# Patient Record
Sex: Female | Born: 1945
Health system: Southern US, Community
[De-identification: ages and names within clinical notes are randomized; demographics above are authoritative.]

## PROBLEM LIST (undated history)

## (undated) DIAGNOSIS — N200 Calculus of kidney: Secondary | ICD-10-CM

## (undated) DIAGNOSIS — Z8679 Personal history of other diseases of the circulatory system: Secondary | ICD-10-CM

## (undated) DIAGNOSIS — I495 Sick sinus syndrome: Secondary | ICD-10-CM

## (undated) DIAGNOSIS — I639 Cerebral infarction, unspecified: Secondary | ICD-10-CM

## (undated) DIAGNOSIS — I10 Essential (primary) hypertension: Secondary | ICD-10-CM

## (undated) DIAGNOSIS — K219 Gastro-esophageal reflux disease without esophagitis: Secondary | ICD-10-CM

## (undated) DIAGNOSIS — E119 Type 2 diabetes mellitus without complications: Secondary | ICD-10-CM

## (undated) DIAGNOSIS — T7840XA Allergy, unspecified, initial encounter: Secondary | ICD-10-CM

## (undated) DIAGNOSIS — D126 Benign neoplasm of colon, unspecified: Secondary | ICD-10-CM

## (undated) DIAGNOSIS — Z91148 Patient's other noncompliance with medication regimen for other reason: Secondary | ICD-10-CM

## (undated) DIAGNOSIS — I5042 Chronic combined systolic (congestive) and diastolic (congestive) heart failure: Secondary | ICD-10-CM

## (undated) DIAGNOSIS — E785 Hyperlipidemia, unspecified: Secondary | ICD-10-CM

## (undated) DIAGNOSIS — Z9114 Patient's other noncompliance with medication regimen: Secondary | ICD-10-CM

## (undated) DIAGNOSIS — I480 Paroxysmal atrial fibrillation: Secondary | ICD-10-CM

## (undated) DIAGNOSIS — E669 Obesity, unspecified: Secondary | ICD-10-CM

## (undated) HISTORY — DX: Hyperlipidemia, unspecified: E78.5

## (undated) HISTORY — PX: CHOLECYSTECTOMY: SHX55

## (undated) HISTORY — PX: INSERT / REPLACE / REMOVE PACEMAKER: SUR710

## (undated) HISTORY — DX: Cerebral infarction, unspecified: I63.9

## (undated) HISTORY — DX: Gastro-esophageal reflux disease without esophagitis: K21.9

## (undated) HISTORY — PX: ENDOMETRIAL ABLATION: SHX621

## (undated) HISTORY — DX: Calculus of kidney: N20.0

## (undated) HISTORY — DX: Benign neoplasm of colon, unspecified: D12.6

## (undated) HISTORY — PX: DESTRUCTION TRIGEMINAL NERVE VIA NEUROLYTIC AGENT: SUR416

## (undated) HISTORY — DX: Allergy, unspecified, initial encounter: T78.40XA

## (undated) HISTORY — DX: Essential (primary) hypertension: I10

## (undated) HISTORY — PX: TUBAL LIGATION: SHX77

## (undated) HISTORY — DX: Personal history of other diseases of the circulatory system: Z86.79

---

## 1998-08-16 ENCOUNTER — Encounter: Payer: Self-pay | Admitting: Family Medicine

## 2001-03-30 ENCOUNTER — Emergency Department (HOSPITAL_COMMUNITY): Admission: EM | Admit: 2001-03-30 | Discharge: 2001-03-30 | Payer: Self-pay | Admitting: Emergency Medicine

## 2001-03-30 ENCOUNTER — Encounter: Payer: Self-pay | Admitting: Emergency Medicine

## 2003-01-05 ENCOUNTER — Encounter: Payer: Self-pay | Admitting: Family Medicine

## 2005-02-18 ENCOUNTER — Other Ambulatory Visit: Payer: Self-pay

## 2005-02-18 ENCOUNTER — Emergency Department: Payer: Self-pay | Admitting: Emergency Medicine

## 2006-02-19 ENCOUNTER — Encounter: Payer: Self-pay | Admitting: Family Medicine

## 2006-05-28 ENCOUNTER — Ambulatory Visit: Payer: Self-pay | Admitting: Oral Surgery

## 2007-09-13 ENCOUNTER — Encounter: Payer: Self-pay | Admitting: Family Medicine

## 2008-04-10 ENCOUNTER — Encounter: Payer: Self-pay | Admitting: Family Medicine

## 2008-05-04 ENCOUNTER — Ambulatory Visit: Payer: Self-pay | Admitting: Family Medicine

## 2008-05-04 DIAGNOSIS — Z780 Asymptomatic menopausal state: Secondary | ICD-10-CM | POA: Insufficient documentation

## 2008-05-04 DIAGNOSIS — I10 Essential (primary) hypertension: Secondary | ICD-10-CM

## 2008-05-04 DIAGNOSIS — J45909 Unspecified asthma, uncomplicated: Secondary | ICD-10-CM | POA: Insufficient documentation

## 2008-05-04 DIAGNOSIS — E1159 Type 2 diabetes mellitus with other circulatory complications: Secondary | ICD-10-CM

## 2008-05-04 DIAGNOSIS — K219 Gastro-esophageal reflux disease without esophagitis: Secondary | ICD-10-CM

## 2008-05-04 DIAGNOSIS — J309 Allergic rhinitis, unspecified: Secondary | ICD-10-CM | POA: Insufficient documentation

## 2008-05-04 DIAGNOSIS — E785 Hyperlipidemia, unspecified: Secondary | ICD-10-CM

## 2008-05-23 DIAGNOSIS — D126 Benign neoplasm of colon, unspecified: Secondary | ICD-10-CM

## 2008-05-23 HISTORY — DX: Benign neoplasm of colon, unspecified: D12.6

## 2008-06-01 ENCOUNTER — Ambulatory Visit: Payer: Self-pay | Admitting: Gastroenterology

## 2008-06-08 ENCOUNTER — Ambulatory Visit: Payer: Self-pay | Admitting: Family Medicine

## 2008-06-08 LAB — CONVERTED CEMR LAB: LDL Cholesterol: 186 mg/dL

## 2008-06-09 LAB — CONVERTED CEMR LAB
ALT: 25 units/L (ref 0–35)
AST: 20 units/L (ref 0–37)
Albumin: 4.1 g/dL (ref 3.5–5.2)
Alkaline Phosphatase: 70 units/L (ref 39–117)
BUN: 16 mg/dL (ref 6–23)
Basophils Absolute: 0 10*3/uL (ref 0.0–0.1)
Basophils Relative: 0.5 % (ref 0.0–3.0)
Bilirubin, Direct: 0 mg/dL (ref 0.0–0.3)
CO2: 29 meq/L (ref 19–32)
Calcium: 9.1 mg/dL (ref 8.4–10.5)
Chloride: 110 meq/L (ref 96–112)
Cholesterol: 262 mg/dL — ABNORMAL HIGH (ref 0–200)
Creatinine, Ser: 0.8 mg/dL (ref 0.4–1.2)
Direct LDL: 186.8 mg/dL
Eosinophils Absolute: 0.1 10*3/uL (ref 0.0–0.7)
Eosinophils Relative: 2.1 % (ref 0.0–5.0)
GFR calc non Af Amer: 77.03 mL/min (ref >60)
Glucose, Bld: 121 mg/dL — ABNORMAL HIGH (ref 70–99)
HCT: 44 % (ref 36.0–46.0)
HDL: 41.7 mg/dL (ref >39.00)
Hemoglobin: 15.2 g/dL — ABNORMAL HIGH (ref 12.0–15.0)
Hgb A1c MFr Bld: 6.5 % (ref 4.6–6.5)
Lymphocytes Relative: 35.2 % (ref 12.0–46.0)
Lymphs Abs: 2 10*3/uL (ref 0.7–4.0)
MCHC: 34.6 g/dL (ref 30.0–36.0)
MCV: 91.1 fL (ref 78.0–100.0)
Monocytes Absolute: 0.4 10*3/uL (ref 0.1–1.0)
Monocytes Relative: 6.9 % (ref 3.0–12.0)
Neutro Abs: 3.3 10*3/uL (ref 1.4–7.7)
Neutrophils Relative %: 55.3 % (ref 43.0–77.0)
Platelets: 231 10*3/uL (ref 150.0–400.0)
Potassium: 4.1 meq/L (ref 3.5–5.1)
RBC: 4.83 M/uL (ref 3.87–5.11)
RDW: 12.1 % (ref 11.5–14.6)
Sodium: 143 meq/L (ref 135–145)
TSH: 1.35 microintl units/mL (ref 0.35–5.50)
Total Bilirubin: 1 mg/dL (ref 0.3–1.2)
Total CHOL/HDL Ratio: 6
Total Protein: 7 g/dL (ref 6.0–8.3)
Triglycerides: 228 mg/dL — ABNORMAL HIGH (ref 0.0–149.0)
VLDL: 45.6 mg/dL — ABNORMAL HIGH (ref 0.0–40.0)
WBC: 5.8 10*3/uL (ref 4.5–10.5)

## 2008-06-15 ENCOUNTER — Ambulatory Visit: Payer: Self-pay | Admitting: Gastroenterology

## 2008-06-15 ENCOUNTER — Encounter: Payer: Self-pay | Admitting: Gastroenterology

## 2008-06-15 LAB — HM COLONOSCOPY

## 2008-06-17 ENCOUNTER — Encounter: Payer: Self-pay | Admitting: Gastroenterology

## 2008-06-29 ENCOUNTER — Ambulatory Visit: Payer: Self-pay | Admitting: Family Medicine

## 2008-06-29 ENCOUNTER — Encounter: Payer: Self-pay | Admitting: Family Medicine

## 2008-07-02 ENCOUNTER — Encounter (INDEPENDENT_AMBULATORY_CARE_PROVIDER_SITE_OTHER): Payer: Self-pay | Admitting: *Deleted

## 2008-07-02 ENCOUNTER — Encounter: Payer: Self-pay | Admitting: Family Medicine

## 2008-07-02 LAB — HM MAMMOGRAPHY

## 2008-08-10 ENCOUNTER — Ambulatory Visit: Payer: Self-pay | Admitting: Family Medicine

## 2008-08-10 LAB — CONVERTED CEMR LAB
Cholesterol, target level: 200 mg/dL
HDL goal, serum: 40 mg/dL
LDL Goal: 100 mg/dL

## 2008-09-03 ENCOUNTER — Telehealth: Payer: Self-pay | Admitting: Family Medicine

## 2009-01-25 ENCOUNTER — Encounter: Payer: Self-pay | Admitting: Family Medicine

## 2009-02-08 ENCOUNTER — Encounter (INDEPENDENT_AMBULATORY_CARE_PROVIDER_SITE_OTHER): Payer: Self-pay | Admitting: *Deleted

## 2009-03-08 ENCOUNTER — Ambulatory Visit: Payer: Self-pay | Admitting: Family Medicine

## 2009-03-08 LAB — CONVERTED CEMR LAB
ALT: 31 units/L (ref 0–35)
AST: 21 units/L (ref 0–37)
Albumin: 4.2 g/dL (ref 3.5–5.2)
Alkaline Phosphatase: 77 units/L (ref 39–117)
BUN: 14 mg/dL (ref 6–23)
Bilirubin, Direct: 0.1 mg/dL (ref 0.0–0.3)
CO2: 28 meq/L (ref 19–32)
Calcium: 9.2 mg/dL (ref 8.4–10.5)
Chloride: 107 meq/L (ref 96–112)
Cholesterol: 245 mg/dL — ABNORMAL HIGH (ref 0–200)
Creatinine, Ser: 0.8 mg/dL (ref 0.4–1.2)
Direct LDL: 167.1 mg/dL
GFR calc non Af Amer: 76.85 mL/min (ref >60)
Glucose, Bld: 142 mg/dL — ABNORMAL HIGH (ref 70–99)
HDL: 48.6 mg/dL (ref >39.00)
Hgb A1c MFr Bld: 6.7 % — ABNORMAL HIGH (ref 4.6–6.5)
Potassium: 4.3 meq/L (ref 3.5–5.1)
Sodium: 141 meq/L (ref 135–145)
Total Bilirubin: 0.5 mg/dL (ref 0.3–1.2)
Total CHOL/HDL Ratio: 5
Total Protein: 6.8 g/dL (ref 6.0–8.3)
Triglycerides: 201 mg/dL — ABNORMAL HIGH (ref 0.0–149.0)
VLDL: 40.2 mg/dL — ABNORMAL HIGH (ref 0.0–40.0)

## 2009-03-15 ENCOUNTER — Ambulatory Visit: Payer: Self-pay | Admitting: Family Medicine

## 2009-03-16 LAB — CONVERTED CEMR LAB: TSH: 1.32 microintl units/mL (ref 0.35–5.50)

## 2009-04-05 ENCOUNTER — Ambulatory Visit: Payer: Self-pay | Admitting: Family Medicine

## 2009-05-31 ENCOUNTER — Encounter: Payer: Self-pay | Admitting: Family Medicine

## 2009-06-17 ENCOUNTER — Ambulatory Visit: Payer: Self-pay | Admitting: Family Medicine

## 2009-09-20 ENCOUNTER — Ambulatory Visit: Payer: Self-pay | Admitting: Family Medicine

## 2009-09-20 LAB — CONVERTED CEMR LAB
ALT: 28 units/L (ref 0–35)
AST: 20 units/L (ref 0–37)
Albumin: 4.3 g/dL (ref 3.5–5.2)
Alkaline Phosphatase: 69 units/L (ref 39–117)
Bilirubin, Direct: 0.1 mg/dL (ref 0.0–0.3)
Cholesterol: 276 mg/dL — ABNORMAL HIGH (ref 0–200)
Creatinine,U: 117.4 mg/dL
Direct LDL: 201.4 mg/dL
HDL: 49.5 mg/dL (ref >39.00)
Hgb A1c MFr Bld: 6.9 % — ABNORMAL HIGH (ref 4.6–6.5)
Microalb Creat Ratio: 1.8 mg/g (ref 0.0–30.0)
Microalb, Ur: 2.1 mg/dL — ABNORMAL HIGH (ref 0.0–1.9)
Total Bilirubin: 0.8 mg/dL (ref 0.3–1.2)
Total CHOL/HDL Ratio: 6
Total Protein: 6.7 g/dL (ref 6.0–8.3)
Triglycerides: 192 mg/dL — ABNORMAL HIGH (ref 0.0–149.0)
VLDL: 38.4 mg/dL (ref 0.0–40.0)

## 2009-10-04 ENCOUNTER — Ambulatory Visit: Payer: Self-pay | Admitting: Family Medicine

## 2010-01-03 ENCOUNTER — Ambulatory Visit: Payer: Self-pay | Admitting: Family Medicine

## 2010-01-10 ENCOUNTER — Ambulatory Visit: Payer: Self-pay | Admitting: Family Medicine

## 2010-01-26 ENCOUNTER — Ambulatory Visit
Admission: RE | Admit: 2010-01-26 | Discharge: 2010-01-26 | Payer: Self-pay | Source: Home / Self Care | Attending: Family Medicine | Admitting: Family Medicine

## 2010-01-26 ENCOUNTER — Other Ambulatory Visit: Payer: Self-pay | Admitting: Family Medicine

## 2010-01-26 DIAGNOSIS — R5383 Other fatigue: Secondary | ICD-10-CM

## 2010-01-26 DIAGNOSIS — R5381 Other malaise: Secondary | ICD-10-CM | POA: Insufficient documentation

## 2010-01-26 DIAGNOSIS — G500 Trigeminal neuralgia: Secondary | ICD-10-CM | POA: Insufficient documentation

## 2010-01-26 LAB — TSH: TSH: 1.42 u[IU]/mL (ref 0.35–5.50)

## 2010-01-26 LAB — BASIC METABOLIC PANEL
BUN: 11 mg/dL (ref 6–23)
CO2: 26 mEq/L (ref 19–32)
Calcium: 10.1 mg/dL (ref 8.4–10.5)
Chloride: 104 mEq/L (ref 96–112)
Creatinine, Ser: 0.8 mg/dL (ref 0.4–1.2)
GFR: 75.54 mL/min (ref >60.00)
Glucose, Bld: 147 mg/dL — ABNORMAL HIGH (ref 70–99)
Potassium: 4.4 mEq/L (ref 3.5–5.1)
Sodium: 140 mEq/L (ref 135–145)

## 2010-01-26 LAB — HEMOGLOBIN A1C: Hgb A1c MFr Bld: 7 % — ABNORMAL HIGH (ref 4.6–6.5)

## 2010-02-09 ENCOUNTER — Ambulatory Visit
Admission: RE | Admit: 2010-02-09 | Discharge: 2010-02-09 | Payer: Self-pay | Source: Home / Self Care | Attending: Family Medicine | Admitting: Family Medicine

## 2010-02-22 NOTE — Assessment & Plan Note (Signed)
Summary: 6 MTH FU/CLE   Vital Signs:  Patient profile:   65 year old female Height:      63 inches Weight:      191.2 pounds BMI:     33.99 Temp:     98.6 degrees F oral Pulse rate:   80 / minute Pulse rhythm:   regular BP sitting:   120 / 78  (left arm) Cuff size:   regular  Vitals Entered By: Benny Lennert CMA Duncan Dull) (March 15, 2009 9:45 AM)  History of Present Illness: Chief complaint 6 month follow  up  HTN at goal  Lipids: never filled meds, unstable, LDL 160  DM: a1c - 6.7 doing ok, no highs or low, no probs overall  some fatigue and sweats  ROS: GEN: No acute illnesses, no fevers, chills,. GI: No n/v/d Pulm: No SOB, cough, wheezing Interactive and getting along well at home.  Otherwise, ROS is as per the HPI.     Allergies: 1)  ! Pcn 2)  ! * Shellfish 3)  ! * Novicain  Past History:  Past medical, surgical, family and social histories (including risk factors) reviewed, and no changes noted (except as noted below).  Past Medical History: Reviewed history from 05/12/2008 and no changes required. Allergic rhinitis Asthma Diabetes mellitus, type II (borderline) GERD Hyperlipidemia Hypertension Heart murmur Kidney stones Rheumatic Rever Ectopic Pregnancy, 1980's, R TMJ  Past Surgical History: Reviewed history from 05/04/2008 and no changes required. Gall Bladder (2000) Fallopian Tube, R, removal, 1980's  Family History: Reviewed history from 05/04/2008 and no changes required. Family History Diabetes 1st degree relative (parent, gransparent) Family History Hypertension (parent) Family History of Stroke F 1st degree relative (mother) M, CVA, d/c at 36 GM, CVA Father alive, 46, no contact  Social History: Reviewed history from 05/04/2008 and no changes required. Occupation: self employed Set designer Married, 2 children, 3 grandchildren Never Smoked Alcohol use-yes Drug use-no Regular exercise-no  Physical Exam  Additional  Exam:  GEN: WDWN, NAD, Non-toxic, A & O x 3 HEENT: Atraumatic, Normocephalic. Neck supple. No masses, No LAD. Ears and Nose: No external deformity. CV: RRR, No M/G/R. No JVD. No thrill. No extra heart sounds. PULM: CTA B, no wheezes, crackles, rhonchi. No retractions. No resp. distress. No accessory muscle use. EXTR: No c/c/e NEURO: Normal gait.  PSYCH: Normally interactive. Conversant. Not depressed or anxious appearing.  Calm demeanor.     Impression & Recommendations:  Problem # 1:  DIABETES MELLITUS, TYPE II (ICD-250.00) Assessment Unchanged  Labs Reviewed: Creat: 0.8 (03/08/2009)    Reviewed HgBA1c results: 6.7 (03/08/2009)  6.5 (06/08/2008)  Problem # 2:  HYPERLIPIDEMIA (ICD-272.4) Assessment: Deteriorated will start low dose pravachol  Her updated medication list for this problem includes:    Pravastatin Sodium 10 Mg Tabs (Pravastatin sodium) ..... One by mouth at bedtime  Labs Reviewed: SGOT: 21 (03/08/2009)   SGPT: 31 (03/08/2009)  Lipid Goals: Chol Goal: 200 (08/10/2008)   HDL Goal: 40 (08/10/2008)   LDL Goal: 100 (08/10/2008)   TG Goal: 150 (08/10/2008)  Prior 10 Yr Risk Heart Disease: > 32 % (08/10/2008)   HDL:48.60 (03/08/2009), 41.70 (06/08/2008)  LDL:186 (06/08/2008)  Chol:245 (03/08/2009), 262 (06/08/2008)  Trig:201.0 (03/08/2009), 228.0 (06/08/2008)  Problem # 3:  HYPERTENSION (ICD-401.9) at goal  Her updated medication list for this problem includes:    Toprol Xl 25 Mg Xr24h-tab (Metoprolol succinate) .Marland Kitchen... 1 by mouth daily  Problem # 4:  OTHER MALAISE AND FATIGUE (ICD-780.79)  Orders: Venipuncture ([address]) TLB-TSH (Thyroid  Stimulating Hormone) (84443-TSH)  Complete Medication List: 1)  Nexium 40 Mg Cpdr (Esomeprazole magnesium) .... Take 1 tablet by mouth once a day 2)  Toprol Xl 25 Mg Xr24h-tab (Metoprolol succinate) .Marland Kitchen.. 1 by mouth daily 3)  Singulair 10 Mg Tabs (Montelukast sodium) .... Take 1 tablet by mouth once a day 4)  Flexeril 10 Mg Tabs  (Cyclobenzaprine hcl) .Marland Kitchen.. 1 by mouth three times a day as needed muscle spasm 5)  Librax 2.5-5 Mg Caps (Clidinium-chlordiazepoxide) .Marland Kitchen.. 1 by mouth q am as needed 6)  Allegra 180 Mg Tabs (Fexofenadine hcl) .Marland Kitchen.. 1 by mouth daily (failed zyrtec and claritin) 7)  Proair Hfa 108 (90 Base) Mcg/act Aers (Albuterol sulfate) .... 2 inh q4h as needed shortness of breath 8)  Pravastatin Sodium 10 Mg Tabs (Pravastatin sodium) .... One by mouth at bedtime  Patient Instructions: 1)  f/u 6 months 2)  FLP, HFP, Hga1c, urine microalbumin before 3-4 days: 250.00, 272.4 Prescriptions: PRAVASTATIN SODIUM 10 MG  TABS (PRAVASTATIN SODIUM) one by mouth at bedtime  #30 x 11   Entered and Authorized by:   [name] MD   Signed by:   [name] MD on 03/15/2009   Method used:   Print then Give to Patient   RxID:   [id number]   Current Allergies (reviewed today): ! PCN ! * SHELLFISH ! * NOVICAIN

## 2010-02-22 NOTE — Assessment & Plan Note (Signed)
Summary: URI   Vital Signs:  Patient profile:   65 year old female Height:      63 inches Weight:      195.0 pounds BMI:     34.67 Temp:     98.3 degrees F oral Pulse rate:   80 / minute Pulse rhythm:   regular BP sitting:   140 / 80  (left arm) Cuff size:   regular  Vitals Entered By: Benny Lennert CMA Duncan Dull) (April 05, 2009 2:44 PM)  History of Present Illness: Chief complaint ?uri  65 year old:  Doing better now, last week was feeling something going on. Took some mucinex, has been getting better. Was hurting in chest, back, joints.   Had some temp.   Acute Visit History:      The patient complains of chest pain, cough, fever, headache, musculoskeletal symptoms, nasal discharge, sinus problems, and sore throat.  She denies abdominal pain, constipation, and vomiting.        The cough interferes with her sleep.  The character of the cough is described as productive.        She complains of sinus pressure and nasal congestion.  The patient has had a past history of sinusitis.        Urine output has been normal.  She is tolerating clear liquids.        Allergies: 1)  ! Pcn 2)  ! * Shellfish 3)  ! * Novicain  Past History:  Past medical, surgical, family and social histories (including risk factors) reviewed, and no changes noted (except as noted below).  Past Medical History: Reviewed history from 05/12/2008 and no changes required. Allergic rhinitis Asthma Diabetes mellitus, type II (borderline) GERD Hyperlipidemia Hypertension Heart murmur Kidney stones Rheumatic Rever Ectopic Pregnancy, 1980's, R TMJ  Past Surgical History: Reviewed history from 05/04/2008 and no changes required. Gall Bladder (2000) Fallopian Tube, R, removal, 1980's  Family History: Reviewed history from 05/04/2008 and no changes required. Family History Diabetes 1st degree relative (parent, gransparent) Family History Hypertension (parent) Family History of Stroke F 1st degree  relative (mother) M, CVA, d/c at 26 GM, CVA Father alive, 60, no contact  Social History: Reviewed history from 05/04/2008 and no changes required. Occupation: self employed Set designer Married, 2 children, 3 grandchildren Never Smoked Alcohol use-yes Drug use-no Regular exercise-no  Review of Systems       REVIEW OF SYSTEMS GEN: Acute illness details above. CV: No chest pain or SOB GI: No noted N or V Otherwise, pertinent positives and negatives are noted in the HPI.   Physical Exam  Additional Exam:  Gen: WDWN, NAD; A & O x3, cooperative. Pleasant.Globally Non-toxic HEENT: Normocephalic and atraumatic. Throat clear, w/o exudate, R TM clear, L TM - good landmarks, No fluid present. rhinnorhea. No frontal or maxillary sinus T. MMM NECK: Anterior cervical  LAD is present CV: RRR, No M/G/R, cap refill <2 sec PULM: Breathing comfortably in no respiratory distress. no wheezing, crackles, rhonchi ABD: S,NT,ND,+BS. No HSM. No rebound. EXT: No c/c/e PSYCH: Friendly, good eye contact MSK: Nml gait    Impression & Recommendations:  Problem # 1:  INFLUENZA WITH OTHER RESPIRATORY MANIFESTATIONS (ICD-487.1) Assessment New The patient's clinical exam and history is consistent with a diagnosis of influenza, resolving  Supportive care. Fluids. Cough medicines as needed  Anti-pyretics. Detailed in p/i  Infection control emphasized, including OOW or school until AF 24 hours.   Problem # 2:  COUGH (ICD-786.2) Assessment: New Tussionex as needed  at night  Complete Medication List: 1)  Nexium 40 Mg Cpdr (Esomeprazole magnesium) .... Take 1 tablet by mouth once a day 2)  Toprol Xl 25 Mg Xr24h-tab (Metoprolol succinate) .Marland Kitchen.. 1 by mouth daily 3)  Singulair 10 Mg Tabs (Montelukast sodium) .... Take 1 tablet by mouth once a day 4)  Flexeril 10 Mg Tabs (Cyclobenzaprine hcl) .Marland Kitchen.. 1 by mouth three times a day as needed muscle spasm 5)  Librax 2.5-5 Mg Caps (Clidinium-chlordiazepoxide)  .Marland Kitchen.. 1 by mouth q am as needed 6)  Allegra 180 Mg Tabs (Fexofenadine hcl) .Marland Kitchen.. 1 by mouth daily (failed zyrtec and claritin) 7)  Proair Hfa 108 (90 Base) Mcg/act Aers (Albuterol sulfate) .... 2 inh q4h as needed shortness of breath 8)  Pravastatin Sodium 10 Mg Tabs (Pravastatin sodium) .... One by mouth at bedtime 9)  Tussionex Pennkinetic Er 8-10 Mg/75ml Lqcr (Chlorpheniramine-hydrocodone) .Marland Kitchen.. 1 tsp by mouth at bedtime as needed cough Prescriptions: TUSSIONEX PENNKINETIC ER 8-10 MG/5ML LQCR (CHLORPHENIRAMINE-HYDROCODONE) 1 tsp by mouth at bedtime as needed cough  #240 mL x 0   Entered and Authorized by:   [name] MD   Signed by:   [name] MD on 04/05/2009   Method used:   Print then Give to Patient   RxID:   [id number]   Current Allergies (reviewed today): ! PCN ! * SHELLFISH ! * NOVICAIN

## 2010-02-22 NOTE — Letter (Signed)
Summary: [facility]  [facility]   Imported By: Lanelle Bal 01/27/2009 10:00:04  _____________________________________________________________________  External Attachment:    Type:   Image     Comment:   External Document

## 2010-02-22 NOTE — Assessment & Plan Note (Signed)
Summary: 6 Month follow-u[   Vital Signs:  Patient profile:   65 year old female Weight:      190 pounds Temp:     98.7 degrees F oral Pulse rate:   84 / minute Pulse rhythm:   regular BP sitting:   136 / 70  (left arm) Cuff size:   large  Vitals Entered By: Sydell Axon LPN (October 04, 2009 11:48 AM) CC: 6 Month follow-up   History of Present Illness: 65 year old female:  HTN, has been stable, no difficulty with any medications.  Lipids, notably hyperlipidemic with a total cholesterol 275 and an LDL of 200. The patient also does have diabetes. She has not tolerated Lipitor or Zocor in the past. She was reluctant to starting Statin. She currently is on TriCor. She does not follow a cholesterol diet very well.  DMreluctant to go on any medications at this point. A1c is now 6.9.  GYN, [facility]  Clinical Review Panels:  Prevention   Last Mammogram:  normal (06/24/2008)   Last Colonoscopy:  Location:  [facility].  (06/15/2008)  Lipid Management   Cholesterol:  276 (09/20/2009)   LDL (bad choesterol):  186 (06/08/2008)   HDL (good cholesterol):  49.50 (09/20/2009)  Diabetes Management   HgBA1C:  6.9 (09/20/2009)   Creatinine:  0.8 (03/08/2009)  CBC   WBC:  5.8 (06/08/2008)   RBC:  4.83 (06/08/2008)   Hgb:  15.2 (06/08/2008)   Hct:  44.0 (06/08/2008)   Platelets:  231.0 (06/08/2008)   MCV  91.1 (06/08/2008)   MCHC  34.6 (06/08/2008)   RDW  12.1 (06/08/2008)   PMN:  55.3 (06/08/2008)   Lymphs:  35.2 (06/08/2008)   Monos:  6.9 (06/08/2008)   Eosinophils:  2.1 (06/08/2008)   Basophil:  0.5 (06/08/2008)  Complete Metabolic Panel   Glucose:  142 (03/08/2009)   Sodium:  141 (03/08/2009)   Potassium:  4.3 (03/08/2009)   Chloride:  107 (03/08/2009)   CO2:  28 (03/08/2009)   BUN:  14 (03/08/2009)   Creatinine:  0.8 (03/08/2009)   Albumin:  4.3 (09/20/2009)   Total Protein:  6.7 (09/20/2009)   Calcium:  9.2 (03/08/2009)   Total Bili:  0.8  (09/20/2009)   Alk Phos:  69 (09/20/2009)   SGPT (ALT):  28 (09/20/2009)   SGOT (AST):  20 (09/20/2009)   Allergies: 1)  ! Pcn 2)  ! * Shellfish 3)  ! * Novicain  Past History:  Past medical, surgical, family and social histories (including risk factors) reviewed, and no changes noted (except as noted below).  Past Medical History: Reviewed history from 05/12/2008 and no changes required. Allergic rhinitis Asthma Diabetes mellitus, type II (borderline) GERD Hyperlipidemia Hypertension Heart murmur Kidney stones Rheumatic Rever Ectopic Pregnancy, 1980's, R TMJ  Past Surgical History: Reviewed history from 05/04/2008 and no changes required. Gall Bladder (2000) Fallopian Tube, R, removal, 1980's  Family History: Reviewed history from 05/04/2008 and no changes required. Family History Diabetes 1st degree relative (parent, gransparent) Family History Hypertension (parent) Family History of Stroke F 1st degree relative (mother) M, CVA, d/c at 41 GM, CVA Father alive, 24, no contact  Social History: Reviewed history from 05/04/2008 and no changes required. Occupation: self employed Set designer Married, 2 children, 3 grandchildren Never Smoked Alcohol use-yes Drug use-no Regular exercise-no  Review of Systems       REVIEW OF SYSTEMS GEN: No acute illnesses, no fever, chills, sweats. continues to have intermittent, chronic left-sided jaw pain, and she  is seeing multiple specialists include dental, oral surgery, ENT, neurology. CV: No chest pain or SOB GI: No noted N or V Otherwise, pertinent positives and negatives are noted in the HPI.   Physical Exam  Additional Exam:  GEN: WDWN, NAD, Non-toxic, A & O x 3 HEENT: Atraumatic, Normocephalic. Neck supple. No masses, No LAD. Ears and Nose: No external deformity. CV: RRR, No M/G/R. No JVD. No thrill. No extra heart sounds. PULM: CTA B, no wheezes, crackles, rhonchi. No retractions. No resp. distress. No  accessory muscle use. EXTR: No c/c/e NEURO: Normal gait.  PSYCH: Normally interactive. Conversant. Not depressed or anxious appearing.  Calm demeanor.     Impression & Recommendations:  Problem # 1:  HYPERLIPIDEMIA (ICD-272.4) Assessment Deteriorated  Her updated medication list for this problem includes:    Pravastatin Sodium 10 Mg Tabs (Pravastatin sodium) ..... One by mouth at bedtime  Labs Reviewed: SGOT: 20 (09/20/2009)   SGPT: 28 (09/20/2009)  Lipid Goals: Chol Goal: 200 (08/10/2008)   HDL Goal: 40 (08/10/2008)   LDL Goal: 100 (08/10/2008)   TG Goal: 150 (08/10/2008)  Prior 10 Yr Risk Heart Disease: > 32 % (08/10/2008)   HDL:49.50 (09/20/2009), 48.60 (03/08/2009)  LDL:186 (06/08/2008)  Chol:276 (09/20/2009), 245 (03/08/2009)  Trig:192.0 (09/20/2009), 201.0 (03/08/2009)  Problem # 2:  HYPERTENSION (ICD-401.9) Assessment: Unchanged  Her updated medication list for this problem includes:    Toprol Xl 25 Mg Xr24h-tab (Metoprolol succinate) .Marland Kitchen... 1 by mouth daily  Problem # 3:  DIABETES MELLITUS, TYPE II (ICD-250.00) Assessment: Unchanged averse to meds  Labs Reviewed: Creat: 0.8 (03/08/2009)    Reviewed HgBA1c results: 6.9 (09/20/2009)  6.7 (03/08/2009)  Problem # 4:  TMJ SYNDROME (ICD-524.60) chronic, multiple specialists involed. c/w orajel and specialty directed care.  Complete Medication List: 1)  Nexium 40 Mg Cpdr (Esomeprazole magnesium) .... Take 1 tablet by mouth once a day 2)  Toprol Xl 25 Mg Xr24h-tab (Metoprolol succinate) .Marland Kitchen.. 1 by mouth daily 3)  Singulair 10 Mg Tabs (Montelukast sodium) .... Take 1 tablet by mouth once a day 4)  Flexeril 10 Mg Tabs (Cyclobenzaprine hcl) .Marland Kitchen.. 1 by mouth three times a day as needed muscle spasm 5)  Librax 2.5-5 Mg Caps (Clidinium-chlordiazepoxide) .Marland Kitchen.. 1 by mouth q am as needed 6)  Allegra 180 Mg Tabs (Fexofenadine hcl) .Marland Kitchen.. 1 by mouth daily (failed zyrtec and claritin) 7)  Proair Hfa 108 (90 Base) Mcg/act Aers (Albuterol  sulfate) .... 2 inh q4h as needed shortness of breath 8)  Pravastatin Sodium 10 Mg Tabs (Pravastatin sodium) .... One by mouth at bedtime 9)  Trileptal 150 Mg Tabs (Oxcarbazepine) .... Take 2 by mouth two times a day  Patient Instructions: 1)  RED YEAST RICE 2)  F/U 3 months, check 3)  FLP, HFP: 272.4 Prescriptions: LIBRAX 2.5-5 MG CAPS (CLIDINIUM-CHLORDIAZEPOXIDE) 1 by mouth q am as needed  #30 x 4   Entered and Authorized by:   [name] MD   Signed by:   [name] MD on 10/04/2009   Method used:   Print then Give to Patient   RxID:   [id number]   Current Allergies (reviewed today): ! PCN ! * SHELLFISH ! * NOVICAIN

## 2010-02-22 NOTE — Assessment & Plan Note (Signed)
Summary: JAW PAIN/CLE   Vital Signs:  Patient profile:   65 year old female Height:      63 inches Weight:      193.4 pounds BMI:     34.38 Temp:     98.3 degrees F oral Pulse rate:   80 / minute Pulse rhythm:   regular BP sitting:   150 / 80  (left arm) Cuff size:   large  Vitals Entered By: Benny Lennert CMA Duncan Dull) (Jun 17, 2009 12:31 PM)  History of Present Illness: Chief complaint jaw pain  L upper facial pain  entire chart reviewed multiple meds reviewed.  Patient has a chronic left upper jaw pain and left upper facial pain. She has seen multiple specialists, she is seen multiple ENTs and neurologist. She is also seen multiple dentist and oral surgeon.  She is here today in consultation for chronic left upper facial pain. She does have some degree of sinus symptoms today. She is having a lot of nasal drainage  and she has pain in the jaw and maxillary facial region. Some pain behind her left eye as well.  No fever, chills, sweats.  Review of systems: As above, no fever.  Allergic rhinitis has been flared. No nausea vomiting or diarrhea.  Current Problems (verified): 1)  Tmj Syndrome  (ICD-524.60) 2)  Chronic Maxillary Sinusitis  (ICD-473.0) 3)  Encounter For Long-term Use of Other Medications  (ICD-V58.69) 4)  Other Malaise and Fatigue  (ICD-780.79) 5)  Other Screening Mammogram  (ICD-V76.12) 6)  Postmenopausal Status  (ICD-V49.81) 7)  Hypertension  (ICD-401.9) 8)  Hyperlipidemia  (ICD-272.4) 9)  Gerd  (ICD-530.81) 10)  Diabetes Mellitus, Type II  (ICD-250.00) 11)  Asthma  (ICD-493.90) 12)  Allergic Rhinitis  (ICD-477.9)  Allergies: 1)  ! Pcn 2)  ! * Shellfish 3)  ! * Novicain  Past History:  Past medical, surgical, family and social histories (including risk factors) reviewed, and no changes noted (except as noted below).  Past Medical History: Reviewed history from 05/12/2008 and no changes required. Allergic rhinitis Asthma Diabetes mellitus, type  II (borderline) GERD Hyperlipidemia Hypertension Heart murmur Kidney stones Rheumatic Rever Ectopic Pregnancy, 1980's, R TMJ  Past Surgical History: Reviewed history from 05/04/2008 and no changes required. Gall Bladder (2000) Fallopian Tube, R, removal, 1980's  Family History: Reviewed history from 05/04/2008 and no changes required. Family History Diabetes 1st degree relative (parent, gransparent) Family History Hypertension (parent) Family History of Stroke F 1st degree relative (mother) M, CVA, d/c at 40 GM, CVA Father alive, 47, no contact  Social History: Reviewed history from 05/04/2008 and no changes required. Occupation: self employed Set designer Married, 2 children, 3 grandchildren Never Smoked Alcohol use-yes Drug use-no Regular exercise-no  Physical Exam  General:  Well-developed,well-nourished,in no acute distress; alert,appropriate and cooperative throughout examination Head:  Normocephalic and atraumatic without obvious abnormalities.  L maxillary sinus tenderness Psych:  Cognition and judgment appear intact. Alert and cooperative with normal attention span and concentration. No apparent delusions, illusions, hallucinations   ENT Exam:  Left Ear:     External: Pinna and periauricular area is normal.       Canal: Ear canal skin is not inflamed or edematous.       Tympanic Membrane: serous fluid Right Ear:     External: Pinna and periauricular area is normal.       Canal: Ear canal skin is not inflamed or edematous.       Tympanic Membrane: serous fluid Nose:  External: No deformity or lesions noted.       Septum: Midline and intact.   Pharynx:     Oral Cavity: Lips, gums, and teeth are unremarkable. Tongue is mobile.      Oropharynx: Pharynx without signs of inflammation, tonsils unremarkable, palate with bilateral rise.  Neck:     Salivary Glands: Soft and symmetric bilaterally.       Lymph Nodes: no palpable lymphadenopathy. Lungs:      Symmetric chest wall rise, no labored breathing.   Heart:     Regular rate and rhythm, S1, S2 without murmurs, rubs, gallops, or clicks.     Impression & Recommendations:  Problem # 1:  CHRONIC MAXILLARY SINUSITIS (ICD-473.0) Assessment New complex situation, certainly does have a component of TMJ problems and dysfunction. Today, she describes symptoms that can often go along with maxillary sinusitis as well and she certainly has sinus tenderness. I would like to treat her for sinusitis, and see how she improves  doing this and other things for allergy control  If this does improve, then pointing her to ENT I think is most appropriate before further dental visits [facility] may be of help in this difficult TMJ case ?  The following medications were removed from the medication list:    Tussionex Pennkinetic Er 8-10 Mg/76ml Lqcr (Chlorpheniramine-hydrocodone) .Marland Kitchen... 1 tsp by mouth at bedtime as needed cough Her updated medication list for this problem includes:    Levaquin 500 Mg Tabs (Levofloxacin) .Marland Kitchen... 1 by mouth daily  Problem # 2:  TMJ SYNDROME (ICD-524.60) Assessment: New  Complete Medication List: 1)  Nexium 40 Mg Cpdr (Esomeprazole magnesium) .... Take 1 tablet by mouth once a day 2)  Toprol Xl 25 Mg Xr24h-tab (Metoprolol succinate) .Marland Kitchen.. 1 by mouth daily 3)  Singulair 10 Mg Tabs (Montelukast sodium) .... Take 1 tablet by mouth once a day 4)  Flexeril 10 Mg Tabs (Cyclobenzaprine hcl) .Marland Kitchen.. 1 by mouth three times a day as needed muscle spasm 5)  Librax 2.5-5 Mg Caps (Clidinium-chlordiazepoxide) .Marland Kitchen.. 1 by mouth q am as needed 6)  Allegra 180 Mg Tabs (Fexofenadine hcl) .Marland Kitchen.. 1 by mouth daily (failed zyrtec and claritin) 7)  Proair Hfa 108 (90 Base) Mcg/act Aers (Albuterol sulfate) .... 2 inh q4h as needed shortness of breath 8)  Pravastatin Sodium 10 Mg Tabs (Pravastatin sodium) .... One by mouth at bedtime 9)  Levaquin 500 Mg Tabs (Levofloxacin) .Marland Kitchen.. 1 by mouth  daily Prescriptions: LEVAQUIN 500 MG TABS (LEVOFLOXACIN) 1 by mouth daily  #14 x 0   Entered and Authorized by:   [name] MD   Signed by:   [name] MD on 06/17/2009   Method used:   Electronically to        [facility]. [address] [phone]* (retail)       [address] [place], [place]  [id number]       Ph: [phone]       Fax: [phone]   RxID:   [id number]   Current Allergies (reviewed today): ! PCN ! * SHELLFISH ! * NOVICAIN

## 2010-02-22 NOTE — Letter (Signed)
Summary: [facility]  [facility]   Imported By: Lanelle Bal 06/04/2009 12:01:13  _____________________________________________________________________  External Attachment:    Type:   Image     Comment:   External Document

## 2010-02-22 NOTE — Letter (Signed)
Summary: [place] No Show Letter  [place] [facility]  [address] [place], [place] [address]   Phone: [phone]  Fax: [phone]    02/08/2009 MRN: [id number]  [facility] [address] [place], [place]  [address]   Dear Ms. Weide,   Our records indicate that you missed your scheduled appointment with ______lab_______________ on __1.17.11__________.  Please contact this office to reschedule your appointment as soon as possible.  It is important that you keep your scheduled appointments with your physician, so we can provide you the best care possible.  Please be advised that there may be a charge for "no show" appointments.    Sincerely,   [facility]

## 2010-02-24 NOTE — Assessment & Plan Note (Signed)
Summary: BP elevated/alc   Vital Signs:  Patient profile:   65 year old female Height:      63 inches Weight:      188.50 pounds BMI:     33.51 Temp:     98.7 degrees F oral Pulse rate:   84 / minute Pulse rhythm:   regular BP sitting:   150 / 90  (left arm) Cuff size:   large  Vitals Entered By: Benny Lennert CMA Duncan Dull) (January 26, 2010 10:39 AM)  History of Present Illness: Chief complaint bp elevated  Uncontrolled BP:  200 systolic.    Diabetes Management History:      The patient is a 65 years old female who comes in for evaluation of DM Type 2.  She has not been enrolled in the "Diabetic Education Program".  She states lack of understanding of dietary principles and is not following her diet appropriately.  No sensory loss is reported.  Self foot exams are not being performed.  She is not checking home blood sugars.  She says that she is not exercising regularly.        Other comments include: non-compliant, will not start medications.    Hypertension History:      She complains of headache and neurologic problems, but denies chest pain, palpitations, dyspnea with exertion, orthopnea, PND, peripheral edema, visual symptoms, syncope, and side effects from treatment.  She notes no problems with any antihypertensive medication side effects.  Further comments include: trigeminal neuralgia.        Positive major cardiovascular risk factors include female age 57 years old or older, diabetes, hyperlipidemia, and hypertension.  Negative major cardiovascular risk factors include negative family history for ischemic heart disease and non-tobacco-user status.        Further assessment for target organ damage reveals no history of ASHD, cardiac end-organ damage (CHF/LVH), stroke/TIA, peripheral vascular disease, renal insufficiency, or hypertensive retinopathy.    Current Problems (verified): 1)  Neuralgia, Trigeminal  (ICD-350.1) 2)  Fatigue  (ICD-780.79) 3)  Encounter For  Long-term Use of Other Medications  (ICD-V58.69) 4)  Other Screening Mammogram  (ICD-V76.12) 5)  Postmenopausal Status  (ICD-V49.81) 6)  Hypertension  (ICD-401.9) 7)  Hyperlipidemia  (ICD-272.4) 8)  Gerd  (ICD-530.81) 9)  Diabetes Mellitus, Type II  (ICD-250.00) 10)  Asthma  (ICD-493.90) 11)  Allergic Rhinitis  (ICD-477.9)  Allergies: 1)  ! Pcn 2)  ! * Shellfish 3)  ! * Novicain  Past History:  Past medical, surgical, family and social histories (including risk factors) reviewed, and no changes noted (except as noted below).  Past Medical History: Reviewed history from 05/12/2008 and no changes required. Allergic rhinitis Asthma Diabetes mellitus, type II (borderline) GERD Hyperlipidemia Hypertension Heart murmur Kidney stones Rheumatic Rever Ectopic Pregnancy, 1980's, R TMJ  Past Surgical History: Reviewed history from 05/04/2008 and no changes required. Gall Bladder (2000) Fallopian Tube, R, removal, 1980's  Family History: Reviewed history from 05/04/2008 and no changes required. Family History Diabetes 1st degree relative (parent, gransparent) Family History Hypertension (parent) Family History of Stroke F 1st degree relative (mother) M, CVA, d/c at 13 GM, CVA Father alive, 41, no contact  Social History: Reviewed history from 05/04/2008 and no changes required. Occupation: self employed Set designer Married, 2 children, 3 grandchildren Never Smoked Alcohol use-yes Drug use-no Regular exercise-no  Review of Systems       REVIEW OF SYSTEMS GEN: Acute illness details above. continues with trigeminal neuralgia - on Lyrica. CV: No chest pain  or SOB GI: No noted N or V Otherwise, pertinent positives and negatives are noted in the HPI.   Physical Exam  General:  Well-developed,well-nourished,in no acute distress; alert,appropriate and cooperative throughout examination Head:  Normocephalic and atraumatic without obvious abnormalities. No apparent  alopecia or balding.  sinus NT Eyes:  No corneal or conjunctival inflammation noted. EOMI. Perrla. Funduscopic exam benign, without hemorrhages, exudates or papilledema. Vision grossly normal. Ears:  serous fluid B TM Nose:  External nasal examination shows no deformity or inflammation. Nasal mucosa are pink and moist without lesions or exudates. Mouth:  Oral mucosa and oropharynx without lesions or exudates.  Teeth in good repair. Neck:  No deformities, masses, or tenderness noted. Lungs:  Normal respiratory effort, chest expands symmetrically. Lungs are clear to auscultation, no crackles or wheezes. Heart:  Normal rate and regular rhythm. S1 and S2 normal without gallop, murmur, click, rub or other extra sounds. Cervical Nodes:  No lymphadenopathy noted Psych:  Cognition and judgment appear intact. Alert and cooperative with normal attention span and concentration. No apparent delusions, illusions, hallucinations   Impression & Recommendations:  Problem # 1:  HYPERTENSION (ICD-401.9) Assessment Deteriorated add ace, close f/u  Her updated medication list for this problem includes:    Toprol Xl 25 Mg Xr24h-tab (Metoprolol succinate) .Marland Kitchen... 1 by mouth daily    Lisinopril 10 Mg Tabs (Lisinopril) .Marland Kitchen... 1 by mouth at night  Problem # 2:  DIABETES MELLITUS, TYPE II (ICD-250.00) Assessment: Deteriorated check labs encouraged consideration of treatment.  Her updated medication list for this problem includes:    Lisinopril 10 Mg Tabs (Lisinopril) .Marland Kitchen... 1 by mouth at night  Orders: Venipuncture (16109) TLB-BMP (Basic Metabolic Panel-BMET) (80048-METABOL) TLB-TSH (Thyroid Stimulating Hormone) (84443-TSH) TLB-A1C / Hgb A1C (Glycohemoglobin) (83036-A1C)  Complete Medication List: 1)  Nexium 40 Mg Cpdr (Esomeprazole magnesium) .... Take 1 tablet by mouth once a day 2)  Toprol Xl 25 Mg Xr24h-tab (Metoprolol succinate) .Marland Kitchen.. 1 by mouth daily 3)  Singulair 10 Mg Tabs (Montelukast sodium) ....  Take 1 tablet by mouth once a day 4)  Flexeril 10 Mg Tabs (Cyclobenzaprine hcl) .Marland Kitchen.. 1 by mouth three times a day as needed muscle spasm 5)  Allegra 180 Mg Tabs (Fexofenadine hcl) .Marland Kitchen.. 1 by mouth daily (failed zyrtec and claritin) 6)  Lisinopril 10 Mg Tabs (Lisinopril) .Marland Kitchen.. 1 by mouth at night  Diabetes Management Assessment/Plan:      The following lipid goals have been established for the patient: Total cholesterol goal of 200; LDL cholesterol goal of 100; HDL cholesterol goal of 40; Triglyceride goal of 150.    Hypertension Assessment/Plan:      The patient's hypertensive risk group is category C: Target organ damage and/or diabetes.  Her calculated 10 year risk of coronary heart disease is > 32 %.  Today's blood pressure is 150/90.    Patient Instructions: 1)  recheck 10-14 days (requests on a Monday) Prescriptions: LISINOPRIL 10 MG TABS (LISINOPRIL) 1 by mouth at night  #30 x 3   Entered and Authorized by:   [name] MD   Signed by:   [name] MD on 01/26/2010   Method used:   Electronically to        [facility]. [address] [phone]* (retail)       [address] [place], [place]  [id number]       Ph: [phone]       Fax: [phone]   RxID:  [id number]    Orders Added: 1)  Venipuncture [36415] 2)  TLB-BMP (Basic Metabolic Panel-BMET) [80048-METABOL] 3)  TLB-TSH (Thyroid Stimulating Hormone) [84443-TSH] 4)  TLB-A1C / Hgb A1C (Glycohemoglobin) [83036-A1C] 5)  Est. Patient Level IV [13086]    Current Allergies (reviewed today): ! PCN ! * SHELLFISH ! * NOVICAIN

## 2010-02-24 NOTE — Assessment & Plan Note (Signed)
Summary: 14 DAY FOLLOW UP / LFW   Vital Signs:  Patient profile:   65 year old female Height:      63 inches Weight:      195 pounds BMI:     34.67 Temp:     98.5 degrees F oral Pulse rate:   84 / minute Pulse rhythm:   regular BP sitting:   130 / 88  (left arm) Cuff size:   large  Vitals Entered By: Benny Lennert CMA Duncan Dull) (February 09, 2010 8:14 AM)   History of Present Illness: Chief complaint 14 day follow up blood pressure  62 year old:  BP: Much stress, with friend social situation.  Still not at goal of 130/80  REVIEW OF SYSTEMS GEN: No acute illnesses, no fever, chills, sweats. cont trigeminal neuralgia c/o CV: No chest pain or SOB GI: No noted N or V Otherwise, pertinent positives and negatives are noted in the HPI.   GEN: Well-developed,well-nourished,in no acute distress; alert,appropriate and cooperative throughout examination HEENT: Normocephalic and atraumatic without obvious abnormalities. No apparent alopecia or balding. Ears, externally no deformities PULM: Breathing comfortably in no respiratory distress EXT: No clubbing, cyanosis, or edema PSYCH: Normally interactive. Cooperative during the interview. Pleasant. Friendly and conversant. Not anxious or depressed appearing. Normal, full affect.    Allergies: 1)  ! Pcn 2)  ! * Shellfish 3)  ! * Novicain   Impression & Recommendations:  Problem # 1:  HYPERTENSION (ICD-401.9) Assessment Improved increase toprol  Her updated medication list for this problem includes:    Metoprolol Succinate 50 Mg Xr24h-tab (Metoprolol succinate) .Marland Kitchen... 1 by mouth daily    Lisinopril 10 Mg Tabs (Lisinopril) .Marland Kitchen... 1 by mouth at night  Complete Medication List: 1)  Nexium 40 Mg Cpdr (Esomeprazole magnesium) .... Take 1 tablet by mouth once a day 2)  Metoprolol Succinate 50 Mg Xr24h-tab (Metoprolol succinate) .Marland Kitchen.. 1 by mouth daily 3)  Singulair 10 Mg Tabs (Montelukast sodium) .... Take 1 tablet by mouth once a  day 4)  Flexeril 10 Mg Tabs (Cyclobenzaprine hcl) .Marland Kitchen.. 1 by mouth three times a day as needed muscle spasm 5)  Allegra 180 Mg Tabs (Fexofenadine hcl) .Marland Kitchen.. 1 by mouth daily (failed zyrtec and claritin) 6)  Lisinopril 10 Mg Tabs (Lisinopril) .Marland Kitchen.. 1 by mouth at night  Patient Instructions: 1)  recheck 3 months Prescriptions: METOPROLOL SUCCINATE 50 MG XR24H-TAB (METOPROLOL SUCCINATE) 1 by mouth daily  #30 x 5   Entered and Authorized by:   [name] MD   Signed by:   [name] MD on 02/09/2010   Method used:   Electronically to        [facility]. [address] [phone]* (retail)       [address] [place], [place]  [id number]       Ph: [phone]       Fax: [phone]   RxID:   [id number]    Orders Added: 1)  Est. Patient Level III [95638]    Current Allergies (reviewed today): ! PCN ! * SHELLFISH ! * NOVICAIN

## 2010-03-17 ENCOUNTER — Encounter: Payer: Self-pay | Admitting: Family Medicine

## 2010-03-17 ENCOUNTER — Ambulatory Visit (INDEPENDENT_AMBULATORY_CARE_PROVIDER_SITE_OTHER): Payer: BC Managed Care – PPO | Admitting: Family Medicine

## 2010-03-17 DIAGNOSIS — J209 Acute bronchitis, unspecified: Secondary | ICD-10-CM

## 2010-03-17 DIAGNOSIS — F17200 Nicotine dependence, unspecified, uncomplicated: Secondary | ICD-10-CM

## 2010-03-21 ENCOUNTER — Telehealth (INDEPENDENT_AMBULATORY_CARE_PROVIDER_SITE_OTHER): Payer: Self-pay | Admitting: *Deleted

## 2010-03-22 NOTE — Assessment & Plan Note (Signed)
Summary: COUGH/CLE   [employer]   Vital Signs:  Patient profile:   65 year old female Weight:      189 pounds O2 Sat:      99 % on Room air Temp:     98.4 degrees F oral Pulse rate:   112 / minute Pulse rhythm:   regular BP sitting:   110 / 68  (left arm) Cuff size:   large  Vitals Entered By: Mervin Hack CMA Duncan Dull) (March 17, 2010 10:51 AM)  O2 Flow:  Room air CC: cough   History of Present Illness: 65 year old female:  Has been sick for two weeks. has been taking some mucinex dm and took some tussionex at night.   waking up at night and wheezing.    Acute Visit History:      The patient complains of cough, fever, nasal discharge, and sore throat.  These symptoms began 2 weeks ago.  She denies headache and sinus problems.  Other comments include: SMOKER tussionex helps at night.        The patient notes wheezing.  The cough interferes with her sleep.  The character of the cough is described as nonproductive.  There is no history of shortness of breath, respiratory retractions, tachypnea, cyanosis, or interference with oral intake associated with her cough.        Urine output has been normal.  She is tolerating clear liquids.        Allergies: 1)  ! Pcn 2)  ! * Shellfish 3)  ! * Novicain  Past History:  Past medical, surgical, family and social histories (including risk factors) reviewed, and no changes noted (except as noted below).  Past Medical History: Reviewed history from 05/12/2008 and no changes required. Allergic rhinitis Asthma Diabetes mellitus, type II (borderline) GERD Hyperlipidemia Hypertension Heart murmur Kidney stones Rheumatic Rever Ectopic Pregnancy, 1980's, R TMJ  Past Surgical History: Reviewed history from 05/04/2008 and no changes required. Gall Bladder (2000) Fallopian Tube, R, removal, 1980's  Family History: Reviewed history from 05/04/2008 and no changes required. Family History Diabetes 1st degree relative (parent,  gransparent) Family History Hypertension (parent) Family History of Stroke F 1st degree relative (mother) M, CVA, d/c at 33 GM, CVA Father alive, 55, no contact  Social History: Reviewed history from 05/04/2008 and no changes required. 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  2 packs a day Alcohol use-yes Drug use-no Regular exercise-no  Review of Systems       REVIEW OF SYSTEMS GEN: Acute illness details above. CV: No chest pain or SOB GI: No noted N or V Otherwise, pertinent positives and negatives are noted in the HPI.   Physical Exam  Additional Exam:  GEN: A and O x 3. WDWN. NAD.    ENT: Nose clear, ext NML.  No LAD.  No JVD.  TM's clear. Oropharynx clear.  PULM: Normal WOB, no distress. No crackles, moderate wheezing. different from baseline exam. CV: RRR, no M/G/R, No rubs, No JVD.   ABD: S, NT, ND, + BS. No rebound. No guarding. No HSM.   EXT: warm and well-perfused, No c/c/e. PSYCH: Pleasant and conversant.    Impression & Recommendations:  Problem # 1:  BRONCHITIS- ACUTE (ICD-466.0) Assessment New Please see the patient instructions for a detailed list of plans and what was discussed with the patient.   abx, albuterol supportive care cough meds at night ok  may have some degree  of copd, smoked 2-3 packs of cigs a day, quit in the 1970's  Her updated medication list for this problem includes:    Singulair 10 Mg Tabs (Montelukast sodium) .Marland Kitchen... Take 1 tablet by mouth once a day    Azithromycin 250 Mg Tabs (Azithromycin) .Marland Kitchen... 2 by  mouth today and then 1 daily for 4 days    Hydrocod Polst-chlorphen Polst 10-8 Mg/57ml Lqcr (Hydrocod polst-chlorphen polst) .Marland Kitchen... 1 tsp by mouth at bedtime as needed cough    Ventolin Hfa 108 (90 Base) Mcg/act Aers (Albuterol sulfate) .Marland Kitchen... 2 puffs every 4 hours as needed wheezing or shortness of breath  Problem # 2:  TOBACCO USE (ICD-305.1) The  patient does smoke cigarettes, and we discussed that tobacco is harmful to one's overall health, and that likely quitting smoking would be the single best thing that they could do for their health.   Complete Medication List: 1)  Lyrica 50 Mg Caps (Pregabalin) .... Take 1 by mouth once daily at bedtime 2)  Lisinopril 10 Mg Tabs (Lisinopril) .Marland Kitchen.. 1 by mouth at night 3)  Nexium 40 Mg Cpdr (Esomeprazole magnesium) .... Take 1 tablet by mouth once a day 4)  Metoprolol Succinate 50 Mg Xr24h-tab (Metoprolol succinate) .Marland Kitchen.. 1 by mouth daily 5)  Singulair 10 Mg Tabs (Montelukast sodium) .... Take 1 tablet by mouth once a day 6)  Azithromycin 250 Mg Tabs (Azithromycin) .... 2 by  mouth today and then 1 daily for 4 days 7)  Hydrocod Polst-chlorphen Polst 10-8 Mg/35ml Lqcr (Hydrocod polst-chlorphen polst) .Marland Kitchen.. 1 tsp by mouth at bedtime as needed cough 8)  Ventolin Hfa 108 (90 Base) Mcg/act Aers (Albuterol sulfate) .... 2 puffs every 4 hours as needed wheezing or shortness of breath  Patient Instructions: 1)  BRONCHITIS 2)  -Viral or baterial infections of the lung. Fever, cough, chest pain, shortness of breath, phlegm production, fatigue are symptoms. 3)  Treatment: 4)  1. Take all medicines 5)  2. Antibiotics  6)  3. Cough suppressants 7)  4. Bronchodilators: an inhaler 8)  5. Expectorant like Guaifenesin (Robitussin, Mucinex) 9)  Fluids and Moisture help: drink lots of fluids 10)  Vaporizier or humidifier in room, shower steam 11)  --help loosen secretions and sooth breathing passages 12)  Elevate head slightly when trying to sleep. Prescriptions: VENTOLIN HFA 108 (90 BASE) MCG/ACT AERS (ALBUTEROL SULFATE) 2 puffs every 4 hours as needed wheezing or shortness of breath  #1 x 1   Entered and Authorized by:   [name] MD   Signed by:   [name] MD on 03/17/2010   Method used:   Print then Give to Patient   RxID:   [id number] HYDROCOD POLST-CHLORPHEN POLST 10-8 MG/5ML LQCR  (HYDROCOD POLST-CHLORPHEN POLST) 1 tsp by mouth at bedtime as needed cough  #240 mL x 0   Entered and Authorized by:   [name] MD   Signed by:   [name] MD on 03/17/2010   Method used:   Print then Give to Patient   RxID:   [id number] AZITHROMYCIN 250 MG  TABS (AZITHROMYCIN) 2 by  mouth today and then 1 daily for 4 days  #6 x 0   Entered and Authorized by:   [name] MD   Signed by:   [name] MD on 03/17/2010   Method used:   Print then Give to Patient   RxID:   [id number]    Orders Added: 1)  Est. Patient Level IV [53664]    Current  Allergies (reviewed today): ! PCN ! * SHELLFISH ! * NOVICAIN

## 2010-03-30 ENCOUNTER — Encounter: Payer: Self-pay | Admitting: Family Medicine

## 2010-03-31 NOTE — Progress Notes (Signed)
Summary: not any better  Phone Note Call from Patient   Caller: Patient Call For: [name] MD Summary of Call: Pt was seen last week for bronchitis and given a z-pack.  She finished this today and still has sxs- still has cough.  Advised pt that even though she only took zithromax for 5 days it will continue to work in her system for another 5.  Also advised pt that the cough will probably linger for awhile after other symptoms are gone.                     Lowella Petties CMA, AAMA  March 21, 2010 3:21 PM   Follow-up for Phone Call        i think much of her symptoms are from her wheezing / lungs from smoking history.  would not expect to be better now, cough could linger for a couple of weeks. abx in system.  call in steroids for wheezing. prednisone 20 mg, 2 tabs by mouth daily x 5 days, #10.  0 refills Follow-up by: [name] MD,  March 21, 2010 3:38 PM    New/Updated Medications: PREDNISONE 20 MG TABS (PREDNISONE) take 2 tablets daily Prescriptions: PREDNISONE 20 MG TABS (PREDNISONE) take 2 tablets daily  #10 x 5   Entered by:   Benny Lennert CMA (AAMA)   Authorized by:   [name] MD   Signed by:   Benny Lennert CMA (AAMA) on 03/21/2010   Method used:   Electronically to        [facility]. [address] [phone]* (retail)       [address] [place], [place]  [id number]       Ph: [phone]       Fax: [phone]   RxID:   [id number]   Current Allergies (reviewed today): ! PCN ! * SHELLFISH ! * NOVICAIN  called to pharmacy and  patient advised accidnetally call in with 5 refills but i called pharmacy to have this fixed spoke to pharmacist megan.Consuello Masse CMA

## 2010-04-15 ENCOUNTER — Other Ambulatory Visit: Payer: Self-pay | Admitting: Family Medicine

## 2010-05-16 ENCOUNTER — Other Ambulatory Visit: Payer: Self-pay | Admitting: Family Medicine

## 2010-05-23 ENCOUNTER — Ambulatory Visit: Payer: Self-pay | Admitting: Family Medicine

## 2010-06-06 ENCOUNTER — Ambulatory Visit (INDEPENDENT_AMBULATORY_CARE_PROVIDER_SITE_OTHER): Payer: BC Managed Care – PPO | Admitting: Family Medicine

## 2010-06-06 ENCOUNTER — Ambulatory Visit: Payer: BC Managed Care – PPO | Admitting: Family Medicine

## 2010-06-06 ENCOUNTER — Encounter: Payer: Self-pay | Admitting: Family Medicine

## 2010-06-06 DIAGNOSIS — E785 Hyperlipidemia, unspecified: Secondary | ICD-10-CM

## 2010-06-06 DIAGNOSIS — E119 Type 2 diabetes mellitus without complications: Secondary | ICD-10-CM

## 2010-06-06 DIAGNOSIS — I10 Essential (primary) hypertension: Secondary | ICD-10-CM

## 2010-06-06 MED ORDER — PRAVASTATIN SODIUM 10 MG PO TABS: 10.0000 mg | ORAL_TABLET | Freq: Every day | ORAL | Status: DC

## 2010-06-06 MED ORDER — LOSARTAN POTASSIUM 50 MG PO TABS: 50.0000 mg | ORAL_TABLET | Freq: Every day | ORAL | Status: DC

## 2010-06-06 MED ORDER — METFORMIN HCL ER 500 MG PO TB24: 500.0000 mg | ORAL_TABLET | Freq: Every day | ORAL | Status: DC

## 2010-06-06 NOTE — Progress Notes (Signed)
65 yo:  HTN: dev a cough after starting ACE No CP, no sob. No HA.  BP Readings from Last 3 Encounters:  06/06/10 140/92  03/17/10 110/68  02/09/10 130/88   Diabetes Mellitus: declined meds in the past Compliance with diet: +/- Exercise: +/- Foot problems: none Hypoglycemia: none No nausea, vomitting, blurred vision, polyuria.  Lab Results  Component Value Date   HGBA1C 7.0* 01/26/2010      Chemistry      Component Value Date/Time   NA 140 01/26/2010 1102   K 4.4 01/26/2010 1102   CL 104 01/26/2010 1102   CO2 26 01/26/2010 1102   BUN 11 01/26/2010 1102   CREATININE 0.8 01/26/2010 1102      Component Value Date/Time   CALCIUM 10.1 01/26/2010 1102   ALKPHOS 69 09/20/2009 0849   AST 20 09/20/2009 0849   ALT 28 09/20/2009 0849   BILITOT 0.8 09/20/2009 0849     Lipids: Has not been able to tol Lipitor or Crestor in the past Panel reviewed with patient.  Lab Results  Component Value Date   CHOL 276* 09/20/2009   CHOL 245* 03/08/2009   CHOL 262* 06/08/2008   Lab Results  Component Value Date   HDL 49.50 09/20/2009   HDL 29.56 03/08/2009   HDL 21.30 06/08/2008   Lab Results  Component Value Date   LDLCALC 186 06/08/2008   Lab Results  Component Value Date   TRIG 192.0* 09/20/2009   TRIG 201.0* 03/08/2009   TRIG 228.0* 06/08/2008   Lab Results  Component Value Date   CHOLHDL 6 09/20/2009   CHOLHDL 5 03/08/2009   CHOLHDL 6 06/08/2008   Lab Results  Component Value Date   LDLDIRECT 201.4 09/20/2009   LDLDIRECT 167.1 03/08/2009   LDLDIRECT 186.8 06/08/2008    Lab Results  Component Value Date   ALT 28 09/20/2009   AST 20 09/20/2009   ALKPHOS 69 09/20/2009   BILITOT 0.8 09/20/2009   ROS: GEN: No acute illnesses, no fevers, chills. GI: No n/v/d, eating normally Pulm: No SOB Facial pain improved Interactive and getting along well at home.  Otherwise, ROS is as per the HPI.  GEN: WDWN, NAD, Non-toxic, A & O x 3 HEENT: Atraumatic, Normocephalic. Neck supple. No masses, No  LAD. Ears and Nose: No external deformity. CV: RRR, No M/G/R. No JVD. No thrill. No extra heart sounds. PULM: CTA B, no wheezes, crackles, rhonchi. No retractions. No resp. distress. No accessory muscle use. EXTR: No c/c/e NEURO Normal gait.  PSYCH: Normally interactive. Conversant. Not depressed or anxious appearing.  Calm demeanor.  Diabetic foot exam: Normal inspection No skin breakdown No calluses  Normal DP pulses Normal sensation to light tough Nails normal  A/P: DM: unstable, now open to starting meds, start glucophage XR HTN, unstable, d/c ACE, start ARB Chol, unstable, open to low dose prava  Recheck labs in 3 mo

## 2010-06-06 NOTE — Patient Instructions (Signed)
F/u 3 months 

## 2010-06-08 ENCOUNTER — Ambulatory Visit: Payer: BC Managed Care – PPO | Admitting: Family Medicine

## 2010-08-20 ENCOUNTER — Other Ambulatory Visit: Payer: Self-pay | Admitting: Family Medicine

## 2010-08-24 ENCOUNTER — Other Ambulatory Visit: Payer: Self-pay | Admitting: Family Medicine

## 2010-10-03 ENCOUNTER — Ambulatory Visit: Payer: BC Managed Care – PPO | Admitting: Family Medicine

## 2010-10-11 ENCOUNTER — Ambulatory Visit (INDEPENDENT_AMBULATORY_CARE_PROVIDER_SITE_OTHER): Payer: BC Managed Care – PPO | Admitting: Family Medicine

## 2010-10-11 DIAGNOSIS — E785 Hyperlipidemia, unspecified: Secondary | ICD-10-CM

## 2010-10-11 DIAGNOSIS — I10 Essential (primary) hypertension: Secondary | ICD-10-CM

## 2010-10-11 DIAGNOSIS — E119 Type 2 diabetes mellitus without complications: Secondary | ICD-10-CM

## 2010-10-11 DIAGNOSIS — Z23 Encounter for immunization: Secondary | ICD-10-CM

## 2010-10-11 LAB — MICROALBUMIN / CREATININE URINE RATIO: Microalb Creat Ratio: 0.4 mg/g (ref 0.0–30.0)

## 2010-10-11 LAB — BASIC METABOLIC PANEL
CO2: 27 mEq/L (ref 19–32)
Chloride: 105 mEq/L (ref 96–112)
Glucose, Bld: 131 mg/dL — ABNORMAL HIGH (ref 70–99)
Sodium: 139 mEq/L (ref 135–145)

## 2010-10-11 LAB — HEMOGLOBIN A1C: Hgb A1c MFr Bld: 7 % — ABNORMAL HIGH (ref 4.6–6.5)

## 2010-10-12 NOTE — Progress Notes (Signed)
  Subjective:    Patient ID: Nina Carpenter, female    DOB: 1945-09-04, 65 y.o.   MRN: [id number]  HPI  No scans are attached to the encounter or orders placed in the encounter.  Review of Systems     Objective:   Physical Exam        Assessment & Plan:

## 2010-10-14 ENCOUNTER — Telehealth: Payer: Self-pay | Admitting: *Deleted

## 2010-10-14 NOTE — Telephone Encounter (Signed)
Yes have her increase losartan to 100 mg daily (2 x 50 mg tablets), make appt to see PCP Dr. Salena Saner early next week for BP and symptom recheck.  If BP not coming down or chest pain, SOB .Marland Kitchen She needs to be seen at ER. Make sure she is taking nexium for indigestion.

## 2010-10-14 NOTE — Telephone Encounter (Signed)
Pt states her BP has been elevated today, says her cuff is not registering what it is.

## 2010-10-14 NOTE — Telephone Encounter (Signed)
Advised patient, appt made for Monday with Dr. Patsy Lager.

## 2010-10-14 NOTE — Telephone Encounter (Signed)
Note continued from below.  Pt went to pharmacy to have her blood pressure checked .  She got readings of 149/95, 180/98- pulse of 124, 157/110, 162/100- pulse 117. All in her left arm.  She took her meds this morning.  States she feels kind of whoozy, has had a lot of indigestion, which she thinks is making her BP stay up.  Should she take more of her medicine?

## 2010-10-17 ENCOUNTER — Encounter: Payer: Self-pay | Admitting: Family Medicine

## 2010-10-17 ENCOUNTER — Ambulatory Visit (INDEPENDENT_AMBULATORY_CARE_PROVIDER_SITE_OTHER): Payer: BC Managed Care – PPO | Admitting: Family Medicine

## 2010-10-17 VITALS — BP 150/86 | HR 83 | Temp 99.0°F | Wt 194.0 lb

## 2010-10-17 DIAGNOSIS — I10 Essential (primary) hypertension: Secondary | ICD-10-CM

## 2010-10-17 DIAGNOSIS — J0100 Acute maxillary sinusitis, unspecified: Secondary | ICD-10-CM

## 2010-10-17 MED ORDER — AZITHROMYCIN 250 MG PO TABS: ORAL_TABLET | ORAL | Status: AC

## 2010-10-17 MED ORDER — METOPROLOL SUCCINATE ER 100 MG PO TB24: 100.0000 mg | ORAL_TABLET | Freq: Every day | ORAL | Status: DC

## 2010-10-17 MED ORDER — LOSARTAN POTASSIUM 100 MG PO TABS: 100.0000 mg | ORAL_TABLET | Freq: Every day | ORAL | Status: DC

## 2010-10-17 NOTE — Progress Notes (Signed)
Subjective:    Patient ID: CONSTANTINA LASETER, female    DOB: 1945/11/29, 65 y.o.   MRN: [id number]  HPI  FALYN RUBEL, a 65 y.o. female presents today in the office for the following:    Elevated BP: HTN: Tolerating all medications without side effects elev -- has been as high as 180's. Increased ARB last Fri No CP, no sob. occ HA  BP Readings from Last 3 Encounters:  10/17/10 150/86  06/06/10 140/92  03/17/10 110/68    Basic Metabolic Panel:    Component Value Date/Time   NA 139 10/11/2010 1001   K 4.1 10/11/2010 1001   CL 105 10/11/2010 1001   CO2 27 10/11/2010 1001   BUN 18 10/11/2010 1001   CREATININE 0.9 10/11/2010 1001   GLUCOSE 131* 10/11/2010 1001   CALCIUM 9.4 10/11/2010 1001   Sinusitis: sinus pressure, pain, runny nose. HA. Facial pain  Patient Active Problem List  Diagnoses  . DIABETES MELLITUS, TYPE II  . HYPERLIPIDEMIA  . HYPERTENSION  . ALLERGIC RHINITIS  . ASTHMA  . GERD  . POSTMENOPAUSAL STATUS  . NEURALGIA, TRIGEMINAL  . FATIGUE  . TOBACCO USE   Past Medical History  Diagnosis Date  . Allergy   . Asthma   . Diabetes mellitus   . GERD (gastroesophageal reflux disease)   . Hyperlipidemia   . Hypertension   . Kidney stones   . Rheumatic fever   . TMJ arthritis   . Heart murmur   . Ectopic pregnancy    Past Surgical History  Procedure Date  . Cholecystectomy   . Tubal ligation     ectopic preganacy    History  Substance Use Topics  . Smoking status: Former Smoker -- 2.0 packs/day    Types: Cigarettes    Quit date: 01/24/1968  . Smokeless tobacco: Not on file  . Alcohol Use: Yes   Family History  Problem Relation Age of Onset  . Stroke Mother   . Diabetes Mother    Allergies  Allergen Reactions  . Penicillins   . Ace Inhibitors Cough   Current Outpatient Prescriptions on File Prior to Visit  Medication Sig Dispense Refill  . metFORMIN (GLUCOPHAGE XR) 500 MG 24 hr tablet Take 1 tablet (500 mg total) by mouth daily with  breakfast.  30 tablet  11  . montelukast (SINGULAIR) 10 MG tablet Take 10 mg by mouth at bedtime.        Marland Kitchen NEXIUM 40 MG capsule TAKE 1 CAPSULE BY MOUTH ONCE DAILY  30 capsule  10  . pravastatin (PRAVACHOL) 10 MG tablet Take 1 tablet (10 mg total) by mouth daily.  30 tablet  11  . pregabalin (LYRICA) 50 MG capsule Take 50 mg by mouth at bedtime.        Marland Kitchen albuterol (VENTOLIN HFA) 108 (90 BASE) MCG/ACT inhaler Inhale 2 puffs into the lungs every 4 (four) hours as needed.           Review of Systems     Objective:   Physical Exam   Physical Exam  Blood pressure 150/86, pulse 83, temperature 99 F (37.2 C), temperature source Oral, weight 194 lb (87.998 kg), SpO2 99.00%.  Gen: WDWN, NAD; alert,appropriate and cooperative throughout exam  HEENT: Normocephalic and atraumatic. Throat clear, w/o exudate, no LAD, R TM clear, L TM - good landmarks, No fluid present. rhinnorhea.  Left frontal and maxillary sinuses: Tender max and ethmoid Right frontal and maxillary sinuses: Tendermax and ethmoid  Neck:  No ant or post LAD CV: RRR, No M/G/R Pulm: Breathing comfortably in no resp distress. no w/c/r Extr: no c/c/e Psych: full affect, pleasant       Assessment & Plan:   1. HYPERTENSION  losartan (COZAAR) 100 MG tablet, metoprolol (TOPROL-XL) 100 MG 24 hr tablet  2. Sinusitis, acute maxillary      incr Toprol dose to 100 mg zpak

## 2010-12-26 ENCOUNTER — Telehealth: Payer: Self-pay | Admitting: Internal Medicine

## 2010-12-26 NOTE — Telephone Encounter (Signed)
Patient is scheduled for 12/27/10.

## 2010-12-27 ENCOUNTER — Encounter: Payer: Self-pay | Admitting: Family Medicine

## 2010-12-27 ENCOUNTER — Ambulatory Visit (INDEPENDENT_AMBULATORY_CARE_PROVIDER_SITE_OTHER): Payer: BC Managed Care – PPO | Admitting: Family Medicine

## 2010-12-27 VITALS — BP 140/80 | HR 101 | Temp 102.9°F | Ht 63.0 in | Wt 197.8 lb

## 2010-12-27 DIAGNOSIS — J111 Influenza due to unidentified influenza virus with other respiratory manifestations: Secondary | ICD-10-CM

## 2010-12-27 MED ORDER — GUAIFENESIN-CODEINE 100-10 MG/5ML PO SYRP: 5.0000 mL | ORAL_SOLUTION | Freq: Two times a day (BID) | ORAL | Status: DC | PRN

## 2010-12-27 NOTE — Patient Instructions (Signed)
INFLUENZA Viral illness: High fever, headache, bad cough, body aches, sore throat, sometimes nausea, vomitting, diarrhea.  Usually quick onset  TREATMENT 2. Nose Sprays: Saline nasal spray, Can use Afrin or Neosynephrine, but no more than 3 days 3. Cough Suppressants: DM portion of cough med, or Strong prescription 4. Expectorant: Liquify Secretions (Guaifenesin) 5. Example: Mucinex (expectorant) or Mucinex-D (expectorant and decongestant) 6. Take all prescribed meds 7. Anti-virals may be used if caught early or in high risk people. (Do NOT if pregnant, breast feeding, seizure disorder) 8. Rest helpful, but move some during day 9. Breathe moist air: Humidifier, Vaporizer, or steam from shower 10. No work or school until no fever for 24 hrs WITH NO Tylenol or Ibuprofen. 11. Pneumonia on top of Flu is possible, if you are doing poorly particularly if a smoker or have COPD, please let us know. 12. Wash hands and cover mouth with cough

## 2010-12-27 NOTE — Progress Notes (Signed)
  Patient Name: Nina Carpenter Date of Birth: [birth year] Age: 65 y.o. Medical Record Number: [id number] Gender: female  History of Present Illness:  Nina Carpenter is a 65 y.o. very pleasant female patient who presents with the following:  Fell on Friday --- then started to hurt in abdomen -- but only one time. Feeling lousy.  No energy. Could not sleep.  Fever to 103, sweating, having chills.  Drank some bouillon.   Primary c/o is cough, arthralgia, myalgia, fever. Some mild ST. Generally feels terrible.  Having some muscle aches and joint aches and coughing really bad.  Has  Been sick since Friday.   Past Medical History, Surgical History, Social History, Family History, and Problem List have been reviewed in EHR and updated if relevant.  Review of Systems: ROS: GEN: Acute illness details above GI: Tolerating PO intake GU: maintaining adequate hydration and urination Pulm: No SOB Interactive and getting along well at home.  Otherwise, ROS is as per the HPI.   Physical Examination:  Filed Vitals:   12/27/10 0822  BP: 140/80  Pulse: 101  Temp: 102.9 F (39.4 C)  Height: 5\' 3"  (1.6 m)  Weight: 197 lb 12.8 oz (89.721 kg)  SpO2: 93%    Gen: WDWN, NAD; A & O x3, cooperative. Pleasant.Globally Non-toxic HEENT: Normocephalic and atraumatic. Throat clear, w/o exudate, R TM clear, L TM - good landmarks, No fluid present. rhinnorhea. No frontal or maxillary sinus T. MMM NECK: Anterior cervical  LAD is present CV: RRR, No M/G/R, cap refill <2 sec PULM: Breathing comfortably in no respiratory distress. no wheezing, crackles, rhonchi ABD: S,NT,ND,+BS. No HSM. No rebound. EXT: No c/c/e PSYCH: Friendly, good eye contact MSK: Nml gait   Assessment and Plan: 1. Flu with respiratory manifestations     The patient's clinical exam and history is consistent with a diagnosis of influenza.  I discussed with the patient that the CDC and [place] Infectious disease personel  are recommended that we not do the rapid influenza screening test on people that have a clinical history and exam consistent with the diagnosis.  Supportive care. Fluids. Cough medicines as needed  Anti-pyretics. Detailed in p/i  Infection control emphasized, including OOW or school until AF 24 hours.

## 2011-02-19 ENCOUNTER — Other Ambulatory Visit: Payer: Self-pay | Admitting: Family Medicine

## 2011-02-20 NOTE — Telephone Encounter (Signed)
Ok to refill 30, 5 refills 

## 2011-04-07 ENCOUNTER — Other Ambulatory Visit: Payer: Self-pay | Admitting: Family Medicine

## 2011-04-17 ENCOUNTER — Other Ambulatory Visit: Payer: Self-pay | Admitting: Family Medicine

## 2011-05-03 DIAGNOSIS — G500 Trigeminal neuralgia: Secondary | ICD-10-CM | POA: Diagnosis not present

## 2011-05-03 DIAGNOSIS — R51 Headache: Secondary | ICD-10-CM | POA: Diagnosis not present

## 2011-05-05 DIAGNOSIS — G500 Trigeminal neuralgia: Secondary | ICD-10-CM | POA: Diagnosis not present

## 2011-05-05 DIAGNOSIS — R51 Headache: Secondary | ICD-10-CM | POA: Diagnosis not present

## 2011-05-09 DIAGNOSIS — G500 Trigeminal neuralgia: Secondary | ICD-10-CM | POA: Diagnosis not present

## 2011-05-09 DIAGNOSIS — R51 Headache: Secondary | ICD-10-CM | POA: Diagnosis not present

## 2011-05-22 ENCOUNTER — Other Ambulatory Visit: Payer: Self-pay | Admitting: Family Medicine

## 2011-05-23 ENCOUNTER — Telehealth: Payer: Self-pay | Admitting: *Deleted

## 2011-05-23 NOTE — Telephone Encounter (Signed)
Received faxed request from pharmacy for prior authorization on Montelukast (Singulair). Form from [employer] is in your in basket to be completed and faxed back.

## 2011-05-23 NOTE — Telephone Encounter (Signed)
Will do!

## 2011-06-13 DIAGNOSIS — M999 Biomechanical lesion, unspecified: Secondary | ICD-10-CM | POA: Diagnosis not present

## 2011-06-13 DIAGNOSIS — M9981 Other biomechanical lesions of cervical region: Secondary | ICD-10-CM | POA: Diagnosis not present

## 2011-08-07 ENCOUNTER — Ambulatory Visit: Payer: BC Managed Care – PPO | Admitting: Family Medicine

## 2011-08-07 ENCOUNTER — Encounter: Payer: Self-pay | Admitting: Family Medicine

## 2011-08-07 ENCOUNTER — Ambulatory Visit (INDEPENDENT_AMBULATORY_CARE_PROVIDER_SITE_OTHER): Payer: Medicare Other | Admitting: Family Medicine

## 2011-08-07 VITALS — BP 140/80 | HR 71 | Temp 98.4°F | Ht 63.0 in | Wt 195.5 lb

## 2011-08-07 DIAGNOSIS — R300 Dysuria: Secondary | ICD-10-CM

## 2011-08-07 DIAGNOSIS — Z8619 Personal history of other infectious and parasitic diseases: Secondary | ICD-10-CM | POA: Diagnosis not present

## 2011-08-07 DIAGNOSIS — R002 Palpitations: Secondary | ICD-10-CM

## 2011-08-07 DIAGNOSIS — Z79899 Other long term (current) drug therapy: Secondary | ICD-10-CM

## 2011-08-07 DIAGNOSIS — Z8679 Personal history of other diseases of the circulatory system: Secondary | ICD-10-CM

## 2011-08-07 DIAGNOSIS — E119 Type 2 diabetes mellitus without complications: Secondary | ICD-10-CM | POA: Diagnosis not present

## 2011-08-07 DIAGNOSIS — R000 Tachycardia, unspecified: Secondary | ICD-10-CM | POA: Diagnosis not present

## 2011-08-07 LAB — POCT URINALYSIS DIPSTICK
Bilirubin, UA: NEGATIVE
Blood, UA: NEGATIVE
Ketones, UA: NEGATIVE
Leukocytes, UA: NEGATIVE
Spec Grav, UA: 1.025
pH, UA: 6

## 2011-08-07 MED ORDER — AMLODIPINE BESYLATE 5 MG PO TABS: 5.0000 mg | ORAL_TABLET | Freq: Every day | ORAL | Status: DC

## 2011-08-07 NOTE — Progress Notes (Signed)
Nature conservation officer at [facility] [address] [place] [address] Phone: [phone] Fax: [phone]  Date:  08/07/2011   Name:  DIVA LEMBERGER   DOB:  [birth year]   MRN:  [id number]  PCP:  [name], MD    Chief Complaint: Dizziness   History of Present Illness:  Nina Carpenter is a 66 y.o. very pleasant female patient who presents with the following:  Tachycardia, 150 earlier this morning. Elevated BP  Patient complains of multiple problems, but her primary issue is acute elevated blood pressure as well as acute tachycardia and palpitations. The patient complains that she had some tachycardia and palpitations it lasted for operative 3 hours this morning. She has had old other occasions come into the office with acutely high blood pressure, and has had some mildly tachycardia before. She is notably on Toprol-XL 100 mg, but still complains of tachycardia palpitations.  Today, she felt as if she got dizzy, she felt like her heart was fluttering, and was not beating in an appropriate rhythm and it was going fast.   Ate some french fries, then had some diarrhea, and stomach had a lot of spasms, then took some pepcid. Calmed it down.  This morning, when ate, started again and went to th ebathrooom.   BP was 147/100, then 149 pulse. Took a librax Next time, pulse was 127. Then 113  Got dizzy with this. Felt like her heart was fluttering, not in rhythm and going fast 11 AM - 2 PM.  BP Readings from Last 3 Encounters:  08/07/11 140/80  12/27/10 140/80  10/17/10 150/86     Patient Active Problem List  Diagnosis  . DIABETES MELLITUS, TYPE II  . HYPERLIPIDEMIA  . HYPERTENSION  . ALLERGIC RHINITIS  . ASTHMA  . GERD  . POSTMENOPAUSAL STATUS  . NEURALGIA, TRIGEMINAL  . FATIGUE  . TOBACCO USE    Past Medical History  Diagnosis Date  . Allergy   . Asthma   . Diabetes mellitus   . GERD (gastroesophageal reflux disease)   . Hyperlipidemia   .  Hypertension   . Kidney stones   . Rheumatic fever   . TMJ arthritis   . Heart murmur   . Ectopic pregnancy     Past Surgical History  Procedure Date  . Cholecystectomy   . Tubal ligation     ectopic preganacy     History  Substance Use Topics  . Smoking status: Former Smoker -- 2.0 packs/day    Types: Cigarettes    Quit date: 01/24/1968  . Smokeless tobacco: Not on file  . Alcohol Use: Yes    Family History  Problem Relation Age of Onset  . Stroke Mother   . Diabetes Mother     Allergies  Allergen Reactions  . Penicillins   . Ace Inhibitors Cough    Medication list has been reviewed and updated.  Current Outpatient Prescriptions on File Prior to Visit  Medication Sig Dispense Refill  . clidinium-chlordiazePOXIDE (LIBRAX) 2.5-5 MG per capsule TAKE 1 CAPSULE BY MOUTH EVERY MORNING AS NEEDED  30 capsule  5  . losartan (COZAAR) 100 MG tablet take 1 tablet by mouth daily  30 tablet  5  . metoprolol succinate (TOPROL-XL) 100 MG 24 hr tablet take 1 tablet by mouth daily  30 tablet  5  . montelukast (SINGULAIR) 10 MG tablet TAKE 1 TABLET BY MOUTH ONCE DAILY  30 tablet  10  . NEXIUM 40 MG capsule TAKE  1 CAPSULE BY MOUTH ONCE DAILY  30 capsule  10  . pregabalin (LYRICA) 50 MG capsule Take 50 mg by mouth at bedtime.        Marland Kitchen albuterol (VENTOLIN HFA) 108 (90 BASE) MCG/ACT inhaler Inhale 2 puffs into the lungs every 4 (four) hours as needed.          Review of Systems:  Chronic complaints of severe trigeminal neuralgia on the left side of her face continue. Significant reflux. She did have some mild GI upset and diarrhea today with 2 episodes of diarrhea.  Physical Examination: Filed Vitals:   08/07/11 1503  BP: 140/80  Pulse: 71  Temp: 98.4 F (36.9 C)   Filed Vitals:   08/07/11 1503  Height: 5\' 3"  (1.6 m)  Weight: 195 lb 8 oz (88.678 kg)   Body mass index is 34.63 kg/(m^2). Ideal Body Weight: Weight in (lb) to have BMI = 25: 140.8    GEN: WDWN, NAD,  Non-toxic, A & O x 3 HEENT: Atraumatic, Normocephalic. Neck supple. No masses, No LAD. Ears and Nose: No external deformity. CV: RRR, No M/G/R. No JVD. No thrill. No extra heart sounds. PULM: CTA B, no wheezes, crackles, rhonchi. No retractions. No resp. distress. No accessory muscle use. EXTR: No c/c/e NEURO Normal gait.  PSYCH: Normally interactive. Conversant. Not depressed or anxious appearing.  Calm demeanor.    Assessment and Plan:  1. Heart palpitations  Ambulatory referral to Cardiology, TSH  2. Dysuria  POCT Urinalysis Dipstick  3. Hx of rheumatic fever  Ambulatory referral to Cardiology  4. Tachycardia  Ambulatory referral to Cardiology, TSH  5. Type II or unspecified type diabetes mellitus without mention of complication, not stated as uncontrolled  Basic metabolic panel, Hemoglobin A1c  6. Encounter for long-term (current) use of other medications  Hepatic function panel, CBC with Differential   Blood pressure is unstable. She brings in her readings today which have systolics ranging from 140-170. Increase and had Norvasc 5 mg.  Unexplained tachycardia with palpitations today. In a patient with a history of rheumatic fever. Consult cardiology for their opinion.  She did cut a Mucinex D tablet in half this morning, and it is possible that this precipitated the event. However this is not the first time the patient has had some tachycardia and elevated blood pressures acutely. It is possible that there is an anxiety component, however, it is certainly also possible that she could be having intermittent arrhythmia. I appreciate their opinion.  Orders Today:  Orders Placed This Encounter  Procedures  . Basic metabolic panel  . Hepatic function panel  . CBC with Differential  . Hemoglobin A1c  . TSH  . Ambulatory referral to Cardiology    Referral Priority:  Routine    Referral Type:  Consultation    Referral Reason:  Specialty Services Required    Requested Specialty:   Cardiology    Number of Visits Requested:  1  . POCT Urinalysis Dipstick    Medications Today: (Includes new updates added during medication reconciliation) Meds ordered this encounter  Medications  . amLODipine (NORVASC) 5 MG tablet    Sig: Take 1 tablet (5 mg total) by mouth daily.    Dispense:  30 tablet    Refill:  11     [name], MD

## 2011-08-08 LAB — CBC WITH DIFFERENTIAL/PLATELET
Basophils Absolute: 0 10*3/uL (ref 0.0–0.1)
Basophils Relative: 0.3 % (ref 0.0–3.0)
Eosinophils Absolute: 0.1 10*3/uL (ref 0.0–0.7)
Lymphocytes Relative: 22 % (ref 12.0–46.0)
MCHC: 33.4 g/dL (ref 30.0–36.0)
Neutrophils Relative %: 71.1 % (ref 43.0–77.0)
RBC: 5.29 Mil/uL — ABNORMAL HIGH (ref 3.87–5.11)
RDW: 13.3 % (ref 11.5–14.6)

## 2011-08-08 LAB — HEMOGLOBIN A1C: Hgb A1c MFr Bld: 7.5 % — ABNORMAL HIGH (ref 4.6–6.5)

## 2011-08-09 ENCOUNTER — Ambulatory Visit: Payer: BC Managed Care – PPO | Admitting: Family Medicine

## 2011-08-09 DIAGNOSIS — M999 Biomechanical lesion, unspecified: Secondary | ICD-10-CM | POA: Diagnosis not present

## 2011-08-09 DIAGNOSIS — M5030 Other cervical disc degeneration, unspecified cervical region: Secondary | ICD-10-CM | POA: Diagnosis not present

## 2011-08-09 DIAGNOSIS — M9981 Other biomechanical lesions of cervical region: Secondary | ICD-10-CM | POA: Diagnosis not present

## 2011-08-09 DIAGNOSIS — M5137 Other intervertebral disc degeneration, lumbosacral region: Secondary | ICD-10-CM | POA: Diagnosis not present

## 2011-08-10 ENCOUNTER — Ambulatory Visit (INDEPENDENT_AMBULATORY_CARE_PROVIDER_SITE_OTHER): Payer: Medicare Other | Admitting: Family Medicine

## 2011-08-10 ENCOUNTER — Encounter: Payer: Self-pay | Admitting: Family Medicine

## 2011-08-10 VITALS — BP 135/61 | HR 69 | Temp 98.4°F | Ht 63.0 in | Wt 195.0 lb

## 2011-08-10 DIAGNOSIS — I10 Essential (primary) hypertension: Secondary | ICD-10-CM

## 2011-08-10 DIAGNOSIS — R002 Palpitations: Secondary | ICD-10-CM | POA: Diagnosis not present

## 2011-08-10 DIAGNOSIS — J309 Allergic rhinitis, unspecified: Secondary | ICD-10-CM

## 2011-08-10 DIAGNOSIS — E119 Type 2 diabetes mellitus without complications: Secondary | ICD-10-CM | POA: Diagnosis not present

## 2011-08-10 LAB — HEPATIC FUNCTION PANEL
ALT: 27 U/L (ref 0–35)
Bilirubin, Direct: 0 mg/dL (ref 0.0–0.3)
Total Protein: 7.3 g/dL (ref 6.0–8.3)

## 2011-08-10 LAB — BASIC METABOLIC PANEL
BUN: 19 mg/dL (ref 6–23)
CO2: 27 mEq/L (ref 19–32)
Chloride: 106 mEq/L (ref 96–112)
Creatinine, Ser: 1 mg/dL (ref 0.4–1.2)

## 2011-08-10 NOTE — Assessment & Plan Note (Signed)
Keep appt with cardiology as scheduled early next week.

## 2011-08-10 NOTE — Assessment & Plan Note (Signed)
Improved control without starting amlodipine. Hold this med for now. Lab eval negative except as otherwise discussed.

## 2011-08-10 NOTE — Assessment & Plan Note (Signed)
Worsened control...pt open to nutrition referral. Work on lifestyle change. Info given. Follow up if 3 months if not improving will need medication initiation.

## 2011-08-10 NOTE — Progress Notes (Signed)
Subjective:    Patient ID: Nina Carpenter, female    DOB: 1945-02-20, 66 y.o.   MRN: [id number]  HPI  66 year old female presents following recetn eval with Dr. Patsy Lager 2 days ago for the following.   7/15:Patient complains of multiple problems, but her primary issue is acute elevated blood pressure as well as acute tachycardia and palpitations. The patient complains that she had some tachycardia and palpitations it lasted for operative 3 hours this morning. She has had old other occasions come into the office with acutely high blood pressure, and has had some mildly tachycardia before.  On day of last appt: She felt as if she got dizzy, she felt like her heart was fluttering, and was not beating in an appropriate rhythm and it was going fast. HR measured up to 146  Dr [name] referred pt to cardiology at that time. Amlodipine added to toprol XL 100 mg daily, she has not started. She was not sure what time of day to take so did not  Had labs.. Thyroid, cbc was normal.  She has been having pain in tooth.  Also some back pain.. Flexeril helped with muscle soreness.  Had been having some diarrhea. She reports BPs have been coming down... 110/70 at home.. HR running 60-65 at home in past few days.  Some pressure in sinus no dizziness remains.   She reports she feels better today, but weak.      Review of Systems  Constitutional: Negative for fever and fatigue.  HENT: Positive for congestion. Negative for ear pain.   Eyes: Negative for pain.  Respiratory: Negative for chest tightness and shortness of breath.   Cardiovascular: Negative for chest pain, palpitations and leg swelling.  Gastrointestinal: Negative for abdominal pain.  Genitourinary: Negative for dysuria.       Objective:   Physical Exam  Constitutional: Vital signs are normal. She appears well-developed and well-nourished. She is cooperative.  Non-toxic appearance. She does not appear ill. No distress.  HENT:  Head:  Normocephalic.  Right Ear: Hearing, tympanic membrane, external ear and ear canal normal. Tympanic membrane is not erythematous, not retracted and not bulging.  Left Ear: Hearing, tympanic membrane, external ear and ear canal normal. Tympanic membrane is not erythematous, not retracted and not bulging.  Nose: No mucosal edema or rhinorrhea. Right sinus exhibits no maxillary sinus tenderness and no frontal sinus tenderness. Left sinus exhibits no maxillary sinus tenderness and no frontal sinus tenderness.  Mouth/Throat: Uvula is midline, oropharynx is clear and moist and mucous membranes are normal.  Eyes: Conjunctivae, EOM and lids are normal. Pupils are equal, round, and reactive to light. No foreign bodies found.  Neck: Trachea normal and normal range of motion. Neck supple. Carotid bruit is not present. No mass and no thyromegaly present.  Cardiovascular: Normal rate, regular rhythm, S1 normal, S2 normal, normal heart sounds, intact distal pulses and normal pulses.  Exam reveals no gallop and no friction rub.   No murmur heard. Pulmonary/Chest: Effort normal and breath sounds normal. Not tachypneic. No respiratory distress. She has no decreased breath sounds. She has no wheezes. She has no rhonchi. She has no rales.  Abdominal: Soft. Normal appearance and bowel sounds are normal. There is no tenderness.  Neurological: She is alert.  Skin: Skin is warm, dry and intact. No rash noted.  Psychiatric: Her speech is normal and behavior is normal. Judgment and thought content normal. Her mood appears not anxious. Cognition and memory are normal. She does  not exhibit a depressed mood.          Assessment & Plan:

## 2011-08-10 NOTE — Assessment & Plan Note (Signed)
Sinus pressure likely due to allergies and congestion.. Treat with nasal saline irrigation. Avoid decongestants.

## 2011-08-10 NOTE — Patient Instructions (Addendum)
Work on low carbohydrate diabetic diet, exercise and weight loss.  Decrease ice cream.  Stop at front desk for a nutrition referral. Keep appt with cardiology. Hold amlodipine for now. Fasting goal 80-120, 2 hours after meals goal <180.  Start nasal saline irrigation.  Follow up with Dr. Patsy Lager in 3 months with fasting labs prior.

## 2011-08-14 ENCOUNTER — Encounter: Payer: Self-pay | Admitting: Cardiovascular Disease

## 2011-08-14 ENCOUNTER — Ambulatory Visit (INDEPENDENT_AMBULATORY_CARE_PROVIDER_SITE_OTHER): Payer: Medicare Other | Admitting: Cardiovascular Disease

## 2011-08-14 VITALS — BP 150/70 | HR 73 | Ht 63.0 in | Wt 178.0 lb

## 2011-08-14 DIAGNOSIS — I10 Essential (primary) hypertension: Secondary | ICD-10-CM

## 2011-08-14 DIAGNOSIS — R002 Palpitations: Secondary | ICD-10-CM

## 2011-08-14 DIAGNOSIS — E119 Type 2 diabetes mellitus without complications: Secondary | ICD-10-CM

## 2011-08-14 DIAGNOSIS — E785 Hyperlipidemia, unspecified: Secondary | ICD-10-CM

## 2011-08-14 NOTE — Assessment & Plan Note (Signed)
We have encouraged continued exercise, careful diet management in an effort to lose weight. 

## 2011-08-14 NOTE — Patient Instructions (Addendum)
You are doing well. No medication changes were made.  Please call us if you have new issues that need to be addressed before your next appt.    

## 2011-08-14 NOTE — Assessment & Plan Note (Signed)
Blood pressure is mildly elevated today. She reports outside the office, it is better controlled. We have asked her to monitor her blood pressure at home and if this is elevated, she could start amlodipine.

## 2011-08-14 NOTE — Assessment & Plan Note (Signed)
She would likely benefit from repeat lipid panel. If this continues to be high, we could potentially start low-dose statin, titrating upwards as tolerated.

## 2011-08-14 NOTE — Progress Notes (Signed)
Patient ID: Nina Carpenter, female    DOB: [birth year], 66 y.o.   MRN: [id number]  HPI Comments: Nina Carpenter is a very pleasant 67 rolled woman with history of previous GI issues (she reports previous diagnosis of hiatal hernia), hypertension, obesity, muscle cramps in her right flank, who presents for evaluation of tachycardia, palpitations and hypertension.  She reports that all of her symptoms seem to come from her gas problem. After she eats, she has frequent indigestion. She feels gassy with discomfort radiating from her upper epigastrium around the right side to her back. In the setting of having gas, she has palpitations and sometimes tachycardia. Frequently she will have right leg pain in the setting of having gas. Symptoms are relieved by belching. She takes Gaviscon, simethicone. She has tried Axid in the past with good relief. She reports taking Nexium but does not think this is helping. She does not have tachycardia by itself, only after she develops GI distress. Otherwise she has no significant shortness of breath at baseline, no chest pain.  She does feel hot frequently and feels that she should be beyond her menopausal symptoms. She has always had problems in the heat. Sometimes with shortness of breath from asthma.   She is now taking amlodipine and reports her blood pressure has been well-controlled recently  EKG shows normal sinus rhythm with a rate 73 beats per minute with no significant ST or T wave changes   Outpatient Encounter Prescriptions as of 08/14/2011  Medication Sig Dispense Refill  . albuterol (VENTOLIN HFA) 108 (90 BASE) MCG/ACT inhaler Inhale 2 puffs into the lungs every 4 (four) hours as needed.        . clidinium-chlordiazePOXIDE (LIBRAX) 2.5-5 MG per capsule TAKE 1 CAPSULE BY MOUTH EVERY MORNING AS NEEDED  30 capsule  5  . losartan (COZAAR) 100 MG tablet take 1 tablet by mouth daily  30 tablet  5  . metoprolol succinate (TOPROL-XL) 100 MG 24 hr tablet take 1  tablet by mouth daily  30 tablet  5  . montelukast (SINGULAIR) 10 MG tablet TAKE 1 TABLET BY MOUTH ONCE DAILY  30 tablet  10  . NEXIUM 40 MG capsule TAKE 1 CAPSULE BY MOUTH ONCE DAILY  30 capsule  10  . pregabalin (LYRICA) 50 MG capsule Take 50 mg by mouth at bedtime.        Marland Kitchen amLODipine (NORVASC) 5 MG tablet Take 1 tablet (5 mg total) by mouth daily.  NOT TAKING  30 tablet  11   Review of Systems  Constitutional: Negative.   HENT: Negative.   Eyes: Negative.   Respiratory: Negative.   Cardiovascular: Negative.   Gastrointestinal: Positive for abdominal pain.       Gas pain, belching, radiating pain around her right flank to her back with gas.  Musculoskeletal: Negative.   Skin: Negative.   Neurological: Negative.   Hematological: Negative.   Psychiatric/Behavioral: Negative.   All other systems reviewed and are negative.    BP 150/70  Pulse 73  Ht 5\' 3"  (1.6 m)  Wt 178 lb (80.74 kg)  BMI 31.53 kg/m2  Physical Exam  Nursing note and vitals reviewed. Constitutional: She is oriented to person, place, and time. She appears well-developed and well-nourished.       Obese  HENT:  Head: Normocephalic.  Nose: Nose normal.  Mouth/Throat: Oropharynx is clear and moist.  Eyes: Conjunctivae are normal. Pupils are equal, round, and reactive to light.  Neck: Normal range of motion.  Neck supple. No JVD present.  Cardiovascular: Normal rate, regular rhythm, S1 normal, S2 normal, normal heart sounds and intact distal pulses.  Exam reveals no gallop and no friction rub.   No murmur heard. Pulmonary/Chest: Effort normal and breath sounds normal. No respiratory distress. She has no wheezes. She has no rales. She exhibits no tenderness.  Abdominal: Soft. Bowel sounds are normal. She exhibits no distension. There is no tenderness.  Musculoskeletal: Normal range of motion. She exhibits no edema and no tenderness.  Lymphadenopathy:    She has no cervical adenopathy.  Neurological: She is alert  and oriented to person, place, and time. Coordination normal.  Skin: Skin is warm and dry. No rash noted. No erythema.  Psychiatric: She has a normal mood and affect. Her behavior is normal. Judgment and thought content normal.         Assessment and Plan

## 2011-08-14 NOTE — Assessment & Plan Note (Addendum)
She reports palpitations and tachycardia in the setting of GI discomfort and gas. She reports previous diagnosis of hiatal hernia. Presentation is consistent with hiatal hernia as all symptoms are relieved after belching. We have suggested she followup with GI. She may benefit from EGD.  If at any point she has symptoms of palpitations not in the setting of GI distress, we have suggested she call office for a Holter monitor. If she is symptomatic, we could offer short-acting as needed blocker such as propranolol.

## 2011-08-24 ENCOUNTER — Institutional Professional Consult (permissible substitution): Payer: BC Managed Care – PPO | Admitting: Cardiovascular Disease

## 2011-09-04 ENCOUNTER — Ambulatory Visit: Payer: Self-pay | Admitting: Family Medicine

## 2011-09-04 DIAGNOSIS — E785 Hyperlipidemia, unspecified: Secondary | ICD-10-CM | POA: Diagnosis not present

## 2011-09-04 DIAGNOSIS — Z7189 Other specified counseling: Secondary | ICD-10-CM | POA: Diagnosis not present

## 2011-09-04 DIAGNOSIS — I10 Essential (primary) hypertension: Secondary | ICD-10-CM | POA: Diagnosis not present

## 2011-09-04 DIAGNOSIS — E119 Type 2 diabetes mellitus without complications: Secondary | ICD-10-CM | POA: Diagnosis not present

## 2011-09-07 ENCOUNTER — Telehealth: Payer: Self-pay

## 2011-09-07 MED ORDER — GLUCOSE BLOOD VI STRP: ORAL_STRIP | Status: DC

## 2011-09-07 MED ORDER — ONETOUCH DELICA LANCETS MISC: Status: DC

## 2011-09-07 NOTE — Telephone Encounter (Signed)
It appears this was done.   [name], MD 09/07/2011, 4:02 PM

## 2011-09-07 NOTE — Telephone Encounter (Signed)
Pt has finished lifestyle diabetic classes; was given one touch ultra mini meter; request delica lancets and test strips sent to [facility] [employer].  Pt was advised by lifestyle to check BS twice a day. Glucose test strips and lancets sent to [facility].

## 2011-09-24 ENCOUNTER — Ambulatory Visit: Payer: Self-pay | Admitting: Family Medicine

## 2011-10-04 ENCOUNTER — Other Ambulatory Visit: Payer: Self-pay | Admitting: Family Medicine

## 2011-10-10 ENCOUNTER — Other Ambulatory Visit: Payer: Self-pay | Admitting: Family Medicine

## 2011-10-11 ENCOUNTER — Other Ambulatory Visit: Payer: Self-pay

## 2011-10-11 MED ORDER — LOSARTAN POTASSIUM 100 MG PO TABS: 100.0000 mg | ORAL_TABLET | Freq: Every day | ORAL | Status: DC

## 2011-10-11 NOTE — Telephone Encounter (Signed)
Request for losartin 100 mg 5 R sent to [facility].

## 2011-11-03 ENCOUNTER — Other Ambulatory Visit: Payer: Self-pay | Admitting: Family Medicine

## 2011-11-03 DIAGNOSIS — E785 Hyperlipidemia, unspecified: Secondary | ICD-10-CM

## 2011-11-03 DIAGNOSIS — E119 Type 2 diabetes mellitus without complications: Secondary | ICD-10-CM

## 2011-11-06 ENCOUNTER — Other Ambulatory Visit: Payer: Medicare Other

## 2011-11-07 ENCOUNTER — Other Ambulatory Visit (INDEPENDENT_AMBULATORY_CARE_PROVIDER_SITE_OTHER): Payer: Medicare Other

## 2011-11-07 DIAGNOSIS — E119 Type 2 diabetes mellitus without complications: Secondary | ICD-10-CM

## 2011-11-07 DIAGNOSIS — E785 Hyperlipidemia, unspecified: Secondary | ICD-10-CM

## 2011-11-07 LAB — LIPID PANEL
Cholesterol: 282 mg/dL — ABNORMAL HIGH (ref 0–200)
Total CHOL/HDL Ratio: 6
VLDL: 48.6 mg/dL — ABNORMAL HIGH (ref 0.0–40.0)

## 2011-11-07 LAB — MICROALBUMIN / CREATININE URINE RATIO
Creatinine,U: 182.3 mg/dL
Microalb Creat Ratio: 1.4 mg/g (ref 0.0–30.0)

## 2011-11-07 LAB — LDL CHOLESTEROL, DIRECT: Direct LDL: 217 mg/dL

## 2011-11-13 ENCOUNTER — Ambulatory Visit (INDEPENDENT_AMBULATORY_CARE_PROVIDER_SITE_OTHER): Payer: Medicare Other | Admitting: Family Medicine

## 2011-11-13 ENCOUNTER — Encounter: Payer: Self-pay | Admitting: Family Medicine

## 2011-11-13 VITALS — BP 140/88 | HR 60 | Temp 98.6°F | Wt 194.5 lb

## 2011-11-13 DIAGNOSIS — E119 Type 2 diabetes mellitus without complications: Secondary | ICD-10-CM | POA: Diagnosis not present

## 2011-11-13 DIAGNOSIS — Z23 Encounter for immunization: Secondary | ICD-10-CM | POA: Diagnosis not present

## 2011-11-13 DIAGNOSIS — I10 Essential (primary) hypertension: Secondary | ICD-10-CM

## 2011-11-13 DIAGNOSIS — E785 Hyperlipidemia, unspecified: Secondary | ICD-10-CM

## 2011-11-13 DIAGNOSIS — G500 Trigeminal neuralgia: Secondary | ICD-10-CM

## 2011-11-13 MED ORDER — PRAVASTATIN SODIUM 20 MG PO TABS: 20.0000 mg | ORAL_TABLET | Freq: Every day | ORAL | Status: DC

## 2011-11-13 NOTE — Progress Notes (Signed)
Nature conservation officer at [facility] [address] [place] [address] Phone: [phone] Fax: [phone]  Date:  11/13/2011   Name:  Nina Carpenter   DOB:  11/01/1945   MRN:  [id number] Gender: female Age: 66 y.o.  PCP:  [name], MD  Evaluating MD: [name], MD   Chief Complaint: Follow-up   History of Present Illness:  Nina Carpenter is a 66 y.o. pleasant patient who presents with the following:  F/u:  DM: BS. Forgot home BS readings. In the mornings below 130, never more than 150-160 at night.   Diabetes Mellitus: Tolerating Medications: yes Compliance with diet: fairly good, occ sweets Exercise: none  Avg blood sugars at home: above Foot problems: none Hypoglycemia: none No nausea, vomitting, blurred vision, polyuria.  Lab Results  Component Value Date   HGBA1C 6.9* 11/07/2011    Wt Readings from Last 3 Encounters:  11/13/11 194 lb 8 oz (88.225 kg)  08/14/11 178 lb (80.74 kg)  08/10/11 195 lb (88.451 kg)    There is no height on file to calculate BMI.   Mouth and teeth are really hurting.   BP elevated. 135 - 140/80 at home.  HTN: Tolerating all medications without side effects No CP, no sob.   BP Readings from Last 3 Encounters:  11/13/11 140/88  08/14/11 150/70  08/10/11 135/61    Basic Metabolic Panel:    Component Value Date/Time   NA 140 08/07/2011 1612   K 4.7 08/07/2011 1612   CL 106 08/07/2011 1612   CO2 27 08/07/2011 1612   BUN 19 08/07/2011 1612   CREATININE 1.0 08/07/2011 1612   GLUCOSE 140* 08/07/2011 1612   CALCIUM 9.5 08/07/2011 1612     Lipids: unstable. She has had some difficulty managing statins in the past. She has had myalgias when taking them. Panel reviewed with patient.  Lipids:    Component Value Date/Time   CHOL 282* 11/07/2011 0748   TRIG 243.0* 11/07/2011 0748   HDL 44.30 11/07/2011 0748   LDLDIRECT 217.0 11/07/2011 0748   VLDL 48.6* 11/07/2011 0748   CHOLHDL 6 11/07/2011 0748     Lab Results  Component Value Date   ALT 27 08/07/2011   AST 19 08/07/2011   ALKPHOS 70 08/07/2011   BILITOT 0.3 08/07/2011   Has had myalgias from statins in the past  ttrigeminal brow just flared up today and she has significant pain in the LEFT side of her face.  Blood pressure is mildly elevated today, and she reports compliance with her medication. When she checks it at home when her pain is not elevated, it has become more at a normal level.  Patient Active Problem List  Diagnosis  . DIABETES MELLITUS, TYPE II  . HYPERLIPIDEMIA  . HYPERTENSION  . ALLERGIC RHINITIS  . ASTHMA  . GERD  . POSTMENOPAUSAL STATUS  . NEURALGIA, TRIGEMINAL  . FATIGUE  . TOBACCO USE  . Hx of rheumatic fever  . Palpitations    Past Medical History  Diagnosis Date  . Allergy   . Asthma   . Diabetes mellitus   . GERD (gastroesophageal reflux disease)   . Hyperlipidemia   . Hypertension   . Kidney stones   . Rheumatic fever   . TMJ arthritis   . Heart murmur   . Ectopic pregnancy   . Hx of rheumatic fever 08/07/2011    Past Surgical History  Procedure Date  . Cholecystectomy   . Tubal ligation  ectopic preganacy     History  Substance Use Topics  . Smoking status: Former Smoker -- 2.0 packs/day    Types: Cigarettes    Quit date: 01/24/1968  . Smokeless tobacco: Never Used  . Alcohol Use: 0.6 oz/week    1 Glasses of wine per week    Family History  Problem Relation Age of Onset  . Stroke Mother   . Diabetes Mother     Allergies  Allergen Reactions  . Fish Allergy     Shell fish  . Penicillins   . Ace Inhibitors Cough    Medication list has been reviewed and updated.  Outpatient Prescriptions Prior to Visit  Medication Sig Dispense Refill  . albuterol (VENTOLIN HFA) 108 (90 BASE) MCG/ACT inhaler Inhale 2 puffs into the lungs every 4 (four) hours as needed.        . clidinium-chlordiazePOXIDE (LIBRAX) 2.5-5 MG per capsule TAKE 1 CAPSULE BY MOUTH EVERY MORNING  AS NEEDED  30 capsule  5  . glucose blood (ONE TOUCH ULTRA TEST) test strip Pt checks blood sugar twice a day and as needed.250.00  100 each  11  . losartan (COZAAR) 100 MG tablet Take 1 tablet (100 mg total) by mouth daily.  30 tablet  5  . metoprolol succinate (TOPROL-XL) 100 MG 24 hr tablet take 1 tablet by mouth once daily  30 tablet  5  . ONETOUCH DELICA LANCETS MISC Checking blood sugar twice a day and as needed. 250.00  100 each  11  . pregabalin (LYRICA) 50 MG capsule Take 50 mg by mouth at bedtime.        . montelukast (SINGULAIR) 10 MG tablet TAKE 1 TABLET BY MOUTH ONCE DAILY  30 tablet  10  . amLODipine (NORVASC) 5 MG tablet Take 1 tablet (5 mg total) by mouth daily.  30 tablet  11  . NEXIUM 40 MG capsule TAKE 1 CAPSULE BY MOUTH ONCE DAILY  30 capsule  10    Review of Systems:   GEN: No acute illnesses, no fevers, chills. GI: No n/v/d, eating normally Pulm: No SOB Facial pain Interactive and getting along well at home.  Otherwise, ROS is as per the HPI.   Physical Examination: Filed Vitals:   11/13/11 1111  BP: 140/88  Pulse: 60  Temp: 98.6 F (37 C)  TempSrc: Oral  Weight: 194 lb 8 oz (88.225 kg)    There is no height on file to calculate BMI. Ideal Body Weight:     GEN: WDWN, NAD, Non-toxic, A & O x 3 HEENT: Atraumatic, Normocephalic. Neck supple. No masses, No LAD. Ears and Nose: No external deformity. CV: RRR, No M/G/R. No JVD. No thrill. No extra heart sounds. PULM: CTA B, no wheezes, crackles, rhonchi. No retractions. No resp. distress. No accessory muscle use. EXTR: No c/c/e NEURO Normal gait.  PSYCH: Normally interactive. Conversant. Not depressed or anxious appearing.  Calm demeanor.    Assessment and Plan:  1. DM Type 2 : stable, cont current meds, work on diet   2. Need for prophylactic vaccination and inoculation against influenza  Flu vaccine greater than or equal to 3yo preservative free IM  3. HYPERLIPIDEMIA : trial of pravachol   4.  HYPERTENSION : suspect elevation today from pain   5. NEURALGIA, TRIGEMINAL : already with dental f/u scheduled tomorrow      Orders Today:  Orders Placed This Encounter  Procedures  . Flu vaccine greater than or equal to 3yo preservative free IM  Updated Medication List: (Includes new medications, updates to list, dose adjustments) Meds ordered this encounter  Medications  . famotidine (PEPCID) 10 MG tablet    Sig: Take 10 mg by mouth 2 (two) times daily as needed.  . Lactase (LACTAID PO)    Sig: Take by mouth daily.  . montelukast (SINGULAIR) 10 MG tablet    Sig:   . amLODipine (NORVASC) 5 MG tablet    Sig: Take 5 mg by mouth daily as needed.  . pravastatin (PRAVACHOL) 20 MG tablet    Sig: Take 1 tablet (20 mg total) by mouth daily.    Dispense:  30 tablet    Refill:  11    Medications Discontinued: Medications Discontinued During This Encounter  Medication Reason  . NEXIUM 40 MG capsule Patient has not taken in last 30 days  . amLODipine (NORVASC) 5 MG tablet   . montelukast (SINGULAIR) 10 MG tablet      [name], MD

## 2011-11-13 NOTE — Patient Instructions (Addendum)
F/u 6 months

## 2011-12-04 ENCOUNTER — Ambulatory Visit: Payer: Self-pay | Admitting: Family Medicine

## 2011-12-04 DIAGNOSIS — E785 Hyperlipidemia, unspecified: Secondary | ICD-10-CM | POA: Diagnosis not present

## 2011-12-04 DIAGNOSIS — Z7189 Other specified counseling: Secondary | ICD-10-CM | POA: Diagnosis not present

## 2011-12-04 DIAGNOSIS — I10 Essential (primary) hypertension: Secondary | ICD-10-CM | POA: Diagnosis not present

## 2011-12-04 DIAGNOSIS — E119 Type 2 diabetes mellitus without complications: Secondary | ICD-10-CM | POA: Diagnosis not present

## 2011-12-11 ENCOUNTER — Other Ambulatory Visit: Payer: Self-pay | Admitting: Family Medicine

## 2011-12-14 DIAGNOSIS — G500 Trigeminal neuralgia: Secondary | ICD-10-CM | POA: Diagnosis not present

## 2011-12-24 ENCOUNTER — Ambulatory Visit: Payer: Self-pay | Admitting: Family Medicine

## 2012-02-14 DIAGNOSIS — K1120 Sialoadenitis, unspecified: Secondary | ICD-10-CM | POA: Diagnosis not present

## 2012-02-14 DIAGNOSIS — J320 Chronic maxillary sinusitis: Secondary | ICD-10-CM | POA: Diagnosis not present

## 2012-03-04 DIAGNOSIS — J320 Chronic maxillary sinusitis: Secondary | ICD-10-CM | POA: Diagnosis not present

## 2012-03-04 DIAGNOSIS — K1120 Sialoadenitis, unspecified: Secondary | ICD-10-CM | POA: Diagnosis not present

## 2012-03-12 DIAGNOSIS — IMO0002 Reserved for concepts with insufficient information to code with codable children: Secondary | ICD-10-CM | POA: Diagnosis not present

## 2012-03-12 DIAGNOSIS — M5137 Other intervertebral disc degeneration, lumbosacral region: Secondary | ICD-10-CM | POA: Diagnosis not present

## 2012-03-12 DIAGNOSIS — M999 Biomechanical lesion, unspecified: Secondary | ICD-10-CM | POA: Diagnosis not present

## 2012-03-13 DIAGNOSIS — IMO0002 Reserved for concepts with insufficient information to code with codable children: Secondary | ICD-10-CM | POA: Diagnosis not present

## 2012-03-13 DIAGNOSIS — M999 Biomechanical lesion, unspecified: Secondary | ICD-10-CM | POA: Diagnosis not present

## 2012-03-13 DIAGNOSIS — M5137 Other intervertebral disc degeneration, lumbosacral region: Secondary | ICD-10-CM | POA: Diagnosis not present

## 2012-04-02 ENCOUNTER — Other Ambulatory Visit: Payer: Self-pay | Admitting: Family Medicine

## 2012-04-09 ENCOUNTER — Other Ambulatory Visit: Payer: Self-pay | Admitting: Family Medicine

## 2012-05-13 ENCOUNTER — Ambulatory Visit: Payer: Medicare Other | Admitting: Family Medicine

## 2012-05-20 ENCOUNTER — Ambulatory Visit (INDEPENDENT_AMBULATORY_CARE_PROVIDER_SITE_OTHER): Payer: Medicare Other | Admitting: Family Medicine

## 2012-05-20 ENCOUNTER — Encounter: Payer: Self-pay | Admitting: Family Medicine

## 2012-05-20 VITALS — BP 130/78 | HR 69 | Temp 98.3°F | Ht 63.0 in | Wt 200.8 lb

## 2012-05-20 DIAGNOSIS — F17200 Nicotine dependence, unspecified, uncomplicated: Secondary | ICD-10-CM

## 2012-05-20 DIAGNOSIS — E119 Type 2 diabetes mellitus without complications: Secondary | ICD-10-CM | POA: Diagnosis not present

## 2012-05-20 DIAGNOSIS — I10 Essential (primary) hypertension: Secondary | ICD-10-CM

## 2012-05-20 DIAGNOSIS — M7660 Achilles tendinitis, unspecified leg: Secondary | ICD-10-CM

## 2012-05-20 DIAGNOSIS — E785 Hyperlipidemia, unspecified: Secondary | ICD-10-CM

## 2012-05-20 LAB — BASIC METABOLIC PANEL
CO2: 26 mEq/L (ref 19–32)
Calcium: 9.4 mg/dL (ref 8.4–10.5)
Creatinine, Ser: 0.8 mg/dL (ref 0.4–1.2)
GFR: 71.92 mL/min (ref >60.00)
Glucose, Bld: 168 mg/dL — ABNORMAL HIGH (ref 70–99)

## 2012-05-20 MED ORDER — PREGABALIN 50 MG PO CAPS: 50.0000 mg | ORAL_CAPSULE | Freq: Every day | ORAL | Status: DC

## 2012-05-20 NOTE — Patient Instructions (Addendum)
Achilles Rehab  Begin with easy walking, heel, toe and backwards  Calf raises on a step First lower and then raise on 1 foot If this is painful lower on 1 foot but do the heel raise on both feet  Begin with 3 sets of 10 repetitions  Increase by 5 repetitions every 3 days  Goal is 3 sets of 30 repetitions  Do with both knee straight and knee at 20 degrees of flexion  If pain persists at 3 sets of 30 - add backpack with 5 lbs Increase by 5 lbs per week to max of 30 lbs  

## 2012-05-20 NOTE — Progress Notes (Signed)
Nature conservation officer at [facility] [address] [place] [address] Phone: [phone] Fax: [phone]  Date:  05/20/2012   Name:  Nina Carpenter   DOB:  1945-08-16   MRN:  [id number] Gender: female Age: 67 y.o.  Primary Physician:  [name], MD  Evaluating MD: [name], MD   Chief Complaint: Follow-up   History of Present Illness:  Nina Carpenter is a 67 y.o. pleasant patient who presents with the following:  Allergies are bad, dizzy. Weight gain, but has cut her potatoes. Morning a piece of bread.   Wt Readings from Last 3 Encounters:  05/20/12 200 lb 12 oz (91.06 kg)  11/13/11 194 lb 8 oz (88.225 kg)  08/14/11 178 lb (80.74 kg)   Cracked tooth? Getting dental opinions  BP is better. HTN: Tolerating all medications without side effects Stable and at goal No CP, no sob. No HA.  BP Readings from Last 3 Encounters:  05/20/12 130/78  11/13/11 140/88  08/14/11 150/70    Basic Metabolic Panel:    Component Value Date/Time   NA 138 05/20/2012 1027   K 4.1 05/20/2012 1027   CL 105 05/20/2012 1027   CO2 26 05/20/2012 1027   BUN 18 05/20/2012 1027   CREATININE 0.8 05/20/2012 1027   GLUCOSE 168* 05/20/2012 1027   CALCIUM 9.4 05/20/2012 1027   Diabetes Mellitus: Tolerating Medications: has not wanted meds before Compliance with diet: doing a lot worse, bad Exercise: minimal Avg blood sugars at home: > 200 last night Foot problems: none Hypoglycemia: none No nausea, vomitting, blurred vision, polyuria.  Lab Results  Component Value Date   HGBA1C 7.8* 05/20/2012    Wt Readings from Last 3 Encounters:  05/20/12 200 lb 12 oz (91.06 kg)  11/13/11 194 lb 8 oz (88.225 kg)  08/14/11 178 lb (80.74 kg)    Body mass index is 35.57 kg/(m^2).   Achilles tendonitis: classic insertional, B  Patient Active Problem List  Diagnosis  . DM Type 2  . HYPERLIPIDEMIA  . HYPERTENSION  . ALLERGIC RHINITIS  . ASTHMA  . GERD  . POSTMENOPAUSAL STATUS   . NEURALGIA, TRIGEMINAL  . FATIGUE  . TOBACCO USE  . Hx of rheumatic fever  . Palpitations    Past Medical History  Diagnosis Date  . Allergy   . Asthma   . Diabetes mellitus   . GERD (gastroesophageal reflux disease)   . Hyperlipidemia   . Hypertension   . Kidney stones   . Rheumatic fever   . TMJ arthritis   . Heart murmur   . Ectopic pregnancy   . Hx of rheumatic fever 08/07/2011    Past Surgical History  Procedure Laterality Date  . Cholecystectomy    . Tubal ligation      ectopic preganacy     History   Social History  . Marital Status: Married    Spouse Name: N/A    Number of Children: 2  . Years of Education: N/A   Occupational History  . cosmotologist    Social History Main Topics  . Smoking status: Former Smoker -- 2.00 packs/day    Types: Cigarettes    Quit date: 01/24/1968  . Smokeless tobacco: Never Used  . Alcohol Use: 0.6 oz/week    1 Glasses of wine per week  . Drug Use: No  . Sexually Active: Not on file   Other Topics Concern  . Not on file   Social History Narrative  . No  narrative on file    Family History  Problem Relation Age of Onset  . Stroke Mother   . Diabetes Mother     Allergies  Allergen Reactions  . Fish Allergy     Shell fish  . Penicillins   . Ace Inhibitors Cough    Medication list has been reviewed and updated.  Outpatient Prescriptions Prior to Visit  Medication Sig Dispense Refill  . amLODipine (NORVASC) 5 MG tablet Take 5 mg by mouth daily as needed.      . clidinium-chlordiazePOXIDE (LIBRAX) 2.5-5 MG per capsule TAKE 1 CAPSULE BY MOUTH EVERY MORNING AS NEEDED  30 capsule  5  . famotidine (PEPCID) 10 MG tablet Take 10 mg by mouth 2 (two) times daily as needed.      Marland Kitchen glucose blood (ONE TOUCH ULTRA TEST) test strip Pt checks blood sugar twice a day and as needed.250.00  100 each  11  . Lactase (LACTAID PO) Take by mouth daily.      Marland Kitchen losartan (COZAAR) 100 MG tablet take 1 tablet by mouth once daily   30 tablet  5  . metoprolol succinate (TOPROL-XL) 100 MG 24 hr tablet take 1 tablet by mouth once daily  30 tablet  5  . montelukast (SINGULAIR) 10 MG tablet       . NEXIUM 40 MG capsule take 1 capsule by mouth once daily  30 capsule  10  . ONETOUCH DELICA LANCETS MISC Checking blood sugar twice a day and as needed. 250.00  100 each  11  . pravastatin (PRAVACHOL) 20 MG tablet Take 1 tablet (20 mg total) by mouth daily.  30 tablet  11  . pregabalin (LYRICA) 50 MG capsule Take 50 mg by mouth at bedtime.        . VENTOLIN HFA 108 (90 BASE) MCG/ACT inhaler inhale 2 puffs by mouth every 4 hours if needed for wheezing or shortness of breath  8.5 g  1   No facility-administered medications prior to visit.    Review of Systems:   GEN: No acute illnesses, no fevers, chills. GI: No n/v/d, eating normally Pulm: No SOB Interactive and getting along well at home.  Otherwise, ROS is as per the HPI.   Physical Examination: BP 130/78  Pulse 69  Temp(Src) 98.3 F (36.8 C) (Oral)  Ht 5\' 3"  (1.6 m)  Wt 200 lb 12 oz (91.06 kg)  BMI 35.57 kg/m2  SpO2 98%  Ideal Body Weight: Weight in (lb) to have BMI = 25: 140.8   GEN: WDWN, NAD, Non-toxic, A & O x 3 HEENT: Atraumatic, Normocephalic. Neck supple. No masses, No LAD. Ears and Nose: No external deformity. CV: RRR, No M/G/R. No JVD. No thrill. No extra heart sounds. PULM: CTA B, no wheezes, crackles, rhonchi. No retractions. No resp. distress. No accessory muscle use. EXTR: No c/c/e NEURO Normal gait.  PSYCH: Normally interactive. Conversant. Not depressed or anxious appearing.  Calm demeanor.    Foot: B Echymosis: no Edema: no ROM: full LE B Gait: heel toe, non-antalgic MT pain: no Callus pattern: none Lateral Mall: NT Medial Mall: NT Talus: NT Navicular: NT Cuboid: NT Calcaneous: NT Metatarsals: NT 5th MT: NT Phalanges: NT Achilles: PAINFUL TO PALPATE AT INSERTION ON LEFT, SMALL NODULE Plantar Fascia: NT Fat Pad: NT Peroneals:  NT Post Tib: NT Great Toe: Nml motion Ant Drawer: neg ATFL: NT CFL: NT Deltoid: NT Sensation: intact   Assessment and Plan:  DM Type 2 - Plan: Hemoglobin A1c, Basic  metabolic panel  HYPERTENSION  HYPERLIPIDEMIA  TOBACCO USE  Achilles tendinitis, unspecified laterality  Check all labs BP stable D/c tob Refill lyrica  Achilles Tendinopathy:  Pathophysiology of achilles tendinopathy reviewed. Additionally, I have given the patient the program emphasizing eccentric overloading detailed in the instructions based on Dr. Renato Gails work and protocols..   Orders Today:  Orders Placed This Encounter  Procedures  . Hemoglobin A1c  . Basic metabolic panel    Updated Medication List: (Includes new medications, updates to list, dose adjustments) Meds ordered this encounter  Medications  . pregabalin (LYRICA) 50 MG capsule    Sig: Take 1 capsule (50 mg total) by mouth at bedtime.    Dispense:  30 capsule    Refill:  11    Medications Discontinued: Medications Discontinued During This Encounter  Medication Reason  . pregabalin (LYRICA) 50 MG capsule Reorder      Signed, Karleen Hampshire T. Arther Heisler, MD 05/20/2012 9:58 AM

## 2012-06-06 ENCOUNTER — Other Ambulatory Visit: Payer: Self-pay | Admitting: Family Medicine

## 2012-08-01 ENCOUNTER — Other Ambulatory Visit: Payer: Self-pay

## 2012-10-07 ENCOUNTER — Other Ambulatory Visit: Payer: Self-pay | Admitting: Family Medicine

## 2012-10-14 ENCOUNTER — Encounter: Payer: Self-pay | Admitting: Physician Assistant

## 2012-10-14 ENCOUNTER — Ambulatory Visit (INDEPENDENT_AMBULATORY_CARE_PROVIDER_SITE_OTHER): Payer: Medicare Other | Admitting: Physician Assistant

## 2012-10-14 VITALS — BP 140/90 | HR 123 | Ht 63.0 in | Wt 194.5 lb

## 2012-10-14 DIAGNOSIS — R600 Localized edema: Secondary | ICD-10-CM | POA: Insufficient documentation

## 2012-10-14 DIAGNOSIS — R609 Edema, unspecified: Secondary | ICD-10-CM | POA: Insufficient documentation

## 2012-10-14 DIAGNOSIS — I4891 Unspecified atrial fibrillation: Secondary | ICD-10-CM | POA: Insufficient documentation

## 2012-10-14 DIAGNOSIS — E119 Type 2 diabetes mellitus without complications: Secondary | ICD-10-CM

## 2012-10-14 DIAGNOSIS — R002 Palpitations: Secondary | ICD-10-CM | POA: Diagnosis not present

## 2012-10-14 DIAGNOSIS — E785 Hyperlipidemia, unspecified: Secondary | ICD-10-CM

## 2012-10-14 DIAGNOSIS — I10 Essential (primary) hypertension: Secondary | ICD-10-CM

## 2012-10-14 MED ORDER — POTASSIUM CHLORIDE ER 10 MEQ PO TBCR: 10.0000 meq | EXTENDED_RELEASE_TABLET | Freq: Every day | ORAL | Status: DC

## 2012-10-14 MED ORDER — PRAVASTATIN SODIUM 80 MG PO TABS: 80.0000 mg | ORAL_TABLET | Freq: Every evening | ORAL | Status: DC

## 2012-10-14 MED ORDER — DILTIAZEM HCL ER COATED BEADS 120 MG PO CP24: 120.0000 mg | ORAL_CAPSULE | Freq: Every day | ORAL | Status: DC

## 2012-10-14 MED ORDER — RIVAROXABAN 20 MG PO TABS: 20.0000 mg | ORAL_TABLET | Freq: Every day | ORAL | Status: DC

## 2012-10-14 MED ORDER — FUROSEMIDE 20 MG PO TABS: 20.0000 mg | ORAL_TABLET | Freq: Every day | ORAL | Status: DC

## 2012-10-14 NOTE — Assessment & Plan Note (Signed)
Hgb A1C 7.8% in 04/2012. Initial plan is for diet & exercise. Management per PCP.

## 2012-10-14 NOTE — Assessment & Plan Note (Signed)
She reports myaglias on Crestor and Lipitor. She has been on pravastatin in the past and has tolerated this well. Will restart. Check LFTs, lipid panel.

## 2012-10-14 NOTE — Assessment & Plan Note (Addendum)
The patient's EKG in the office today indicates atrial fibrillation, rate 123 bpm. This is a new diagnosis. Chronicity unclear- she denies palpitations, chest pain or dyspnea today or recently. New symptoms include edema, fatigue and nonproductive cough. Will check pro BNP to evaluate for resultant CHF. Check echo in 2-3 weeks once rate is more adequately controlled. Will add Cardizem CD 120mg  on top of outpatient Toprol-XL. CHADSVASc = at least 4 (HTN, age > 67, DM2, female). Start Xarelto 20mg . Significant time was spent on discussing atrial fibrillation, risk for CVA and possible sequelae including CHF. Will see back in follow-up in 2 weeks. She has been advised to monitor BP and HR at home. If evidence of CHF and a-fib persists despite rate-control, will consider AAD in 3-4 weeks +/- DCCV. Check CMET, CBC, TFTs to evaluate for secondary causes. Have asked patient to request sleep study from her PCP as well.

## 2012-10-14 NOTE — Assessment & Plan Note (Signed)
Well-controlled. Continue current antihypertensives. Monitor BP with the addition of diltiazem.

## 2012-10-14 NOTE — Assessment & Plan Note (Addendum)
Question resultant CHF from newly diagnosed atrial fibrillation. No appreciable edema on exam. Check BNP, electrolytes and renal function on CMET. Add low-dose Lasix + potassium supplementation. Recommended salt restriction, compression stockings/leg elevation, daily weights.

## 2012-10-14 NOTE — Patient Instructions (Addendum)
Please take all new medications as prescribed.   We will schedule an ultrasound in 2-3 weeks.   Please limit fluid intake to less than 2 L per day.   Please limit salt intake to less than 2 grams of sodium per day.   Please wear compression stockings/elevate legs at rest.   Ensure blood pressure is well-controlled.   Avoid excessive EtOH intake.  Avoid excessive use of NSAIDs (such as ibuprofen, Aleve, Advil, Celebrex). Tylenol as needed will not worsen heart failure.   Weigh yourself daily. Take an extra diuretic (water pill) if weight increases by 3 lbs in one day or 5 lbs to two days.   Continue to monitor symptoms- shortness of breath, swelling in legs or abdomen, weight increase, reduced activity level, difficulty breathing while laying flat or waking up in the middle of the night short of breath.   We will see you back in 1-2 weeks.   Please have Dr. Patsy Lager arrange a sleep study to check for sleep apnea.   Monitor your heart rate and blood pressure at home and keep a journal with these numbers.

## 2012-10-14 NOTE — Progress Notes (Signed)
Patient ID: Nina Carpenter, female   DOB: 11/17/1945, 67 y.o.   MRN: [id number]          Date:  10/14/2012   ID:  Nina Carpenter, DOB 03/19/1945, MRN [id number]  PCP:  [name], MD  Primary Cardiologist:  Concha Se, MD   History of Present Illness:  Nina Carpenter is a 67 y.o. female w/ PMHx s/f DM2, HTN, HLD, obesity and GERD/hiatal hernia who presents today for LE edema.   She was last seen by Dr. Mariah Milling 07/2011 for tachycardia, palpitations, both in the setting of indigestion, and also hypertension. The recommendation was made to add propanolol PRN + Holter monitoring should palpitations occur outside of indigestion. BP was well-controlled in the office on ARB, BB therapy. The plan was to start amlodipine if serial BP monitoring at home indicated uncontrolled BP.   She reports experiencing persistent LE edema x 1 week. She reports having edema after a long day at work (works as a Producer, television/film/video, on her feet for most of the day) which resolves in the morning. She has noticed persistent LE edema in the morning. She does note increased fatigue and recent non-productive cough. She denies SOB/DOE, PND, orthopnea, weight increase, chest pain, palpitations, lightheadedness or syncope. BPs at home are well-controlled per patient. No elevated HRs with these readings. She denies thyroid problems, h/o sleep apnea or excessive EtOH abuse. No abnormal bleeding, fevers or chills.  EKG: atrial fibrillation, 123 bpm, Q wave V1, V2 vs poor R wave progression, no ST/T changes  Wt Readings from Last 3 Encounters:  10/14/12 194 lb 8 oz (88.225 kg)  05/20/12 200 lb 12 oz (91.06 kg)  11/13/11 194 lb 8 oz (88.225 kg)     Past Medical History  Diagnosis Date  . Allergy   . Asthma   . Diabetes mellitus   . GERD (gastroesophageal reflux disease)   . Hyperlipidemia   . Hypertension   . Kidney stones   . Rheumatic fever   . TMJ arthritis   . Heart murmur   . Ectopic pregnancy   . Hx of rheumatic  fever 08/07/2011    Current Outpatient Prescriptions  Medication Sig Dispense Refill  . famotidine (PEPCID) 10 MG tablet Take 10 mg by mouth 2 (two) times daily as needed.      Marland Kitchen glucose blood (ONE TOUCH ULTRA TEST) test strip Pt checks blood sugar twice a day and as needed.250.00  100 each  11  . lisinopril (PRINIVIL,ZESTRIL) 10 MG tablet take 1 tablet by mouth at bedtime  30 tablet  3  . losartan (COZAAR) 100 MG tablet take 1 tablet by mouth once daily  30 tablet  5  . metoprolol succinate (TOPROL-XL) 100 MG 24 hr tablet take 1 tablet by mouth once daily  30 tablet  5  . montelukast (SINGULAIR) 10 MG tablet Take 10 mg by mouth as needed.       Letta Pate DELICA LANCETS MISC Checking blood sugar twice a day and as needed. 250.00  100 each  11  . VENTOLIN HFA 108 (90 BASE) MCG/ACT inhaler inhale 2 puffs by mouth every 4 hours if needed for wheezing or shortness of breath  8.5 g  1   No current facility-administered medications for this visit.    Allergies:    Allergies  Allergen Reactions  . Fish Allergy     Shell fish  . Penicillins   . Ace Inhibitors Cough    Social History:  The patient  reports that she quit smoking about 44 years ago. Her smoking use included Cigarettes. She smoked 2.00 packs per day. She has never used smokeless tobacco. She reports that she drinks about 0.6 ounces of alcohol per week. She reports that she does not use illicit drugs.   Family History:  Family History  Problem Relation Age of Onset  . Stroke Mother   . Diabetes Mother     Review of Systems: General: positive for fatigue, negative for chills, fever, night sweats or weight changes.  Cardiovascular: positive for edema, negative for chest pain, dyspnea on exertion, orthopnea, palpitations, paroxysmal nocturnal dyspnea or shortness of breath Dermatological: negative for rash Respiratory: positive for nonproductive cough, negative for wheezing Urologic: negative for hematuria Abdominal:  negative for nausea, vomiting, diarrhea, bright red blood per rectum, melena, or hematemesis Neurologic: negative for visual changes, syncope, or dizziness All other systems reviewed and are otherwise negative except as noted above.  PHYSICAL EXAM: VS:  BP 140/90  Pulse 123  Ht 5\' 3"  (1.6 m)  Wt 194 lb 8 oz (88.225 kg)  BMI 34.46 kg/m2 Well nourished, well developed, in no acute distress HEENT: normal, PERRL Neck: no JVD or bruits Cardiac: irregularly irregular, tachycardic, normal S1, S2; no murmur or gallops Lungs:  clear to auscultation bilaterally, no wheezing, rhonchi or rales Abd: soft, nontender, no hepatomegaly, normoactive BS x 4 quads Ext: no appreciable edema, cyanosis or clubbing Skin: warm and dry, cap refill < 2 sec Neuro:  CNs 2-12 intact, no focal abnormalities noted Musculoskeletal: strength and tone appropriate for age  Psych: normal affect

## 2012-10-15 ENCOUNTER — Encounter: Payer: Self-pay | Admitting: Family Medicine

## 2012-10-15 ENCOUNTER — Ambulatory Visit (INDEPENDENT_AMBULATORY_CARE_PROVIDER_SITE_OTHER): Payer: Medicare Other | Admitting: Family Medicine

## 2012-10-15 VITALS — BP 120/80 | HR 95 | Temp 98.5°F | Ht 63.0 in | Wt 193.8 lb

## 2012-10-15 DIAGNOSIS — I10 Essential (primary) hypertension: Secondary | ICD-10-CM

## 2012-10-15 DIAGNOSIS — I4891 Unspecified atrial fibrillation: Secondary | ICD-10-CM

## 2012-10-15 DIAGNOSIS — J45909 Unspecified asthma, uncomplicated: Secondary | ICD-10-CM | POA: Diagnosis not present

## 2012-10-15 DIAGNOSIS — K0889 Other specified disorders of teeth and supporting structures: Secondary | ICD-10-CM | POA: Insufficient documentation

## 2012-10-15 DIAGNOSIS — K089 Disorder of teeth and supporting structures, unspecified: Secondary | ICD-10-CM

## 2012-10-15 DIAGNOSIS — J309 Allergic rhinitis, unspecified: Secondary | ICD-10-CM

## 2012-10-15 DIAGNOSIS — R071 Chest pain on breathing: Secondary | ICD-10-CM

## 2012-10-15 DIAGNOSIS — R0789 Other chest pain: Secondary | ICD-10-CM | POA: Insufficient documentation

## 2012-10-15 LAB — COMPREHENSIVE METABOLIC PANEL
ALT: 32 U/L (ref 0–35)
Alkaline Phosphatase: 66 U/L (ref 39–117)
CO2: 24 mEq/L (ref 19–32)
Chloride: 105 mEq/L (ref 96–112)
Creatinine, Ser: 0.9 mg/dL (ref 0.4–1.2)
Potassium: 4.6 mEq/L (ref 3.5–5.1)
Sodium: 137 mEq/L (ref 135–145)
Total Bilirubin: 0.9 mg/dL (ref 0.3–1.2)
Total Protein: 7.4 g/dL (ref 6.0–8.3)

## 2012-10-15 LAB — CBC WITH DIFFERENTIAL/PLATELET
Basophils Absolute: 0 10*3/uL (ref 0.0–0.1)
Basophils Relative: 0.5 % (ref 0.0–3.0)
HCT: 48.7 % — ABNORMAL HIGH (ref 36.0–46.0)
Lymphs Abs: 1.8 10*3/uL (ref 0.7–4.0)
Monocytes Absolute: 0.6 10*3/uL (ref 0.1–1.0)
Monocytes Relative: 7 % (ref 3.0–12.0)
Platelets: 273 10*3/uL (ref 150.0–400.0)
RDW: 13.1 % (ref 11.5–14.6)

## 2012-10-15 LAB — TSH: TSH: 0.46 u[IU]/mL (ref 0.35–5.50)

## 2012-10-15 LAB — BRAIN NATRIURETIC PEPTIDE: Pro B Natriuretic peptide (BNP): 273 pg/mL — ABNORMAL HIGH (ref 0.0–100.0)

## 2012-10-15 LAB — T4, FREE: Free T4: 1.15 ng/dL (ref 0.60–1.60)

## 2012-10-15 NOTE — Assessment & Plan Note (Signed)
No obvious abscess/ infection. Recommended pt to return to oral surgeon for further eval.

## 2012-10-15 NOTE — Patient Instructions (Addendum)
Stop at lab to have labs requested by cardiology drawn. Start Xarelto and cardiazem immediately. Keep appt for ECHO once scheduled. Make sure to follow up with cardiology in 1-2 weeks. Restart singulair daily. Can use flexeril for muscle strain if needed. Start regular arm and chest wall stretching.

## 2012-10-15 NOTE — Assessment & Plan Note (Signed)
Likely due to MSK strain. Recommended home PT and can use flexeril prn ( she already has prescription).

## 2012-10-15 NOTE — Assessment & Plan Note (Signed)
Pt had many questions about new diagnosis.  She had not started xarelto or cardiazem but was encouraged to do so immediately .  Discussed purpose and use of these meds. Discussed afib in general and possible causes and need for work up as cardiology recommended.  Somehow she left cards yesterday without doing labs that we reocmmended. We sent her to our lab today to have these drawn. Stressed importance and encouraged her to keep appt for [name] and cardiology follow up.

## 2012-10-15 NOTE — Assessment & Plan Note (Signed)
At goal today. Will watch on new medication.
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
## 2012-10-15 NOTE — Assessment & Plan Note (Signed)
No current flare 

## 2012-10-15 NOTE — Assessment & Plan Note (Signed)
Congestion likely from allergies. Recommended to pt to restart Singulair and stay on during entire allergy season at least.

## 2012-10-16 ENCOUNTER — Telehealth: Payer: Self-pay

## 2012-10-16 ENCOUNTER — Other Ambulatory Visit: Payer: Self-pay

## 2012-10-16 DIAGNOSIS — I10 Essential (primary) hypertension: Secondary | ICD-10-CM

## 2012-10-16 DIAGNOSIS — E119 Type 2 diabetes mellitus without complications: Secondary | ICD-10-CM

## 2012-10-16 DIAGNOSIS — E785 Hyperlipidemia, unspecified: Secondary | ICD-10-CM

## 2012-10-16 NOTE — Telephone Encounter (Signed)
Message copied by Marilynne Halsted on Wed Oct 16, 2012  4:03 PM ------      Message from: Odella Aquas A      Created: Tue Oct 15, 2012  4:09 PM       TSH, free T4 WNL. CMET largely WNL. Pro BNP is mildly elevated. Continue Lasix and potassium. CBC reveals a mild polycythemia. Continue to monitor. Please update patient. ------

## 2012-10-16 NOTE — Telephone Encounter (Signed)
Message copied by Marilynne Halsted on Wed Oct 16, 2012  4:02 PM ------      Message from: Odella Aquas A      Created: Tue Oct 15, 2012  4:09 PM       TSH, free T4 WNL. CMET largely WNL. Pro BNP is mildly elevated. Continue Lasix and potassium. CBC reveals a mild polycythemia. Continue to monitor. Please update patient. ------

## 2012-10-23 ENCOUNTER — Other Ambulatory Visit: Payer: Self-pay | Admitting: Family Medicine

## 2012-10-25 ENCOUNTER — Ambulatory Visit (INDEPENDENT_AMBULATORY_CARE_PROVIDER_SITE_OTHER): Payer: Medicare Other | Admitting: *Deleted

## 2012-10-25 DIAGNOSIS — R0609 Other forms of dyspnea: Secondary | ICD-10-CM | POA: Diagnosis not present

## 2012-10-25 DIAGNOSIS — R609 Edema, unspecified: Secondary | ICD-10-CM

## 2012-10-25 DIAGNOSIS — I10 Essential (primary) hypertension: Secondary | ICD-10-CM

## 2012-10-25 DIAGNOSIS — E785 Hyperlipidemia, unspecified: Secondary | ICD-10-CM

## 2012-10-25 DIAGNOSIS — E119 Type 2 diabetes mellitus without complications: Secondary | ICD-10-CM

## 2012-10-25 DIAGNOSIS — I4891 Unspecified atrial fibrillation: Secondary | ICD-10-CM

## 2012-10-25 MED ORDER — METOPROLOL SUCCINATE ER 100 MG PO TB24: 100.0000 mg | ORAL_TABLET | Freq: Two times a day (BID) | ORAL | Status: DC

## 2012-10-25 NOTE — Telephone Encounter (Signed)
Spoke w/ pt.  She is aware of results.  

## 2012-10-29 ENCOUNTER — Ambulatory Visit (INDEPENDENT_AMBULATORY_CARE_PROVIDER_SITE_OTHER): Payer: Medicare Other | Admitting: Family Medicine

## 2012-10-29 ENCOUNTER — Encounter: Payer: Self-pay | Admitting: Family Medicine

## 2012-10-29 VITALS — BP 140/70 | HR 70 | Temp 98.0°F | Ht 63.0 in | Wt 195.8 lb

## 2012-10-29 DIAGNOSIS — I4891 Unspecified atrial fibrillation: Secondary | ICD-10-CM

## 2012-10-29 DIAGNOSIS — R05 Cough: Secondary | ICD-10-CM | POA: Diagnosis not present

## 2012-10-29 MED ORDER — ALBUTEROL SULFATE HFA 108 (90 BASE) MCG/ACT IN AERS: INHALATION_SPRAY | RESPIRATORY_TRACT | Status: DC

## 2012-10-29 MED ORDER — DOXYCYCLINE HYCLATE 100 MG PO TABS: 100.0000 mg | ORAL_TABLET | Freq: Two times a day (BID) | ORAL | Status: DC

## 2012-10-29 NOTE — Assessment & Plan Note (Signed)
Most likely viral infeciton... Treat symptomatically. If not improving as expected.. fill antibitoics.  use albuterol prn wheeze.

## 2012-10-29 NOTE — Patient Instructions (Addendum)
Use mucinex DM (not decongestant) twice daily. Start nasal saline irrigation/ nasal spray 2-3 day. USe albuterol for wheeze/Shortness of breath as needed. Rx sent to pharmacy. If not improving in next 3-4  as expected fill antibiotics.

## 2012-10-29 NOTE — Progress Notes (Signed)
  Subjective:    Patient ID: Nina Carpenter, female    DOB: [birth year], 67 y.o.   MRN: [id number]  Cough This is a new problem. The current episode started in the past 7 days. The problem has been gradually worsening. The cough is productive of sputum. Associated symptoms include ear congestion, headaches, nasal congestion and wheezing. Pertinent negatives include no chills, ear pain, fever or shortness of breath. Nothing aggravates the symptoms. She has tried a beta-agonist inhaler (using mucinex, has not used inhaler but felt like she needed.) for the symptoms. The treatment provided mild relief. Her past medical history is significant for asthma. former smoker      Review of Systems  Constitutional: Negative for fever and chills.  HENT: Negative for ear pain.   Respiratory: Positive for cough and wheezing. Negative for shortness of breath.   Neurological: Positive for headaches.       Objective:   Physical Exam  Constitutional: Vital signs are normal. She appears well-developed and well-nourished. She is cooperative.  Non-toxic appearance. She does not appear ill. No distress.  HENT:  Head: Normocephalic.  Right Ear: Hearing, tympanic membrane, external ear and ear canal normal. Tympanic membrane is not erythematous, not retracted and not bulging.  Left Ear: Hearing, tympanic membrane, external ear and ear canal normal. Tympanic membrane is not erythematous, not retracted and not bulging.  Nose: Mucosal edema and rhinorrhea present. Right sinus exhibits no maxillary sinus tenderness and no frontal sinus tenderness. Left sinus exhibits no maxillary sinus tenderness and no frontal sinus tenderness.  Mouth/Throat: Uvula is midline, oropharynx is clear and moist and mucous membranes are normal.  Eyes: Conjunctivae, EOM and lids are normal. Pupils are equal, round, and reactive to light. Lids are everted and swept, no foreign bodies found.  Neck: Trachea normal and normal range of motion.  Neck supple. Carotid bruit is not present. No mass and no thyromegaly present.  Cardiovascular: Normal rate, regular rhythm, S1 normal, S2 normal, normal heart sounds, intact distal pulses and normal pulses.  Exam reveals no gallop and no friction rub.   No murmur heard. Pulmonary/Chest: Effort normal and breath sounds normal. Not tachypneic. No respiratory distress. She has no decreased breath sounds. She has no wheezes. She has no rhonchi. She has no rales.  Neurological: She is alert.  Skin: Skin is warm, dry and intact. No rash noted.  Psychiatric: Her speech is normal and behavior is normal. Judgment normal. Her mood appears not anxious. Cognition and memory are normal. She does not exhibit a depressed mood.          Assessment & Plan:

## 2012-10-29 NOTE — Assessment & Plan Note (Signed)
Encouraged pt to start xarelto.

## 2012-10-30 ENCOUNTER — Telehealth: Payer: Self-pay

## 2012-10-30 NOTE — Telephone Encounter (Signed)
Message copied by Marilynne Halsted on Wed Oct 30, 2012  9:59 AM ------      Message from: Nina Carpenter      Created: Mon Oct 28, 2012  9:47 AM       Normal EF 60-65%. No mention of diastolic dysfunction. Mild LA dilatation (explaining Carpenter-fib) and mild MR. Please update patient. Thx ------

## 2012-10-30 NOTE — Telephone Encounter (Signed)
Spoke w/ pt.  She is aware of results.  

## 2012-11-04 ENCOUNTER — Ambulatory Visit (INDEPENDENT_AMBULATORY_CARE_PROVIDER_SITE_OTHER): Payer: Medicare Other | Admitting: Cardiovascular Disease

## 2012-11-04 ENCOUNTER — Encounter: Payer: Self-pay | Admitting: Cardiovascular Disease

## 2012-11-04 VITALS — BP 140/80 | HR 68 | Ht 63.0 in | Wt 193.0 lb

## 2012-11-04 DIAGNOSIS — I10 Essential (primary) hypertension: Secondary | ICD-10-CM | POA: Diagnosis not present

## 2012-11-04 DIAGNOSIS — F17200 Nicotine dependence, unspecified, uncomplicated: Secondary | ICD-10-CM | POA: Diagnosis not present

## 2012-11-04 DIAGNOSIS — E785 Hyperlipidemia, unspecified: Secondary | ICD-10-CM | POA: Diagnosis not present

## 2012-11-04 DIAGNOSIS — I4891 Unspecified atrial fibrillation: Secondary | ICD-10-CM

## 2012-11-04 DIAGNOSIS — R609 Edema, unspecified: Secondary | ICD-10-CM

## 2012-11-04 NOTE — Patient Instructions (Signed)
You are doing well. No medication changes were made.  Monitor your blood pressure and heart rhythm closely Call the office for error messages on your blood pressure cuff. Take aspirin daily  Take lasix with potassium for leg swelling, excess salt intake   Please call us if you have new issues that need to be addressed before your next appt.  Your physician wants you to follow-up in: 6 months.  You will receive a reminder letter in the mail two months in advance. If you don't receive a letter, please call our office to schedule the follow-up appointment.

## 2012-11-04 NOTE — Assessment & Plan Note (Signed)
Recent edema likely secondary to diastolic CHF. Encouraged her to take Lasix for any edema with potassium

## 2012-11-04 NOTE — Progress Notes (Signed)
Patient ID: Nina Carpenter, female    DOB: 07-Jul-1945, 67 y.o.   MRN: [id number]  HPI Comments: DARREN NODAL is a 67 y.o. female w/ PMHx s/f DM2, HTN, HLD, obesity and GERD/hiatal hernia who presents today for routine followup.  She presented 10/14/2012 with lower extremity edema, abdominal fullness, shortness of breath, found to be in atrial fibrillation. She was started on diltiazem 120 mg daily. Also recommended to start anticoagulation. Notes indicate she did not want to start anticoagulation as she had a friend who had a significant bleed and died.  She had an echocardiogram October 3 at which time she was in atrial fibrillation. This showed normal ejection fraction, mildly dilated left atrium otherwise normal study.  Since then she has felt well. Less palpitations, less fullness in her abdomen, less lower extremity swelling.  When she was in atrial fibrillation, she noted an error on her blood pressure cuff.  Blood pressure cuff now reporting normally  She does eat out frequently, takes Lasix periodically for edema prn  EKG: Normal sinus rhythm with rate 68 beats per minute, no significant ST or T wave changes   Outpatient Encounter Prescriptions as of 11/04/2012  Medication Sig Dispense Refill  . albuterol (VENTOLIN HFA) 108 (90 BASE) MCG/ACT inhaler inhale 2 puffs by mouth every 4 hours if needed for wheezing or shortness of breath  8.5 g  1  . diltiazem (CARDIZEM CD) 120 MG 24 hr capsule Take 1 capsule (120 mg total) by mouth daily.  90 capsule  3  . doxycycline (VIBRA-TABS) 100 MG tablet Take 1 tablet (100 mg total) by mouth 2 (two) times daily.  20 tablet  0  . famotidine (PEPCID) 10 MG tablet Take 10 mg by mouth 2 (two) times daily as needed.      . furosemide (LASIX) 20 MG tablet Take 1 tablet (20 mg total) by mouth daily.  90 tablet  3  . glucose blood (ONE TOUCH ULTRA TEST) test strip Pt checks blood sugar twice a day and as needed.250.00  100 each  11  . losartan  (COZAAR) 100 MG tablet take 1 tablet by mouth once daily  30 tablet  5  . metoprolol succinate (TOPROL-XL) 100 MG 24 hr tablet Take 1 tablet (100 mg total) by mouth 2 (two) times daily. Take with or immediately following a meal.  120 tablet  3  . montelukast (SINGULAIR) 10 MG tablet Take 10 mg by mouth as needed.       Letta Pate DELICA LANCETS MISC Checking blood sugar twice a day and as needed. 250.00  100 each  11  . potassium chloride (K-DUR) 10 MEQ tablet Take 1 tablet (10 mEq total) by mouth daily.  90 tablet  3  . pravastatin (PRAVACHOL) 80 MG tablet Take 1 tablet (80 mg total) by mouth every evening.  90 tablet  3  . Rivaroxaban (XARELTO) 20 MG TABS tablet Take by mouth daily.       No facility-administered encounter medications on file as of 11/04/2012.     Review of Systems  Constitutional: Negative.   HENT: Negative.   Eyes: Negative.   Respiratory: Negative.   Cardiovascular: Negative.   Gastrointestinal: Negative.   Endocrine: Negative.   Musculoskeletal: Negative.   Skin: Negative.   Allergic/Immunologic: Negative.   Neurological: Negative.   Hematological: Negative.   Psychiatric/Behavioral: Negative.   All other systems reviewed and are negative.    BP 140/80  Pulse 68  Ht 5\' 3"  (  1.6 m)  Wt 193 lb (87.544 kg)  BMI 34.2 kg/m2  Physical Exam  Nursing note and vitals reviewed. Constitutional: She is oriented to person, place, and time. She appears well-developed and well-nourished.  HENT:  Head: Normocephalic.  Nose: Nose normal.  Mouth/Throat: Oropharynx is clear and moist.  Eyes: Conjunctivae are normal. Pupils are equal, round, and reactive to light.  Neck: Normal range of motion. Neck supple. No JVD present.  Cardiovascular: Normal rate, regular rhythm, S1 normal, S2 normal, normal heart sounds and intact distal pulses.  Exam reveals no gallop and no friction rub.   No murmur heard. Pulmonary/Chest: Effort normal and breath sounds normal. No respiratory  distress. She has no wheezes. She has no rales. She exhibits no tenderness.  Abdominal: Soft. Bowel sounds are normal. She exhibits no distension. There is no tenderness.  Musculoskeletal: Normal range of motion. She exhibits no edema and no tenderness.  Lymphadenopathy:    She has no cervical adenopathy.  Neurological: She is alert and oriented to person, place, and time. Coordination normal.  Skin: Skin is warm and dry. No rash noted. No erythema.  Psychiatric: She has a normal mood and affect. Her behavior is normal. Judgment and thought content normal.    Assessment and Plan

## 2012-11-04 NOTE — Assessment & Plan Note (Signed)
In the past week, she has converted to normal sinus rhythm. We have discussed the risk and benefit of not being on anticoagulation. She is indicated that she does not want xarelto.  she prefers to take aspirin at this time. Again she reports having a friend who had significant bleed and died from being on blood thinners. We have asked her to closely monitor her rhythm with her blood pressure cuff if this reports an error when she is in atrial fibrillation. We'll continue beta blocker and diltiazem

## 2012-11-04 NOTE — Assessment & Plan Note (Signed)
Encouraged her to stay on her statin 

## 2012-11-04 NOTE — Assessment & Plan Note (Signed)
Encouraged continued smoking cessation 

## 2012-11-04 NOTE — Assessment & Plan Note (Signed)
Blood pressure is well controlled on today's visit. No changes made to the medications. 

## 2012-11-13 ENCOUNTER — Telehealth: Payer: Self-pay

## 2012-11-13 NOTE — Telephone Encounter (Signed)
Spoke w/ pt.  States that she had indigestion last night and her BP was up.   As soon as indigestion went away, her BP went down.   She will contact her PCP to discuss reflux meds.

## 2012-11-13 NOTE — Telephone Encounter (Signed)
Yes, restart nexium

## 2012-11-13 NOTE — Telephone Encounter (Signed)
Pt said last night BP was 142/81 P 89 and pt had a lot of gas and reflux. Pt took OTC Pepcid and symptoms resolved and BP went down. Pt is at work now and did not take BP this morning but pt said no problem this morning with gas or reflux but wanted to know if should restart Nexium once daily. Pt said she does not need refill if Dr Patsy Lager wants pt to restart Nexium because she has med at home. Nexium more effective than Pepcid OTC. Pt not having any CP, SOB, H/A or dizziness.Please advise.

## 2012-11-13 NOTE — Telephone Encounter (Signed)
Pt called states her BP is 142/81, HR 89. She thinks this is too high. Please call.

## 2012-11-13 NOTE — Progress Notes (Signed)
N/a pt had echo

## 2012-11-13 NOTE — Telephone Encounter (Signed)
Ms. Wardell notified to restart Nexium per Dr. Patsy Lager.

## 2012-11-18 ENCOUNTER — Ambulatory Visit (INDEPENDENT_AMBULATORY_CARE_PROVIDER_SITE_OTHER): Payer: Medicare Other | Admitting: Family Medicine

## 2012-11-18 ENCOUNTER — Encounter: Payer: Self-pay | Admitting: Family Medicine

## 2012-11-18 VITALS — BP 146/78 | HR 68 | Temp 98.6°F | Ht 63.0 in | Wt 192.2 lb

## 2012-11-18 DIAGNOSIS — Z23 Encounter for immunization: Secondary | ICD-10-CM

## 2012-11-18 DIAGNOSIS — E119 Type 2 diabetes mellitus without complications: Secondary | ICD-10-CM | POA: Diagnosis not present

## 2012-11-18 DIAGNOSIS — G500 Trigeminal neuralgia: Secondary | ICD-10-CM

## 2012-11-18 DIAGNOSIS — I4891 Unspecified atrial fibrillation: Secondary | ICD-10-CM

## 2012-11-18 DIAGNOSIS — E785 Hyperlipidemia, unspecified: Secondary | ICD-10-CM | POA: Diagnosis not present

## 2012-11-18 DIAGNOSIS — I10 Essential (primary) hypertension: Secondary | ICD-10-CM | POA: Diagnosis not present

## 2012-11-18 LAB — HEMOGLOBIN A1C: Hgb A1c MFr Bld: 8.1 % — ABNORMAL HIGH (ref 4.6–6.5)

## 2012-11-18 LAB — LIPID PANEL
Cholesterol: 245 mg/dL — ABNORMAL HIGH (ref 0–200)
HDL: 45.1 mg/dL (ref >39.00)
Total CHOL/HDL Ratio: 5
VLDL: 35.2 mg/dL (ref 0.0–40.0)

## 2012-11-18 NOTE — Patient Instructions (Signed)
F/u 6 months

## 2012-11-18 NOTE — Progress Notes (Signed)
Date:  11/18/2012   Name:  Nina Carpenter   DOB:  Jul 20, 1945   MRN:  [id number] Gender: female Age: 67 y.o.  Primary Physician:  [name], MD   Chief Complaint: Follow-up   History of Present Illness:  Nina Carpenter is a 67 y.o. pleasant patient who presents with the following:  AF: on b-blocker and ca channel blocker. Long discussion, taking aspirin, but she decidedly does not want to take any blood thinners.  Diabetes Mellitus: she wanted to stop her medication Compliance with diet: fair/poor Exercise: minimal Avg blood sugars at home: unk Foot problems: none Hypoglycemia: none No nausea, vomitting, blurred vision, polyuria.  Lab Results  Component Value Date   HGBA1C 8.1* 11/18/2012    Wt Readings from Last 3 Encounters:  11/18/12 192 lb 4 oz (87.204 kg)  11/04/12 193 lb (87.544 kg)  10/29/12 195 lb 12 oz (88.792 kg)    Body mass index is 34.06 kg/(m^2).   HTN: Tolerating all medications without side effects Stable and at goal No CP, no sob. No HA.  BP Readings from Last 3 Encounters:  11/18/12 146/78  11/04/12 140/80  10/29/12 140/70    Basic Metabolic Panel:    Component Value Date/Time   NA 137 10/15/2012 1155   K 4.6 10/15/2012 1155   CL 105 10/15/2012 1155   CO2 24 10/15/2012 1155   BUN 15 10/15/2012 1155   CREATININE 0.9 10/15/2012 1155   GLUCOSE 247* 10/15/2012 1155   CALCIUM 9.7 10/15/2012 1155   Lipids: Doing well, stable. Taking meds, 1/2 a pill "sometimes" Panel reviewed with patient.  Lipids:    Component Value Date/Time   CHOL 245* 11/18/2012 1049   TRIG 176.0* 11/18/2012 1049   HDL 45.10 11/18/2012 1049   LDLDIRECT 178.0 11/18/2012 1049   VLDL 35.2 11/18/2012 1049   CHOLHDL 5 11/18/2012 1049    Lab Results  Component Value Date   ALT 32 10/15/2012   AST 21 10/15/2012   ALKPHOS 66 10/15/2012   BILITOT 0.9 10/15/2012     Fever blister.   Patient Active Problem List   Diagnosis Date Noted  . Cough 10/29/2012  .  Tooth pain 10/15/2012  . Acute chest wall pain 10/15/2012  . New onset atrial fibrillation 10/14/2012  . Edema 10/14/2012  . Palpitations 08/10/2011  . Hx of rheumatic fever 08/07/2011  . TOBACCO USE 03/17/2010  . NEURALGIA, TRIGEMINAL 01/26/2010  . FATIGUE 01/26/2010  . DM Type 2 05/04/2008  . HYPERLIPIDEMIA 05/04/2008  . HYPERTENSION 05/04/2008  . ALLERGIC RHINITIS 05/04/2008  . ASTHMA 05/04/2008  . GERD 05/04/2008  . POSTMENOPAUSAL STATUS 05/04/2008    Past Medical History  Diagnosis Date  . Allergy   . Asthma   . Diabetes mellitus   . GERD (gastroesophageal reflux disease)   . Hyperlipidemia   . Hypertension   . Kidney stones   . Rheumatic fever   . TMJ arthritis   . Heart murmur   . Ectopic pregnancy   . Hx of rheumatic fever 08/07/2011    Past Surgical History  Procedure Laterality Date  . Cholecystectomy    . Tubal ligation      ectopic preganacy     History   Social History  . Marital Status: Married    Spouse Name: N/A    Number of Children: 2  . Years of Education: N/A   Occupational History  . cosmotologist    Social History Main Topics  . Smoking status: Former Smoker --  2.00 packs/day    Types: Cigarettes    Quit date: 01/24/1968  . Smokeless tobacco: Never Used  . Alcohol Use: 0.6 oz/week    1 Glasses of wine per week  . Drug Use: No  . Sexual Activity: Not on file   Other Topics Concern  . Not on file   Social History Narrative  . No narrative on file    Family History  Problem Relation Age of Onset  . Stroke Mother   . Diabetes Mother     Allergies  Allergen Reactions  . Fish Allergy     Shell fish  . Penicillins   . Ace Inhibitors Cough    Medication list has been reviewed and updated.  Outpatient Prescriptions Prior to Visit  Medication Sig Dispense Refill  . albuterol (VENTOLIN HFA) 108 (90 BASE) MCG/ACT inhaler inhale 2 puffs by mouth every 4 hours if needed for wheezing or shortness of breath  8.5 g  1  .  diltiazem (CARDIZEM CD) 120 MG 24 hr capsule Take 1 capsule (120 mg total) by mouth daily.  90 capsule  3  . famotidine (PEPCID) 10 MG tablet Take 10 mg by mouth 2 (two) times daily as needed.      . furosemide (LASIX) 20 MG tablet Take 1 tablet (20 mg total) by mouth daily.  90 tablet  3  . glucose blood (ONE TOUCH ULTRA TEST) test strip Pt checks blood sugar twice a day and as needed.250.00  100 each  11  . losartan (COZAAR) 100 MG tablet take 1 tablet by mouth once daily  30 tablet  5  . metoprolol succinate (TOPROL-XL) 100 MG 24 hr tablet Take 1 tablet (100 mg total) by mouth 2 (two) times daily. Take with or immediately following a meal.  120 tablet  3  . montelukast (SINGULAIR) 10 MG tablet Take 10 mg by mouth as needed.       Letta Pate DELICA LANCETS MISC Checking blood sugar twice a day and as needed. 250.00  100 each  11  . potassium chloride (K-DUR) 10 MEQ tablet Take 1 tablet (10 mEq total) by mouth daily.  90 tablet  3  . pravastatin (PRAVACHOL) 80 MG tablet Take 1 tablet (80 mg total) by mouth every evening.  90 tablet  3  . doxycycline (VIBRA-TABS) 100 MG tablet Take 1 tablet (100 mg total) by mouth 2 (two) times daily.  20 tablet  0  . Rivaroxaban (XARELTO) 20 MG TABS tablet Take by mouth daily.       No facility-administered medications prior to visit.    Review of Systems:   GEN: No acute illnesses, no fevers, chills. GI: No n/v/d, eating normally Pulm: No SOB Interactive and getting along well at home.  Otherwise, ROS is as per the HPI.   Physical Examination: BP 146/78  Pulse 68  Temp(Src) 98.6 F (37 C) (Oral)  Ht 5\' 3"  (1.6 m)  Wt 192 lb 4 oz (87.204 kg)  BMI 34.06 kg/m2  Ideal Body Weight: Weight in (lb) to have BMI = 25: 140.8   GEN: WDWN, NAD, Non-toxic, A & O x 3 HEENT: Atraumatic, Normocephalic. Neck supple. No masses, No LAD. Ears and Nose: No external deformity. CV: RRR, No M/G/R. No JVD. No thrill. No extra heart sounds. PULM: CTA B, no wheezes,  crackles, rhonchi. No retractions. No resp. distress. No accessory muscle use. EXTR: No c/c/e NEURO Normal gait.  PSYCH: Normally interactive. Conversant. Not depressed or anxious appearing.  Calm demeanor.    Assessment and Plan:  DM Type 2 - Plan: Hemoglobin A1c: uncontrolled, restart metformin xr 500 mg a day  HYPERLIPIDEMIA - Plan: Lipid panel: unstable, increase to at least 1/2 tab daily if at all possible  HYPERTENSION: better than before  Need for prophylactic vaccination and inoculation against influenza - Plan: Flu Vaccine QUAD 36+ mos PF IM (Fluarix)  New onset atrial fibrillation: NSR now. On ASA. Does not want coumadin, xarelto, etc.  NEURALGIA, TRIGEMINAL: symptoms returning. Trial of lyrica at night, if helps, we can represcribe and titrate up dose.  Orders Today:  Orders Placed This Encounter  Procedures  . Flu Vaccine QUAD 36+ mos PF IM (Fluarix)  . Hemoglobin A1c  . Lipid panel  . LDL cholesterol, direct    Updated Medication List: (Includes new medications, updates to list, dose adjustments) No orders of the defined types were placed in this encounter.    Medications Discontinued: Medications Discontinued During This Encounter  Medication Reason  . doxycycline (VIBRA-TABS) 100 MG tablet Completed Course  . Rivaroxaban (XARELTO) 20 MG TABS tablet Discontinued by provider      Signed,  Elpidio Galea. Talissa Apple, MD, CAQ Sports Medicine  [facility] [address] [place] [address] Phone: [phone] Fax: [phone]

## 2012-11-19 DIAGNOSIS — M999 Biomechanical lesion, unspecified: Secondary | ICD-10-CM | POA: Diagnosis not present

## 2012-11-19 DIAGNOSIS — M5137 Other intervertebral disc degeneration, lumbosacral region: Secondary | ICD-10-CM | POA: Diagnosis not present

## 2012-11-19 DIAGNOSIS — M9981 Other biomechanical lesions of cervical region: Secondary | ICD-10-CM | POA: Diagnosis not present

## 2012-11-19 DIAGNOSIS — M5030 Other cervical disc degeneration, unspecified cervical region: Secondary | ICD-10-CM | POA: Diagnosis not present

## 2012-11-19 MED ORDER — METFORMIN HCL ER 500 MG PO TB24: 500.0000 mg | ORAL_TABLET | Freq: Every day | ORAL | Status: DC

## 2012-11-19 NOTE — Addendum Note (Signed)
Addended by: Damita Lack on: 11/19/2012 04:25 PM   Modules accepted: Orders

## 2012-12-05 ENCOUNTER — Other Ambulatory Visit: Payer: Self-pay | Admitting: *Deleted

## 2012-12-05 DIAGNOSIS — I4891 Unspecified atrial fibrillation: Secondary | ICD-10-CM

## 2012-12-05 MED ORDER — METOPROLOL SUCCINATE ER 100 MG PO TB24: 100.0000 mg | ORAL_TABLET | Freq: Two times a day (BID) | ORAL | Status: DC

## 2012-12-24 DIAGNOSIS — M5137 Other intervertebral disc degeneration, lumbosacral region: Secondary | ICD-10-CM | POA: Diagnosis not present

## 2012-12-24 DIAGNOSIS — M9981 Other biomechanical lesions of cervical region: Secondary | ICD-10-CM | POA: Diagnosis not present

## 2012-12-24 DIAGNOSIS — M5030 Other cervical disc degeneration, unspecified cervical region: Secondary | ICD-10-CM | POA: Diagnosis not present

## 2012-12-24 DIAGNOSIS — M999 Biomechanical lesion, unspecified: Secondary | ICD-10-CM | POA: Diagnosis not present

## 2012-12-28 ENCOUNTER — Emergency Department (HOSPITAL_COMMUNITY): Payer: Medicare Other

## 2012-12-28 ENCOUNTER — Inpatient Hospital Stay (HOSPITAL_COMMUNITY)
Admission: EM | Admit: 2012-12-28 | Discharge: 2013-01-01 | DRG: 041 | Disposition: A | Discharge status: Home / Self Care | Payer: Medicare Other | Attending: Internal Medicine | Admitting: Internal Medicine

## 2012-12-28 ENCOUNTER — Inpatient Hospital Stay (HOSPITAL_COMMUNITY): Payer: Medicare Other

## 2012-12-28 ENCOUNTER — Encounter (HOSPITAL_COMMUNITY): Payer: Self-pay | Admitting: Emergency Medicine

## 2012-12-28 DIAGNOSIS — E785 Hyperlipidemia, unspecified: Secondary | ICD-10-CM | POA: Diagnosis present

## 2012-12-28 DIAGNOSIS — R29810 Facial weakness: Secondary | ICD-10-CM

## 2012-12-28 DIAGNOSIS — Z91013 Allergy to seafood: Secondary | ICD-10-CM

## 2012-12-28 DIAGNOSIS — Z9089 Acquired absence of other organs: Secondary | ICD-10-CM | POA: Diagnosis not present

## 2012-12-28 DIAGNOSIS — Z7901 Long term (current) use of anticoagulants: Secondary | ICD-10-CM

## 2012-12-28 DIAGNOSIS — J45909 Unspecified asthma, uncomplicated: Secondary | ICD-10-CM | POA: Diagnosis present

## 2012-12-28 DIAGNOSIS — G9389 Other specified disorders of brain: Secondary | ICD-10-CM | POA: Diagnosis not present

## 2012-12-28 DIAGNOSIS — R918 Other nonspecific abnormal finding of lung field: Secondary | ICD-10-CM | POA: Diagnosis not present

## 2012-12-28 DIAGNOSIS — E669 Obesity, unspecified: Secondary | ICD-10-CM | POA: Diagnosis present

## 2012-12-28 DIAGNOSIS — J9819 Other pulmonary collapse: Secondary | ICD-10-CM | POA: Diagnosis not present

## 2012-12-28 DIAGNOSIS — K219 Gastro-esophageal reflux disease without esophagitis: Secondary | ICD-10-CM | POA: Diagnosis present

## 2012-12-28 DIAGNOSIS — Z79899 Other long term (current) drug therapy: Secondary | ICD-10-CM

## 2012-12-28 DIAGNOSIS — Z950 Presence of cardiac pacemaker: Secondary | ICD-10-CM | POA: Diagnosis not present

## 2012-12-28 DIAGNOSIS — I495 Sick sinus syndrome: Secondary | ICD-10-CM

## 2012-12-28 DIAGNOSIS — K449 Diaphragmatic hernia without obstruction or gangrene: Secondary | ICD-10-CM | POA: Diagnosis present

## 2012-12-28 DIAGNOSIS — Z888 Allergy status to other drugs, medicaments and biological substances status: Secondary | ICD-10-CM

## 2012-12-28 DIAGNOSIS — Z833 Family history of diabetes mellitus: Secondary | ICD-10-CM

## 2012-12-28 DIAGNOSIS — I10 Essential (primary) hypertension: Secondary | ICD-10-CM | POA: Diagnosis present

## 2012-12-28 DIAGNOSIS — I5030 Unspecified diastolic (congestive) heart failure: Secondary | ICD-10-CM | POA: Diagnosis not present

## 2012-12-28 DIAGNOSIS — Z6834 Body mass index (BMI) 34.0-34.9, adult: Secondary | ICD-10-CM

## 2012-12-28 DIAGNOSIS — Z880 Allergy status to penicillin: Secondary | ICD-10-CM

## 2012-12-28 DIAGNOSIS — Z9851 Tubal ligation status: Secondary | ICD-10-CM

## 2012-12-28 DIAGNOSIS — E876 Hypokalemia: Secondary | ICD-10-CM | POA: Diagnosis present

## 2012-12-28 DIAGNOSIS — R4701 Aphasia: Secondary | ICD-10-CM | POA: Diagnosis present

## 2012-12-28 DIAGNOSIS — Z7982 Long term (current) use of aspirin: Secondary | ICD-10-CM

## 2012-12-28 DIAGNOSIS — I6340 Cerebral infarction due to embolism of unspecified cerebral artery: Secondary | ICD-10-CM | POA: Diagnosis not present

## 2012-12-28 DIAGNOSIS — Z87891 Personal history of nicotine dependence: Secondary | ICD-10-CM

## 2012-12-28 DIAGNOSIS — I509 Heart failure, unspecified: Secondary | ICD-10-CM | POA: Diagnosis not present

## 2012-12-28 DIAGNOSIS — I4891 Unspecified atrial fibrillation: Secondary | ICD-10-CM | POA: Diagnosis present

## 2012-12-28 DIAGNOSIS — R471 Dysarthria and anarthria: Secondary | ICD-10-CM | POA: Diagnosis not present

## 2012-12-28 DIAGNOSIS — I639 Cerebral infarction, unspecified: Secondary | ICD-10-CM | POA: Diagnosis present

## 2012-12-28 DIAGNOSIS — E119 Type 2 diabetes mellitus without complications: Secondary | ICD-10-CM | POA: Diagnosis present

## 2012-12-28 DIAGNOSIS — R1310 Dysphagia, unspecified: Secondary | ICD-10-CM

## 2012-12-28 DIAGNOSIS — Z823 Family history of stroke: Secondary | ICD-10-CM

## 2012-12-28 DIAGNOSIS — I672 Cerebral atherosclerosis: Secondary | ICD-10-CM | POA: Diagnosis present

## 2012-12-28 DIAGNOSIS — I6350 Cerebral infarction due to unspecified occlusion or stenosis of unspecified cerebral artery: Secondary | ICD-10-CM | POA: Diagnosis not present

## 2012-12-28 HISTORY — DX: Type 2 diabetes mellitus without complications: E11.9

## 2012-12-28 HISTORY — DX: Obesity, unspecified: E66.9

## 2012-12-28 HISTORY — DX: Paroxysmal atrial fibrillation: I48.0

## 2012-12-28 LAB — POCT I-STAT, CHEM 8
Creatinine, Ser: 1 mg/dL (ref 0.50–1.10)
HCT: 47 % — ABNORMAL HIGH (ref 36.0–46.0)
Hemoglobin: 16 g/dL — ABNORMAL HIGH (ref 12.0–15.0)
Potassium: 3.1 mEq/L — ABNORMAL LOW (ref 3.5–5.1)
Sodium: 141 mEq/L (ref 135–145)

## 2012-12-28 LAB — URINALYSIS, ROUTINE W REFLEX MICROSCOPIC
Bilirubin Urine: NEGATIVE
Ketones, ur: NEGATIVE mg/dL
Nitrite: NEGATIVE
Protein, ur: NEGATIVE mg/dL
Specific Gravity, Urine: 1.004 — ABNORMAL LOW (ref 1.005–1.030)
Urobilinogen, UA: 0.2 mg/dL (ref 0.0–1.0)

## 2012-12-28 LAB — COMPREHENSIVE METABOLIC PANEL
ALT: 22 U/L (ref 0–35)
AST: 20 U/L (ref 0–37)
CO2: 20 mEq/L (ref 19–32)
Calcium: 9.2 mg/dL (ref 8.4–10.5)
Chloride: 100 mEq/L (ref 96–112)
GFR calc non Af Amer: 85 mL/min — ABNORMAL LOW (ref >90)
Sodium: 138 mEq/L (ref 135–145)
Total Bilirubin: 0.5 mg/dL (ref 0.3–1.2)
Total Protein: 7.5 g/dL (ref 6.0–8.3)

## 2012-12-28 LAB — DIFFERENTIAL
Basophils Absolute: 0 10*3/uL (ref 0.0–0.1)
Eosinophils Relative: 1 % (ref 0–5)
Lymphocytes Relative: 25 % (ref 12–46)
Neutro Abs: 5.1 10*3/uL (ref 1.7–7.7)
Neutrophils Relative %: 68 % (ref 43–77)

## 2012-12-28 LAB — RAPID URINE DRUG SCREEN, HOSP PERFORMED
Barbiturates: NOT DETECTED
Benzodiazepines: NOT DETECTED
Cocaine: NOT DETECTED
Tetrahydrocannabinol: NOT DETECTED

## 2012-12-28 LAB — CBC
HCT: 43.4 % (ref 36.0–46.0)
MCHC: 34.6 g/dL (ref 30.0–36.0)
MCV: 90 fL (ref 78.0–100.0)
Platelets: 213 10*3/uL (ref 150–400)
RBC: 4.82 MIL/uL (ref 3.87–5.11)
RDW: 12.6 % (ref 11.5–15.5)
WBC: 7.5 10*3/uL (ref 4.0–10.5)

## 2012-12-28 LAB — ETHANOL: Alcohol, Ethyl (B): <11 mg/dL (ref 0–11)

## 2012-12-28 LAB — APTT: aPTT: 26 seconds (ref 24–37)

## 2012-12-28 LAB — PROTIME-INR: Prothrombin Time: 12.2 seconds (ref 11.6–15.2)

## 2012-12-28 LAB — GLUCOSE, CAPILLARY: Glucose-Capillary: 139 mg/dL — ABNORMAL HIGH (ref 70–99)

## 2012-12-28 LAB — POCT I-STAT TROPONIN I

## 2012-12-28 MED ORDER — METOPROLOL TARTRATE 1 MG/ML IV SOLN: 5.0000 mg | INTRAVENOUS | Status: DC | PRN

## 2012-12-28 MED ORDER — DILTIAZEM HCL 25 MG/5ML IV SOLN
15.0000 mg | Freq: Once | INTRAVENOUS | Status: AC
  Administered 2012-12-28: 15 mg via INTRAVENOUS

## 2012-12-28 MED ORDER — ALBUTEROL SULFATE HFA 108 (90 BASE) MCG/ACT IN AERS: 2.0000 | INHALATION_SPRAY | RESPIRATORY_TRACT | Status: DC | PRN

## 2012-12-28 MED ORDER — GADOBENATE DIMEGLUMINE 529 MG/ML IV SOLN
20.0000 mL | Freq: Once | INTRAVENOUS | Status: AC | PRN
  Administered 2012-12-28: 20 mL via INTRAVENOUS

## 2012-12-28 MED ORDER — DEXTROSE 5 % IV SOLN
5.0000 mg/h | INTRAVENOUS | Status: DC
  Administered 2012-12-28: 20 mg/h via INTRAVENOUS
  Administered 2012-12-28: 5 mg/h via INTRAVENOUS

## 2012-12-28 MED ORDER — DILTIAZEM LOAD VIA INFUSION
10.0000 mg | Freq: Once | INTRAVENOUS | Status: AC
  Administered 2012-12-28: 10 mg via INTRAVENOUS
  Filled 2012-12-28: qty 10

## 2012-12-28 MED ORDER — ASPIRIN 81 MG PO CHEW
81.0000 mg | CHEWABLE_TABLET | Freq: Every day | ORAL | Status: DC
  Filled 2012-12-28: qty 1

## 2012-12-28 MED ORDER — PANTOPRAZOLE SODIUM 40 MG PO TBEC
40.0000 mg | DELAYED_RELEASE_TABLET | Freq: Every day | ORAL | Status: DC
  Administered 2012-12-29 – 2013-01-01 (×4): 40 mg via ORAL
  Filled 2012-12-28 (×4): qty 1

## 2012-12-28 MED ORDER — METOPROLOL SUCCINATE ER 100 MG PO TB24
100.0000 mg | ORAL_TABLET | Freq: Two times a day (BID) | ORAL | Status: DC
  Administered 2012-12-28 – 2012-12-29 (×2): 100 mg via ORAL
  Filled 2012-12-28 (×3): qty 1

## 2012-12-28 MED ORDER — SODIUM CHLORIDE 0.9 % IV SOLN
INTRAVENOUS | Status: DC
  Administered 2012-12-28 – 2012-12-29 (×2): via INTRAVENOUS

## 2012-12-28 MED ORDER — RIVAROXABAN 20 MG PO TABS
20.0000 mg | ORAL_TABLET | Freq: Every day | ORAL | Status: DC
  Administered 2012-12-29: 20 mg via ORAL
  Filled 2012-12-28 (×3): qty 1

## 2012-12-28 NOTE — ED Notes (Signed)
Pt stated that she did not want to take the ordered Xarelto because she had a friend that had some bad complications from the medication.  MD made aware

## 2012-12-28 NOTE — Code Documentation (Signed)
Code stroke activated, Patient arrived to [facility] via private vehicle.  AS per husband LSN was 12/27/12 before going to bed.  Patient states she fell at some point in the afternoon, not real sure of the time and husband came home around 430p and found patient with aphasia and slurred speech.  NIHSS 2, improving in the ED.  Patient to be admitted for workup

## 2012-12-28 NOTE — Progress Notes (Signed)
ANTICOAGULATION CONSULT NOTE - Initial Consult  Pharmacy Consult for Xarelto Indication: atrial fibrillation  Allergies  Allergen Reactions  . Fish Allergy     Shell fish  . Penicillins   . Ace Inhibitors Cough    Patient Measurements: Height: 5\' 3"  (160 cm) Weight: 192 lb 3 oz (87.176 kg) IBW/kg (Calculated) : 52.4  Vital Signs: Temp: 98 F (36.7 C) (12/06 1713) Temp src: Oral (12/06 1713) BP: 142/100 mmHg (12/06 1902) Pulse Rate: 94 (12/06 1713)  Labs:  Recent Labs  12/28/12 1803 12/28/12 1805 12/28/12 1808  HGB 15.0  --  16.0*  HCT 43.4  --  47.0*  PLT 213  --   --   APTT 26  --   --   LABPROT 12.2  --   --   INR 0.92  --   --   CREATININE 0.78  --  1.00  TROPONINI  --  <0.30  --     Estimated Creatinine Clearance: 57.1 ml/min (by C-G formula based on Cr of 1).   Medical History: Past Medical History  Diagnosis Date  . Allergy   . Asthma   . Diabetes mellitus   . GERD (gastroesophageal reflux disease)   . Hyperlipidemia   . Hypertension   . Kidney stones   . Rheumatic fever   . TMJ arthritis   . Heart murmur   . Ectopic pregnancy   . Hx of rheumatic fever 08/07/2011    Assessment: Nina Carpenter w/ hx of afib, not on anticoagulation PTA, admitted with code stroke, didn't receive TPA, CT head showed no acute bleeding. Pt. Is admitted for stroke work up. Pharmacy is consulted to start xarelto for stroke prevention in the setting of afib. Hgb 15, plt 213. Scr 0.78, est. crcl 55-60 ml/min, LFTs wnl.  Goal of Therapy:  Appropriate xarelto dosing Monitor platelets by anticoagulation protocol: Yes   Plan:  - Xarelto 20 mg daily with evening meal - f/u renal function, cbc - f/u stroke workup - xarelto education with pharmacist.  Bayard Hugger, PharmD, BCPS  Clinical Pharmacist  Pager: [phone]   12/28/2012,7:22 PM

## 2012-12-28 NOTE — Consult Note (Signed)
Referring Physician: ED    Chief Complaint: CODE STROKE: DIFFICULTY SPEAKING.  HPI:                                                                                                                                         Nina Carpenter is an 67 y.o. female with a past medical history significant for HTN, DM, hyperlipidemia, atrial fibrillation, asthma, s/p gamma knife surgery for left trigeminal neuralgia, brought to [facility] ED by ambulance as a code stroke due to acute onset of the above stated symptoms. Although her husband said that she talked to her on the phone around 330 pm and she sound it normal, the last known well is not entirely clear as she was home alone the whole day and she said that she had a fall. Husband said that around 330 or 4 pm he noticed that his wife was having difficulty finding words out and was slurring her words. They report no focal weakness, face droopiness, numbness or tingling, unsteadiness, confusion, or visual disturbances.No chest pain, shortness or breath, or palpitations. Stated that she never had similar symptoms before. Upon arrival to ED had NIHSS 2. CT brain showed no acute abnormality but an area of encephalomalacia right parietal region of unclear etiology.  Her language started to improved in the ED.   Date last known well: 12/28/12 Time last known well: unknown.  tPA Given: no, unclear last known well NIHSS: 2 MRS: 0  Past Medical History  Diagnosis Date  . Allergy   . Asthma   . Diabetes mellitus   . GERD (gastroesophageal reflux disease)   . Hyperlipidemia   . Hypertension   . Kidney stones   . Rheumatic fever   . TMJ arthritis   . Heart murmur   . Ectopic pregnancy   . Hx of rheumatic fever 08/07/2011    Past Surgical History  Procedure Laterality Date  . Cholecystectomy    . Tubal ligation      ectopic preganacy   . Destruction trigeminal nerve via neurolytic agent  NOv @013     Gamma Knife     Family History  Problem Relation  Age of Onset  . Stroke Mother   . Diabetes Mother    Social History:  reports that she quit smoking about 44 years ago. Her smoking use included Cigarettes. She smoked 2.00 packs per day. She has never used smokeless tobacco. She reports that she drinks about 0.6 ounces of alcohol per week. She reports that she does not use illicit drugs.  Allergies:  Allergies  Allergen Reactions  . Fish Allergy     Shell fish  . Penicillins   . Ace Inhibitors Cough    Medications:  I have reviewed the patient's current medications.  ROS:                                                                                                                                       History obtained from the patient and husband.  General ROS: negative for - chills, fatigue, fever, night sweats, weight gain or weight loss Psychological ROS: negative for - behavioral disorder, hallucinations, memory difficulties, mood swings or suicidal ideation Ophthalmic ROS: negative for - blurry vision, double vision, eye pain or loss of vision ENT ROS: negative for - epistaxis, nasal discharge, oral lesions, sore throat, tinnitus or vertigo Allergy and Immunology ROS: negative for - hives or itchy/watery eyes Hematological and Lymphatic ROS: negative for - bleeding problems, bruising or swollen lymph nodes Endocrine ROS: negative for - galactorrhea, hair pattern changes, polydipsia/polyuria or temperature intolerance Respiratory ROS: negative for - cough, hemoptysis, shortness of breath or wheezing Cardiovascular ROS: negative for - chest pain, dyspnea on exertion, edema or irregular heartbeat Gastrointestinal ROS: negative for - abdominal pain, diarrhea, hematemesis, nausea/vomiting or stool incontinence Genito-Urinary ROS: negative for - dysuria, hematuria, incontinence or urinary  frequency/urgency Musculoskeletal ROS: negative for - joint swelling or muscular weakness Neurological ROS: as noted in HPI Dermatological ROS: negative for rash and skin lesion changes  Physical exam: pleasant female in no apparent distress. Blood pressure 156/86, pulse 94, temperature 98 F (36.7 C), temperature source Oral, resp. rate 20, height 5\' 3"  (1.6 m), weight 87.176 kg (192 lb 3 oz), SpO2 100.00%. Head: normocephalic. Neck: supple, no bruits, no JVD. Cardiac: no murmurs. Lungs: clear. Abdomen: soft, no tender, no mass. Extremities: no edema.   Neurologic Examination:                                                                                                      Mental Status: Alert, awake, oriented x 4, thought content appropriate.  No dysarthria but mild dysphasia characterized by difficulty finding words out.  Able to follow 3 step commands without difficulty. Cranial Nerves: II: Discs flat bilaterally; Visual fields grossly normal, pupils equal, round, reactive to light and accommodation III,IV, VI: ptosis not present, extra-ocular motions intact bilaterally V,VII: smile symmetric, facial light touch sensation normal bilaterally VIII: hearing normal bilaterally IX,X: gag reflex present XI: bilateral shoulder shrug XII: midline tongue extension without atrophy or fasciculations  Motor: Right : Upper extremity   5/5    Left:     Upper extremity   5/5  Lower extremity   5/5     Lower extremity   5/5 Tone and bulk:normal tone throughout; no atrophy noted Sensory: Pinprick and light touch intact throughout, bilaterally Deep Tendon Reflexes:  Right: Upper Extremity   Left: Upper extremity   biceps (C-5 to C-6) 2/4   biceps (C-5 to C-6) 2/4 tricep (C7) 2/4    triceps (C7) 2/4 Brachioradialis (C6) 2/4  Brachioradialis (C6) 2/4  Lower Extremity Lower Extremity  quadriceps (L-2 to L-4) 2/4   quadriceps (L-2 to L-4) 2/4 Achilles (S1) 2/4   Achilles (S1)  2/4  Plantars: Right: downgoing   Left: downgoing Cerebellar: normal finger-to-nose,  normal heel-to-shin test Gait:  No ataxia. CV: pulses palpable throughout    Results for orders placed during the hospital encounter of 12/28/12 (from the past 48 hour(s))  PROTIME-INR     Status: None   Collection Time    12/28/12  6:03 PM      Result Value Range   Prothrombin Time 12.2  11.6 - 15.2 seconds   INR 0.92  0.00 - 1.49  APTT     Status: None   Collection Time    12/28/12  6:03 PM      Result Value Range   aPTT 26  24 - 37 seconds  CBC     Status: None   Collection Time    12/28/12  6:03 PM      Result Value Range   WBC 7.5  4.0 - 10.5 K/uL   RBC 4.82  3.87 - 5.11 MIL/uL   Hemoglobin 15.0  12.0 - 15.0 g/dL   HCT 82.9  56.2 - 13.0 %   MCV 90.0  78.0 - 100.0 fL   MCH 31.1  26.0 - 34.0 pg   MCHC 34.6  30.0 - 36.0 g/dL   RDW 86.5  78.4 - 69.6 %   Platelets 213  150 - 400 K/uL  DIFFERENTIAL     Status: None   Collection Time    12/28/12  6:03 PM      Result Value Range   Neutrophils Relative % 68  43 - 77 %   Neutro Abs 5.1  1.7 - 7.7 K/uL   Lymphocytes Relative 25  12 - 46 %   Lymphs Abs 1.9  0.7 - 4.0 K/uL   Monocytes Relative 5  3 - 12 %   Monocytes Absolute 0.4  0.1 - 1.0 K/uL   Eosinophils Relative 1  0 - 5 %   Eosinophils Absolute 0.1  0.0 - 0.7 K/uL   Basophils Relative 0  0 - 1 %   Basophils Absolute 0.0  0.0 - 0.1 K/uL  POCT I-STAT TROPONIN I     Status: None   Collection Time    12/28/12  6:06 PM      Result Value Range   Troponin i, poc 0.00  0.00 - 0.08 ng/mL   Comment 3            Comment: Due to the release kinetics of cTnI,     a negative result within the first hours     of the onset of symptoms does not rule out     myocardial infarction with certainty.     If myocardial infarction is still suspected,     repeat the test at appropriate intervals.  POCT I-STAT, CHEM 8     Status: Abnormal   Collection Time    12/28/12  6:08 PM  Result Value  Range   Sodium 141  135 - 145 mEq/L   Potassium 3.1 (*) 3.5 - 5.1 mEq/L   Chloride 106  96 - 112 mEq/L   BUN 17  6 - 23 mg/dL   Creatinine, Ser 1.61  0.50 - 1.10 mg/dL   Glucose, Bld 096 (*) 70 - 99 mg/dL   Calcium, Ion 0.45  4.09 - 1.30 mmol/L   TCO2 22  0 - 100 mmol/L   Hemoglobin 16.0 (*) 12.0 - 15.0 g/dL   HCT 81.1 (*) 91.4 - 78.2 %   No results found.   Assessment: 67 y.o. female with multiple risk factors for stroke brought in with mild dysphasia that is already improving. Initial NIHSS 2 (dysphasia and mild right face weakness that is not present anymore), but current NIHSS 1. Suspect small left cortical infarct, most likely embolism in the setting of atrial fibrillation. Last known well is not entirely clear and NIHSS 1, thus will not offer IV tpa or enrollment in PRISM  Trial. Will start xarelto, admit to medicine and complete stroke work up.  Stroke Risk Factors - HTN, DM, hyperlipidemia, atrial fibrillation.  Plan: 1. HgbA1c, fasting lipid panel 2. MRI, MRA  of the brain without contrast 3. Echocardiogram 4. Carotid dopplers 5. Prophylactic therapy-xarelto 6. Risk factor modification 7. Telemetry monitoring 8. Frequent neuro checks 9. PT/OT SLP  Wyatt Portela, MD [facility] [phone]  12/28/2012, 6:35 PM

## 2012-12-28 NOTE — ED Provider Notes (Addendum)
CSN: [id number]     Arrival date & time 12/28/12  1703 History   First MD Initiated Contact with Patient 12/28/12 1729     Chief Complaint  Patient presents with  . Fall  . Code Stroke    HPI Pt's husband found her home this afternoon and she appeared confused.  She was sitting watching the TV and was fiddling with the remote.  She kept on saying her husbands name and she was having trouble with finding certain words in her speech.  She is better now.  Last time that she was known to be completely normal for sure was last evening.  Pt went to work this morning before pt was up.  He did speak to her on the telephone at 1430 and she seemed fine then.  Husband noticed the symptoms at 160 when he came home.  Past Medical History  Diagnosis Date  . Allergy   . Asthma   . Diabetes mellitus   . GERD (gastroesophageal reflux disease)   . Hyperlipidemia   . Hypertension   . Kidney stones   . Rheumatic fever   . TMJ arthritis   . Heart murmur   . Ectopic pregnancy   . Hx of rheumatic fever 08/07/2011   Past Surgical History  Procedure Laterality Date  . Cholecystectomy    . Tubal ligation      ectopic preganacy   . Destruction trigeminal nerve via neurolytic agent  NOv @013     Gamma Knife    Family History  Problem Relation Age of Onset  . Stroke Mother   . Diabetes Mother    History  Substance Use Topics  . Smoking status: Former Smoker -- 2.00 packs/day    Types: Cigarettes    Quit date: 01/24/1968  . Smokeless tobacco: Never Used  . Alcohol Use: 0.6 oz/week    1 Glasses of wine per week   OB History   Grav Para Term Preterm Abortions TAB SAB Ect Mult Living                 Review of Systems  All other systems reviewed and are negative.    Allergies  Fish allergy; Penicillins; and Ace inhibitors  Home Medications   Current Outpatient Rx  Name  Route  Sig  Dispense  Refill  . albuterol (VENTOLIN HFA) 108 (90 BASE) MCG/ACT inhaler      inhale 2 puffs by mouth  every 4 hours if needed for wheezing or shortness of breath   8.5 g   1   . aspirin 81 MG tablet   Oral   Take 81 mg by mouth daily.         Marland Kitchen diltiazem (CARDIZEM CD) 120 MG 24 hr capsule   Oral   Take 1 capsule (120 mg total) by mouth daily.   90 capsule   3   . famotidine (PEPCID) 10 MG tablet   Oral   Take 10 mg by mouth 2 (two) times daily as needed.         Marland Kitchen losartan (COZAAR) 100 MG tablet      take 1 tablet by mouth once daily   30 tablet   5   . metoprolol succinate (TOPROL-XL) 100 MG 24 hr tablet   Oral   Take 100 mg by mouth 2 (two) times daily. Take with or immediately following a meal.  100 mg every morning. 50 mg at night         .  glucose blood (ONE TOUCH ULTRA TEST) test strip      Pt checks blood sugar twice a day and as needed.250.00   100 each   11   . ONETOUCH DELICA LANCETS MISC      Checking blood sugar twice a day and as needed. 250.00   100 each   11    BP 156/86  Pulse 94  Temp(Src) 98 F (36.7 C) (Oral)  Resp 20  Ht 5\' 3"  (1.6 m)  Wt 192 lb 3 oz (87.176 kg)  BMI 34.05 kg/m2  SpO2 100% Physical Exam  Nursing note and vitals reviewed. Constitutional: She is oriented to person, place, and time. She appears well-developed and well-nourished. No distress.  HENT:  Head: Normocephalic and atraumatic.  Right Ear: External ear normal.  Left Ear: External ear normal.  Mouth/Throat: Oropharynx is clear and moist.  Eyes: Conjunctivae are normal. Right eye exhibits no discharge. Left eye exhibits no discharge. No scleral icterus.  Neck: Neck supple. No tracheal deviation present.  Cardiovascular: Intact distal pulses.  An irregularly irregular rhythm present. Tachycardia present.   Pulmonary/Chest: Effort normal and breath sounds normal. No stridor. No respiratory distress. She has no wheezes. She has no rales.  Abdominal: Soft. Bowel sounds are normal. She exhibits no distension. There is no tenderness. There is no rebound and no  guarding.  Musculoskeletal: She exhibits no edema and no tenderness.  Neurological: She is alert and oriented to person, place, and time. She has normal strength. No cranial nerve deficit (No facial droop, extraocular movements intact, tongue deviates to the left, slight aphasia) or sensory deficit. She exhibits normal muscle tone. She displays no seizure activity. Coordination normal.  Slight pronator drift right upper extremity, able to hold both legs off bed for 5 seconds, sensation intact in all extremities, no visual field cuts, no left or right sided neglect, normal finger-nose exam bilaterally, no nystagmus noted   Skin: Skin is warm and dry. No rash noted.  Psychiatric: She has a normal mood and affect.    ED Course  Procedures (including critical care time)  Medications  diltiazem (CARDIZEM) 1 mg/mL load via infusion 10 mg (0 mg Intravenous Stopped 12/28/12 1850)    And  diltiazem (CARDIZEM) 100 mg in dextrose 5 % 100 mL infusion (5 mg/hr Intravenous New Bag/Given 12/28/12 1830)   1935  BP 157/81.  Still tachycardic.  Will give additional 15 mg bolus.  Continue infusion CRITICAL CARE Performed by: Linwood Dibbles R Total critical care time: 30 Critical care time was exclusive of separately billable procedures and treating other patients. Critical care was necessary to treat or prevent imminent or life-threatening deterioration. Critical care was time spent personally by me on the following activities: development of treatment plan with patient and/or surrogate as well as nursing, discussions with consultants, evaluation of patient's response to treatment, examination of patient, obtaining history from patient or surrogate, ordering and performing treatments and interventions, ordering and review of laboratory studies, ordering and review of radiographic studies, pulse oximetry and re-evaluation of patient's condition.   Labs Review Labs Reviewed  URINALYSIS, ROUTINE W REFLEX MICROSCOPIC  - Abnormal; Notable for the following:    Specific Gravity, Urine 1.004 (*)    All other components within normal limits  GLUCOSE, CAPILLARY - Abnormal; Notable for the following:    Glucose-Capillary 139 (*)    All other components within normal limits  POCT I-STAT, CHEM 8 - Abnormal; Notable for the following:    Potassium 3.1 (*)  Glucose, Bld 149 (*)    Hemoglobin 16.0 (*)    HCT 47.0 (*)    All other components within normal limits  PROTIME-INR  APTT  CBC  DIFFERENTIAL  TROPONIN I  ETHANOL  COMPREHENSIVE METABOLIC PANEL  URINE RAPID DRUG SCREEN (HOSP PERFORMED)  LIPID PANEL  HEMOGLOBIN A1C  POCT I-STAT TROPONIN I   Imaging Review Ct Head Wo Contrast  12/28/2012   CLINICAL DATA:  Code stroke. History of gamma knife surgery for trigeminal neuralgia.  EXAM: CT HEAD WITHOUT CONTRAST  TECHNIQUE: Contiguous axial images were obtained from the base of the skull through the vertex without contrast.  COMPARISON:  None  FINDINGS: There is focal encephalomalacia in the right temporal-parietal lobe with mild extension into the adjacent white matter. Small focus of low density in the left frontal-parietal subcortical white matter on sequence 2, image 19. There is additional subcortical white matter disease in the right frontal lobe. No evidence for acute hemorrhage, mass lesion, midline shift or hydrocephalus. No evidence for a new large acute or subacute infarct. No acute bone abnormality  IMPRESSION: Negative for acute hemorrhage.  Encephalomalacia involving the right parietal and right temporal cortex. Findings are consistent with a previous insult or infarct.  Scattered areas of white matter disease could represent chronic small vessel ischemic changes.   Electronically Signed   By: Richarda Overlie M.D.   On: 12/28/2012 18:48    EKG Interpretation    Date/Time:  Saturday December 28 2012 17:48:49 EST Ventricular Rate:  150 PR Interval:    QRS Duration: 79 QT Interval:  249 QTC  Calculation: 393 R Axis:   51 Text Interpretation:  Atrial fibrillation Low voltage, precordial leads Repol abnrm suggests ischemia, diffuse leads Since last tracing Confirmed by Taji Barretto  MD-J, Vedant Shehadeh ([id number]) on 12/28/2012 6:54:30 PM            MDM   1. Atrial fibrillation with rapid ventricular response   2. Stroke    Code stroke was called based on the patient's symptoms. There was some uncertainty in the time that she was last known to be normal. Her husband did speak to her on the phone at 2 PM he thought she was fine at that time. Neurology was contacted. Dr. Leroy Kennedy evaluated the patient. He did not feel that she was a TPA candidate based on her concerning CT findings as well as the mild nature of her symptoms.  The patient does have a history of atrial fibrillation. She was found to be in rapid A. fib here in the emergency department. The Cardizem drip was initiated  Patient was monitored closely. She'll be admitted to the hospital for further treatment    Celene Kras, MD 12/28/12 1901  Celene Kras, MD 12/28/12 [id number]

## 2012-12-28 NOTE — ED Notes (Signed)
Pt taken to MRI on monitor with Primary RN

## 2012-12-28 NOTE — H&P (Addendum)
[facility] History and Physical  Patient: Nina Carpenter  ZOX:[id number]  DOB: [birth year]  DOS: the patient was seen and examined on 12/28/2012 PCP: [name], MD  Chief Complaint: Dysarthria and expressive aphasia  HPI: Nina Carpenter is a 67 y.o. female with Past medical history of atrial fibrillation, diabetes, hypertension, GERD. The patient is coming from home. The patient presents with an episode of fall. She mentions that today in the afternoon when she was at home after shower she went to cause her and then she had a fall at around the closet she did not hit her head or neck does not have any external or bruise. After that at around 3:30 when the husband came home he found that she was not coherent in her speech and had slurring of her worse as well. The patient on my evaluation denied any complaint of focal neurological weakness or numbness, dizziness, blurring of the vision, chest pain or palpitation, shortness of breath, fever, chills. She denied any episodes of vomiting or diarrhea. Mentions that a few days ago she had an episode of stomach bug. She has history of atrial fibrillation and has been on aspirin. She was recommended anticoagulation in the past which she denied. As per the husband while in the ED she had episodes of clear speech and thought interspersed with confusion and slurred speech.  Review of Systems: as mentioned in the history of present illness.  A Comprehensive review of the other systems is negative.  Past Medical History  Diagnosis Date  . Allergy   . Asthma   . Diabetes mellitus   . GERD (gastroesophageal reflux disease)   . Hyperlipidemia   . Hypertension   . Kidney stones   . Rheumatic fever   . TMJ arthritis   . Heart murmur   . Ectopic pregnancy   . Hx of rheumatic fever 08/07/2011   Past Surgical History  Procedure Laterality Date  . Cholecystectomy    . Tubal ligation      ectopic preganacy   . Destruction trigeminal  nerve via neurolytic agent  NOv @013     Gamma Knife    Social History:  reports that she quit smoking about 44 years ago. Her smoking use included Cigarettes. She smoked 2.00 packs per day. She has never used smokeless tobacco. She reports that she drinks about 0.6 ounces of alcohol per week. She reports that she does not use illicit drugs. Independent for most of her  ADL.  Allergies  Allergen Reactions  . Fish Allergy     Shell fish  . Penicillins   . Ace Inhibitors Cough    Family History  Problem Relation Age of Onset  . Stroke Mother   . Diabetes Mother     Prior to Admission medications   Medication Sig Start Date End Date Taking? Authorizing Provider  albuterol (VENTOLIN HFA) 108 (90 BASE) MCG/ACT inhaler inhale 2 puffs by mouth every 4 hours if needed for wheezing or shortness of breath 10/29/12  Yes Amy E Bedsole, MD  aspirin 81 MG tablet Take 81 mg by mouth daily.   Yes Historical Provider, MD  diltiazem (CARDIZEM CD) 120 MG 24 hr capsule Take 1 capsule (120 mg total) by mouth daily. 10/14/12  Yes Roger A Arguello, PA-C  famotidine (PEPCID) 10 MG tablet Take 10 mg by mouth 2 (two) times daily as needed.   Yes Historical Provider, MD  losartan (COZAAR) 100 MG tablet take 1 tablet by mouth once daily 10/07/12  Yes [name], MD  metoprolol succinate (TOPROL-XL) 100 MG 24 hr tablet Take 100 mg by mouth 2 (two) times daily. Take with or immediately following a meal.  100 mg every morning. 50 mg at night 12/05/12  Yes Antonieta Iba, MD  glucose blood (ONE TOUCH ULTRA TEST) test strip Pt checks blood sugar twice a day and as needed.250.00 09/07/11   [name], MD  [facility] DELICA LANCETS MISC Checking blood sugar twice a day and as needed. 250.00 09/07/11   [name], MD    Physical Exam: Filed Vitals:   12/28/12 1930 12/28/12 1945 12/28/12 2000 12/28/12 2015  BP: 157/81 149/89 149/97 173/104  Pulse: 118 126 96 161  Temp:      TempSrc:      Resp: 25 17 24 20    Height:      Weight:      SpO2: 99% 97% 96% 98%    General: Alert, Awake and Oriented to Time, Place and Person. Appear in mild distress Eyes: PERRL ENT: Oral Mucosa clear moist. Neck: no JVD Cardiovascular: S1 and S2 Present, no Murmur, Peripheral Pulses Present Respiratory: Bilateral Air entry equal and Decreased, Clear to Auscultation,  no Crackles,no wheezes Abdomen: Bowel Sound Present, Soft and Non tender Skin: no Rash Extremities: no Pedal edema, no calf tenderness Neurologic: Mental status alert awake appears to have expressive aphasia as well as dysarthria, Cranial Nerves pupils are equal and reactive, Motor strength bilaterally equal strength, Sensation light touch sensation intact, reflexes present bilaterally, babinski negative, Proprioception normal, Cerebellar test normal finger-nose-finger.  Labs on Admission:  CBC:  Recent Labs Lab 12/28/12 1803 12/28/12 1808  WBC 7.5  --   NEUTROABS 5.1  --   HGB 15.0 16.0*  HCT 43.4 47.0*  MCV 90.0  --   PLT 213  --     CMP     Component Value Date/Time   NA 141 12/28/2012 1808   K 3.1* 12/28/2012 1808   CL 106 12/28/2012 1808   CO2 20 12/28/2012 1803   GLUCOSE 149* 12/28/2012 1808   BUN 17 12/28/2012 1808   CREATININE 1.00 12/28/2012 1808   CALCIUM 9.2 12/28/2012 1803   PROT 7.5 12/28/2012 1803   ALBUMIN 4.4 12/28/2012 1803   AST 20 12/28/2012 1803   ALT 22 12/28/2012 1803   ALKPHOS 98 12/28/2012 1803   BILITOT 0.5 12/28/2012 1803   GFRNONAA 85* 12/28/2012 1803   GFRAA >90 12/28/2012 1803    No results found for this basename: LIPASE, AMYLASE,  in the last 168 hours No results found for this basename: AMMONIA,  in the last 168 hours   Recent Labs Lab 12/28/12 1805  TROPONINI <0.30   BNP (last 3 results)  Recent Labs  10/15/12 1155  PROBNP 273.0*    Radiological Exams on Admission: Ct Head Wo Contrast  12/28/2012   CLINICAL DATA:  Code stroke. History of gamma knife surgery for trigeminal neuralgia.  EXAM:  CT HEAD WITHOUT CONTRAST  TECHNIQUE: Contiguous axial images were obtained from the base of the skull through the vertex without contrast.  COMPARISON:  None  FINDINGS: There is focal encephalomalacia in the right temporal-parietal lobe with mild extension into the adjacent white matter. Small focus of low density in the left frontal-parietal subcortical white matter on sequence 2, image 19. There is additional subcortical white matter disease in the right frontal lobe. No evidence for acute hemorrhage, mass lesion, midline shift or hydrocephalus. No evidence for a new large acute or  subacute infarct. No acute bone abnormality  IMPRESSION: Negative for acute hemorrhage.  Encephalomalacia involving the right parietal and right temporal cortex. Findings are consistent with a previous insult or infarct.  Scattered areas of white matter disease could represent chronic small vessel ischemic changes.   Electronically Signed   By: Richarda Overlie M.D.   On: 12/28/2012 18:48    EKG: Independently reviewed. atrial fibrillation, with RVR.  Assessment/Plan Principal Problem:   CVA (cerebral infarction) Active Problems:   HYPERLIPIDEMIA   HYPERTENSION   Stroke   Atrial fibrillation   1. CVA (cerebral infarction) The patient presents with complaints of dysarthria and her initial CT scan and EKG does not show any acute abnormality her troponin and other laboratory findings are also not significant. She is found to have atrial fibrillation with rapid ventricular response, she has history of A. fib in the past and is not on any anticoagulation. As she likely to have a thromboembolic type of stroke. Since her symptoms have been started at around 3:30 and her NIHSS score was 1 she was not felt to be a candidate for TPA on arrival. Neurologist saw the patient and recommended her to be on Xarelto and further stroke workup. telemetry, neuro checks will also be done.  2. Atrial fibrillation with RVR The patient is placed  on IV Cardizem drip,  Since she is not responding to Cardizem drip at maximum dose, she should be continued on Lopressor, and she will also given as needed Lopressor for further tachycardia. She'll be monitored in the step down unit.  3. Hypertension At present holding the Cozaar to continue Cardizem metoprolol for rate control   Consults:  neurology  DVT Prophylaxis: mechanical compression device Nutrition:  cardiac diet  Code Status:  full  Family Communication:  husband was present at bedside, opportunity was given to ask question and all questions were answered satisfactorily at the time of interview. Disposition: Admitted to inpatient in step-down unit.  Author: Lynden Oxford, MD [facility] Pager: [phone] 12/28/2012, 10:24 PM    If 7PM-7AM, please contact night-coverage [email or web address] Password [email or web address]

## 2012-12-28 NOTE — ED Notes (Signed)
Pt reports she had an unwitnesses fall this afternoon. Reports she tripped over a cord in her closet. States that she did not hit her head. Husband reports that her speech was abnormal at home, but now sounds better. Pt denies any pain. No neuro deficits noted.

## 2012-12-29 ENCOUNTER — Encounter (HOSPITAL_COMMUNITY): Payer: Self-pay | Admitting: Physician Assistant

## 2012-12-29 DIAGNOSIS — I495 Sick sinus syndrome: Secondary | ICD-10-CM

## 2012-12-29 DIAGNOSIS — I4891 Unspecified atrial fibrillation: Secondary | ICD-10-CM | POA: Diagnosis present

## 2012-12-29 DIAGNOSIS — E785 Hyperlipidemia, unspecified: Secondary | ICD-10-CM

## 2012-12-29 DIAGNOSIS — I10 Essential (primary) hypertension: Secondary | ICD-10-CM

## 2012-12-29 DIAGNOSIS — I6350 Cerebral infarction due to unspecified occlusion or stenosis of unspecified cerebral artery: Secondary | ICD-10-CM

## 2012-12-29 LAB — COMPREHENSIVE METABOLIC PANEL
ALT: 19 U/L (ref 0–35)
AST: 18 U/L (ref 0–37)
Alkaline Phosphatase: 80 U/L (ref 39–117)
BUN: 11 mg/dL (ref 6–23)
CO2: 23 mEq/L (ref 19–32)
Calcium: 8.7 mg/dL (ref 8.4–10.5)
Chloride: 104 mEq/L (ref 96–112)
Creatinine, Ser: 0.72 mg/dL (ref 0.50–1.10)
GFR calc Af Amer: >90 mL/min (ref >90)
GFR calc non Af Amer: 87 mL/min — ABNORMAL LOW (ref >90)
Glucose, Bld: 175 mg/dL — ABNORMAL HIGH (ref 70–99)
Sodium: 140 mEq/L (ref 135–145)
Total Bilirubin: 0.7 mg/dL (ref 0.3–1.2)
Total Protein: 6.5 g/dL (ref 6.0–8.3)

## 2012-12-29 LAB — CBC WITH DIFFERENTIAL/PLATELET
Basophils Absolute: 0 10*3/uL (ref 0.0–0.1)
Eosinophils Relative: 0 % (ref 0–5)
HCT: 40.2 % (ref 36.0–46.0)
Lymphocytes Relative: 18 % (ref 12–46)
Lymphs Abs: 1.5 10*3/uL (ref 0.7–4.0)
MCH: 31 pg (ref 26.0–34.0)
MCV: 89.7 fL (ref 78.0–100.0)
Monocytes Absolute: 0.6 10*3/uL (ref 0.1–1.0)
Neutro Abs: 6.6 10*3/uL (ref 1.7–7.7)
RBC: 4.48 MIL/uL (ref 3.87–5.11)
RDW: 12.7 % (ref 11.5–15.5)
WBC: 8.8 10*3/uL (ref 4.0–10.5)

## 2012-12-29 LAB — LIPID PANEL
HDL: 50 mg/dL (ref >39)
LDL Cholesterol: 154 mg/dL — ABNORMAL HIGH (ref 0–99)
VLDL: 23 mg/dL (ref 0–40)

## 2012-12-29 LAB — HEMOGLOBIN A1C
Hgb A1c MFr Bld: 7.8 % — ABNORMAL HIGH (ref <5.7)
Hgb A1c MFr Bld: 7.9 % — ABNORMAL HIGH (ref <5.7)
Mean Plasma Glucose: 177 mg/dL — ABNORMAL HIGH (ref <117)

## 2012-12-29 LAB — GLUCOSE, CAPILLARY: Glucose-Capillary: 161 mg/dL — ABNORMAL HIGH (ref 70–99)

## 2012-12-29 LAB — TROPONIN I: Troponin I: <0.3 ng/mL (ref <0.30)

## 2012-12-29 MED ORDER — METOPROLOL TARTRATE 50 MG PO TABS
50.0000 mg | ORAL_TABLET | Freq: Four times a day (QID) | ORAL | Status: DC
  Administered 2012-12-29 – 2012-12-30 (×5): 50 mg via ORAL
  Filled 2012-12-29 (×5): qty 1
  Filled 2012-12-29: qty 2
  Filled 2012-12-29 (×2): qty 1

## 2012-12-29 MED ORDER — ASPIRIN EC 325 MG PO TBEC
325.0000 mg | DELAYED_RELEASE_TABLET | Freq: Every day | ORAL | Status: DC
  Administered 2012-12-29 – 2013-01-01 (×4): 325 mg via ORAL
  Filled 2012-12-29 (×4): qty 1

## 2012-12-29 MED ORDER — ENOXAPARIN SODIUM 40 MG/0.4ML ~~LOC~~ SOLN
40.0000 mg | SUBCUTANEOUS | Status: DC
  Administered 2012-12-29 – 2012-12-31 (×2): 40 mg via SUBCUTANEOUS
  Filled 2012-12-29 (×4): qty 0.4

## 2012-12-29 MED ORDER — HYDRALAZINE HCL 20 MG/ML IJ SOLN
5.0000 mg | Freq: Four times a day (QID) | INTRAMUSCULAR | Status: DC | PRN
  Administered 2012-12-30 – 2012-12-31 (×3): 5 mg via INTRAVENOUS
  Filled 2012-12-29 (×3): qty 1

## 2012-12-29 NOTE — Progress Notes (Signed)
Telemetry revealed 2 second pause followed by 4 second pause.  Patient sleeping.  BP = 119/53.  Diltiazem drip titrated down to 5 mg/hr.  Patient remains in atrial fibrillation rhythm, but VR is now less than 100. VR ranges between 70-80's. Will continue to monitor closely.

## 2012-12-29 NOTE — Evaluation (Signed)
Physical Therapy Evaluation Patient Details Name: Nina Carpenter MRN: [id number] DOB: [birth year] Today's Date: 12/29/2012 Time: 1349-1401 PT Time Calculation (min): 12 min  PT Assessment / Plan / Recommendation History of Present Illness  Nina Carpenter is a 67 y.o. female w/ PMHx s/f PAF (refused anticoagulation earlier this year), DM2, HTN, HLD, obesity and GERD/hiatal hernia who was admitted to [facility] for CVA.  Clinical Impression  Patient is independent with mobility and gait.  Good balance with gait.  No acute PT needs identified - PT will sign off.    PT Assessment  Patent does not need any further PT services    Follow Up Recommendations  No PT follow up;Supervision - Intermittent    Does the patient have the potential to tolerate intense rehabilitation      Barriers to Discharge        Equipment Recommendations  None recommended by PT    Recommendations for Other Services     Frequency      Precautions / Restrictions Precautions Precautions: None Restrictions Weight Bearing Restrictions: No   Pertinent Vitals/Pain       Mobility  Bed Mobility Bed Mobility: Supine to Sit;Sitting - Scoot to Edge of Bed Supine to Sit: 7: Independent Sitting - Scoot to [place] of Bed: 7: Independent Transfers Transfers: Sit to Stand;Stand to Sit Sit to Stand: 7: Independent Stand to Sit: 7: Independent Ambulation/Gait Ambulation/Gait Assistance: 7: Independent Ambulation Distance (Feet): 180 Feet Assistive device: None Ambulation/Gait Assistance Details: No assist or cues needed.  Good gait pattern, balance, and speed Gait Pattern: Within Functional Limits Gait velocity: WFL Modified Rankin (Stroke Patients Only) Pre-Morbid Rankin Score: No symptoms Modified Rankin: No significant disability        PT Goals(Current goals can be found in the care plan section)    Visit Information  Last PT Received On: 12/29/12 Assistance Needed: +1 History of  Present Illness: Nina Carpenter is a 67 y.o. female w/ PMHx s/f PAF (refused anticoagulation earlier this year), DM2, HTN, HLD, obesity and GERD/hiatal hernia who was admitted to [facility] for CVA.       Prior Functioning  Home Living Family/patient expects to be discharged to:: Private residence Living Arrangements: Spouse/significant other Available Help at Discharge: Family;Available 24 hours/day Type of Home: House Home Access: Stairs to enter [employer] of Steps: 5 Entrance Stairs-Rails: Right;Left Home Layout: Two level;Bed/bath upstairs Alternate Level Stairs-Number of Steps: flight Alternate Level Stairs-Rails: Right Home Equipment: None Prior Function Level of Independence: Independent Comments: Patient works as Electronics engineer: Expressive difficulties    Copywriter, advertising Arousal/Alertness: Awake/alert Behavior During Therapy: WFL for tasks assessed/performed Overall Cognitive Status: Within Functional Limits for tasks assessed    Extremity/Trunk Assessment Upper Extremity Assessment Upper Extremity Assessment: Overall WFL for tasks assessed Lower Extremity Assessment Lower Extremity Assessment: Overall WFL for tasks assessed Cervical / Trunk Assessment Cervical / Trunk Assessment: Normal   Balance Balance Balance Assessed: Yes Static Standing Balance Single Leg Stance - Right Leg: 8 Single Leg Stance - Left Leg: 6 Rhomberg - Eyes Opened: 30 Rh[place]berg - Eyes Closed: 30 High Level Balance High Level Balance Activites: Turns;Sudden stops;Head turns High Level Balance Comments: No loss of balance with high level balance activities.  End of Session PT - End of Session Activity Tolerance: Patient tolerated treatment well Patient left: in bed;with call bell/phone within reach;with family/visitor present (sitting EOB) Nurse Communication: Mobility status (Encouraged ambulation in hallway with assist)  GP  Vena Austria 12/29/2012, 4:58 PM Durenda Hurt. Renaldo Fiddler, [facility] Acute Rehab Services Pager [phone]

## 2012-12-29 NOTE — Progress Notes (Signed)
SLP Cancellation Note  Patient Details Name: SANYE LEDESMA MRN: [id number] DOB: 07-08-1945   Cancelled treatment:  Received order for SLE.  Evaluation to be completed on 12/30/12. Moreen Fowler MS, CCC-SLP [phone] [facility] 12/29/2012, 3:22 PM

## 2012-12-29 NOTE — Progress Notes (Signed)
  Echocardiogram 2D Echocardiogram has been performed.  Georgian Co 12/29/2012, 5:32 PM

## 2012-12-29 NOTE — Progress Notes (Signed)
*  PRELIMINARY RESULTS* Vascular Ultrasound Carotid Duplex (Doppler) has been completed.  Preliminary findings: Bilateral:  1-39% ICA stenosis.  Vertebral artery flow is antegrade.   Difficult to visualize distal ICA bilaterally, but no evidence of significant stenosis or occlusion is noted.    Farrel Demark, RDMS, RVT  12/29/2012, 9:45 AM

## 2012-12-29 NOTE — Progress Notes (Signed)
HR frequently dropping to 30-40's (nonsustained junctional rhythms).  Cardizem drip titrated off @ 0228.  Remains primarily in atrial fib with VR 80-90's. Dr Allena Katz paged and updated.

## 2012-12-29 NOTE — Progress Notes (Signed)
[facility] PROGRESS NOTE  CLOVIA REINE  ZOX:[id number]  DOB: [birth year]  DOS: 12/29/2012  PCP: [name], MD  Subjective:  Assessment and Plan:  Called as the pt had 2 pauses on telemetry, Telemetry rhythm reviewed. Pt still has A fib but not rate controlled. Last pause was 1 hour ago nearly 2 sec, BP stable and pt asymptomatic. Continue to monitor the pt at present.  Stopped cardizem and lopressor.   Author: Lynden Oxford, MD [facility] Pager: [phone] 12/29/2012 3:34 AM   If 7PM-7AM, please contact night-coverage at [email or web address], password [facility]

## 2012-12-29 NOTE — Consult Note (Signed)
Cardiology Consult Note   Patient ID: NATILIE KRABBENHOFT MRN: [id number], DOB/AGE: [birth year]   Admit date: 12/28/2012 Date of Consult: 12/29/2012  Primary Physician: [name], MD Primary Cardiologist: Concha Se, MD  Reason for consult: atrial fibrillation with RVR, bradycardia with pauses, CVA  HPI: Nina Carpenter is a 67 y.o. female w/ PMHx s/f PAF (refused anticoagulation earlier this year), DM2, HTN, HLD, obesity and GERD/hiatal hernia who was admitted to [facility] for CVA.   She was initially seen by Dr. Mariah Milling in 07/2011 for HTN, tachycardia, palpitations in the setting of indigestion. NSR at that time. Propanolol was added PRN for palpitations. There was a recommendation of Holter monitoring should palpitations continue.   I saw her in follow-up 09/2012 after c/o LE edema. She was found to be in a new onset a-fib with RVR (HR 126) and resultant, suspected diastolic CHF. She was started on Cardizem 120 in addition to her Toprol-XL. Xarelto was prescribed for anticoagulation. Pro BNP was mildly elevated. TSH, free T4, electrolytes WNL. 2D echo showed EF 60%, no mention of diastolic dysfunction, mild LAE. She followed up with Dr. Mariah Milling 10/2012. She had converted back to NSR. She had not started Xarelto. She had concerns with anticoagulation as a friend had a significant bleed on blood thinners and passed away from it. She was started on high dose ASA. Rate-control was resumed.   She was in her USOH until the date of admission. She had a mechanic fall, but sustained no trauma or injury. No presyncope or syncope. Later in the afternoon, her husband noticed she was confused, had trouble grabbing the remote, was word searching and "speaking jibberish." There may have been some slurred speech. No palpitations, lightheadedness, SOB/DOE, chest pain, unilateral leg/arm numbness/weakness or facial droop. Her husband notes she did not take her regularly scheduled metoprolol that AM. She  does report feeling dizzy recently which she attributed to dehydration.   She presented to [facility] ED, there an EKG revealed atrial fibrillation with RVR, (HR 150). TnI WNL. Noncontrast head CT showed R parietal and R temporal encephalomalacia c/w prior insult/infarct. Follow-up MRI confirmed an acute L MCA infarct involving the L posterior frontal lobe and anterior left parietal lobe. Remote R MCA infarct. No hemorrage. Cerebral atherosclerosis. She was started on diltiazem gtt and lopressor IV for rate-control. HR improved to 110s on this, but she exhibited intermittent 2-5s post-conversion pauses early this AM. She was asymptomatic with this. AVN blockers were held. Xarelto was started initially for anticoagulation. Neurology recommended 7 days of ASA alone before starting anticoagulation to minimize risk of hemorrhagic conversion.   HR currently 110-120s.   Problem List: Past Medical History  Diagnosis Date  . Allergy   . Asthma   . Diabetes mellitus   . GERD (gastroesophageal reflux disease)   . Hyperlipidemia   . Hypertension   . Kidney stones   . Rheumatic fever   . TMJ arthritis   . Heart murmur   . Ectopic pregnancy   . Hx of rheumatic fever 08/07/2011    Past Surgical History  Procedure Laterality Date  . Cholecystectomy    . Tubal ligation      ectopic preganacy   . Destruction trigeminal nerve via neurolytic agent  NOv @013     Gamma Knife      Allergies:  Allergies  Allergen Reactions  . Fish Allergy     Shell fish  . Penicillins   . Ace Inhibitors Cough  Home Medications: Prior to Admission medications   Medication Sig Start Date End Date Taking? Authorizing Provider  albuterol (VENTOLIN HFA) 108 (90 BASE) MCG/ACT inhaler inhale 2 puffs by mouth every 4 hours if needed for wheezing or shortness of breath 10/29/12  Yes Amy E Bedsole, MD  aspirin 81 MG tablet Take 81 mg by mouth daily.   Yes Historical Provider, MD  diltiazem (CARDIZEM CD) 120 MG 24 hr  capsule Take 1 capsule (120 mg total) by mouth daily. 10/14/12  Yes Roger A Arguello, PA-C  famotidine (PEPCID) 10 MG tablet Take 10 mg by mouth 2 (two) times daily as needed.   Yes Historical Provider, MD  losartan (COZAAR) 100 MG tablet take 1 tablet by mouth once daily 10/07/12  Yes [name], MD  metoprolol succinate (TOPROL-XL) 100 MG 24 hr tablet Take 100 mg by mouth 2 (two) times daily. Take with or immediately following a meal.  100 mg every morning. 50 mg at night 12/05/12  Yes Antonieta Iba, MD  glucose blood (ONE TOUCH ULTRA TEST) test strip Pt checks blood sugar twice a day and as needed.250.00 09/07/11   [name], MD  [facility] DELICA LANCETS MISC Checking blood sugar twice a day and as needed. 250.00 09/07/11   [name], MD    Inpatient Medications:  . aspirin EC  325 mg Oral Daily  . enoxaparin (LOVENOX) injection  40 mg Subcutaneous Q24H  . metoprolol succinate  100 mg Oral BID  . pantoprazole  40 mg Oral Daily   Prescriptions prior to admission  Medication Sig Dispense Refill  . albuterol (VENTOLIN HFA) 108 (90 BASE) MCG/ACT inhaler inhale 2 puffs by mouth every 4 hours if needed for wheezing or shortness of breath  8.5 g  1  . aspirin 81 MG tablet Take 81 mg by mouth daily.      Marland Kitchen diltiazem (CARDIZEM CD) 120 MG 24 hr capsule Take 1 capsule (120 mg total) by mouth daily.  90 capsule  3  . famotidine (PEPCID) 10 MG tablet Take 10 mg by mouth 2 (two) times daily as needed.      Marland Kitchen losartan (COZAAR) 100 MG tablet take 1 tablet by mouth once daily  30 tablet  5  . metoprolol succinate (TOPROL-XL) 100 MG 24 hr tablet Take 100 mg by mouth 2 (two) times daily. Take with or immediately following a meal.  100 mg every morning. 50 mg at night      . glucose blood (ONE TOUCH ULTRA TEST) test strip Pt checks blood sugar twice a day and as needed.250.00  100 each  11  . ONETOUCH DELICA LANCETS MISC Checking blood sugar twice a day and as needed. 250.00  100 each  11     Family History  Problem Relation Age of Onset  . Stroke Mother   . Diabetes Mother      History   Social History  . Marital Status: Married    Spouse Name: N/A    Number of Children: 2  . Years of Education: N/A   Occupational History  . cosmotologist    Social History Main Topics  . Smoking status: Former Smoker -- 2.00 packs/day    Types: Cigarettes    Quit date: 01/24/1968  . Smokeless tobacco: Never Used  . Alcohol Use: 0.6 oz/week    1 Glasses of wine per week  . Drug Use: No  . Sexual Activity: Not on file   Other Topics Concern  . Not  on file   Social History Narrative  . No narrative on file     Review of Systems: General: negative for chills, fever, night sweats or weight changes.  Cardiovascular: negative for chest pain, dyspnea on exertion, edema, orthopnea, palpitations, paroxysmal nocturnal dyspnea or shortness of breath Dermatological: negative for rash Respiratory: negative for cough or wheezing Urologic: negative for hematuria Abdominal: negative for nausea, vomiting, diarrhea, bright red blood per rectum, melena, or hematemesis Neurologic: negative for visual changes, syncope, or dizziness All other systems reviewed and are otherwise negative except as noted above.  Physical Exam: Blood pressure 167/94, pulse 123, temperature 98.4 F (36.9 C), temperature source Oral, resp. rate 26, height 5\' 3"  (1.6 m), weight 87.176 kg (192 lb 3 oz), SpO2 97.00%.    General: Well developed, well nourished, in no acute distress. Head: Normocephalic, atraumatic, sclera non-icteric, no xanthomas, nares are without discharge.  Neck: Negative for carotid bruits. JVD not elevated. Lungs: Clear bilaterally to auscultation without wheezes, rales, or rhonchi. Breathing is unlabored. Heart: Irregularly irregular, with S1 S2. No murmurs, rubs, or gallops appreciated. Abdomen: Soft, non-tender, non-distended with normoactive bowel sounds. No hepatomegaly. No  rebound/guarding. No obvious abdominal masses. Msk:  Strength and tone appears normal for age. Extremities: No clubbing, cyanosis or edema.  Distal pedal pulses are 2+ and equal bilaterally. Neuro: Alert and oriented X 3. Moves all extremities spontaneously. Psych:  Responds to questions appropriately with a normal affect.  Labs: Recent Labs     12/28/12  1803  12/28/12  1808  12/29/12  0705  WBC  7.5   --   8.8  HGB  15.0  16.0*  13.9  HCT  43.4  47.0*  40.2  MCV  90.0   --   89.7  PLT  213   --   208   Recent Labs Lab 12/28/12 1803 12/28/12 1808 12/29/12 0705  NA 138 141 140  K 3.2* 3.1* 3.8  CL 100 106 104  CO2 20  --  23  BUN 16 17 11   CREATININE 0.78 1.00 0.72  CALCIUM 9.2  --  8.7  PROT 7.5  --  6.5  BILITOT 0.5  --  0.7  ALKPHOS 98  --  80  ALT 22  --  19  AST 20  --  18  GLUCOSE 143* 149* 175*   Recent Labs     12/28/12  1805  12/29/12  0705  TROPONINI  <0.30  <0.30   Recent Labs     12/28/12  2348  CHOL  227*  HDL  50  LDLCALC  154*  TRIG  114  CHOLHDL  4.5   Radiology/Studies: Ct Head Wo Contrast  12/28/2012   CLINICAL DATA:  Code stroke. History of gamma knife surgery for trigeminal neuralgia.  EXAM: CT HEAD WITHOUT CONTRAST  TECHNIQUE: Contiguous axial images were obtained from the base of the skull through the vertex without contrast.  COMPARISON:  None  FINDINGS: There is focal encephalomalacia in the right temporal-parietal lobe with mild extension into the adjacent white matter. Small focus of low density in the left frontal-parietal subcortical white matter on sequence 2, image 19. There is additional subcortical white matter disease in the right frontal lobe. No evidence for acute hemorrhage, mass lesion, midline shift or hydrocephalus. No evidence for a new large acute or subacute infarct. No acute bone abnormality  IMPRESSION: Negative for acute hemorrhage.  Encephalomalacia involving the right parietal and right temporal cortex. Findings are  consistent  with a previous insult or infarct.  Scattered areas of white matter disease could represent chronic small vessel ischemic changes.   Electronically Signed   By: Richarda Overlie M.D.   On: 12/28/2012 18:48   Mr Maxine Glenn Head Wo Contrast  12/28/2012   CLINICAL DATA:  Age indeterminate infarcts. Remote history of treated trigeminal neuralgia.  EXAM: MRI HEAD WITHOUT AND WITH CONTRAST  MRA HEAD WITHOUT CONTRAST  TECHNIQUE: Multiplanar, multiecho pulse sequences of the brain and surrounding structures were obtained without and with intravenous contrast. Angiographic images of the head were obtained using MRA technique without contrast.  CONTRAST:  20mL MULTIHANCE GADOBENATE DIMEGLUMINE 529 MG/ML IV SOLN  COMPARISON:  CT of the head December 28, 2012 at 1816 hr  FINDINGS: MRI HEAD FINDINGS  Approximately 24 x 17 mm wedge-like area of reduced diffusion within the left posterior frontal lobe, and the anterior aspect the left parietal lobe (post central gyrus), about the central sulcus, with corresponding low ADC values. 3 mm focus of reduced diffusion within the left insula cortex. Reduced diffusion, with normalized ADC values within bifrontal frontal lobes (distinct from left frontal acute ischemia) suggests subacute infarct. Right temporal parietal encephalomalacia associated with intrinsic T1 shortening most consistent with mineralization and mild ex vacuo dilatation of the subjacent ventricle, no hydrocephalus. No susceptibility artifact to suggest hemorrhage. No midline shift or mass effect. No suspicious intraparenchymal nor leptomeningeal enhancement. Very faint enhancement at the left 5th cranial nerve root entry zone may reflect post procedural change.  No abnormal extra-axial fluid collections. No suspicious calvarial bone marrow signal. No abnormal sellar expansion. Craniocervical junction maintained. Trace paranasal sinus mucosal thickening without air-fluid levels. Mastoid air cells appear well-aerated.  MRA  HEAD FINDINGS  Anterior circulation: Normal flow related enhancement of the included cervical, the petrous, cavernous and supra clinoid internal carotid arteries. Patent anterior communicating artery. Normal flow related enhancement of bilateral middle cerebral artery bifurcations and bilateral M1 segments, however there is poor flow related enhancement/occlusion of the left M2/3 branches associated with mild irregularity of the proximal vessel.  Posterior circulation: Right vertebral artery is dominant, the left vertebral artery terminates in a posterior inferior cerebellar artery. Normal flow related enhancement of the basilar artery; bilateral posterior communicating arteries contributing to the posterior circulation with normal flow related enhancement of the posterior cerebral arteries.  No aneurysm, no suspicious luminal irregularity.  IMPRESSION: MRI of the head: Acute left middle cerebral artery territory infarct involving the left posterior frontal lobe, anterior aspect of the left parietal lobe, without hemorrhagic conversion. Punctate focus of acute ischemia within the left insula.  Subacute additional bilateral middle cerebral artery territory infarcts, less likely related to hypoperfusion/watershed.  Right temporal parietal encephalomalacia most consistent with remote right middle cerebral artery territory infarct.  MRA of the head: Poor flow related enhancement of the left M2 and M3 branches concerning for slow flow versus occlusion with mild irregularity of the residual left M2 vessels which may reflect atherosclerosis. No high-grade stenosis.  Critical Value/emergent results were called by telephone at the time of interpretation on 12/28/2012 at 11:26 PM to Dr.P Allena Katz, who verbally acknowledged these results.   Electronically Signed   By: Awilda Metro   On: 12/28/2012 23:27   Mr Brain W Wo Contrast  12/28/2012   CLINICAL DATA:  Age indeterminate infarcts. Remote history of treated trigeminal  neuralgia.  EXAM: MRI HEAD WITHOUT AND WITH CONTRAST  MRA HEAD WITHOUT CONTRAST  TECHNIQUE: Multiplanar, multiecho pulse sequences of the brain and surrounding  structures were obtained without and with intravenous contrast. Angiographic images of the head were obtained using MRA technique without contrast.  CONTRAST:  20mL MULTIHANCE GADOBENATE DIMEGLUMINE 529 MG/ML IV SOLN  COMPARISON:  CT of the head December 28, 2012 at 1816 hr  FINDINGS: MRI HEAD FINDINGS  Approximately 24 x 17 mm wedge-like area of reduced diffusion within the left posterior frontal lobe, and the anterior aspect the left parietal lobe (post central gyrus), about the central sulcus, with corresponding low ADC values. 3 mm focus of reduced diffusion within the left insula cortex. Reduced diffusion, with normalized ADC values within bifrontal frontal lobes (distinct from left frontal acute ischemia) suggests subacute infarct. Right temporal parietal encephalomalacia associated with intrinsic T1 shortening most consistent with mineralization and mild ex vacuo dilatation of the subjacent ventricle, no hydrocephalus. No susceptibility artifact to suggest hemorrhage. No midline shift or mass effect. No suspicious intraparenchymal nor leptomeningeal enhancement. Very faint enhancement at the left 5th cranial nerve root entry zone may reflect post procedural change.  No abnormal extra-axial fluid collections. No suspicious calvarial bone marrow signal. No abnormal sellar expansion. Craniocervical junction maintained. Trace paranasal sinus mucosal thickening without air-fluid levels. Mastoid air cells appear well-aerated.  MRA HEAD FINDINGS  Anterior circulation: Normal flow related enhancement of the included cervical, the petrous, cavernous and supra clinoid internal carotid arteries. Patent anterior communicating artery. Normal flow related enhancement of bilateral middle cerebral artery bifurcations and bilateral M1 segments, however there is poor  flow related enhancement/occlusion of the left M2/3 branches associated with mild irregularity of the proximal vessel.  Posterior circulation: Right vertebral artery is dominant, the left vertebral artery terminates in a posterior inferior cerebellar artery. Normal flow related enhancement of the basilar artery; bilateral posterior communicating arteries contributing to the posterior circulation with normal flow related enhancement of the posterior cerebral arteries.  No aneurysm, no suspicious luminal irregularity.  IMPRESSION: MRI of the head: Acute left middle cerebral artery territory infarct involving the left posterior frontal lobe, anterior aspect of the left parietal lobe, without hemorrhagic conversion. Punctate focus of acute ischemia within the left insula.  Subacute additional bilateral middle cerebral artery territory infarcts, less likely related to hypoperfusion/watershed.  Right temporal parietal encephalomalacia most consistent with remote right middle cerebral artery territory infarct.  MRA of the head: Poor flow related enhancement of the left M2 and M3 branches concerning for slow flow versus occlusion with mild irregularity of the residual left M2 vessels which may reflect atherosclerosis. No high-grade stenosis.  Critical Value/emergent results were called by telephone at the time of interpretation on 12/28/2012 at 11:26 PM to Dr.P Allena Katz, who verbally acknowledged these results.   Electronically Signed   By: Awilda Metro   On: 12/28/2012 23:27   EKG: atrial fibrillation, 150 bpm, diffuse scooping ST depressions, no TW changes  ASSESSMENT AND PLAN:    67 y.o. female w/ PMHx s/f PAF (refused anticoagulation earlier this year), DM2, HTN, HLD, obesity and GERD/hiatal hernia who was admitted to [facility] for CVA.   1. Tachy-brady syndrome with post-termination pauses 2. Paroxysmal atrial fibrillation with RVR 3. Acute CVA, suspected thromboembolic source 4. Type 2 DM 5.  Hypertension 6. Hyperlipidemia 7. Obesity 8. GERD/hiatal hernia 9. Hypokalemia, repleted  The patient unfortunately presents with a likely cardioembolic CVA evidenced on MRI. Additionally, a remote R MCA infarct was appreciated further supporting a thromboembolic pattern. She is willing to start on Xarelto now. Per neuro recommendations, will start full-dose ASA for 7 days post-CVA  before starting Xarelto.   From a rhythm standpoint, the patient has exhibited atrial fibrillation with RVR with rates as a high as 150-160s in the setting of an acute CVA. This was controlled with diltiazem gtt and metoprolol IV on admission; however, the patient developed intermittent post-termination pauses, as long as 4-5s. Will need to weigh PPM vs maintaining NSR with antiarrhythmic therapy for tachy-brady syndrome. Will obtain EP consult to help with this decision. HR currently 110-120s at rest. Will replace Toprol-XL with Lopressor 50mg  q6h. Hold on diltiazem for now.    Signed, R. Hurman Horn, PA-C 12/29/2012, 12:01 PM   Patient seen and examined with Hurman Horn, PA-C. We discussed all aspects of the encounter. I agree with the assessment and plan as stated above.   Review of telemetry shows AF with RVR and multiple post-termination pauses between 3.0-5.5 post-termination pauses.  We discussed options of anti-arrhythmic therapy to help her maintain SR and avoid post-termination pauses versus pacemaker implantation. For now, will continue rate-conrol efforts with short-acting metoprolol. Will ask EP to see her tomorrow to weigh in. Echo pending.   Agree with need for anti-coagulation.   Gabrielle Wakeland,MD 2:26 PM

## 2012-12-29 NOTE — Progress Notes (Addendum)
Stroke Team Progress Note  HISTORY 67 y.o. female with a past medical history significant for HTN, DM, hyperlipidemia, atrial fibrillation, asthma, s/p gamma knife surgery for left trigeminal neuralgia, brought to [facility] ED by ambulance as a code stroke due to acute onset of the above stated symptoms.  Although her husband said that she talked to her on the phone around 330 pm and she sound it normal, the last known well is not entirely clear as she was home alone the whole day and she said that she had a fall.  Husband said that around 330 or 4 pm he noticed that his wife was having difficulty finding words out and was slurring her words. They report no focal weakness, face droopiness, numbness or tingling, unsteadiness, confusion, or visual disturbances.No chest pain, shortness or breath, or palpitations.  Stated that she never had similar symptoms before.     Patient was not a TPA candidate secondary to resolution of symptoms.    SUBJECTIVE Aphasia has improved and pt is close to baseline.    OBJECTIVE Most recent Vital Signs: Filed Vitals:   12/29/12 0430 12/29/12 0500 12/29/12 0600 12/29/12 0700  BP: 143/89 151/89 137/73 150/86  Pulse: 96 113 99 118  Temp:    99 F (37.2 C)  TempSrc:    Oral  Resp: 31 16 25 27   Height:      Weight:      SpO2: 96% 96% 92% 96%   CBG (last 3)   Recent Labs  12/28/12 1829 12/29/12 0723  GLUCAP 139* 171*    IV Fluid Intake:   . sodium chloride 75 mL/hr at 12/28/12 2315  . diltiazem (CARDIZEM) infusion Stopped (12/29/12 0228)    MEDICATIONS  . aspirin  81 mg Oral Daily  . metoprolol succinate  100 mg Oral BID  . pantoprazole  40 mg Oral Daily  . rivaroxaban  20 mg Oral Q supper   PRN:  albuterol  Diet:  Cardiac  Activity:   Up with assistance DVT Prophylaxis:  SCDs  CLINICALLY SIGNIFICANT STUDIES Basic Metabolic Panel:  Recent Labs Lab 12/28/12 1803 12/28/12 1808 12/29/12 0705  NA 138 141 140  K 3.2* 3.1* 3.8  CL 100 106 104   CO2 20  --  23  GLUCOSE 143* 149* 175*  BUN 16 17 11   CREATININE 0.78 1.00 0.72  CALCIUM 9.2  --  8.7  MG  --   --  2.0   Liver Function Tests:  Recent Labs Lab 12/28/12 1803 12/29/12 0705  AST 20 18  ALT 22 19  ALKPHOS 98 80  BILITOT 0.5 0.7  PROT 7.5 6.5  ALBUMIN 4.4 3.7   CBC:  Recent Labs Lab 12/28/12 1803 12/28/12 1808 12/29/12 0705  WBC 7.5  --  8.8  NEUTROABS 5.1  --  6.6  HGB 15.0 16.0* 13.9  HCT 43.4 47.0* 40.2  MCV 90.0  --  89.7  PLT 213  --  208   Coagulation:  Recent Labs Lab 12/28/12 1803  LABPROT 12.2  INR 0.92   Cardiac Enzymes:  Recent Labs Lab 12/28/12 1805 12/29/12 0705  TROPONINI <0.30 <0.30   Urinalysis:  Recent Labs Lab 12/28/12 1836  COLORURINE YELLOW  LABSPEC 1.004*  PHURINE 6.0  GLUCOSEU NEGATIVE  HGBUR NEGATIVE  BILIRUBINUR NEGATIVE  KETONESUR NEGATIVE  PROTEINUR NEGATIVE  UROBILINOGEN 0.2  NITRITE NEGATIVE  LEUKOCYTESUR NEGATIVE   Lipid Panel    Component Value Date/Time   CHOL 227* 12/28/2012 2348   TRIG  114 12/28/2012 2348   HDL 50 12/28/2012 2348   CHOLHDL 4.5 12/28/2012 2348   VLDL 23 12/28/2012 2348   LDLCALC 154* 12/28/2012 2348   HgbA1C  Lab Results  Component Value Date   HGBA1C 8.1* 11/18/2012    Urine Drug Screen:     Component Value Date/Time   LABOPIA NONE DETECTED 12/28/2012 1836   COCAINSCRNUR NONE DETECTED 12/28/2012 1836   LABBENZ NONE DETECTED 12/28/2012 1836   AMPHETMU NONE DETECTED 12/28/2012 1836   THCU NONE DETECTED 12/28/2012 1836   LABBARB NONE DETECTED 12/28/2012 1836    Alcohol Level:  Recent Labs Lab 12/28/12 1803  ETH <11    Ct Head Wo Contrast  12/28/2012   CLINICAL DATA:  Code stroke. History of gamma knife surgery for trigeminal neuralgia.  EXAM: CT HEAD WITHOUT CONTRAST  TECHNIQUE: Contiguous axial images were obtained from the base of the skull through the vertex without contrast.  COMPARISON:  None  FINDINGS: There is focal encephalomalacia in the right temporal-parietal  lobe with mild extension into the adjacent white matter. Small focus of low density in the left frontal-parietal subcortical white matter on sequence 2, image 19. There is additional subcortical white matter disease in the right frontal lobe. No evidence for acute hemorrhage, mass lesion, midline shift or hydrocephalus. No evidence for a new large acute or subacute infarct. No acute bone abnormality  IMPRESSION: Negative for acute hemorrhage.  Encephalomalacia involving the right parietal and right temporal cortex. Findings are consistent with a previous insult or infarct.  Scattered areas of white matter disease could represent chronic small vessel ischemic changes.   Electronically Signed   By: Richarda Overlie M.D.   On: 12/28/2012 18:48   Mr Maxine Glenn Head Wo Contrast  12/28/2012   CLINICAL DATA:  Age indeterminate infarcts. Remote history of treated trigeminal neuralgia.  EXAM: MRI HEAD WITHOUT AND WITH CONTRAST  MRA HEAD WITHOUT CONTRAST  TECHNIQUE: Multiplanar, multiecho pulse sequences of the brain and surrounding structures were obtained without and with intravenous contrast. Angiographic images of the head were obtained using MRA technique without contrast.  CONTRAST:  20mL MULTIHANCE GADOBENATE DIMEGLUMINE 529 MG/ML IV SOLN  COMPARISON:  CT of the head December 28, 2012 at 1816 hr  FINDINGS: MRI HEAD FINDINGS  Approximately 24 x 17 mm wedge-like area of reduced diffusion within the left posterior frontal lobe, and the anterior aspect the left parietal lobe (post central gyrus), about the central sulcus, with corresponding low ADC values. 3 mm focus of reduced diffusion within the left insula cortex. Reduced diffusion, with normalized ADC values within bifrontal frontal lobes (distinct from left frontal acute ischemia) suggests subacute infarct. Right temporal parietal encephalomalacia associated with intrinsic T1 shortening most consistent with mineralization and mild ex vacuo dilatation of the subjacent ventricle,  no hydrocephalus. No susceptibility artifact to suggest hemorrhage. No midline shift or mass effect. No suspicious intraparenchymal nor leptomeningeal enhancement. Very faint enhancement at the left 5th cranial nerve root entry zone may reflect post procedural change.  No abnormal extra-axial fluid collections. No suspicious calvarial bone marrow signal. No abnormal sellar expansion. Craniocervical junction maintained. Trace paranasal sinus mucosal thickening without air-fluid levels. Mastoid air cells appear well-aerated.  MRA HEAD FINDINGS  Anterior circulation: Normal flow related enhancement of the included cervical, the petrous, cavernous and supra clinoid internal carotid arteries. Patent anterior communicating artery. Normal flow related enhancement of bilateral middle cerebral artery bifurcations and bilateral M1 segments, however there is poor flow related enhancement/occlusion of the left M2/3  branches associated with mild irregularity of the proximal vessel.  Posterior circulation: Right vertebral artery is dominant, the left vertebral artery terminates in a posterior inferior cerebellar artery. Normal flow related enhancement of the basilar artery; bilateral posterior communicating arteries contributing to the posterior circulation with normal flow related enhancement of the posterior cerebral arteries.  No aneurysm, no suspicious luminal irregularity.  IMPRESSION: MRI of the head: Acute left middle cerebral artery territory infarct involving the left posterior frontal lobe, anterior aspect of the left parietal lobe, without hemorrhagic conversion. Punctate focus of acute ischemia within the left insula.  Subacute additional bilateral middle cerebral artery territory infarcts, less likely related to hypoperfusion/watershed.  Right temporal parietal encephalomalacia most consistent with remote right middle cerebral artery territory infarct.  MRA of the head: Poor flow related enhancement of the left M2  and M3 branches concerning for slow flow versus occlusion with mild irregularity of the residual left M2 vessels which may reflect atherosclerosis. No high-grade stenosis.  Critical Value/emergent results were called by telephone at the time of interpretation on 12/28/2012 at 11:26 PM to Dr.P Allena Katz, who verbally acknowledged these results.   Electronically Signed   By: Awilda Metro   On: 12/28/2012 23:27   Mr Brain W Wo Contrast  12/28/2012   CLINICAL DATA:  Age indeterminate infarcts. Remote history of treated trigeminal neuralgia.  EXAM: MRI HEAD WITHOUT AND WITH CONTRAST  MRA HEAD WITHOUT CONTRAST  TECHNIQUE: Multiplanar, multiecho pulse sequences of the brain and surrounding structures were obtained without and with intravenous contrast. Angiographic images of the head were obtained using MRA technique without contrast.  CONTRAST:  20mL MULTIHANCE GADOBENATE DIMEGLUMINE 529 MG/ML IV SOLN  COMPARISON:  CT of the head December 28, 2012 at 1816 hr  FINDINGS: MRI HEAD FINDINGS  Approximately 24 x 17 mm wedge-like area of reduced diffusion within the left posterior frontal lobe, and the anterior aspect the left parietal lobe (post central gyrus), about the central sulcus, with corresponding low ADC values. 3 mm focus of reduced diffusion within the left insula cortex. Reduced diffusion, with normalized ADC values within bifrontal frontal lobes (distinct from left frontal acute ischemia) suggests subacute infarct. Right temporal parietal encephalomalacia associated with intrinsic T1 shortening most consistent with mineralization and mild ex vacuo dilatation of the subjacent ventricle, no hydrocephalus. No susceptibility artifact to suggest hemorrhage. No midline shift or mass effect. No suspicious intraparenchymal nor leptomeningeal enhancement. Very faint enhancement at the left 5th cranial nerve root entry zone may reflect post procedural change.  No abnormal extra-axial fluid collections. No suspicious  calvarial bone marrow signal. No abnormal sellar expansion. Craniocervical junction maintained. Trace paranasal sinus mucosal thickening without air-fluid levels. Mastoid air cells appear well-aerated.  MRA HEAD FINDINGS  Anterior circulation: Normal flow related enhancement of the included cervical, the petrous, cavernous and supra clinoid internal carotid arteries. Patent anterior communicating artery. Normal flow related enhancement of bilateral middle cerebral artery bifurcations and bilateral M1 segments, however there is poor flow related enhancement/occlusion of the left M2/3 branches associated with mild irregularity of the proximal vessel.  Posterior circulation: Right vertebral artery is dominant, the left vertebral artery terminates in a posterior inferior cerebellar artery. Normal flow related enhancement of the basilar artery; bilateral posterior communicating arteries contributing to the posterior circulation with normal flow related enhancement of the posterior cerebral arteries.  No aneurysm, no suspicious luminal irregularity.  IMPRESSION: MRI of the head: Acute left middle cerebral artery territory infarct involving the left posterior frontal lobe, anterior aspect  of the left parietal lobe, without hemorrhagic conversion. Punctate focus of acute ischemia within the left insula.  Subacute additional bilateral middle cerebral artery territory infarcts, less likely related to hypoperfusion/watershed.  Right temporal parietal encephalomalacia most consistent with remote right middle cerebral artery territory infarct.  MRA of the head: Poor flow related enhancement of the left M2 and M3 branches concerning for slow flow versus occlusion with mild irregularity of the residual left M2 vessels which may reflect atherosclerosis. No high-grade stenosis.  Critical Value/emergent results were called by telephone at the time of interpretation on 12/28/2012 at 11:26 PM to Dr.P Allena Katz, who verbally acknowledged  these results.   Electronically Signed   By: Awilda Metro   On: 12/28/2012 23:27    CT of the brain  Encephalomalacia involving the right parietal and right temporal  cortex. Findings are consistent with a previous insult or infarct.   MRI of the brain  Acute left middle cerebral artery territory infarct  involving the left posterior frontal lobe, anterior aspect of the  left parietal lobe, without hemorrhagic conversion. Punctate focus  of acute ischemia within the left insula.  Subacute additional bilateral middle cerebral artery territory  infarcts, less likely related to hypoperfusion/watershed.  MRA of the brain  Poor flow related enhancement of the left M2 and M3  branches concerning for slow flow versus occlusion with mild  irregularity of the residual left M2 vessels which may reflect  atherosclerosis. No high-grade stenosis.   2D Echocardiogram    Carotid Doppler    CXR    EKG paroxismal A-fib  Therapy Recommendations pending  Physical Exam   Neurologic Examination:  Mental Status:  Alert, awake, oriented x 4, thought content appropriate. No dysarthria but mild dysphasia characterized by difficulty finding words out. Able to follow 3 step commands without difficulty.  Cranial Nerves:  II: Discs flat bilaterally; Visual fields grossly normal, pupils equal, round, reactive to light and accommodation  III,IV, VI: ptosis not present, extra-ocular motions intact bilaterally  V,VII: smile symmetric, facial light touch sensation normal bilaterally  VIII: hearing normal bilaterally  IX,X: gag reflex present  XI: bilateral shoulder shrug  XII: midline tongue extension without atrophy or fasciculations  Motor:  Right : Upper extremity 5/5 Left: Upper extremity 5/5  Lower extremity 5/5 Lower extremity 5/5  Tone and bulk:normal tone throughout; no atrophy noted  Sensory: Pinprick and light touch intact throughout, bilaterally  Deep Tendon Reflexes:  Right: Upper  Extremity Left: Upper extremity  biceps (C-5 to C-6) 2/4 biceps (C-5 to C-6) 2/4  tricep (C7) 2/4 triceps (C7) 2/4  Brachioradialis (C6) 2/4 Brachioradialis (C6) 2/4  Lower Extremity Lower Extremity  quadriceps (L-2 to L-4) 2/4 quadriceps (L-2 to L-4) 2/4  Achilles (S1) 2/4 Achilles (S1) 2/4  Plantars:  Right: downgoing Left: downgoing  Cerebellar:  normal finger-to-nose, normal heel-to-shin test  Gait:  No ataxia.     ASSESSMENT 67 y.o. female with multiple risk factors for stroke brought in with mild dysphasia that has improved. Initial NIHSS 2 (dysphasia and mild right face weakness that is not present anymore), but current NIHSS 1. MRI as above. Pt was diagnosed with A-fib a few months back which was rate controlled with diltiazem. Was recommended xarelto for refused starting it here. Pt was started on xarelto in hospital    Embolic strokes.   Hospital day # 1  TREATMENT/PLAN  Carotid dopplers  PT/OT  Speech  Tele  Echo  Would hold xarelto for about 7 days post  stroke and start it then since there is increased risk of hemorrhagic conversion acutely when starting anticoagulation.    Pt was explained that she will need long term anticoagulation.   Started ASA 325 for 1 week before starting anticoagulation  Ajdin Macke  12/29/2012 9:23 AM

## 2012-12-30 ENCOUNTER — Inpatient Hospital Stay (HOSPITAL_COMMUNITY): Payer: Medicare Other

## 2012-12-30 ENCOUNTER — Encounter (HOSPITAL_COMMUNITY)
Admission: EM | Disposition: A | Discharge status: Home / Self Care | Payer: Self-pay | Source: Home / Self Care | Attending: Internal Medicine

## 2012-12-30 DIAGNOSIS — I495 Sick sinus syndrome: Secondary | ICD-10-CM

## 2012-12-30 HISTORY — PX: PACEMAKER INSERTION: SHX728

## 2012-12-30 HISTORY — PX: PERMANENT PACEMAKER INSERTION: SHX5480

## 2012-12-30 LAB — GLUCOSE, CAPILLARY
Glucose-Capillary: 158 mg/dL — ABNORMAL HIGH (ref 70–99)
Glucose-Capillary: 126 mg/dL — ABNORMAL HIGH (ref 70–99)

## 2012-12-30 LAB — TSH: TSH: 1.404 u[IU]/mL (ref 0.350–4.500)

## 2012-12-30 SURGERY — PERMANENT PACEMAKER INSERTION
Anesthesia: LOCAL

## 2012-12-30 MED ORDER — INSULIN GLARGINE 100 UNIT/ML ~~LOC~~ SOLN
10.0000 [IU] | Freq: Every day | SUBCUTANEOUS | Status: DC
  Filled 2012-12-30 (×3): qty 0.1

## 2012-12-30 MED ORDER — SODIUM CHLORIDE 0.9 % IV SOLN
INTRAVENOUS | Status: DC
  Administered 2012-12-30: 13:00:00 via INTRAVENOUS

## 2012-12-30 MED ORDER — LIDOCAINE HCL (PF) 1 % IJ SOLN
INTRAMUSCULAR | Status: AC
  Filled 2012-12-30: qty 30

## 2012-12-30 MED ORDER — SODIUM CHLORIDE 0.9 % IV SOLN: INTRAVENOUS | Status: AC

## 2012-12-30 MED ORDER — INSULIN ASPART 100 UNIT/ML ~~LOC~~ SOLN
0.0000 [IU] | Freq: Three times a day (TID) | SUBCUTANEOUS | Status: DC
  Administered 2012-12-30 – 2012-12-31 (×2): 1 [IU] via SUBCUTANEOUS
  Administered 2012-12-31 (×2): 2 [IU] via SUBCUTANEOUS

## 2012-12-30 MED ORDER — YOU HAVE A PACEMAKER BOOK
Freq: Once | Status: AC
  Administered 2012-12-30: 21:00:00
  Filled 2012-12-30: qty 1

## 2012-12-30 MED ORDER — ONDANSETRON HCL 4 MG/2ML IJ SOLN: 4.0000 mg | Freq: Four times a day (QID) | INTRAMUSCULAR | Status: DC | PRN

## 2012-12-30 MED ORDER — SODIUM CHLORIDE 0.9 % IR SOLN
80.0000 mg | Status: DC
  Filled 2012-12-30: qty 2

## 2012-12-30 MED ORDER — VANCOMYCIN HCL IN DEXTROSE 1-5 GM/200ML-% IV SOLN
1000.0000 mg | INTRAVENOUS | Status: DC
  Filled 2012-12-30: qty 200

## 2012-12-30 MED ORDER — ACETAMINOPHEN 325 MG PO TABS
325.0000 mg | ORAL_TABLET | ORAL | Status: DC | PRN
  Administered 2012-12-31 – 2013-01-01 (×3): 650 mg via ORAL
  Filled 2012-12-30 (×3): qty 2

## 2012-12-30 MED ORDER — SIMVASTATIN 40 MG PO TABS
40.0000 mg | ORAL_TABLET | Freq: Every day | ORAL | Status: DC
  Administered 2012-12-30: 21:00:00 40 mg via ORAL
  Filled 2012-12-30 (×2): qty 1

## 2012-12-30 MED ORDER — HEPARIN (PORCINE) IN NACL 2-0.9 UNIT/ML-% IJ SOLN
INTRAMUSCULAR | Status: AC
  Filled 2012-12-30: qty 500

## 2012-12-30 MED ORDER — VANCOMYCIN HCL IN DEXTROSE 1-5 GM/200ML-% IV SOLN
1000.0000 mg | Freq: Two times a day (BID) | INTRAVENOUS | Status: AC
  Administered 2012-12-31: 1000 mg via INTRAVENOUS
  Filled 2012-12-30: qty 200

## 2012-12-30 MED ORDER — MIDAZOLAM HCL 5 MG/5ML IJ SOLN
INTRAMUSCULAR | Status: AC
  Filled 2012-12-30: qty 5

## 2012-12-30 MED ORDER — ATORVASTATIN CALCIUM 10 MG PO TABS: 10.0000 mg | ORAL_TABLET | Freq: Every day | ORAL | Status: DC

## 2012-12-30 MED ORDER — CHLORHEXIDINE GLUCONATE 4 % EX LIQD
60.0000 mL | Freq: Once | CUTANEOUS | Status: AC
  Administered 2012-12-30: 4 via TOPICAL
  Filled 2012-12-30: qty 15

## 2012-12-30 MED ORDER — SODIUM CHLORIDE 0.9 % IV SOLN
INTRAVENOUS | Status: DC
  Administered 2012-12-30: 12:00:00 via INTRAVENOUS

## 2012-12-30 MED ORDER — FENTANYL CITRATE 0.05 MG/ML IJ SOLN
INTRAMUSCULAR | Status: AC
  Filled 2012-12-30: qty 2

## 2012-12-30 MED ORDER — METOPROLOL TARTRATE 100 MG PO TABS
100.0000 mg | ORAL_TABLET | Freq: Two times a day (BID) | ORAL | Status: DC
  Administered 2012-12-30 – 2012-12-31 (×2): 100 mg via ORAL
  Filled 2012-12-30: qty 4
  Filled 2012-12-30 (×3): qty 1

## 2012-12-30 NOTE — Consult Note (Signed)
ELECTROPHYSIOLOGY CONSULT NOTE  Patient ID: Nina Carpenter MRN: [id number], DOB/AGE: 27-Sep-1945   Admit date: 12/28/2012 Date of Consult: 12/30/2012  Primary Physician: [name], MD Primary Cardiologist: Mariah Milling, MD Reason for Consultation: Tachy-brady syndrome with post-termination pauses  History of Present Illness Nina Carpenter is a 67 y.o. female with PAF (refused anticoagulation earlier this year), DM, HTN, asthma, dyslipidemia and obesity who was admitted to [facility] for acute L MCA CVA. On admission, she was found to have rapid AF with rate 150-160 bpm. She was started on IV diltiazem and metoprolol for rate control. She then had post-termination pauses with spontaneous conversion to SR, longest 5.5 seconds in duration. Therefore, EP has been asked to see for rhythm management.  Nina Carpenter denies CP or SOB. She denies palpitations and states she has no idea when her heart is out of rhythm. She reports intermittent "dizzy spells" which occur once or twice a month. She denies frank syncope. While here she has been asymptomatic with pauses. Xarelto has been recommended for anticoagulation. Neurology recommended 7 days of ASA alone before starting anticoagulation to minimize risk of hemorrhagic conversion.   Past Medical History Past Medical History  Diagnosis Date  . Allergy   . Asthma   . Type 2 diabetes mellitus   . GERD (gastroesophageal reflux disease)   . Hyperlipidemia   . Hypertension   . Kidney stones   . Rheumatic fever   . TMJ arthritis   . Heart murmur   . Ectopic pregnancy   . Hx of rheumatic fever 08/07/2011  . PAF (paroxysmal atrial fibrillation)     Refused Xarelto 09/2012 due to risk of bleeding  . Obesity     Past Surgical History Past Surgical History  Procedure Laterality Date  . Cholecystectomy    . Tubal ligation      ectopic preganacy   . Destruction trigeminal nerve via neurolytic agent  NOv @013     Gamma Knife       Allergies/Intolerances Allergies  Allergen Reactions  . Fish Allergy     Shell fish  . Penicillins   . Ace Inhibitors Cough    Current Home Medications Medications Prior to Admission  Medication Sig Dispense Refill  . albuterol (VENTOLIN HFA) 108 (90 BASE) MCG/ACT inhaler inhale 2 puffs by mouth every 4 hours if needed for wheezing or shortness of breath  8.5 g  1  . aspirin 81 MG tablet Take 81 mg by mouth daily.      Marland Kitchen diltiazem (CARDIZEM CD) 120 MG 24 hr capsule Take 1 capsule (120 mg total) by mouth daily.  90 capsule  3  . famotidine (PEPCID) 10 MG tablet Take 10 mg by mouth 2 (two) times daily as needed.      Marland Kitchen losartan (COZAAR) 100 MG tablet take 1 tablet by mouth once daily  30 tablet  5  . metoprolol succinate (TOPROL-XL) 100 MG 24 hr tablet Take 100 mg by mouth 2 (two) times daily. Take with or immediately following a meal.  100 mg every morning. 50 mg at night      . glucose blood (ONE TOUCH ULTRA TEST) test strip Pt checks blood sugar twice a day and as needed.250.00  100 each  11  . ONETOUCH DELICA LANCETS MISC Checking blood sugar twice a day and as needed. 250.00  100 each  11    Inpatient Medications . aspirin EC  325 mg Oral Daily  . enoxaparin (LOVENOX) injection  40 mg  Subcutaneous Q24H  . metoprolol tartrate  50 mg Oral QID  . pantoprazole  40 mg Oral Daily   . sodium chloride Stopped (12/29/12 1242)  . diltiazem (CARDIZEM) infusion Stopped (12/29/12 0228)    Family History Family History  Problem Relation Age of Onset  . Stroke Mother   . Diabetes Mother      Social History Social History  . Marital Status: Married   Social History Main Topics  . Smoking status: Former Smoker -- 2.00 packs/day    Types: Cigarettes    Quit date: 01/24/1968  . Smokeless tobacco: Never Used  . Alcohol Use: 0.6 oz/week    1 Glasses of wine per week  . Drug Use: No   Review of Systems General: No chills, fever, night sweats or weight changes  Cardiovascular:  No  chest pain, dyspnea on exertion, edema, orthopnea, palpitations, paroxysmal nocturnal dyspnea Dermatological: No rash, lesions or masses Respiratory: No cough, dyspnea Urologic: No hematuria, dysuria Abdominal: No nausea, vomiting, diarrhea, bright red blood per rectum, melena, or hematemesis Neurologic: No visual changes, weakness, changes in mental status All other systems reviewed and are otherwise negative except as noted above.  Physical Exam Vitals: Blood pressure 178/75, pulse 123, temperature 97.8 F (36.6 C), temperature source Oral, resp. rate 20, height 5\' 3"  (1.6 m), weight 192 lb 3 oz (87.176 kg), SpO2 100.00%.  General: Well developed, well appearing 67 y.o. female in no acute distress. HEENT: Normocephalic, atraumatic. EOMs intact. Sclera nonicteric. Oropharynx clear.  Neck: Supple without bruits. No JVD. Lungs: Respirations regular and unlabored, CTA bilaterally. No wheezes, rales or rhonchi. Heart: RRR. S1, S2 present. No murmurs, rub, S3 or S4. Abdomen: Soft, non-tender, non-distended. BS present x 4 quadrants. No hepatosplenomegaly.  Extremities: No clubbing, cyanosis or edema. DP/PT/Radials 2+ and equal bilaterally. Psych: Normal affect. Neuro: Alert and oriented X 3. Moves all extremities spontaneously. Musculoskeletal: No kyphosis. Skin: Intact. Warm and dry. No rashes or petechiae in exposed areas.   Labs  Recent Labs  12/28/12 1805 12/29/12 0705  TROPONINI <0.30 <0.30   Lab Results  Component Value Date   WBC 8.8 12/29/2012   HGB 13.9 12/29/2012   HCT 40.2 12/29/2012   MCV 89.7 12/29/2012   PLT 208 12/29/2012    Recent Labs Lab 12/29/12 0705  NA 140  K 3.8  CL 104  CO2 23  BUN 11  CREATININE 0.72  CALCIUM 8.7  PROT 6.5  BILITOT 0.7  ALKPHOS 80  ALT 19  AST 18  GLUCOSE 175*   Lab Results  Component Value Date   CHOL 227* 12/28/2012   HDL 50 12/28/2012   LDLCALC 154* 12/28/2012   TRIG 114 12/28/2012   No results found for this basename: TSH,  T4TOTAL, FREET3, T3FREE, THYROIDAB,  in the last 72 hours  Recent Labs  12/28/12 1803  INR 0.92    Radiology/Studies Ct Head Wo Contrast 12/28/2012    IMPRESSION: Negative for acute hemorrhage.  Encephalomalacia involving the right parietal and right temporal cortex. Findings are consistent with a previous insult or infarct.  Scattered areas of white matter disease could represent chronic small vessel ischemic changes.    Electronically Signed   By: Richarda Overlie M.D.   On: 12/28/2012 18:48   Mr Maxine Glenn Head Wo Contrast 12/28/2012      IMPRESSION: MRI of the head: Acute left middle cerebral artery territory infarct involving the left posterior frontal lobe, anterior aspect of the left parietal lobe, without hemorrhagic conversion. Punctate focus  of acute ischemia within the left insula.  Subacute additional bilateral middle cerebral artery territory infarcts, less likely related to hypoperfusion/watershed.  Right temporal parietal encephalomalacia most consistent with remote right middle cerebral artery territory infarct.  MRA of the head: Poor flow related enhancement of the left M2 and M3 branches concerning for slow flow versus occlusion with mild irregularity of the residual left M2 vessels which may reflect atherosclerosis. No high-grade stenosis.   Critical Value/emergent results were called by telephone at the time of interpretation on 12/28/2012 at 11:26 PM to Dr.P Allena Katz, who verbally acknowledged these results.    Electronically Signed   By: Awilda Metro   On: 12/28/2012 23:27   Mr Brain W Wo Contrast 12/28/2012    IMPRESSION: MRI of the head: Acute left middle cerebral artery territory infarct involving the left posterior frontal lobe, anterior aspect of the left parietal lobe, without hemorrhagic conversion. Punctate focus of acute ischemia within the left insula.  Subacute additional bilateral middle cerebral artery territory infarcts, less likely related to hypoperfusion/watershed.   Right temporal parietal encephalomalacia most consistent with remote right middle cerebral artery territory infarct.  MRA of the head: Poor flow related enhancement of the left M2 and M3 branches concerning for slow flow versus occlusion with mild irregularity of the residual left M2 vessels which may reflect atherosclerosis. No high-grade stenosis.   Critical Value/emergent results were called by telephone at the time of interpretation on 12/28/2012 at 11:26 PM to Dr.P Allena Katz, who verbally acknowledged these results.    Electronically Signed   By: Awilda Metro   On: 12/28/2012 23:27   Echocardiogram  Left ventricle: The cavity size was normal. Wall thickness was normal. Systolic function was mildly reduced. The estimated ejection fraction was in the range of 45% to 50%. Wall motion was normal; there were no regional wall motion abnormalities. ------------------------------------------------------------ Aortic valve: Structurally normal valve. Cusp separation was normal. Doppler: Transvalvular velocity was within the normal range. There was no stenosis. No regurgitation. ------------------------------------------------------------ Aorta: Aortic root: The aortic root was normal in size. Ascending aorta: The ascending aorta was normal in size. ------------------------------------------------------------ Mitral valve: Structurally normal valve. Leaflet separation was normal. Doppler: Transvalvular velocity was within the normal range. There was no evidence for stenosis. Trivial regurgitation. ------------------------------------------------------------ Left atrium: The atrium was normal in size. 37 mm. ------------------------------------------------------------ Right ventricle: The cavity size was normal. Systolic function was normal. ------------------------------------------------------------ Pulmonic valve: The valve appears to be grossly normal. Doppler: Trivial  regurgitation. ------------------------------------------------------------ Tricuspid valve: Structurally normal valve. Leaflet separation was normal. Doppler: Transvalvular velocity was within the normal range. Trivial regurgitation. ------------------------------------------------------------ Right atrium: The atrium was normal in size. ------------------------------------------------------------ Pericardium: There was no pericardial effusion. ------------------------------------------------------------ Systemic veins: Inferior vena cava: The vessel was normal in size; the respirophasic diameter changes were in the normal range (= 50%); findings are consistent with normal central venous pressure.  12-lead ECG - on admission - AF w/RVR at 150 bpm Telemetry - SR currently; rapid AF yesterday with multiple pauses, duration 3 - 6.8 seconds  Assessment and Plan 1. Tachy-brady syndrome with rapid AF and pauses 2. Paroxysmal atrial fibrillation, initially diagnosed Sept 2014 3. Acute L MCA CVA  4. DM  5. Hypertension  6. Hyperlipidemia  7. Obesity   Nina Carpenter presents with acute L MCA CVA. She has atrial fibrillation and tachy-brady syndrome. She meets criteria for PPM implantation. This will allow for continuation of rate controlling medications without precipitating / exacerbating bradycardia. Risks, benefits and alternatives to PPM  implantation were discussed in detail with Nina Carpenter and her husband today. These risks include, but are not limited to, bleeding, infection, pneumothorax, perforation, tamponade, vascular damage, renal failure, lead dislodgement MI, stroke or death. Nina Carpenter and her husband expressed verbal understanding and agree to proceed. In addition, we agree with anticoagulation for stroke prevention.  Dr. [name] to see. Please see recommendations below. Signed, Rick Duff, PA-C 12/30/2012, 8:50 AM

## 2012-12-30 NOTE — Progress Notes (Signed)
Speech Language Pathology Treatment:   Cancellation Patient Details Name: Nina Carpenter MRN: [id number] DOB: 1945/01/25 Today's Date: 12/30/2012 Time:  -     Pt. In cath lab.  Will attempt speech-language-cognitive assessment next date  Darrow Bussing.Ed [employer] [phone]  12/30/2012

## 2012-12-30 NOTE — Progress Notes (Signed)
Stroke Team Progress Note  HISTORY 67 y.o. female with a past medical history significant for HTN, DM, hyperlipidemia, atrial fibrillation, asthma, s/p gamma knife surgery for left trigeminal neuralgia, brought to [facility] ED by ambulance 12/28/2012 as a code stroke due to acute onset of difficulty speaking. Although her husband said that she talked to her on the phone around 330 pm and she sounded normal, the last known well is not entirely clear as she was home alone the whole day and she said that she had a fall. Husband said that around 330 or 4 pm he noticed that his wife was having difficulty finding words out and was slurring her words. They report no focal weakness, face droopiness, numbness or tingling, unsteadiness, confusion, or visual disturbances.No chest pain, shortness or breath, or palpitations. Stated that she never had similar symptoms before. Patient was not a TPA candidate secondary to resolution of symptoms and unclear last known well. She was admitted for further evaluation and treatment.  SUBJECTIVE Husband at the bedside. Patient is talkative and friendly.  OBJECTIVE Most recent Vital Signs: Filed Vitals:   12/30/12 0200 12/30/12 0400 12/30/12 0600 12/30/12 0814  BP: 118/60 152/59 178/75   Pulse:      Temp:  98.6 F (37 C)  97.8 F (36.6 C)  TempSrc:  Oral  Oral  Resp:  18 20 20   Height:      Weight:      SpO2:  97%  100%   CBG (last 3)   Recent Labs  12/29/12 1135 12/29/12 1622 12/29/12 2130  GLUCAP 176* 167* 161*    IV Fluid Intake:   . sodium chloride Stopped (12/29/12 1242)  . diltiazem (CARDIZEM) infusion Stopped (12/29/12 0228)    MEDICATIONS  . aspirin EC  325 mg Oral Daily  . enoxaparin (LOVENOX) injection  40 mg Subcutaneous Q24H  . metoprolol tartrate  50 mg Oral QID  . pantoprazole  40 mg Oral Daily   PRN:  albuterol, hydrALAZINE  Diet:  Cardiac thin lqiuids Activity:   Up with assistance DVT Prophylaxis:  Lovenox 40 mg sq daily    CLINICALLY SIGNIFICANT STUDIES Basic Metabolic Panel:   Recent Labs Lab 12/28/12 1803 12/28/12 1808 12/29/12 0705  NA 138 141 140  K 3.2* 3.1* 3.8  CL 100 106 104  CO2 20  --  23  GLUCOSE 143* 149* 175*  BUN 16 17 11   CREATININE 0.78 1.00 0.72  CALCIUM 9.2  --  8.7  MG  --   --  2.0   Liver Function Tests:   Recent Labs Lab 12/28/12 1803 12/29/12 0705  AST 20 18  ALT 22 19  ALKPHOS 98 80  BILITOT 0.5 0.7  PROT 7.5 6.5  ALBUMIN 4.4 3.7   CBC:   Recent Labs Lab 12/28/12 1803 12/28/12 1808 12/29/12 0705  WBC 7.5  --  8.8  NEUTROABS 5.1  --  6.6  HGB 15.0 16.0* 13.9  HCT 43.4 47.0* 40.2  MCV 90.0  --  89.7  PLT 213  --  208   Coagulation:   Recent Labs Lab 12/28/12 1803  LABPROT 12.2  INR 0.92   Cardiac Enzymes:   Recent Labs Lab 12/28/12 1805 12/29/12 0705  TROPONINI <0.30 <0.30   Urinalysis:   Recent Labs Lab 12/28/12 1836  COLORURINE YELLOW  LABSPEC 1.004*  PHURINE 6.0  GLUCOSEU NEGATIVE  HGBUR NEGATIVE  BILIRUBINUR NEGATIVE  KETONESUR NEGATIVE  PROTEINUR NEGATIVE  UROBILINOGEN 0.2  NITRITE NEGATIVE  LEUKOCYTESUR NEGATIVE  Lipid Panel    Component Value Date/Time   CHOL 227* 12/28/2012 2348   TRIG 114 12/28/2012 2348   HDL 50 12/28/2012 2348   CHOLHDL 4.5 12/28/2012 2348   VLDL 23 12/28/2012 2348   LDLCALC 154* 12/28/2012 2348   HgbA1C  Lab Results  Component Value Date   HGBA1C 7.9* 12/29/2012    Urine Drug Screen:     Component Value Date/Time   LABOPIA NONE DETECTED 12/28/2012 1836   COCAINSCRNUR NONE DETECTED 12/28/2012 1836   LABBENZ NONE DETECTED 12/28/2012 1836   AMPHETMU NONE DETECTED 12/28/2012 1836   THCU NONE DETECTED 12/28/2012 1836   LABBARB NONE DETECTED 12/28/2012 1836    Alcohol Level:   Recent Labs Lab 12/28/12 1803  ETH <11     CT of the brain  12/28/2012    Negative for acute hemorrhage.  Encephalomalacia involving the right parietal and right temporal cortex. Findings are consistent with a  previous insult or infarct.  Scattered areas of white matter disease could represent chronic small vessel ischemic changes.     MRI of the brain  12/28/2012   Acute left middle cerebral artery territory infarct involving the left posterior frontal lobe, anterior aspect of the left parietal lobe, without hemorrhagic conversion. Punctate focus of acute ischemia within the left insula.  Subacute additional bilateral middle cerebral artery territory infarcts, less likely related to hypoperfusion/watershed.  Right temporal parietal encephalomalacia most consistent with remote right middle cerebral artery territory infarct.    MRA of the brain  12/28/2012   Poor flow related enhancement of the left M2 and M3 branches concerning for slow flow versus occlusion with mild irregularity of the residual left M2 vessels which may reflect atherosclerosis. No high-grade stenosis.    2D Echocardiogram  12/29/2012 EF 45-50% with no source of embolus.   Carotid Doppler  Bilateral: 1-39% ICA stenosis. Vertebral artery flow is antegrade.  Difficult to visualize distal ICA bilaterally, but no evidence of significant stenosis or occlusion is noted  CXR    EKG paroxismal A-fib  Therapy Recommendations no PT or OT  Physical Exam   Neurologic Examination:  Mental Status:  Alert, awake, oriented x 4, thought content appropriate. No dysarthria but mild dysphasia characterized by difficulty finding words out. Able to follow 3 step commands without difficulty.  Cranial Nerves:  II: Discs flat bilaterally; Visual fields grossly normal, pupils equal, round, reactive to light and accommodation  III,IV, VI: ptosis not present, extra-ocular motions intact bilaterally  V,VII: smile symmetric, facial light touch sensation normal bilaterally  VIII: hearing normal bilaterally  IX,X: gag reflex present  XI: bilateral shoulder shrug  XII: midline tongue extension without atrophy or fasciculations  Motor:  Right : Upper extremity  5/5 Left: Upper extremity 5/5  Lower extremity 5/5 Lower extremity 5/5  Tone and bulk:normal tone throughout; no atrophy noted  Sensory: Pinprick and light touch intact throughout, bilaterally  Deep Tendon Reflexes:  Right: Upper Extremity Left: Upper extremity  biceps (C-5 to C-6) 2/4 biceps (C-5 to C-6) 2/4  tricep (C7) 2/4 triceps (C7) 2/4  Brachioradialis (C6) 2/4 Brachioradialis (C6) 2/4  Lower Extremity Lower Extremity  quadriceps (L-2 to L-4) 2/4 quadriceps (L-2 to L-4) 2/4  Achilles (S1) 2/4 Achilles (S1) 2/4  Plantars:  Right: downgoing Left: downgoing  Cerebellar:  normal finger-to-nose, normal heel-to-shin test  Gait:  No ataxia.     ASSESSMENT Nina Carpenter is a 67 y.o. female presenting with mild expressive aphasia. Imaging confirms a multiple bilateral  infarcts. Infarcts felt to be  embolic secondary to recently diagnosed atrial fibrillation.  Work up underway. On aspirin 81 mg orally every day prior to admission - she had been advised to take xarelto, but she was not agreeable. Now on aspirin 325 mg orally every day for secondary stroke prevention.  Patient with resultant mild expressive aphasia.  Atrial Fibrillation w/ RVR  CHA2DS2-VASc Score for Atrial Fibrillation Stroke Risk = 6   Age in Years:  <65 (0), 73-74 (+1), ?49 (+28)    Sex:  Female (0 ), Female (+1)    Congestive Heart Failure History:  yes (+1 )   Hypertension History:  yes (+1 )   Stroke/TIA/Thromboembolism History:  yes (+2 )   Vascular Disease History:  yes (+1)   Diabetes Mellitus: yes (+1 )Hypertension Tachy brady syndrome, EP considering PPM Diabetes, HgbA1c 7.9, goal < 7.0 Hyperlipidemia, LDL 154, on no statin PTA, now on lipitor, goal LDL < 70 for diabetics Obesity, Body mass index is 34.05 kg/(m^2).   Hospital day # 2  TREATMENT/PLAN  Continue aspirin 325 mg 3 times a day daily for secondary stroke prevention. Recommend Xarelto for long-term. Will wait on decision for PPM prior to  starting. Given small size of strokes, no indication to wait 7 days prior to starting xarelto. In discussion with Dr. [name], he prefers waiting for 2 day after PPM placement.  Speech therapy eval   Dr. Pearlean Brownie discussed diagnosis, prognosis,  treatment options and plan of care with pt, husband and Dr. [name].    Annie Main, MSN, RN, ANVP-BC, ANP-BC, [facility] Pager: [phone] 12/30/2012 6:56 PM  I have personally obtained a history, examined the patient, evaluated imaging results, and formulated the assessment and plan of care. I agree with the above. Delia Heady, MD

## 2012-12-30 NOTE — Consult Note (Addendum)
Tachybrady syndrome with doc HR>160 and Pauses >6.5  Sec Also with recent stroke  Pt needs pacing for tachybrady with posttermination pauses which will require av nodal blocking agents with deferral of rhythm control strategies for now Symptoms attributable to AF could incl fatigue and dizziness    The benefits and risks were reviewed including but not limited to death,  perforation, infection, lead dislodgement and device malfunction.  The patient understands agrees and is willing to proceed.  She also needs a sleep study as outpt as she likely has sleep apnea

## 2012-12-30 NOTE — Care Management Note (Addendum)
    Page 1 of 1   01/01/2013     1:26:24 PM   CARE MANAGEMENT NOTE 01/01/2013  Patient:  Nina Carpenter, Nina Carpenter   Account Number:  [id number]  Date Initiated:  12/30/2012  Documentation initiated by:  Junius Creamer  Subjective/Objective Assessment:   adm w stroke, at fib     Action/Plan:   lives w husband, pcp dr Karleen [place]   Anticipated DC Date:     Anticipated DC Plan:        DC Planning Services  CM consult      Choice offered to / List presented to:             Status of service:  Completed, signed off Medicare Important Message given?   (If response is "NO", the following Medicare IM given date fields will be blank) Date Medicare IM given:   Date Additional Medicare IM given:    Discharge Disposition:  HOME/SELF CARE  Per UR Regulation:  Reviewed for med. necessity/level of care/duration of stay  If discussed at Long Length of Stay Meetings, dates discussed:    Comments:  01-01-13 1312 Tomi Bamberger, RN,BSN [phone] CM did provide pt with a 30 day fre card for eliquis. Pt uses [facility] [employer] in [place]. Co pay will be given to pt before d/c.

## 2012-12-30 NOTE — Evaluation (Signed)
Occupational Therapy Evaluation Patient Details Name: Nina Carpenter MRN: [id number] DOB: [birth year] Today's Date: 12/30/2012 Time: 4098-1191 OT Time Calculation (min): 43 min  OT Assessment / Plan / Recommendation History of present illness Nina Carpenter is a 67 y.o. female w/ PMHx s/f PAF (refused anticoagulation earlier this year), DM2, HTN, HLD, obesity and GERD/hiatal hernia who was admitted to [facility] for CVA.   Clinical Impression   Pt admitted with above.  She appears to be at baseline level of functioning per pt and her spouse.  He reports that she is a "little more on" than usual.  She was able to divide and alternate attention, perform moderately complex math, and memory appears intact.  Spoke with pt and spouse about possible behavioral issues - that she may be a bit more disinhibited than normal, or may become more easily distracted.  Instructed them to notify MD at follow up if these issues persist, or interfere with her daily activities.   No further acute OT needs identified.     OT Assessment  Patient does not need any further OT services    Follow Up Recommendations  No OT follow up    Barriers to Discharge      Equipment Recommendations  None recommended by OT    Recommendations for Other Services    Frequency       Precautions / Restrictions Precautions Precautions: None   Pertinent Vitals/Pain     ADL  Eating/Feeding: Independent Where Assessed - Eating/Feeding: Edge of bed Grooming: Wash/dry hands;Wash/dry face;Brushing hair;Independent Where Assessed - Grooming: Unsupported standing Upper Body Bathing: Set up Where Assessed - Upper Body Bathing: Supported sitting Lower Body Bathing: Set up Where Assessed - Lower Body Bathing: Unsupported sit to stand Upper Body Dressing: Set up Where Assessed - Upper Body Dressing: Unsupported sitting Lower Body Dressing: Set up Where Assessed - Lower Body Dressing: Unsupported sit to stand Toilet  Transfer: Independent Toilet Transfer Method: Sit to stand;Stand pivot Acupuncturist: Comfort height toilet Toileting - Clothing Manipulation and Hygiene: Independent Where Assessed - Toileting Clothing Manipulation and Hygiene: Standing Transfers/Ambulation Related to ADLs: Independent ADL Comments: Pt is able to perform BADLs independently.  Pt is very high energy and spouse reports this is pt's normal behaviors, but that it may be exacerbated a bit.  Pt able to recall all MDs, details of conversations, and precautions for her pacemaker.  Pt. and spouse were instructed in potential deficits that they may see due to her CVA - attentional deficits, behavioral deficits, etc.     OT Diagnosis:    OT Problem List:   OT Treatment Interventions:     OT Goals(Current goals can be found in the care plan section)    Visit Information  Last OT Received On: 12/30/12 Assistance Needed: +1 History of Present Illness: Nina Carpenter is a 67 y.o. female w/ PMHx s/f PAF (refused anticoagulation earlier this year), DM2, HTN, HLD, obesity and GERD/hiatal hernia who was admitted to [facility] for CVA.       Prior Functioning     Home Living Family/patient expects to be discharged to:: Private residence Living Arrangements: Spouse/significant other Available Help at Discharge: Family;Available 24 hours/day Type of Home: House Home Access: Stairs to enter [employer] of Steps: 5 Entrance Stairs-Rails: Right;Left Home Layout: Two level;Bed/bath upstairs Alternate Level Stairs-Number of Steps: flight Alternate Level Stairs-Rails: Right Home Equipment: None Prior Function Level of Independence: Independent Comments: Patient works as Interior and spatial designer.  Family  reports her as being very high energy Communication Communication: Expressive difficulties Dominant Hand: Right         Vision/Perception Vision - History Baseline Vision: Wears glasses all the  time Patient Visual Report: No change from baseline Vision - Assessment Eye Alignment: Within Functional Limits Vision Assessment: Vision tested Ocular Range of Motion: Within Functional Limits Tracking/Visual Pursuits: Able to track stimulus in all quads without difficulty Visual Fields: No apparent deficits Additional Comments: Pt able to read,but language deficits appear to be cause of errors - does not appear to be due to visual deficit Perception Perception: Within Functional Limits Praxis Praxis: Intact   Cognition  Cognition Arousal/Alertness: Awake/alert Behavior During Therapy: Restless Overall Cognitive Status: Within Functional Limits for tasks assessed    Extremity/Trunk Assessment Upper Extremity Assessment Upper Extremity Assessment: Overall WFL for tasks assessed Lower Extremity Assessment Lower Extremity Assessment: Defer to PT evaluation Cervical / Trunk Assessment Cervical / Trunk Assessment: Normal     Mobility Bed Mobility Bed Mobility: Supine to Sit;Sitting - Scoot to Edge of Bed Supine to Sit: 7: Independent Sitting - Scoot to [employer] of Bed: 7: Independent Transfers Transfers: Sit to Stand;Stand to Sit Sit to Stand: 7: Independent Stand to Sit: 7: Independent     Exercise     Balance     End of Session OT - End of Session Activity Tolerance: Patient tolerated treatment well Patient left: in bed;with call bell/phone within reach;with family/visitor present Nurse Communication: Mobility status  GO     Nina Carpenter 12/30/2012, 2:02 PM

## 2012-12-30 NOTE — CV Procedure (Signed)
Preop DX:: atrial fib with RVR and post termination pauses >6 sec Post op DX:: same  Procedure  dual pacemaker implantation  After routine prep and drape, lidocaine was infiltrated in the prepectoral subclavicular region on the left side an incision was made and carried down to later the prepectoral fascia using electrocautery and sharp dissection a pocket was formed similarly. Hemostasis was obtained.  After this, we turned our attention to gaining accessm to the extrathoracic,left subclavian vein. This was accomplished without difficulty and without the aspiration of air or puncture of the artery. 2 separate venipunctures were accomplished; guidewires were placed and retained and sequentially 7 French sheath through which were  passed an Medtronic 5076 ventricular lead serial [id number] and an Medtronic  atrial lead serial number [id number] .  The ventricular lead was manipulated to the right ventricular apex with a bipolar R wave was 8.1, the pacing impedance was 1200, the threshold was 0.8 @ 0.5 msec  Current at threshold was   0.7  Ma and the current of injury was  brisk.  The right atrial lead was manipulated to the right atrial appendage with a bipolar P-wave  3, the pacing impedance was 913, the threshold 1.7@ 0.5 msec   Current at threshold was 2.0  Ma and the current of injury was brisk.  The ventricular lead was marked with a tie prior to the insertion of the atrial lead. The leads were affixed to the prepectoral fascia and attached to a  Medtronic  pulse generator serial number [id number].  Hemostasis was obtained. The pocket was copiously irrigated with antibiotic containing saline solution. The leads and the pulse generator were placed in the pocket and affixed to the prepectoral fascia. The wound was then closed in 3 layers in the normal fashion.  The wound was washed dried   and a dermabond dressing was applied .    Needle  Count, sponge counts and instrument counts were  correct at the end of the procedure .   The patient tolerated the procedure without apparent complication.  Gerlene Burdock.D.

## 2012-12-30 NOTE — Progress Notes (Signed)
[facility] Progress Note [place] TEAM 1 - Stepdown/ICU TEAM   Nina Carpenter JYN:[id number] DOB: 1945-09-13 DOA: 12/28/2012 PCP: [name], MD  Admit HPI / Brief Narrative: 67 y.o. female with a medical history of atrial fibrillation, diabetes, hypertension, GERD who presented with an episode of fall. She did not hit her head. When her husband came home he found that she was not coherent in her speech and had slurring of her words. She has history of atrial fibrillation and had been on aspirin. She was recommended anticoagulation in the past which she declined.   As per the husband while in the ED she had episodes of clear speech and thought interspersed with confusion and slurred speech. In the ER an EKG revealed atrial fibrillation with RVR, (HR 150).  Noncontrast head CT showed R parietal and R temporal encephalomalacia c/w prior insult/infarct. Follow-up MRI confirmed an acute L MCA infarct involving the L posterior frontal lobe and anterior left parietal lobe. Remote R MCA infarct. No hemorrage  HPI/Subjective: Pt reports that she feels "almost back to normal" today.  She denies f/c, sob, cp, n/v, or abdom pain.  Assessment/Plan:  Thromboembolic CVA - acute L MCA infarct involving the L posterior frontal lobe and anterior left parietal lobe Neurology recommended 7 days of ASA alone before starting anticoagulation to minimize risk of hemorrhagic conversion - plan is to begin Xarelto after 7 days have passed - Neuro following   Afib w/ RVR Diagnosed Sept 2014 - pt refused anticoag previously - has now agreed to Xarelto as noted above   Tachy-brady syndrome with post-termination pauses EP evaluation for possible PPM ongoing - pt agreeable to PPM   HTN BP control erratic - challending in setting of brady and recent CVA - avoid overcorrection at this time - no change in tx today   DM CBG not at goal - A1c 7.9 - does not appear to be on meds as oupt - initiate low dose  lantus for now - will likely need to begin oral agent at time of d/c with close outpt f/u   HLD LDL 154 - pt needs tx - LFTs normal - begin Lipitor and follow up as oupt  Obesity  Code Status: FULL Family Communication: discussed care at length w/ pt and husband at bedside  Disposition Plan: SDU awaiting PPM   Consultants: Cardiology EP  Procedures: none  Antibiotics: none  DVT prophylaxis: lovenox  Objective: Blood pressure 178/75, pulse 123, temperature 97.8 F (36.6 C), temperature source Oral, resp. rate 20, height 5\' 3"  (1.6 m), weight 87.176 kg (192 lb 3 oz), SpO2 100.00%.  Intake/Output Summary (Last 24 hours) at 12/30/12 1040 Last data filed at 12/30/12 0800  Gross per 24 hour  Intake 1192.5 ml  Output    800 ml  Net  392.5 ml   Exam: General: No acute respiratory distress Lungs: Clear to auscultation bilaterally without wheezes or crackles Cardiovascular: Regular rate and rhythm without murmur gallop or rub normal S1 and S2 Abdomen: Nontender, nondistended, soft, bowel sounds positive, no rebound, no ascites, no appreciable mass Extremities: No significant cyanosis, clubbing, or edema bilateral lower extremities  Data Reviewed: Basic Metabolic Panel:  Recent Labs Lab 12/28/12 1803 12/28/12 1808 12/29/12 0705  NA 138 141 140  K 3.2* 3.1* 3.8  CL 100 106 104  CO2 20  --  23  GLUCOSE 143* 149* 175*  BUN 16 17 11   CREATININE 0.78 1.00 0.72  CALCIUM 9.2  --  8.7  MG  --   --  2.0   Liver Function Tests:  Recent Labs Lab 12/28/12 1803 12/29/12 0705  AST 20 18  ALT 22 19  ALKPHOS 98 80  BILITOT 0.5 0.7  PROT 7.5 6.5  ALBUMIN 4.4 3.7   CBC:  Recent Labs Lab 12/28/12 1803 12/28/12 1808 12/29/12 0705  WBC 7.5  --  8.8  NEUTROABS 5.1  --  6.6  HGB 15.0 16.0* 13.9  HCT 43.4 47.0* 40.2  MCV 90.0  --  89.7  PLT 213  --  208   Cardiac Enzymes:  Recent Labs Lab 12/28/12 1805 12/29/12 0705  TROPONINI <0.30 <0.30   BNP (last 3  results)  Recent Labs  10/15/12 1155  PROBNP 273.0*   CBG:  Recent Labs Lab 12/28/12 1829 12/29/12 0723 12/29/12 1135 12/29/12 1622 12/29/12 2130  GLUCAP 139* 171* 176* 167* 161*    Recent Results (from the past 240 hour(s))  MRSA PCR SCREENING     Status: None   Collection Time    12/28/12 10:46 PM      Result Value Range Status   MRSA by PCR NEGATIVE  NEGATIVE Final   Comment:            The GeneXpert MRSA Assay (FDA     approved for NASAL specimens     only), is one component of a     comprehensive MRSA colonization     surveillance program. It is not     intended to diagnose MRSA     infection nor to guide or     monitor treatment for     MRSA infections.     Studies:  Recent x-ray studies have been reviewed in detail by the Attending Physician  Scheduled Meds:  Scheduled Meds: . aspirin EC  325 mg Oral Daily  . enoxaparin (LOVENOX) injection  40 mg Subcutaneous Q24H  . metoprolol tartrate  50 mg Oral QID  . pantoprazole  40 mg Oral Daily    Time spent on care of this patient: 35 mins   [facility] T  [facility] Office  [phone] Pager - Text Page per Loretha Stapler as per below:  On-Call/Text Page:      [email or web address]      password [email or web address]  If 7PM-7AM, please contact night-coverage [email or web address] Password [email or web address] 12/30/2012, 10:40 AM   LOS: 2 days

## 2012-12-31 ENCOUNTER — Encounter (HOSPITAL_COMMUNITY): Payer: Self-pay | Admitting: *Deleted

## 2012-12-31 ENCOUNTER — Inpatient Hospital Stay (HOSPITAL_COMMUNITY): Payer: Medicare Other

## 2012-12-31 DIAGNOSIS — I6340 Cerebral infarction due to embolism of unspecified cerebral artery: Principal | ICD-10-CM

## 2012-12-31 DIAGNOSIS — E119 Type 2 diabetes mellitus without complications: Secondary | ICD-10-CM

## 2012-12-31 LAB — GLUCOSE, CAPILLARY
Glucose-Capillary: 151 mg/dL — ABNORMAL HIGH (ref 70–99)
Glucose-Capillary: 147 mg/dL — ABNORMAL HIGH (ref 70–99)

## 2012-12-31 MED ORDER — DILTIAZEM HCL ER COATED BEADS 180 MG PO CP24
180.0000 mg | ORAL_CAPSULE | Freq: Every day | ORAL | Status: DC
  Administered 2012-12-31: 11:00:00 180 mg via ORAL
  Filled 2012-12-31 (×3): qty 1

## 2012-12-31 MED ORDER — LOSARTAN POTASSIUM 50 MG PO TABS
100.0000 mg | ORAL_TABLET | Freq: Every day | ORAL | Status: DC
  Administered 2012-12-31 – 2013-01-01 (×2): 100 mg via ORAL
  Filled 2012-12-31 (×2): qty 2

## 2012-12-31 MED ORDER — METOPROLOL SUCCINATE ER 100 MG PO TB24
100.0000 mg | ORAL_TABLET | Freq: Every day | ORAL | Status: DC
  Filled 2012-12-31: qty 1

## 2012-12-31 MED ORDER — ALUM & MAG HYDROXIDE-SIMETH 200-200-20 MG/5ML PO SUSP: 30.0000 mL | ORAL | Status: DC | PRN

## 2012-12-31 MED ORDER — ATORVASTATIN CALCIUM 20 MG PO TABS
20.0000 mg | ORAL_TABLET | Freq: Every day | ORAL | Status: DC
  Administered 2012-12-31: 20 mg via ORAL
  Filled 2012-12-31 (×3): qty 1

## 2012-12-31 MED ORDER — METOPROLOL SUCCINATE ER 100 MG PO TB24
100.0000 mg | ORAL_TABLET | Freq: Every day | ORAL | Status: DC
  Administered 2012-12-31 – 2013-01-01 (×2): 100 mg via ORAL
  Filled 2012-12-31 (×3): qty 1

## 2012-12-31 MED ORDER — LIVING WELL WITH DIABETES BOOK
Freq: Once | Status: AC
  Administered 2012-12-31: 09:00:00
  Filled 2012-12-31: qty 1

## 2012-12-31 MED ORDER — DILTIAZEM HCL ER COATED BEADS 180 MG PO CP24: 180.0000 mg | ORAL_CAPSULE | Freq: Every day | ORAL | Status: DC

## 2012-12-31 NOTE — Evaluation (Signed)
Speech Language Pathology Evaluation Patient Details Name: Nina Carpenter MRN: [id number] DOB: 27-Dec-1945 Today's Date: 12/31/2012 Time: 8295-6213 SLP Time Calculation (min): 22 min  Problem List:  Patient Active Problem List   Diagnosis Date Noted  . Atrial fibrillation 12/29/2012  . Tachy-brady syndrome 12/29/2012  . Stroke 12/28/2012  . CVA (cerebral infarction) 12/28/2012  . New onset atrial fibrillation 10/14/2012  . Hx of rheumatic fever 08/07/2011  . TOBACCO USE 03/17/2010  . NEURALGIA, TRIGEMINAL 01/26/2010  . FATIGUE 01/26/2010  . DM Type 2 05/04/2008  . HYPERLIPIDEMIA 05/04/2008  . HYPERTENSION 05/04/2008  . ALLERGIC RHINITIS 05/04/2008  . ASTHMA 05/04/2008  . GERD 05/04/2008  . POSTMENOPAUSAL STATUS 05/04/2008   Past Medical History:  Past Medical History  Diagnosis Date  . Allergy   . Asthma   . Type 2 diabetes mellitus   . GERD (gastroesophageal reflux disease)   . Hyperlipidemia   . Hypertension   . Kidney stones   . Rheumatic fever   . TMJ arthritis   . Heart murmur   . Ectopic pregnancy   . Hx of rheumatic fever 08/07/2011  . PAF (paroxysmal atrial fibrillation)     Refused Xarelto 09/2012 due to risk of bleeding  . Obesity    Past Surgical History:  Past Surgical History  Procedure Laterality Date  . Cholecystectomy    . Tubal ligation      ectopic preganacy   . Destruction trigeminal nerve via neurolytic agent  NOv @013     Gamma Knife   . Pacemaker insertion  12-30-12    MDT dual chamber pacemaker implanted by Dr [name] for tachy-brady syndrome   HPI:  67 y.o. female with Past medical history of atrial fibrillation, diabetes, hypertension, GERD.  The patient presents with an episode of fall.  Pt. had a fall at around the closet she did not hit her head or neck does not have any external or bruise. After that at around 3:30 when the husband came home he found that she was not coherent in her speech and had slurring of her worse as well.  As per  the husband while in the ED she had episodes of clear speech and thought interspersed with confusion and slurred speech.  MRI revealed acute left middle cerebral artery territory infarct involving the left posterior frontal lobe, anterior aspect of the left parietal lobe, without hemorrhagic conversion. Punctate focus of acute ischemia within the left insula.   Assessment / Plan / Recommendation Clinical Impression  Pt. demonstrated functional cognitive abilities (verbal and performance).  She did need mod-max cues recalling specific pieces of information learned from OT yesterday.  Expressed her thoughts in conversation with several episodes of word finging difficulty which she recognized and self corrected.  No dysarthria noted.  SLP educated on word finding techniques to implement if needed.  Discussed strategies to facilitate working and prospective memory which pt. Stated using a appointment/log book.  Reiterated OT's education regarding possibility of increased difficulty concentrating or disinhibition following CVA.  Today she appears calm and appropriate.  No further ST recommended.     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)  Cognition  Overall Cognitive Status: Within Functional Limits for tasks assessed Arousal/Alertness: Awake/alert Orientation Level: Oriented X4 Attention: Selective Selective Attention: Appears intact Memory: Appears intact Awareness: Appears intact Problem Solving: Appears intact Executive Function:  (appeared WFL) Safety/Judgment: Appears intact    Comprehension  Auditory Comprehension Overall Auditory Comprehension: Appears  within functional limits for tasks assessed Visual Recognition/Discrimination Discrimination: Not tested Reading Comprehension Reading Status: Within funtional limits    Expression Expression Primary Mode of Expression: Verbal Verbal Expression Overall Verbal Expression: Appears within functional limits for tasks assessed Initiation: No impairment Level of Generative/Spontaneous Verbalization: Conversation Repetition: No impairment Naming: No impairment Pragmatics: No impairment Written Expression Dominant Hand: Right Written Expression: Not tested   Oral / Motor Oral Motor/Sensory Function Overall Oral Motor/Sensory Function: Appears within functional limits for tasks assessed Motor Speech Overall Motor Speech: Appears within functional limits for tasks assessed Respiration: Within functional limits Phonation: Normal Resonance: Within functional limits Articulation: Within functional limitis Intelligibility: Intelligible Motor Planning: Witnin functional limits   GO     Breck Coons [employer].Ed [employer] [phone]  12/31/2012

## 2012-12-31 NOTE — Progress Notes (Signed)
[facility] Progress Note [place] TEAM 1 - Stepdown/ICU TEAM   CATIA TODOROV ZOX:[id number] DOB: 05/12/1945 DOA: 12/28/2012 PCP: [name], MD  Admit HPI / Brief Narrative: 67 y.o. female with a medical history of atrial fibrillation, diabetes, hypertension, GERD who presented with an episode of fall. She did not hit her head. When her husband came home he found that she was not coherent in her speech and had slurring of her words. She has history of atrial fibrillation and had been on aspirin. She was recommended anticoagulation in the past which she declined.   As per the husband while in the ED she had episodes of clear speech and thought interspersed with confusion and slurred speech. In the ER an EKG revealed atrial fibrillation with RVR, (HR 150).  Noncontrast head CT showed R parietal and R temporal encephalomalacia c/w prior insult/infarct. Follow-up MRI confirmed an acute L MCA infarct involving the L posterior frontal lobe and anterior left parietal lobe. Remote R MCA infarct. No hemorrage  HPI/Subjective: Pt evaluated - has no significant complaints- discussed changed in BP meds and plan for anticoagulation  Assessment/Plan:  Thromboembolic CVA - acute L MCA infarct involving the L posterior frontal lobe and anterior left parietal lobe Neurology recommended 7 days of ASA alone before starting anticoagulation to minimize risk of hemorrhagic conversion - plan is to begin Apixaban in 48hrs - Neuro following   Afib w/ RVR Diagnosed Sept 2014 - pt refused anticoag previously - has now agreed to anticoagulation  Tachy-brady syndrome with post-termination pauses EP evaluation for possible PPM ongoing - stable after pacemaker   HTN BP control erratic - challending in setting of brady and recent CVA - cardiology has adjusted anti-hypertensives- follow BP  DM CBG not at goal - A1c 7.9 - does not appear to be on meds as oupt - stable on low dose lantus - will likely  need to begin oral agent at time of d/c with close outpt f/u   HLD LDL 154 - pt needs tx - LFTs normal - begin Lipitor and follow up as oupt  Obesity  Code Status: FULL Family Communication: discussed care at length w/ pt and husband at bedside  Disposition Plan: tele- home tomorrow if BP stable with med adjustments  Consultants: Cardiology EP  Procedures: none  Antibiotics: none  DVT prophylaxis: lovenox  Objective: Blood pressure 146/76, pulse 66, temperature 97.7 F (36.5 C), temperature source Oral, resp. rate 20, height 5\' 3"  (1.6 m), weight 85.8 kg (189 lb 2.5 oz), SpO2 96.00%.  Intake/Output Summary (Last 24 hours) at 12/31/12 2116 Last data filed at 12/31/12 1339  Gross per 24 hour  Intake    480 ml  Output      0 ml  Net    480 ml   Exam: General: No acute respiratory distress Lungs: Clear to auscultation bilaterally without wheezes or crackles Cardiovascular: Regular rate and rhythm without murmur gallop or rub normal S1 and S2 Abdomen: Nontender, nondistended, soft, bowel sounds positive, no rebound, no ascites, no appreciable mass Extremities: No significant cyanosis, clubbing, or edema bilateral lower extremities  Data Reviewed: Basic Metabolic Panel:  Recent Labs Lab 12/28/12 1803 12/28/12 1808 12/29/12 0705  NA 138 141 140  K 3.2* 3.1* 3.8  CL 100 106 104  CO2 20  --  23  GLUCOSE 143* 149* 175*  BUN 16 17 11   CREATININE 0.78 1.00 0.72  CALCIUM 9.2  --  8.7  MG  --   --  2.0   Liver Function Tests:  Recent Labs Lab 12/28/12 1803 12/29/12 0705  AST 20 18  ALT 22 19  ALKPHOS 98 80  BILITOT 0.5 0.7  PROT 7.5 6.5  ALBUMIN 4.4 3.7   CBC:  Recent Labs Lab 12/28/12 1803 12/28/12 1808 12/29/12 0705  WBC 7.5  --  8.8  NEUTROABS 5.1  --  6.6  HGB 15.0 16.0* 13.9  HCT 43.4 47.0* 40.2  MCV 90.0  --  89.7  PLT 213  --  208   Cardiac Enzymes:  Recent Labs Lab 12/28/12 1805 12/29/12 0705  TROPONINI <0.30 <0.30   BNP (last 3  results)  Recent Labs  10/15/12 1155  PROBNP 273.0*   CBG:  Recent Labs Lab 12/30/12 2148 12/31/12 0824 12/31/12 1252 12/31/12 1659 12/31/12 2002  GLUCAP 135* 151* 142* 163* 147*    Recent Results (from the past 240 hour(s))  MRSA PCR SCREENING     Status: None   Collection Time    12/28/12 10:46 PM      Result Value Range Status   MRSA by PCR NEGATIVE  NEGATIVE Final   Comment:            The GeneXpert MRSA Assay (FDA     approved for NASAL specimens     only), is one component of a     comprehensive MRSA colonization     surveillance program. It is not     intended to diagnose MRSA     infection nor to guide or     monitor treatment for     MRSA infections.     Studies:  Recent x-ray studies have been reviewed in detail by the Attending Physician  Scheduled Meds:  Scheduled Meds: . aspirin EC  325 mg Oral Daily  . atorvastatin  20 mg Oral q1800  . diltiazem  180 mg Oral Daily  . enoxaparin (LOVENOX) injection  40 mg Subcutaneous Q24H  . insulin aspart  0-9 Units Subcutaneous TID WC  . insulin glargine  10 Units Subcutaneous QHS  . losartan  100 mg Oral Daily  . metoprolol succinate  100 mg Oral Daily  . pantoprazole  40 mg Oral Daily    Time spent on care of this patient: 35 mins   Calvert Cantor, MD  [facility] Office  [phone] Pager - Text Page per Loretha Stapler as per below:  On-Call/Text Page:      [email or web address]      password [email or web address]  If 7PM-7AM, please contact night-coverage [email or web address] Password [email or web address] 12/31/2012, 9:16 PM   LOS: 3 days

## 2012-12-31 NOTE — Progress Notes (Signed)
Stroke Team Progress Note  HISTORY 67 y.o. female with a past medical history significant for HTN, DM, hyperlipidemia, atrial fibrillation, asthma, s/p gamma knife surgery for left trigeminal neuralgia, brought to [facility] ED by ambulance 12/28/2012 as a code stroke due to acute onset of difficulty speaking. Although her husband said that she talked to her on the phone around 330 pm and she sounded normal, the last known well is not entirely clear as she was home alone the whole day and she said that she had a fall. Husband said that around 330 or 4 pm he noticed that his wife was having difficulty finding words out and was slurring her words. They report no focal weakness, face droopiness, numbness or tingling, unsteadiness, confusion, or visual disturbances.No chest pain, shortness or breath, or palpitations. Stated that she never had similar symptoms before. Patient was not a TPA candidate secondary to resolution of symptoms and unclear last known well. She was admitted for further evaluation and treatment.  SUBJECTIVE Husband at the bedside. Patient is talkative, sitting up in the chair, feels good post PPM.  OBJECTIVE Most recent Vital Signs: Filed Vitals:   12/30/12 2357 12/31/12 0539 12/31/12 0807 12/31/12 0823  BP: 182/78 122/63 173/76 175/77  Pulse: 73 65 76   Temp: 98.3 F (36.8 C) 98.3 F (36.8 C) 98.5 F (36.9 C)   TempSrc: Axillary Oral Oral   Resp: 18 20 18    Height:      Weight:  85.8 kg (189 lb 2.5 oz)    SpO2: 97% 97% 97%    CBG (last 3)   Recent Labs  12/30/12 1738 12/30/12 2148 12/31/12 0824  GLUCAP 126* 135* 151*    IV Fluid Intake:   . sodium chloride Stopped (12/29/12 1242)    MEDICATIONS  . aspirin EC  325 mg Oral Daily  . atorvastatin  20 mg Oral q1800  . diltiazem  180 mg Oral Daily  . enoxaparin (LOVENOX) injection  40 mg Subcutaneous Q24H  . insulin aspart  0-9 Units Subcutaneous TID WC  . insulin glargine  10 Units Subcutaneous QHS  . living well with  diabetes book   Does not apply Once  . losartan  100 mg Oral Daily  . metoprolol succinate  100 mg Oral Daily  . pantoprazole  40 mg Oral Daily   PRN:  acetaminophen, hydrALAZINE, ondansetron (ZOFRAN) IV  Diet:  Carb Control thin lqiuids Activity:   Up with assistance DVT Prophylaxis:  Lovenox 40 mg sq daily   CLINICALLY SIGNIFICANT STUDIES Basic Metabolic Panel:   Recent Labs Lab 12/28/12 1803 12/28/12 1808 12/29/12 0705  NA 138 141 140  K 3.2* 3.1* 3.8  CL 100 106 104  CO2 20  --  23  GLUCOSE 143* 149* 175*  BUN 16 17 11   CREATININE 0.78 1.00 0.72  CALCIUM 9.2  --  8.7  MG  --   --  2.0   Liver Function Tests:   Recent Labs Lab 12/28/12 1803 12/29/12 0705  AST 20 18  ALT 22 19  ALKPHOS 98 80  BILITOT 0.5 0.7  PROT 7.5 6.5  ALBUMIN 4.4 3.7   CBC:   Recent Labs Lab 12/28/12 1803 12/28/12 1808 12/29/12 0705  WBC 7.5  --  8.8  NEUTROABS 5.1  --  6.6  HGB 15.0 16.0* 13.9  HCT 43.4 47.0* 40.2  MCV 90.0  --  89.7  PLT 213  --  208   Coagulation:   Recent Labs Lab 12/28/12  1803  LABPROT 12.2  INR 0.92   Cardiac Enzymes:   Recent Labs Lab 12/28/12 1805 12/29/12 0705  TROPONINI <0.30 <0.30   Urinalysis:   Recent Labs Lab 12/28/12 1836  COLORURINE YELLOW  LABSPEC 1.004*  PHURINE 6.0  GLUCOSEU NEGATIVE  HGBUR NEGATIVE  BILIRUBINUR NEGATIVE  KETONESUR NEGATIVE  PROTEINUR NEGATIVE  UROBILINOGEN 0.2  NITRITE NEGATIVE  LEUKOCYTESUR NEGATIVE   Lipid Panel    Component Value Date/Time   CHOL 227* 12/28/2012 2348   TRIG 114 12/28/2012 2348   HDL 50 12/28/2012 2348   CHOLHDL 4.5 12/28/2012 2348   VLDL 23 12/28/2012 2348   LDLCALC 154* 12/28/2012 2348   HgbA1C  Lab Results  Component Value Date   HGBA1C 7.9* 12/29/2012    Urine Drug Screen:     Component Value Date/Time   LABOPIA NONE DETECTED 12/28/2012 1836   COCAINSCRNUR NONE DETECTED 12/28/2012 1836   LABBENZ NONE DETECTED 12/28/2012 1836   AMPHETMU NONE DETECTED 12/28/2012 1836    THCU NONE DETECTED 12/28/2012 1836   LABBARB NONE DETECTED 12/28/2012 1836    Alcohol Level:   Recent Labs Lab 12/28/12 1803  ETH <11     CT of the brain  12/28/2012    Negative for acute hemorrhage.  Encephalomalacia involving the right parietal and right temporal cortex. Findings are consistent with a previous insult or infarct.  Scattered areas of white matter disease could represent chronic small vessel ischemic changes.     MRI of the brain  12/28/2012   Acute left middle cerebral artery territory infarct involving the left posterior frontal lobe, anterior aspect of the left parietal lobe, without hemorrhagic conversion. Punctate focus of acute ischemia within the left insula.  Subacute additional bilateral middle cerebral artery territory infarcts, less likely related to hypoperfusion/watershed.  Right temporal parietal encephalomalacia most consistent with remote right middle cerebral artery territory infarct.    MRA of the brain  12/28/2012   Poor flow related enhancement of the left M2 and M3 branches concerning for slow flow versus occlusion with mild irregularity of the residual left M2 vessels which may reflect atherosclerosis. No high-grade stenosis.    2D Echocardiogram  12/29/2012 EF 45-50% with no source of embolus.   Carotid Doppler  Bilateral: 1-39% ICA stenosis. Vertebral artery flow is antegrade.  Difficult to visualize distal ICA bilaterally, but no evidence of significant stenosis or occlusion is noted  CXR    EKG paroxismal A-fib  Therapy Recommendations no PT or OT  Physical Exam   Neurologic Examination:  Mental Status:  Alert, awake, oriented x 4, thought content appropriate. No dysarthria but mild dysphasia characterized by difficulty finding words out. Able to follow 3 step commands without difficulty.  Cranial Nerves:  II: Discs flat bilaterally; Visual fields grossly normal, pupils equal, round, reactive to light and accommodation  III,IV, VI: ptosis not  present, extra-ocular motions intact bilaterally  V,VII: smile symmetric, facial light touch sensation normal bilaterally  VIII: hearing normal bilaterally  IX,X: gag reflex present  XI: bilateral shoulder shrug  XII: midline tongue extension without atrophy or fasciculations  Motor:  Right : Upper extremity 5/5 Left: Upper extremity 5/5  Lower extremity 5/5 Lower extremity 5/5  Tone and bulk:normal tone throughout; no atrophy noted  Sensory: Pinprick and light touch intact throughout, bilaterally  Deep Tendon Reflexes:  Right: Upper Extremity Left: Upper extremity  biceps (C-5 to C-6) 2/4 biceps (C-5 to C-6) 2/4  tricep (C7) 2/4 triceps (C7) 2/4  Brachioradialis (C6) 2/4 Brachioradialis (  C6) 2/4  Lower Extremity Lower Extremity  quadriceps (L-2 to L-4) 2/4 quadriceps (L-2 to L-4) 2/4  Achilles (S1) 2/4 Achilles (S1) 2/4  Plantars:  Right: downgoing Left: downgoing  Cerebellar:  normal finger-to-nose, normal heel-to-shin test  Gait:  No ataxia.     ASSESSMENT Nina Carpenter is a 67 y.o. female presenting with mild expressive aphasia. Imaging confirms a multiple bilateral infarcts. Infarcts felt to be  embolic secondary to recently diagnosed atrial fibrillation.  On aspirin 81 mg orally every day prior to admission - she had been advised to take xarelto, but she was not agreeable. Now on aspirin 325 mg orally every day for secondary stroke prevention.  Patient with resultant mild expressive aphasia.  Atrial Fibrillation w/ RVR  CHA2DS2-VASc Score for Atrial Fibrillation Stroke Risk = 6   Age in Years:  89-74 (+1)   Sex:  Female (+1)    Congestive Heart Failure History:  no   Hypertension History:  yes (+1 )   Stroke/TIA/Thromboembolism History:  yes (+2 )   Vascular Disease History:  no   Diabetes Mellitus: yes (+1 )Hypertension Tachy brady syndrome, PPM placed 12/30/2012 Diabetes, HgbA1c 7.9, goal < 7.0 Hyperlipidemia, LDL 154, on no statin PTA, now on lipitor, goal  LDL < 70 for diabetics Obesity, Body mass index is 33.52 kg/(m^2).   Hospital day # 3  TREATMENT/PLAN  Continue aspirin 325 mg for now for for secondary stroke prevention. Agree with Dr. Odessa Fleming plans to start Apixaban in 48 hours.  No further stroke workup indicated. Patient has a 10-15% risk of having another stroke over the next year, the highest risk is within 2 weeks of the most recent stroke/TIA (risk of having a stroke following a stroke or TIA is the same). Ongoing risk factor control by Primary Care Physician Stroke Service will sign off. Please call should any needs arise. Follow up with Dr. Pearlean Brownie, Stroke Clinic, in 2 months.  Dr. Pearlean Brownie discussed diagnosis, prognosis,  treatment options and plan of care with pt and husband.  Annie Main, MSN, RN, ANVP-BC, ANP-BC, [facility] Pager: [phone] 12/31/2012 10:03 AM  I have personally obtained a history, examined the patient, evaluated imaging results, and formulated the assessment and plan of care. I agree with the above.  Delia Heady, MD

## 2012-12-31 NOTE — Progress Notes (Addendum)
SUBJECTIVE: The patient is doing well today.  At this time, she denies chest pain, shortness of breath, or any new concerns.  She is status post dual chamber pacemaker implant 12-30-2012 for tachy-brady syndrome and post termination pauses.    CURRENT MEDICATIONS: . aspirin EC  325 mg Oral Daily  . enoxaparin (LOVENOX) injection  40 mg Subcutaneous Q24H  . insulin aspart  0-9 Units Subcutaneous TID WC  . insulin glargine  10 Units Subcutaneous QHS  . metoprolol tartrate  100 mg Oral BID  . pantoprazole  40 mg Oral Daily  . simvastatin  40 mg Oral q1800   . sodium chloride Stopped (12/29/12 1242)    OBJECTIVE: Physical Exam: Filed Vitals:   12/30/12 1900 12/30/12 1943 12/30/12 2357 12/31/12 0539  BP: 153/85 167/62 182/78 122/63  Pulse:  75 73 65  Temp:  98.5 F (36.9 C) 98.3 F (36.8 C) 98.3 F (36.8 C)  TempSrc:  Oral Axillary Oral  Resp:  18 18 20   Height:      Weight:    189 lb 2.5 oz (85.8 kg)  SpO2:  98% 97% 97%    Intake/Output Summary (Last 24 hours) at 12/31/12 0612 Last data filed at 12/30/12 1900  Gross per 24 hour  Intake    540 ml  Output      0 ml  Net    540 ml    Telemetry reveals sinus rhythm with intermittent atrial pacing - ?occasional atrial undersensing - device interrogation pending this morning.   GEN- The patient is well appearing, alert and oriented x 3 today.   Head- normocephalic, atraumatic   Neck- supple, no JVP Lymph- no cervical lymphadenopathy Lungs- Clear to ausculation bilaterally, normal work of breathing Heart- Regular rate and rhythm, GI- soft, NT, ND, + BS Extremities- no clubbing, cyanosis, or edema Skin- no rash or lesion Psych- euthymic mood, full affect Neuro- strength and sensation are intact Pocket ok   LABS: Basic Metabolic Panel:  Recent Labs  16/10/96 1803 12/28/12 1808 12/29/12 0705  NA 138 141 140  K 3.2* 3.1* 3.8  CL 100 106 104  CO2 20  --  23  GLUCOSE 143* 149* 175*  BUN 16 17 11   CREATININE 0.78  1.00 0.72  CALCIUM 9.2  --  8.7  MG  --   --  2.0   Liver Function Tests:  Recent Labs  12/28/12 1803 12/29/12 0705  AST 20 18  ALT 22 19  ALKPHOS 98 80  BILITOT 0.5 0.7  PROT 7.5 6.5  ALBUMIN 4.4 3.7   CBC:  Recent Labs  12/28/12 1803 12/28/12 1808 12/29/12 0705  WBC 7.5  --  8.8  NEUTROABS 5.1  --  6.6  HGB 15.0 16.0* 13.9  HCT 43.4 47.0* 40.2  MCV 90.0  --  89.7  PLT 213  --  208   Cardiac Enzymes:  Recent Labs  12/28/12 1805 12/29/12 0705  TROPONINI <0.30 <0.30   Hemoglobin A1C:  Recent Labs  12/29/12  HGBA1C 7.9*   Fasting Lipid Panel:  Recent Labs  12/28/12 2348  CHOL 227*  HDL 50  LDLCALC 154*  TRIG 114  CHOLHDL 4.5   Thyroid Function Tests:  Recent Labs  12/30/12 1008  TSH 1.404    RADIOLOGY: CXR - official read pending, leads in stable position  ASSESSMENT AND PLAN:  Principal Problem:   CVA (cerebral infarction) Active Problems:   HYPERLIPIDEMIA   HYPERTENSION   Stroke   Atrial fibrillation  Tachy-brady syndrome CXR without pneumothorax and good lead position Device function normal  Wound care, arm mobility, restrictions reviewed with patient.  Routine follow up scheduled in our [place] office.   ARB resumed and metoprolol toprol>>succinate NOAc to begin in 48hrs  (to protect pocket) have discussed withfamily apixoban as opposed to Rivaroxaban  From my point of view cango home

## 2013-01-01 LAB — GLUCOSE, CAPILLARY
Glucose-Capillary: 145 mg/dL — ABNORMAL HIGH (ref 70–99)
Glucose-Capillary: 135 mg/dL — ABNORMAL HIGH (ref 70–99)

## 2013-01-01 MED ORDER — APIXABAN 5 MG PO TABS: 5.0000 mg | ORAL_TABLET | Freq: Two times a day (BID) | ORAL | Status: DC

## 2013-01-01 MED ORDER — DILTIAZEM HCL ER COATED BEADS 180 MG PO CP24: 180.0000 mg | ORAL_CAPSULE | Freq: Every day | ORAL | Status: DC

## 2013-01-01 MED ORDER — METOPROLOL SUCCINATE ER 100 MG PO TB24: 100.0000 mg | ORAL_TABLET | Freq: Every day | ORAL | Status: DC

## 2013-01-01 MED ORDER — ASPIRIN 325 MG PO TBEC: DELAYED_RELEASE_TABLET | ORAL | Status: DC

## 2013-01-01 MED ORDER — PRAVASTATIN SODIUM 80 MG PO TABS: 80.0000 mg | ORAL_TABLET | Freq: Every day | ORAL | Status: DC

## 2013-01-01 NOTE — Progress Notes (Signed)
D/c orders received;IV removed with gauze on, pt remains in stable condition, pt meds and instructions reviewed and given to pt; reviewed pacemaker site care, also reviewed stroke d/c instructions with pt and family; pt d/c to home

## 2013-01-01 NOTE — Discharge Summary (Signed)
DISCHARGE SUMMARY  Nina Carpenter  MR#: [id number]  DOB:October 13, 1945  Date of Admission: 12/28/2012 Date of Discharge: 01/01/2013  Attending Physician:Marnie Fazzino T  Patient's JYN:WGNFAOZ [name], MD  Consults:  Cardiology  EP Neurology/Stroke Team   Disposition: D/C home w/ husband   Follow-up Appts:     Follow-up Information   Follow up with [facility] On 01/09/2013. (At 10:00 AM for wound check)    Specialty:  Cardiology   Contact information:   [address], [address] [place] [address] [phone]      Follow up with Sherryl Manges, MD On 04/08/2013. (At 9:15 AM)    Specialty:  Cardiology   Contact information:   [address]   [place] [address] [phone]       Follow up with Gates Rigg, MD. Schedule an appointment as soon as possible for a visit in 2 months. (stroke clinic)    Specialties:  Neurology, Radiology   Contact information:   [address] [place] [address] [phone]       Follow up with [name], MD. Schedule an appointment as soon as possible for a visit in 3 days.   Specialty:  Family Medicine   Contact information:   [address] [place] [address] [place] [address] [phone]      Tests Needing Follow-up: Recheck of BP and CBG is suggested - she will likely need to begin oral DM meds soon  Discharge Diagnoses: Thromboembolic CVA - acute L MCA infarct involving the L posterior frontal lobe and anterior left parietal lobe  Afib w/ RVR  Tachy-brady syndrome with post-termination pauses  HTN  DM  HLD  Obesity   Initial presentation: 67 y.o. female with a medical history of atrial fibrillation, diabetes, hypertension, GERD who presented with an episode of fall. She did not hit her head. When her husband came home he found that she was not coherent in her speech and had slurring of her words. She has history of atrial  fibrillation and had been on aspirin. She was recommended anticoagulation in the past which she declined.  As per the husband while in the ED she had episodes of clear speech interspersed with confusion and slurred speech. In the ER an EKG revealed atrial fibrillation with RVR, (HR 150). Noncontrast head CT showed R parietal and R temporal encephalomalacia c/w prior insult/infarct. Follow-up MRI confirmed an acute L MCA infarct involving the L posterior frontal lobe and anterior left parietal lobe. Remote R MCA infarct. No hemorrage   Hospital Course:  Thromboembolic CVA - acute L MCA infarct involving the L posterior frontal lobe and anterior left parietal lobe  Neurology recommended 7 days of ASA alone before starting anticoagulation to minimize risk of hemorrhagic conversion - plan is to begin Apixaban 12/12 - Neuro agrees  Afib w/ RVR  Diagnosed Sept 2014 - pt refused anticoag previously - has now agreed to anticoagulation - rate controlled at time of d/c with adjustments made in rate controlling meds    Tachy-brady syndrome with post-termination pauses  EP evaluation was completed due to tachy/brady - pt underwent successful placement of a dual pacemaker 12/8  HTN  BP control erratic - challending in setting of brady and recent CVA - Cardiology adjusted anti-hypertensives- follow BP as outpt - stressed need for medication compliance to pt and husband and son   DM  A1c 7.9 - does not appear to be on meds as oupt -  stable on low dose lantus during hospital stay - will not yet begin oral agent at time of d/c due to addition of multiple other new meds - will need close outpt f/u w/ consideration to initiating oral med in next 4 weeks   HLD  LDL 154 - pt needs tx - LFTs normal - resumed previously prescribed home med   Obesity     Medication List    STOP taking these medications       aspirin 81 MG tablet  Replaced by:  aspirin 325 MG EC tablet      TAKE these medications        albuterol 108 (90 BASE) MCG/ACT inhaler  Commonly known as:  VENTOLIN HFA  inhale 2 puffs by mouth every 4 hours if needed for wheezing or shortness of breath     apixaban 5 MG Tabs tablet  Commonly known as:  ELIQUIS  Take 1 tablet (5 mg total) by mouth 2 (two) times daily.  Start taking on:  01/03/2013     aspirin 325 MG EC tablet  Take one tablet on 12/11 then stop taking     diltiazem 180 MG 24 hr capsule  Commonly known as:  CARDIZEM CD  Take 1 capsule (180 mg total) by mouth daily.     famotidine 10 MG tablet  Commonly known as:  PEPCID  Take 10 mg by mouth 2 (two) times daily as needed.     glucose blood test strip  Commonly known as:  ONE TOUCH ULTRA TEST  Pt checks blood sugar twice a day and as needed.250.00     losartan 100 MG tablet  Commonly known as:  COZAAR  take 1 tablet by mouth once daily     metoprolol succinate 100 MG 24 hr tablet  Commonly known as:  TOPROL-XL  Take 1 tablet (100 mg total) by mouth daily. Take with or immediately following a meal.     ONETOUCH DELICA LANCETS Misc  Checking blood sugar twice a day and as needed. 250.00     pravastatin 80 MG tablet  Commonly known as:  PRAVACHOL  Take 1 tablet (80 mg total) by mouth daily.       Day of Discharge BP 114/59  Pulse 68  Temp(Src) 98.5 F (36.9 C) (Oral)  Resp 18  Ht 5\' 3"  (1.6 m)  Wt 85.8 kg (189 lb 2.5 oz)  BMI 33.52 kg/m2  SpO2 98%  Physical Exam: General: No acute respiratory distress Lungs: Clear to auscultation bilaterally without wheezes or crackles Cardiovascular: Regular rate and rhythm without murmur gallop or rub normal S1 and S2 Abdomen: Nontender, nondistended, soft, bowel sounds positive, no rebound, no ascites, no appreciable mass Extremities: No significant cyanosis, clubbing, or edema bilateral lower extremities  Time spent in discharge (includes decision making & examination of pt): >30 minutes  01/01/2013, 12:55 PM   Lonia Blood, MD [facility] Office  [phone] Pager [phone]  On-Call/Text Page:      [email or web address]      password [facility]

## 2013-01-02 ENCOUNTER — Observation Stay (HOSPITAL_COMMUNITY)
Admission: EM | Admit: 2013-01-02 | Discharge: 2013-01-03 | Disposition: A | Discharge status: Home / Self Care | Payer: Medicare Other | Attending: Cardiology | Admitting: Cardiology

## 2013-01-02 ENCOUNTER — Encounter (HOSPITAL_COMMUNITY): Payer: Self-pay | Admitting: Emergency Medicine

## 2013-01-02 DIAGNOSIS — Z87442 Personal history of urinary calculi: Secondary | ICD-10-CM | POA: Diagnosis not present

## 2013-01-02 DIAGNOSIS — Z79899 Other long term (current) drug therapy: Secondary | ICD-10-CM | POA: Diagnosis not present

## 2013-01-02 DIAGNOSIS — K219 Gastro-esophageal reflux disease without esophagitis: Secondary | ICD-10-CM | POA: Diagnosis not present

## 2013-01-02 DIAGNOSIS — E669 Obesity, unspecified: Secondary | ICD-10-CM | POA: Insufficient documentation

## 2013-01-02 DIAGNOSIS — I4891 Unspecified atrial fibrillation: Principal | ICD-10-CM | POA: Diagnosis present

## 2013-01-02 DIAGNOSIS — J45909 Unspecified asthma, uncomplicated: Secondary | ICD-10-CM | POA: Insufficient documentation

## 2013-01-02 DIAGNOSIS — E119 Type 2 diabetes mellitus without complications: Secondary | ICD-10-CM | POA: Insufficient documentation

## 2013-01-02 DIAGNOSIS — Z87891 Personal history of nicotine dependence: Secondary | ICD-10-CM | POA: Insufficient documentation

## 2013-01-02 DIAGNOSIS — Z950 Presence of cardiac pacemaker: Secondary | ICD-10-CM | POA: Diagnosis not present

## 2013-01-02 DIAGNOSIS — Z8673 Personal history of transient ischemic attack (TIA), and cerebral infarction without residual deficits: Secondary | ICD-10-CM | POA: Insufficient documentation

## 2013-01-02 DIAGNOSIS — I10 Essential (primary) hypertension: Secondary | ICD-10-CM | POA: Diagnosis not present

## 2013-01-02 DIAGNOSIS — Z7982 Long term (current) use of aspirin: Secondary | ICD-10-CM | POA: Diagnosis not present

## 2013-01-02 DIAGNOSIS — M2669 Other specified disorders of temporomandibular joint: Secondary | ICD-10-CM | POA: Diagnosis not present

## 2013-01-02 DIAGNOSIS — Z880 Allergy status to penicillin: Secondary | ICD-10-CM | POA: Insufficient documentation

## 2013-01-02 DIAGNOSIS — R011 Cardiac murmur, unspecified: Secondary | ICD-10-CM | POA: Insufficient documentation

## 2013-01-02 DIAGNOSIS — E785 Hyperlipidemia, unspecified: Secondary | ICD-10-CM | POA: Insufficient documentation

## 2013-01-02 DIAGNOSIS — I00 Rheumatic fever without heart involvement: Secondary | ICD-10-CM | POA: Diagnosis not present

## 2013-01-02 LAB — COMPREHENSIVE METABOLIC PANEL
ALT: 34 U/L (ref 0–35)
AST: 22 U/L (ref 0–37)
CO2: 22 mEq/L (ref 19–32)
Calcium: 9.2 mg/dL (ref 8.4–10.5)
Chloride: 104 mEq/L (ref 96–112)
Creatinine, Ser: 1.1 mg/dL (ref 0.50–1.10)
GFR calc Af Amer: 59 mL/min — ABNORMAL LOW (ref >90)
GFR calc non Af Amer: 51 mL/min — ABNORMAL LOW (ref >90)
Glucose, Bld: 244 mg/dL — ABNORMAL HIGH (ref 70–99)
Total Bilirubin: 0.4 mg/dL (ref 0.3–1.2)

## 2013-01-02 LAB — CBC WITH DIFFERENTIAL/PLATELET
Basophils Absolute: 0 10*3/uL (ref 0.0–0.1)
Eosinophils Relative: 1 % (ref 0–5)
HCT: 44.5 % (ref 36.0–46.0)
Hemoglobin: 15.6 g/dL — ABNORMAL HIGH (ref 12.0–15.0)
Lymphocytes Relative: 30 % (ref 12–46)
Lymphs Abs: 2.8 10*3/uL (ref 0.7–4.0)
MCHC: 35.1 g/dL (ref 30.0–36.0)
MCV: 90.8 fL (ref 78.0–100.0)
Monocytes Absolute: 0.7 10*3/uL (ref 0.1–1.0)
Neutro Abs: 5.7 10*3/uL (ref 1.7–7.7)
Neutrophils Relative %: 61 % (ref 43–77)
Platelets: 188 10*3/uL (ref 150–400)
RBC: 4.9 MIL/uL (ref 3.87–5.11)
WBC: 9.4 10*3/uL (ref 4.0–10.5)

## 2013-01-02 MED ORDER — DILTIAZEM HCL 100 MG IV SOLR
5.0000 mg/h | INTRAVENOUS | Status: DC
  Administered 2013-01-02: 5 mg/h via INTRAVENOUS
  Filled 2013-01-02 (×3): qty 100

## 2013-01-02 MED ORDER — DILTIAZEM LOAD VIA INFUSION
20.0000 mg | Freq: Once | INTRAVENOUS | Status: AC
  Administered 2013-01-02: 20 mg via INTRAVENOUS
  Filled 2013-01-02: qty 20

## 2013-01-02 NOTE — ED Notes (Signed)
Patient currently in restroom.  

## 2013-01-02 NOTE — ED Notes (Signed)
Pt. reports rapid heart rate this evening ( 130's ) , denies chest pain or palpitations , no SOB , alert and oriented . Pt. stated pacemaker placement last Monday - her cardiologist is Dr. [name] .

## 2013-01-02 NOTE — H&P (Signed)
History and Physical  Patient ID: Nina Carpenter MRN: [id number], SOB: 10-20-1945 67 y.o. Date of Encounter: 01/02/2013, 10:42 PM  Primary Physician: [name], MD Primary Cardiologist: Dr. [name]  Chief Complaint: palpitations  HPI: 67 y.o. female w/ PMHx significant for DM2, HTN and recent hospitalization for stroke and afib with tachybrady requiring a pacer who presented to [facility] on 01/02/2013 with complaints of palpitions. She was discharged yesterday after a 5 day hospitalization for stroke and tachybrady syndrome requiring a pacer. Seen by cardiology and neurology. During hospitalization, she spontaneously converted to sinus after being on dilt gtt.   She reports last night feeling the best in a while. And today, she also has remained asymptomatic. However, she noted that today while checking her pulse (bloodpressure cuff and sat monitor), her rate was in the 130s. Denies missing any meds. No excess caffeine or ETOH. No chest pain, SOB, DOE, syncope, presyncope, PND, orthopnea. No issues with pacer site. Stroke sxs of dysarthria have essentially resolved. No focal weakness.  EKG revealed afib with rates in the 130s, TWI and flattenign in lateral and inferior leads. Labs are significant for elevated glucose.  In ER, started on diltiazem gtt at 5, rates now in the 90s to 100s.  Has been on aspirin for stroke prevention, plan was to start apixaban tomorrow.    Past Medical History  Diagnosis Date  . Allergy   . Asthma   . Type 2 diabetes mellitus   . GERD (gastroesophageal reflux disease)   . Hyperlipidemia   . Hypertension   . Kidney stones   . Rheumatic fever   . TMJ arthritis   . Heart murmur   . Ectopic pregnancy   . Hx of rheumatic fever 08/07/2011  . PAF (paroxysmal atrial fibrillation)     Refused Xarelto 09/2012 due to risk of bleeding  . Obesity   . Stroke      Surgical History:  Past Surgical History  Procedure Laterality Date  .  Cholecystectomy    . Tubal ligation      ectopic preganacy   . Destruction trigeminal nerve via neurolytic agent  NOv @013     Gamma Knife   . Pacemaker insertion  12-30-12    MDT dual chamber pacemaker implanted by Dr [name] for tachy-brady syndrome     Home Meds: Prior to Admission medications   Medication Sig Start Date End Date Taking? Authorizing Provider  apixaban (ELIQUIS) 5 MG TABS tablet Take 1 tablet (5 mg total) by mouth 2 (two) times daily. 01/03/13  Yes Lonia Blood, MD  aspirin EC 325 MG EC tablet Take one tablet on 12/11 then stop taking 01/01/13  Yes Lonia Blood, MD  diltiazem (CARDIZEM CD) 180 MG 24 hr capsule Take 1 capsule (180 mg total) by mouth daily. 01/01/13  Yes Lonia Blood, MD  famotidine (PEPCID) 10 MG tablet Take 10 mg by mouth 2 (two) times daily as needed.   Yes Historical Provider, MD  losartan (COZAAR) 100 MG tablet take 1 tablet by mouth once daily 10/07/12  Yes [name], MD  metoprolol succinate (TOPROL-XL) 100 MG 24 hr tablet Take 1 tablet (100 mg total) by mouth daily. Take with or immediately following a meal. 01/01/13  Yes Lonia Blood, MD  pravastatin (PRAVACHOL) 80 MG tablet Take 1 tablet (80 mg total) by mouth daily. 01/01/13  Yes Lonia Blood, MD  glucose blood (ONE TOUCH ULTRA TEST) test strip Pt checks blood sugar twice a  day and as needed.250.00 09/07/11   [name], MD  [facility] DELICA LANCETS MISC Checking blood sugar twice a day and as needed. 250.00 09/07/11   [name], MD    Allergies:  Allergies  Allergen Reactions  . Fish Allergy     Shell fish  . Penicillins   . Ace Inhibitors Cough    History   Social History  . Marital Status: Married    Spouse Name: N/A    Number of Children: 2  . Years of Education: N/A   Occupational History  . cosmotologist    Social History Main Topics  . Smoking status: Former Smoker -- 2.00 packs/day    Types: Cigarettes    Quit date: 01/24/1968  .  Smokeless tobacco: Never Used  . Alcohol Use: 0.6 oz/week    1 Glasses of wine per week  . Drug Use: No  . Sexual Activity: Not on file   Other Topics Concern  . Not on file   Social History Narrative  . No narrative on file     Family History  Problem Relation Age of Onset  . Stroke Mother   . Diabetes Mother     Review of Systems: General: negative for chills, fever, night sweats or weight changes.  Cardiovascular: see HPI Dermatological: negative for rash  Respiratory: negative for cough or wheezing Urologic: negative for hematuria Abdominal: negative for nausea, vomiting, diarrhea, bright red blood per rectum, melena, or hematemesis Neurologic: negative for visual changes, syncope, or dizziness All other systems reviewed and are otherwise negative except as noted above.  Labs:   Lab Results  Component Value Date   WBC 9.4 01/02/2013   HGB 15.6* 01/02/2013   HCT 44.5 01/02/2013   MCV 90.8 01/02/2013   PLT 188 01/02/2013    Recent Labs Lab 01/02/13 1954  NA 139  K 4.3  CL 104  CO2 22  BUN 23  CREATININE 1.10  CALCIUM 9.2  PROT 7.3  BILITOT 0.4  ALKPHOS 101  ALT 34  AST 22  GLUCOSE 244*   No results found for this basename: CKTOTAL, CKMB, TROPONINI,  in the last 72 hours Lab Results  Component Value Date   CHOL 227* 12/28/2012   HDL 50 12/28/2012   LDLCALC 154* 12/28/2012   TRIG 114 12/28/2012   No results found for this basename: DDIMER      EKG: afib with RVR. TWI and flattening inferolaterally. Minimal change from priors.  Physical Exam: Blood pressure 154/110, pulse 126, temperature 98.7 F (37.1 C), temperature source Oral, resp. rate 20, weight 84 kg (185 lb 3 oz), SpO2 96.00%. General: Well developed, well nourished, in no acute distress. Head: Normocephalic, atraumatic, sclera non-icteric, nares are without discharge Neck: Supple. Negative for carotid bruits. JVD not elevated. Lungs: Clear bilaterally to auscultation without wheezes,  rales, or rhonchi. Breathing is unlabored. Pacer site c/d/i Heart: irreg. No murmurs, rubs, or gallops appreciated. Abdomen: Soft, non-tender, non-distended with normoactive bowel sounds. No rebound/guarding. No obvious abdominal masses. Msk:  Strength and tone appear normal for age. Extremities: No edema. No clubbing or cyanosis. Distal pedal pulses are 2+ and equal bilaterally. Neuro: Alert and oriented X 3. Moves all extremities spontaneously. Psych:  Responds to questions appropriately with a normal affect.   Problem List 1. Afib with RVR 2. Tachybrady s/p pacer 3. Recent CVA, no residual symptoms 4. HTN  5. Diabetes, currently attempting diet control 6. Obesity  ASSESSMENT AND PLAN:   67 y.o. female w/ PMHx  significant for DM2, HTN and recent hospitalization for stroke and afib with tachybrady requiring a pacer who presented to [facility] on 01/02/2013 without complaints but noted to have fast heart rate on monitor at home --> found to be in afib with RVR, completely asymptomatic.  Recent workup for tachybrady with echo with mildly reduced EF, normal atria bilaterally. Normal TSH. No known provoking factors. Plan at this time is to rate controlled with IV diltiazem and continue oral beta blocker. Perhaps with spontaneously convert but will keep npo in case cardioversion procedure is needed (<48 hours based upon frequent monitoring at home as well as recent discharge from hospital yesterday). Start apixaban in the AM as previously planned.  Continue ACEI and statin.  Cover diabetes with sliding scale for now.  Full code. Anticoagulate with apixaban in the AM.  Signed, Alanah Sakuma C. MD 01/02/2013, 10:42 PM

## 2013-01-02 NOTE — ED Notes (Signed)
Dr.Lockwood at bedside  

## 2013-01-02 NOTE — ED Provider Notes (Signed)
CSN: [id number]     Arrival date & time 01/02/13  1945 History   First MD Initiated Contact with Patient 01/02/13 2048     Chief Complaint  Patient presents with  . Tachycardia   (Consider location/radiation/quality/duration/timing/severity/associated sxs/prior Treatment) HPI Ms. STASHA NARAINE is a 67 y.o. female w/ PMHx of HTN, HLD, DM type II, GERD, PAF, and recent CVA (12/28/12), presents to the ED after found to be tachycardic at home. Patient was discharged from the hospital yesterday after admission for left MCA territory infarct, involving the left posterior frontal lobe. On admission, patient was also found to be in A-fib w/ RVR, was put on a Cardizem gtt. Patient also found to have tachy-brady syndrome and underwent PPM placement during same admission. Ms. Lehrman was sent home w/ Cardizem CD 180 mg po qd + Toprol XL 100 mg qd. Today, when checking her BP and pulse, patient found to have a rate in 130's. No symptoms present. She denies dizziness, lightheadedness, SOB, chest pain, or palpitations. No further stroke symptoms.  Patient is to start Eliquis tomorrow in AM, given recent CVA.  Past Medical History  Diagnosis Date  . Allergy   . Asthma   . Type 2 diabetes mellitus   . GERD (gastroesophageal reflux disease)   . Hyperlipidemia   . Hypertension   . Kidney stones   . Rheumatic fever   . TMJ arthritis   . Heart murmur   . Ectopic pregnancy   . Hx of rheumatic fever 08/07/2011  . PAF (paroxysmal atrial fibrillation)     Refused Xarelto 09/2012 due to risk of bleeding  . Obesity   . Stroke    Past Surgical History  Procedure Laterality Date  . Cholecystectomy    . Tubal ligation      ectopic preganacy   . Destruction trigeminal nerve via neurolytic agent  NOv @013     Gamma Knife   . Pacemaker insertion  12-30-12    MDT dual chamber pacemaker implanted by Dr [name] for tachy-brady syndrome   Family History  Problem Relation Age of Onset  . Stroke Mother   .  Diabetes Mother    History  Substance Use Topics  . Smoking status: Former Smoker -- 2.00 packs/day    Types: Cigarettes    Quit date: 01/24/1968  . Smokeless tobacco: Never Used  . Alcohol Use: 0.6 oz/week    1 Glasses of wine per week   OB History   Grav Para Term Preterm Abortions TAB SAB Ect Mult Living                 Review of Systems General: Denies fever, chills, diaphoresis, appetite change and fatigue.  Respiratory: Denies SOB, DOE, cough, chest tightness, and wheezing.   Cardiovascular: Denies chest pain, palpitations and leg swelling.  Gastrointestinal: Denies nausea, vomiting, abdominal pain, diarrhea, constipation, blood in stool and abdominal distention.  Genitourinary: Denies dysuria, urgency, frequency, hematuria, flank pain and difficulty urinating.  Endocrine: Denies hot or cold intolerance, sweats, polyuria, polydipsia. Musculoskeletal: Denies myalgias, back pain, joint swelling, arthralgias and gait problem.  Skin: Denies pallor, rash and wounds.  Neurological: Denies dizziness, seizures, syncope, weakness, lightheadedness, numbness and headaches.  Psychiatric/Behavioral: Denies mood changes, confusion, nervousness, sleep disturbance and agitation.  Allergies  Fish allergy; Penicillins; and Ace inhibitors  Home Medications   Current Outpatient Rx  Name  Route  Sig  Dispense  Refill  . apixaban (ELIQUIS) 5 MG TABS tablet   Oral   Take  1 tablet (5 mg total) by mouth 2 (two) times daily.   60 tablet   0   . aspirin EC 325 MG EC tablet      Take one tablet on 12/11 then stop taking   30 tablet   0   . diltiazem (CARDIZEM CD) 180 MG 24 hr capsule   Oral   Take 1 capsule (180 mg total) by mouth daily.   30 capsule   0   . famotidine (PEPCID) 10 MG tablet   Oral   Take 10 mg by mouth 2 (two) times daily as needed.         Marland Kitchen losartan (COZAAR) 100 MG tablet      take 1 tablet by mouth once daily   30 tablet   5   . metoprolol succinate  (TOPROL-XL) 100 MG 24 hr tablet   Oral   Take 1 tablet (100 mg total) by mouth daily. Take with or immediately following a meal.   30 tablet   0   . pravastatin (PRAVACHOL) 80 MG tablet   Oral   Take 1 tablet (80 mg total) by mouth daily.   30 tablet   0   . glucose blood (ONE TOUCH ULTRA TEST) test strip      Pt checks blood sugar twice a day and as needed.250.00   100 each   11   . ONETOUCH DELICA LANCETS MISC      Checking blood sugar twice a day and as needed. 250.00   100 each   11    Physical Exam Filed Vitals:   01/02/13 2054 01/02/13 2100 01/02/13 2106 01/02/13 2150  BP: 152/86 157/71 152/86 154/110  Pulse:  116 130 126  Temp:   98.7 F (37.1 C)   TempSrc:      Resp:  25 24 20   Weight:      SpO2:  96% 95% 96%  General: Vital signs reviewed.  Patient is a well-developed and well-nourished, in no acute distress and cooperative with exam. Alert and oriented x3.  Head: Normocephalic and atraumatic. Eyes: PERRL, EOMI, conjunctivae normal, No scleral icterus.  Neck: Supple, trachea midline, normal ROM, No JVD, masses, thyromegaly, or carotid bruit present.  Cardiovascular: Tachycardic, rhythm irregularly irregular, S1 normal, S2 normal. Pulmonary/Chest: Normal respiratory effort, CTAB, no wheezes, rales, or rhonchi. Abdominal: Soft, non-tender, non-distended, bowel sounds are normal, no masses, organomegaly, or guarding present.  Musculoskeletal: No joint deformities, erythema, or stiffness, ROM full and no nontender. Extremities: No swelling or edema,  pulses symmetric and intact bilaterally. No cyanosis or clubbing. Neurological: A&O x3, Strength is normal and symmetric bilaterally, cranial nerve II-XII are grossly intact, no focal motor deficit, sensory intact to light touch bilaterally.  Skin: Warm, dry and intact. No rashes or erythema. Psychiatric: Normal mood and affect. speech and behavior is normal. Cognition and memory are normal.   ED Course  Procedures  (including critical care time) Labs Review Labs Reviewed  CBC WITH DIFFERENTIAL - Abnormal; Notable for the following:    Hemoglobin 15.6 (*)    All other components within normal limits  COMPREHENSIVE METABOLIC PANEL - Abnormal; Notable for the following:    Glucose, Bld 244 (*)    GFR calc non Af Amer 51 (*)    GFR calc Af Amer 59 (*)    All other components within normal limits   Imaging Review No results found.  EKG Interpretation    Date/Time:  Thursday January 02 2013 19:49:06 EST Ventricular  Rate:  128 PR Interval:    QRS Duration: 74 QT Interval:  324 QTC Calculation: 473 R Axis:   82 Text Interpretation:  Atrial fibrillation with rapid ventricular response Low voltage QRS Septal infarct , age undetermined ST \\T \ T wave abnormality, consider inferior ischemia Abnormal ECG afib with rapid ventricular response Abnormal ekg Confirmed by Gerhard Munch  MD [phone]) on 01/02/2013 7:58:26 PM            MDM   Ms. ENEIDA EVERS is a 67 y.o. female w/ PMHx of HTN, HLD, DM type II, GERD, PAF, and recent CVA (12/28/12), presents to the ED after found to be tachycardic at home, found to be in A-fib w/ RVR. Patient has a history of PAF, previously refused anti-coagulation, now w/ L. MCA territory infarct. Recent PPM placement for tachy-brady syndrome. No symptoms at this time.  -CBC, CMET wnl. -EKG shows A-fib w/ rate of 128, w/ low voltage QRS. -Started on Cardizem gtt + 20 mg IV bolus.  Discussed w/ Cardiology, will admit.  Courtney Paris, MD 01/02/13 2255

## 2013-01-03 DIAGNOSIS — I4891 Unspecified atrial fibrillation: Secondary | ICD-10-CM | POA: Diagnosis not present

## 2013-01-03 DIAGNOSIS — E785 Hyperlipidemia, unspecified: Secondary | ICD-10-CM | POA: Diagnosis not present

## 2013-01-03 DIAGNOSIS — I10 Essential (primary) hypertension: Secondary | ICD-10-CM | POA: Diagnosis not present

## 2013-01-03 DIAGNOSIS — E119 Type 2 diabetes mellitus without complications: Secondary | ICD-10-CM | POA: Diagnosis not present

## 2013-01-03 LAB — BASIC METABOLIC PANEL
BUN: 17 mg/dL (ref 6–23)
CO2: 23 mEq/L (ref 19–32)
Calcium: 9.1 mg/dL (ref 8.4–10.5)
Creatinine, Ser: 0.79 mg/dL (ref 0.50–1.10)
GFR calc Af Amer: >90 mL/min (ref >90)
GFR calc non Af Amer: 84 mL/min — ABNORMAL LOW (ref >90)
Glucose, Bld: 171 mg/dL — ABNORMAL HIGH (ref 70–99)
Sodium: 138 mEq/L (ref 135–145)

## 2013-01-03 LAB — CBC
MCH: 31.1 pg (ref 26.0–34.0)
MCHC: 34.4 g/dL (ref 30.0–36.0)
MCV: 90.3 fL (ref 78.0–100.0)
Platelets: 187 10*3/uL (ref 150–400)
RBC: 4.73 MIL/uL (ref 3.87–5.11)

## 2013-01-03 LAB — GLUCOSE, CAPILLARY: Glucose-Capillary: 151 mg/dL — ABNORMAL HIGH (ref 70–99)

## 2013-01-03 MED ORDER — METOPROLOL SUCCINATE ER 50 MG PO TB24
150.0000 mg | ORAL_TABLET | Freq: Every day | ORAL | Status: DC
  Administered 2013-01-03: 150 mg via ORAL
  Filled 2013-01-03: qty 1

## 2013-01-03 MED ORDER — APIXABAN 5 MG PO TABS
5.0000 mg | ORAL_TABLET | Freq: Two times a day (BID) | ORAL | Status: DC
  Administered 2013-01-03: 5 mg via ORAL
  Filled 2013-01-03 (×2): qty 1

## 2013-01-03 MED ORDER — METOPROLOL SUCCINATE ER 50 MG PO TB24: 150.0000 mg | ORAL_TABLET | Freq: Every day | ORAL | Status: DC

## 2013-01-03 MED ORDER — ONDANSETRON HCL 4 MG/2ML IJ SOLN: 4.0000 mg | Freq: Four times a day (QID) | INTRAMUSCULAR | Status: DC | PRN

## 2013-01-03 MED ORDER — INSULIN ASPART 100 UNIT/ML ~~LOC~~ SOLN
0.0000 [IU] | Freq: Three times a day (TID) | SUBCUTANEOUS | Status: DC
  Administered 2013-01-03 (×2): 3 [IU] via SUBCUTANEOUS

## 2013-01-03 MED ORDER — FAMOTIDINE 10 MG PO TABS
10.0000 mg | ORAL_TABLET | Freq: Two times a day (BID) | ORAL | Status: DC
  Administered 2013-01-03: 10 mg via ORAL
  Filled 2013-01-03 (×3): qty 1

## 2013-01-03 MED ORDER — SIMVASTATIN 40 MG PO TABS: 40.0000 mg | ORAL_TABLET | Freq: Every day | ORAL | Status: DC

## 2013-01-03 MED ORDER — PRAVASTATIN SODIUM 40 MG PO TABS
80.0000 mg | ORAL_TABLET | Freq: Every day | ORAL | Status: DC
  Filled 2013-01-03: qty 2

## 2013-01-03 MED ORDER — APIXABAN 5 MG PO TABS: 5.0000 mg | ORAL_TABLET | Freq: Two times a day (BID) | ORAL | Status: DC

## 2013-01-03 MED ORDER — PANTOPRAZOLE SODIUM 40 MG PO TBEC: 40.0000 mg | DELAYED_RELEASE_TABLET | Freq: Every day | ORAL | Status: DC

## 2013-01-03 MED ORDER — LOSARTAN POTASSIUM 50 MG PO TABS
100.0000 mg | ORAL_TABLET | Freq: Every day | ORAL | Status: DC
  Filled 2013-01-03: qty 2

## 2013-01-03 MED ORDER — METOPROLOL SUCCINATE ER 100 MG PO TB24
100.0000 mg | ORAL_TABLET | Freq: Every day | ORAL | Status: DC
  Filled 2013-01-03: qty 1

## 2013-01-03 MED ORDER — DILTIAZEM HCL ER COATED BEADS 180 MG PO CP24
180.0000 mg | ORAL_CAPSULE | Freq: Every day | ORAL | Status: DC
  Administered 2013-01-03: 180 mg via ORAL
  Filled 2013-01-03: qty 1

## 2013-01-03 MED ORDER — ACETAMINOPHEN 325 MG PO TABS: 650.0000 mg | ORAL_TABLET | ORAL | Status: DC | PRN

## 2013-01-03 NOTE — Progress Notes (Signed)
Pt discharged to home per MD order. Pt and husband received and reviewed all discharge instructions and medication information including follow-up appointments and prescription information. Pt and husband verbalized understanding. Pt alert and oriented at discharge with no complaints of pain. Pt escorted to private vehicle via wheelchair by nurse tech. Joylene Grapes

## 2013-01-03 NOTE — Progress Notes (Signed)
    Patient: Nina Carpenter Date of Encounter: 01/03/2013, 7:46 AM Admit date: 01/02/2013     Subjective  Ms. Southers has no complaints stating, "I feel fine." She denies CP, SOB or palpitations.   Objective  Physical Exam: Vitals: BP 112/72  Pulse 105  Temp(Src) 98.3 F (36.8 C) (Oral)  Resp 20  Ht 5\' 3"  (1.6 m)  Wt 187 lb 14.4 oz (85.231 kg)  BMI 33.29 kg/m2  SpO2 97% General: Well developed, well appearing 67 year old female in no acute distress. Neck: Supple. JVD not elevated. Lungs: Clear bilaterally to auscultation without wheezes, rales, or rhonchi. Breathing is unlabored. Heart: Irregular S1 S2 without murmurs, rubs, or gallops.  Abdomen: Soft, non-distended. Extremities: No clubbing or cyanosis. No edema.  Distal pedal pulses are 2+ and equal bilaterally. Neuro: Alert and oriented X 3. Moves all extremities spontaneously. No focal deficits. Skin: Left upper chest / implant site intact and appears well healing without hematoma.  Intake/Output: No intake or output data in the 24 hours ending 01/03/13 0746  Inpatient Medications:  . apixaban  5 mg Oral BID  . famotidine  10 mg Oral BID  . insulin aspart  0-15 Units Subcutaneous TID WC  . losartan  100 mg Oral Daily  . metoprolol succinate  100 mg Oral Daily  . pravastatin  80 mg Oral q1800   . diltiazem (CARDIZEM) infusion 5 mg/hr (01/02/13 2253)    Labs:  Recent Labs  01/02/13 1954  NA 139  K 4.3  CL 104  CO2 22  GLUCOSE 244*  BUN 23  CREATININE 1.10  CALCIUM 9.2    Recent Labs  01/02/13 1954  AST 22  ALT 34  ALKPHOS 101  BILITOT 0.4  PROT 7.3  ALBUMIN 4.1    Recent Labs  01/02/13 1954 01/03/13 0655  WBC 9.4 9.3  NEUTROABS 5.7  --   HGB 15.6* 14.7  HCT 44.5 42.7  MCV 90.8 90.3  PLT 188 187    Radiology/Studies: Dg Chest 2 View 12/31/2012   CLINICAL DATA:  Pacemaker implantation.  EXAM: CHEST  2 VIEW  COMPARISON:  12/30/2012.  FINDINGS: Cardiopericardial silhouette is upper  limits of normal for projection. Small left pleural effusion is present. Two lead left subclavian cardiac pacemaker. Mediastinal contours are within normal limits. Aortic arch atherosclerosis. No airspace disease or pulmonary edema. No pneumothorax status post pacemaker placement.  IMPRESSION: Uncomplicated left subclavian cardiac pacemaker. Improved lung volumes compared to yesterday. Small left pleural effusion and borderline heart size.   Electronically Signed   By: Andreas Newport M.D.   On: 12/31/2012 09:18   Telemetry: AFib   Assessment and Plan  1. AFib w/ RVR - asymptomatic - rate improved with IV diltiazem - transition back to PO diltiazem - continue metoprolol and will up-titrate to 150 mg once daily with close watch over BP - continue Eliquis  2. Tachy-brady syndrome s/p PPM implant 12/30/2012 - implant site intact without hematoma - follow-up as scheduled next week in [place] office for wound check  Dr. [name] to see Signed, EDMISTEN, BROOKE PA-C  Will attempt rate control with augmented betablocker as has mild LV dysfunction If unsatisfactory will pursue rhythm control Reviewed indciations for hospitalizations Begin apixoban  Wound check next week

## 2013-01-03 NOTE — Progress Notes (Signed)
UR completed 

## 2013-01-03 NOTE — Discharge Summary (Signed)
ELECTROPHYSIOLOGY DISCHARGE SUMMARY    Patient ID: Nina Carpenter,  MRN: [id number], DOB/AGE: [birth year] 67 y.o.  Admit date: 01/02/2013 Discharge date: 01/03/2013  Primary Care Physician: [name], MD Primary Cardiologist: Mariah Milling, MD Primary EP: [name], MD  Primary Discharge Diagnosis:  1. Atrial fibrillation with rapid ventricular response  Secondary Discharge Diagnoses:  1. Tachy-brady syndrome s/p recent PPM implant 2. HTN 3. DM 4. Asthma 5. Dyslipidemia  Procedures This Admission:  None   History and Hospital Course:  Nina Carpenter is a 67 y.o. female with PAF (refused anticoagulation earlier this year), DM, HTN, asthma, dyslipidemia and obesity who was admitted last week to [facility] for acute L MCA CVA. She also had rapid AF. While admitted she developed intermitten slow AF with pauses 6 seconds in duration. She underwent PPM implantation on 12/30/2012. She was discharged on 01/01/2013.   Yesterday she was checking her BP and pulse at home (as requested by Neurology post CVA) when she noticed her pulse reading was high. She was "feeling fine" but was concerned and came to ED. In ED she was found to have rapid AF. She was aysymptomatic. She denied CP, SOB, palpitations, dizziness or syncope. She was started on IV diltiazem for rate control. Her rate improved. IV diltiazem was discontinued. She was continued on PO diltiazem at 180 mg once daily. Metoprolol succinate was up-titrated to 150 mg once daily. She was continued on Eliquis. She remained hemodynamically stable and afebrile. She was seen, examined and deemed stable for discharge to home today by Dr. Berton Mount.    Discharge Vitals: Blood pressure 124/81, pulse 105, temperature 98.3 F (36.8 C), temperature source Oral, resp. rate 20, height 5\' 3"  (1.6 m), weight 187 lb 14.4 oz (85.231 kg), SpO2 97.00%.   Labs: Lab Results  Component Value Date   WBC 9.3 01/03/2013   HGB 14.7 01/03/2013   HCT 42.7  01/03/2013   MCV 90.3 01/03/2013   PLT 187 01/03/2013     Recent Labs Lab 01/02/13 1954 01/03/13 0655  NA 139 138  K 4.3 4.1  CL 104 104  CO2 22 23  BUN 23 17  CREATININE 1.10 0.79  CALCIUM 9.2 9.1  PROT 7.3  --   BILITOT 0.4  --   ALKPHOS 101  --   ALT 34  --   AST 22  --   GLUCOSE 244* 171*   Lab Results  Component Value Date   TROPONINI <0.30 12/29/2012     Disposition:  The patient is being discharged in stable condition.  Follow-up:     Follow-up Information   Follow up with [facility] On 01/09/2013. (At 10:00 AM for wound check)    Specialty:  Cardiology   Contact information:   [address], [address] [place] [address] [phone]      Follow up with [facility] On 04/08/2013. (At 9:15 AM)    Specialty:  Cardiology   Contact information:   [address], [address] [place] [address] [phone]     Discharge Medications:    Medication List    STOP taking these medications       aspirin 325 MG EC tablet      TAKE these medications       apixaban 5 MG Tabs tablet  Commonly known as:  ELIQUIS  Take 1 tablet (5 mg total) by mouth 2 (two) times daily.     apixaban 5 MG Tabs tablet  Commonly known as:  ELIQUIS  Take 1 tablet (5 mg total) by mouth 2 (two) times daily.     diltiazem 180 MG 24 hr capsule  Commonly known as:  CARDIZEM CD  Take 1 capsule (180 mg total) by mouth daily.     famotidine 10 MG tablet  Commonly known as:  PEPCID  Take 10 mg by mouth 2 (two) times daily as needed.     glucose blood test strip  Commonly known as:  ONE TOUCH ULTRA TEST  Pt checks blood sugar twice a day and as needed.250.00     losartan 100 MG tablet  Commonly known as:  COZAAR  take 1 tablet by mouth once daily     metoprolol succinate 50 MG 24 hr tablet  Commonly known as:  TOPROL-XL  Take 3 tablets (150 mg total) by mouth daily. Take with or immediately following a meal.     ONETOUCH  DELICA LANCETS Misc  Checking blood sugar twice a day and as needed. 250.00     pravastatin 80 MG tablet  Commonly known as:  PRAVACHOL  Take 1 tablet (80 mg total) by mouth daily.       Duration of Discharge Encounter: Greater than 30 minutes including physician time.  Signed, Rick Duff, PA-C 01/03/2013, 10:35 AM

## 2013-01-03 NOTE — ED Provider Notes (Addendum)
This patient was seen in conjunction with the resident physician, Dr. Yetta Barre.  The documentation is accurate and details the evaluation.  On my exam, patient was awake and alert, interacting appropriately.  However, she did have notably abnormal vital signs on the evaluation.  With concern for this atrial fibrillation with rapid ventricular response she was started on diltiazem drip following a bolus. Notably the patient's recent history of CVA, and has not yet started her full anti-platelet / anti-coagulation regimen.  She did deny any new neurologic complaints, or any significant pain.  With her dysrhythmia, history of thromboembolic processes were discussed her case with our cardiology team.  The patient's emergency room course her heart rate decreased appropriately, and she had no new complaints.  I saw the initial ECG, agree with the interpretation.  The patient was admitted to the cardiology team.  CRITICAL CARE Performed by: Gerhard Munch Total critical care time: 40 Critical care time was exclusive of separately billable procedures and treating other patients. Critical care was necessary to treat or prevent imminent or life-threatening deterioration. Critical care was time spent personally by me on the following activities: development of treatment plan with patient and/or surrogate as well as nursing, discussions with consultants, evaluation of patient's response to treatment, examination of patient, obtaining history from patient or surrogate, ordering and performing treatments and interventions, ordering and review of laboratory studies, ordering and review of radiographic studies, pulse oximetry and re-evaluation of patient's condition.    Gerhard Munch, MD 01/03/13 1324  Gerhard Munch, MD 01/20/13 [phone]

## 2013-01-06 ENCOUNTER — Encounter: Payer: Self-pay | Admitting: Family Medicine

## 2013-01-06 ENCOUNTER — Ambulatory Visit (INDEPENDENT_AMBULATORY_CARE_PROVIDER_SITE_OTHER): Payer: Medicare Other | Admitting: Family Medicine

## 2013-01-06 VITALS — BP 140/80 | HR 117 | Temp 98.4°F | Ht 63.0 in | Wt 188.5 lb

## 2013-01-06 DIAGNOSIS — Z9119 Patient's noncompliance with other medical treatment and regimen: Secondary | ICD-10-CM

## 2013-01-06 DIAGNOSIS — I495 Sick sinus syndrome: Secondary | ICD-10-CM | POA: Diagnosis not present

## 2013-01-06 DIAGNOSIS — E119 Type 2 diabetes mellitus without complications: Secondary | ICD-10-CM

## 2013-01-06 DIAGNOSIS — I4891 Unspecified atrial fibrillation: Secondary | ICD-10-CM | POA: Diagnosis not present

## 2013-01-06 DIAGNOSIS — E785 Hyperlipidemia, unspecified: Secondary | ICD-10-CM

## 2013-01-06 DIAGNOSIS — E1159 Type 2 diabetes mellitus with other circulatory complications: Secondary | ICD-10-CM

## 2013-01-06 DIAGNOSIS — I798 Other disorders of arteries, arterioles and capillaries in diseases classified elsewhere: Secondary | ICD-10-CM

## 2013-01-06 DIAGNOSIS — I6350 Cerebral infarction due to unspecified occlusion or stenosis of unspecified cerebral artery: Secondary | ICD-10-CM

## 2013-01-06 DIAGNOSIS — I639 Cerebral infarction, unspecified: Secondary | ICD-10-CM | POA: Insufficient documentation

## 2013-01-06 DIAGNOSIS — I10 Essential (primary) hypertension: Secondary | ICD-10-CM

## 2013-01-06 DIAGNOSIS — Z950 Presence of cardiac pacemaker: Secondary | ICD-10-CM

## 2013-01-06 MED ORDER — METFORMIN HCL ER 500 MG PO TB24: 500.0000 mg | ORAL_TABLET | Freq: Every day | ORAL | Status: DC

## 2013-01-06 NOTE — Progress Notes (Signed)
Date:  01/06/2013   Name:  Nina Carpenter   DOB:  August 25, 1945   MRN:  [id number] Gender: female Age: 67 y.o.  Primary Physician:  [name], MD   Chief Complaint: Hospitalization Follow-up   Subjective:   History of Present Illness:  Nina Carpenter is a 67 y.o. pleasant patient who presents with the following:   Future Appointments Provider Department Dept Phone   01/09/2013 10:00 AM Duke Salvia, MD [facility] [phone]   03/17/2013 3:00 PM Ronal Fear, NP [facility] [phone]   04/07/2013 11:00 AM [name], MD [facility] [place] [phone]   04/08/2013 9:15 AM Duke Salvia, MD [facility] [phone]     Patient is well known to me who presents in hospital followup after admission on 12/28/2012 with an acute stroke. Subsequently, she was found to be in tachybradycardia syndrome, and intermittently in atrial fibrillation with rapid ventricular response. She ultimately was given a cardiac pacemaker, and she required IV diltiazem in the hospital. Stroke involved her left MCA territory including her frontal lobe and parietal lobe on the left side. She initially had multiple neurological difficulties and difficulties with speech, but at the time of her discharge and today in the office she has virtually no problems. She does have some slight problem occasionally finding words, but she and her partner do not feel she has any long-term deficit.  At this time she is open to staying on Eliquis long term. Previously, she had refused anything greater than aspirin, and she had refused treatment for diabetes, and in general, she is often stopped her hypertensive medications and her lipid medications as well.  On her most recent office visit with me, I actually tried to restart her on her diabetes medication, but she never did start him.  At this point, she is open and willing to change, and wants to make  some radical lifestyle changes.  MR#: [id number]  DOB:11/01/1945   Date of Admission: 12/28/2012 Date of Discharge: 01/01/2013  Attending Physician:MCCLUNG,JEFFREY T  Patient's YQI:[id number] Roneka Gilpin, MD  Consults:  Cardiology   EP Neurology/Stroke Team  Tests Needing Follow-up: Recheck of BP and CBG is suggested - she will likely need to begin oral DM meds soon  Discharge Diagnoses: Thromboembolic CVA - acute L MCA infarct involving the L posterior frontal lobe and anterior left parietal lobe   Afib w/ RVR   Tachy-brady syndrome with post-termination pauses   HTN   DM   HLD   Obesity  67 y.o. female with a medical history of atrial fibrillation, diabetes, hypertension, GERD who presented with an episode of fall. She did not hit her head. When her husband came home he found that she was not coherent in her speech and had slurring of her words. She has history of atrial fibrillation and had been on aspirin. She was recommended anticoagulation in the past which she declined.   As per the husband while in the ED she had episodes of clear speech interspersed with confusion and slurred speech. In the ER an EKG revealed atrial fibrillation with RVR, (HR 150). Noncontrast head CT showed R parietal and R temporal encephalomalacia c/w prior insult/infarct. Follow-up MRI confirmed an acute L MCA infarct involving the L posterior frontal lobe and anterior left parietal lobe. Remote R MCA infarct. No hemorrage   Hospital Course:  Thromboembolic CVA - acute L MCA infarct involving the L posterior frontal lobe and anterior left parietal lobe  Neurology recommended 7 days of ASA alone before starting anticoagulation to minimize risk of hemorrhagic conversion - plan is to begin Apixaban 12/12 - Neuro agrees  Afib w/ RVR  Diagnosed Sept 2014 - pt refused anticoag previously - has now agreed to anticoagulation - rate controlled at time of d/c with adjustments made in rate controlling meds    Tachy-brady  syndrome with post-termination pauses   EP evaluation was completed due to tachy/brady - pt underwent successful placement of a dual pacemaker 12/8  HTN  BP control erratic - challending in setting of brady and recent CVA - Cardiology adjusted anti-hypertensives- follow BP as outpt - stressed need for medication compliance to pt and husband and son   DM  A1c 7.9 - does not appear to be on meds as oupt - stable on low dose lantus during hospital stay - will not yet begin oral agent at time of d/c due to addition of multiple other new meds - will need close outpt f/u w/ consideration to initiating oral med in next 4 weeks   HLD  LDL 154 - pt needs tx - LFTs normal - resumed previously prescribed home med    Patient Active Problem List   Diagnosis Date Noted  . CVA (cerebral vascular accident) 01/06/2013  . Cardiac pacemaker in situ 01/06/2013  . Atrial fibrillation with rapid ventricular response 01/02/2013  . Atrial fibrillation 12/29/2012  . Tachy-brady syndrome 12/29/2012  . Hx of rheumatic fever 08/07/2011  . TOBACCO USE 03/17/2010  . NEURALGIA, TRIGEMINAL 01/26/2010  . FATIGUE 01/26/2010  . Type 2 diabetes mellitus with vascular disease 05/04/2008  . HYPERLIPIDEMIA 05/04/2008  . HYPERTENSION 05/04/2008  . ALLERGIC RHINITIS 05/04/2008  . ASTHMA 05/04/2008  . GERD 05/04/2008  . POSTMENOPAUSAL STATUS 05/04/2008    Past Medical History  Diagnosis Date  . Allergy   . Asthma   . Type 2 diabetes mellitus   . GERD (gastroesophageal reflux disease)   . Hyperlipidemia   . Hypertension   . Kidney stones   . Rheumatic fever   . TMJ arthritis   . Heart murmur   . Ectopic pregnancy   . Hx of rheumatic fever 08/07/2011  . PAF (paroxysmal atrial fibrillation)     Refused Xarelto 09/2012 due to risk of bleeding  . Obesity   . Stroke   . CVA (cerebral vascular accident) 01/06/2013  . Cardiac pacemaker in situ 01/06/2013    Past Surgical History  Procedure Laterality Date  .  Cholecystectomy    . Tubal ligation      ectopic preganacy   . Destruction trigeminal nerve via neurolytic agent  NOv @013     Gamma Knife   . Pacemaker insertion  12-30-12    MDT dual chamber pacemaker implanted by Dr [name] for tachy-brady syndrome    History   Social History  . Marital Status: Married    Spouse Name: N/A    Number of Children: 2  . Years of Education: N/A   Occupational History  . cosmotologist    Social History Main Topics  . Smoking status: Former Smoker -- 2.00 packs/day    Types: Cigarettes    Quit date: 01/24/1968  . Smokeless tobacco: Never Used  . Alcohol Use: 0.6 oz/week    1 Glasses of wine per week  . Drug Use: No  . Sexual Activity: Not on file   Other Topics Concern  . Not on file   Social History Narrative  . No narrative on file  Family History  Problem Relation Age of Onset  . Stroke Mother   . Diabetes Mother     Allergies  Allergen Reactions  . Fish Allergy     Shell fish  . Penicillins   . Ace Inhibitors Cough    Medication list has been reviewed and updated.  Review of Systems:  As above, no cp, no sob, recent d/c with RVR, intermittent AF, no n/v, eating ok, no fever. Otherwise, ROS is as per the HPI.  Objective:   Physical Examination: BP 140/80  Pulse 117  Temp(Src) 98.4 F (36.9 C) (Oral)  Ht 5\' 3"  (1.6 m)  Wt 188 lb 8 oz (85.503 kg)  BMI 33.40 kg/m2  Ideal Body Weight: Weight in (lb) to have BMI = 25: 140.8   GEN: WDWN, NAD, Non-toxic, A & O x 3 HEENT: Atraumatic, Normocephalic. Neck supple. No masses, No LAD. Ears and Nose: No external deformity. CV: irreg, irreg. Rate 100 PULM: CTA B, no wheezes, crackles, rhonchi. No retractions. No resp. distress. No accessory muscle use. EXTR: No c/c/e NEURO Normal gait.  PSYCH: Normally interactive. Conversant. Not depressed or anxious appearing.  Calm demeanor.   Laboratory and Imaging Data:  Lipids:    Component Value Date/Time   CHOL 227*  12/28/2012 2348   TRIG 114 12/28/2012 2348   HDL 50 12/28/2012 2348   LDLDIRECT 178.0 11/18/2012 1049   VLDL 23 12/28/2012 2348   CHOLHDL 4.5 12/28/2012 2348    CBC:    Component Value Date/Time   WBC 9.3 01/03/2013 0655   HGB 14.7 01/03/2013 0655   HCT 42.7 01/03/2013 0655   PLT 187 01/03/2013 0655   MCV 90.3 01/03/2013 0655   NEUTROABS 5.7 01/02/2013 1954   LYMPHSABS 2.8 01/02/2013 1954   MONOABS 0.7 01/02/2013 1954   EOSABS 0.1 01/02/2013 1954   BASOSABS 0.0 01/02/2013 1954    Basic Metabolic Panel:    Component Value Date/Time   NA 138 01/03/2013 0655   K 4.1 01/03/2013 0655   CL 104 01/03/2013 0655   CO2 23 01/03/2013 0655   BUN 17 01/03/2013 0655   CREATININE 0.79 01/03/2013 0655   GLUCOSE 171* 01/03/2013 0655   CALCIUM 9.1 01/03/2013 0655    Lab Results  Component Value Date   ALT 34 01/02/2013   AST 22 01/02/2013   ALKPHOS 101 01/02/2013   BILITOT 0.4 01/02/2013    Lab Results  Component Value Date   TSH 1.404 12/30/2012     Lab Results  Component Value Date   HGBA1C 7.9* 12/29/2012    Ct Head Wo Contrast  12/28/2012   CLINICAL DATA:  Code stroke. History of gamma knife surgery for trigeminal neuralgia.  EXAM: CT HEAD WITHOUT CONTRAST  TECHNIQUE: Contiguous axial images were obtained from the base of the skull through the vertex without contrast.  COMPARISON:  None  FINDINGS: There is focal encephalomalacia in the right temporal-parietal lobe with mild extension into the adjacent white matter. Small focus of low density in the left frontal-parietal subcortical white matter on sequence 2, image 19. There is additional subcortical white matter disease in the right frontal lobe. No evidence for acute hemorrhage, mass lesion, midline shift or hydrocephalus. No evidence for a new large acute or subacute infarct. No acute bone abnormality  IMPRESSION: Negative for acute hemorrhage.  Encephalomalacia involving the right parietal and right temporal cortex. Findings are  consistent with a previous insult or infarct.  Scattered areas of white matter disease could represent chronic small  vessel ischemic changes.   Electronically Signed   By: Richarda Overlie M.D.   On: 12/28/2012 18:48   Mr Maxine Glenn Head Wo Contrast  12/28/2012   CLINICAL DATA:  Age indeterminate infarcts. Remote history of treated trigeminal neuralgia.  EXAM: MRI HEAD WITHOUT AND WITH CONTRAST  MRA HEAD WITHOUT CONTRAST  TECHNIQUE: Multiplanar, multiecho pulse sequences of the brain and surrounding structures were obtained without and with intravenous contrast. Angiographic images of the head were obtained using MRA technique without contrast.  CONTRAST:  20mL MULTIHANCE GADOBENATE DIMEGLUMINE 529 MG/ML IV SOLN  COMPARISON:  CT of the head December 28, 2012 at 1816 hr  FINDINGS: MRI HEAD FINDINGS  Approximately 24 x 17 mm wedge-like area of reduced diffusion within the left posterior frontal lobe, and the anterior aspect the left parietal lobe (post central gyrus), about the central sulcus, with corresponding low ADC values. 3 mm focus of reduced diffusion within the left insula cortex. Reduced diffusion, with normalized ADC values within bifrontal frontal lobes (distinct from left frontal acute ischemia) suggests subacute infarct. Right temporal parietal encephalomalacia associated with intrinsic T1 shortening most consistent with mineralization and mild ex vacuo dilatation of the subjacent ventricle, no hydrocephalus. No susceptibility artifact to suggest hemorrhage. No midline shift or mass effect. No suspicious intraparenchymal nor leptomeningeal enhancement. Very faint enhancement at the left 5th cranial nerve root entry zone may reflect post procedural change.  No abnormal extra-axial fluid collections. No suspicious calvarial bone marrow signal. No abnormal sellar expansion. Craniocervical junction maintained. Trace paranasal sinus mucosal thickening without air-fluid levels. Mastoid air cells appear well-aerated.  MRA  HEAD FINDINGS  Anterior circulation: Normal flow related enhancement of the included cervical, the petrous, cavernous and supra clinoid internal carotid arteries. Patent anterior communicating artery. Normal flow related enhancement of bilateral middle cerebral artery bifurcations and bilateral M1 segments, however there is poor flow related enhancement/occlusion of the left M2/3 branches associated with mild irregularity of the proximal vessel.  Posterior circulation: Right vertebral artery is dominant, the left vertebral artery terminates in a posterior inferior cerebellar artery. Normal flow related enhancement of the basilar artery; bilateral posterior communicating arteries contributing to the posterior circulation with normal flow related enhancement of the posterior cerebral arteries.  No aneurysm, no suspicious luminal irregularity.  IMPRESSION: MRI of the head: Acute left middle cerebral artery territory infarct involving the left posterior frontal lobe, anterior aspect of the left parietal lobe, without hemorrhagic conversion. Punctate focus of acute ischemia within the left insula.  Subacute additional bilateral middle cerebral artery territory infarcts, less likely related to hypoperfusion/watershed.  Right temporal parietal encephalomalacia most consistent with remote right middle cerebral artery territory infarct.  MRA of the head: Poor flow related enhancement of the left M2 and M3 branches concerning for slow flow versus occlusion with mild irregularity of the residual left M2 vessels which may reflect atherosclerosis. No high-grade stenosis.  Critical Value/emergent results were called by telephone at the time of interpretation on 12/28/2012 at 11:26 PM to Dr.P Allena Katz, who verbally acknowledged these results.   Electronically Signed   By: Awilda Metro   On: 12/28/2012 23:27   Mr Brain W Wo Contrast  12/28/2012   CLINICAL DATA:  Age indeterminate infarcts. Remote history of treated trigeminal  neuralgia.  EXAM: MRI HEAD WITHOUT AND WITH CONTRAST  MRA HEAD WITHOUT CONTRAST  TECHNIQUE: Multiplanar, multiecho pulse sequences of the brain and surrounding structures were obtained without and with intravenous contrast. Angiographic images of the head were obtained using MRA  technique without contrast.  CONTRAST:  20mL MULTIHANCE GADOBENATE DIMEGLUMINE 529 MG/ML IV SOLN  COMPARISON:  CT of the head December 28, 2012 at 1816 hr  FINDINGS: MRI HEAD FINDINGS  Approximately 24 x 17 mm wedge-like area of reduced diffusion within the left posterior frontal lobe, and the anterior aspect the left parietal lobe (post central gyrus), about the central sulcus, with corresponding low ADC values. 3 mm focus of reduced diffusion within the left insula cortex. Reduced diffusion, with normalized ADC values within bifrontal frontal lobes (distinct from left frontal acute ischemia) suggests subacute infarct. Right temporal parietal encephalomalacia associated with intrinsic T1 shortening most consistent with mineralization and mild ex vacuo dilatation of the subjacent ventricle, no hydrocephalus. No susceptibility artifact to suggest hemorrhage. No midline shift or mass effect. No suspicious intraparenchymal nor leptomeningeal enhancement. Very faint enhancement at the left 5th cranial nerve root entry zone may reflect post procedural change.  No abnormal extra-axial fluid collections. No suspicious calvarial bone marrow signal. No abnormal sellar expansion. Craniocervical junction maintained. Trace paranasal sinus mucosal thickening without air-fluid levels. Mastoid air cells appear well-aerated.  MRA HEAD FINDINGS  Anterior circulation: Normal flow related enhancement of the included cervical, the petrous, cavernous and supra clinoid internal carotid arteries. Patent anterior communicating artery. Normal flow related enhancement of bilateral middle cerebral artery bifurcations and bilateral M1 segments, however there is poor  flow related enhancement/occlusion of the left M2/3 branches associated with mild irregularity of the proximal vessel.  Posterior circulation: Right vertebral artery is dominant, the left vertebral artery terminates in a posterior inferior cerebellar artery. Normal flow related enhancement of the basilar artery; bilateral posterior communicating arteries contributing to the posterior circulation with normal flow related enhancement of the posterior cerebral arteries.  No aneurysm, no suspicious luminal irregularity.  IMPRESSION: MRI of the head: Acute left middle cerebral artery territory infarct involving the left posterior frontal lobe, anterior aspect of the left parietal lobe, without hemorrhagic conversion. Punctate focus of acute ischemia within the left insula.  Subacute additional bilateral middle cerebral artery territory infarcts, less likely related to hypoperfusion/watershed.  Right temporal parietal encephalomalacia most consistent with remote right middle cerebral artery territory infarct.  MRA of the head: Poor flow related enhancement of the left M2 and M3 branches concerning for slow flow versus occlusion with mild irregularity of the residual left M2 vessels which may reflect atherosclerosis. No high-grade stenosis.  Critical Value/emergent results were called by telephone at the time of interpretation on 12/28/2012 at 11:26 PM to Dr.P Allena Katz, who verbally acknowledged these results.   Electronically Signed   By: Awilda Metro   On: 12/28/2012 23:27   Assessment & Plan:    CVA (cerebral vascular accident) - Plan: Ambulatory referral to Nutrition and Diabetic Education  DM Type 2 - Plan: Ambulatory referral to Nutrition and Diabetic Education  Atrial fibrillation  Tachy-brady syndrome  Cardiac pacemaker in situ  HYPERLIPIDEMIA  HYPERTENSION  Atrial fibrillation with rapid ventricular response  History of noncompliance with medical treatment  Quite complicated case from a  neurological and cardiac standpoint. The patient is still in atrial fibrillation today. She has followup with cardiology in 3 days' time.  I suspect her diabetes will be under much better control if she takes her medication loses weight as she hopes to do. Consult nutrition.  Patient Instructions  F/u 3 months  REFERRAL: GO THE THE FRONT ROOM AT THE ENTRANCE OF OUR CLINIC, NEAR CHECK IN. ASK FOR MARION. SHE WILL HELP YOU SET UP  YOUR REFERRAL. DATE: TIME:    Orders Today:  Orders Placed This Encounter  Procedures  . Ambulatory referral to Nutrition and Diabetic Education    New medications, updates to list, dose adjustments: Meds ordered this encounter  Medications  . metFORMIN (GLUCOPHAGE XR) 500 MG 24 hr tablet    Sig: Take 1 tablet (500 mg total) by mouth daily with breakfast.    Dispense:  30 tablet    Refill:  0    Signed,  Jayr Lupercio T. Takesha Steger, MD, CAQ Sports Medicine  [facility] [address] [place] [address] Phone: [phone] Fax: [phone]  Updated Complete Medication List:   Medication List       This list is accurate as of: 01/06/13  5:27 PM.  Always use your most recent med list.               apixaban 5 MG Tabs tablet  Commonly known as:  ELIQUIS  Take 1 tablet (5 mg total) by mouth 2 (two) times daily.     diltiazem 180 MG 24 hr capsule  Commonly known as:  CARDIZEM CD  Take 1 capsule (180 mg total) by mouth daily.     glucose blood test strip  Commonly known as:  ONE TOUCH ULTRA TEST  Pt checks blood sugar twice a day and as needed.250.00     losartan 100 MG tablet  Commonly known as:  COZAAR  take 1 tablet by mouth once daily     metFORMIN 500 MG 24 hr tablet  Commonly known as:  GLUCOPHAGE XR  Take 1 tablet (500 mg total) by mouth daily with breakfast.     metoprolol succinate 50 MG 24 hr tablet  Commonly known as:  TOPROL-XL  Take 3 tablets (150 mg total) by mouth daily. Take with or immediately  following a meal.     ONETOUCH DELICA LANCETS Misc  Checking blood sugar twice a day and as needed. 250.00     pantoprazole 40 MG tablet  Commonly known as:  PROTONIX  Take 1 tablet (40 mg total) by mouth daily.     pravastatin 80 MG tablet  Commonly known as:  PRAVACHOL  Take 1 tablet (80 mg total) by mouth daily.

## 2013-01-06 NOTE — Patient Instructions (Signed)
F/u 3 months  REFERRAL: GO THE THE FRONT ROOM AT THE ENTRANCE OF OUR CLINIC, NEAR CHECK IN. ASK FOR MARION. SHE WILL HELP YOU SET UP YOUR REFERRAL. DATE: TIME:

## 2013-01-06 NOTE — Progress Notes (Signed)
Pre-visit discussion using our clinic review tool. No additional management support is needed unless otherwise documented below in the visit note.  

## 2013-01-08 ENCOUNTER — Ambulatory Visit: Payer: Medicare Other

## 2013-01-09 ENCOUNTER — Ambulatory Visit (INDEPENDENT_AMBULATORY_CARE_PROVIDER_SITE_OTHER): Payer: Medicare Other | Admitting: Internal Medicine

## 2013-01-09 ENCOUNTER — Ambulatory Visit (INDEPENDENT_AMBULATORY_CARE_PROVIDER_SITE_OTHER): Payer: Medicare Other | Admitting: *Deleted

## 2013-01-09 ENCOUNTER — Encounter: Payer: Medicare Other | Admitting: Internal Medicine

## 2013-01-09 DIAGNOSIS — I4891 Unspecified atrial fibrillation: Secondary | ICD-10-CM | POA: Diagnosis not present

## 2013-01-09 DIAGNOSIS — I495 Sick sinus syndrome: Secondary | ICD-10-CM

## 2013-01-09 LAB — MDC_IDC_ENUM_SESS_TYPE_INCLINIC
Brady Statistic AP VP Percent: 0.06 %
Brady Statistic AP VS Percent: 20.22 %
Brady Statistic AS VP Percent: 2.92 %
Brady Statistic AS VS Percent: 76.81 %
Brady Statistic RV Percent Paced: 2.98 %
Date Time Interrogation Session: 20141218123125
Lead Channel Impedance Value: 399 Ohm
Lead Channel Impedance Value: 494 Ohm
Lead Channel Impedance Value: 551 Ohm
Lead Channel Pacing Threshold Amplitude: 0.75 V
Lead Channel Sensing Intrinsic Amplitude: 1.625 mV
Lead Channel Sensing Intrinsic Amplitude: 9.25 mV
Lead Channel Setting Pacing Amplitude: 3.5 V
Lead Channel Setting Sensing Sensitivity: 2 mV
Zone Setting Detection Interval: 350 ms
Zone Setting Detection Interval: 400 ms

## 2013-01-09 NOTE — Progress Notes (Signed)
Pt continues to have symptoms of atrial fibrillation  We will begin flecainide 100 mg bid and undertake gxt myoview

## 2013-01-09 NOTE — Addendum Note (Signed)
Addended by: Rhea Belton R on: 01/09/2013 11:03 AM   Modules accepted: Level of Service

## 2013-01-09 NOTE — Progress Notes (Signed)
Wound check-PPM in office. 

## 2013-01-09 NOTE — Patient Instructions (Addendum)
Your physician has recommended you make the following change in your medication: start flecainide 100mg  twice daily on 01/17/13.  [facility] MYOVIEW  Your caregiver has ordered a Stress Test with nuclear imaging. The purpose of this test is to evaluate the blood supply to your heart muscle. This procedure is referred to as a "Non-Invasive Stress Test." This is because other than having an IV started in your vein, nothing is inserted or "invades" your body. Cardiac stress tests are done to find areas of poor blood flow to the heart by determining the extent of coronary artery disease (CAD). Some patients exercise on a treadmill, which naturally increases the blood flow to your heart, while others who are  unable to walk on a treadmill due to physical limitations have a pharmacologic/chemical stress agent called Lexiscan . This medicine will mimic walking on a treadmill by temporarily increasing your coronary blood flow.   Please note: these test may take anywhere between 2-4 hours to complete  PLEASE REPORT TO [facility] ENTRANCE  THE VOLUNTEERS AT THE FIRST DESK WILL DIRECT YOU WHERE TO GO  Date of Procedure:______Tues, 12/30__________________  Arrival Time for Procedure:____7:00am_________________  Instructions regarding medication:   __X__ : Hold diabetes medication morning of procedure:  METFORMIN  __x__:  Hold betablocker(s) night before procedure and morning of procedure:  METOPROLOL  PLEASE NOTIFY THE OFFICE AT LEAST 24 HOURS IN ADVANCE IF YOU ARE UNABLE TO KEEP YOUR APPOINTMENT.  [phone] AND  PLEASE NOTIFY NUCLEAR MEDICINE AT [facility] AT LEAST 24 HOURS IN ADVANCE IF YOU ARE UNABLE TO KEEP YOUR APPOINTMENT. [phone]  How to prepare for your Myoview test:  1. Do not eat or drink after midnight 2. No caffeine for 24 hours prior to test 3. No smoking 24 hours prior to test. 4. Your medication may be taken with water.  If your doctor stopped a medication because of this test, do  not take that medication. 5. Ladies, please do not wear dresses.  Skirts or pants are appropriate. Please wear a short sleeve shirt. 6. No perfume, cologne or lotion. 7. Wear comfortable walking shoes. No heels!

## 2013-01-11 ENCOUNTER — Emergency Department (HOSPITAL_COMMUNITY): Payer: Medicare Other

## 2013-01-11 ENCOUNTER — Inpatient Hospital Stay (HOSPITAL_COMMUNITY)
Admission: EM | Admit: 2013-01-11 | Discharge: 2013-01-14 | DRG: 309 | Disposition: A | Discharge status: Home / Self Care | Payer: Medicare Other | Attending: Cardiology | Admitting: Cardiology

## 2013-01-11 ENCOUNTER — Encounter (HOSPITAL_COMMUNITY): Payer: Self-pay | Admitting: Emergency Medicine

## 2013-01-11 ENCOUNTER — Telehealth: Payer: Self-pay | Admitting: Physician Assistant

## 2013-01-11 DIAGNOSIS — Z6832 Body mass index (BMI) 32.0-32.9, adult: Secondary | ICD-10-CM | POA: Diagnosis not present

## 2013-01-11 DIAGNOSIS — R0789 Other chest pain: Secondary | ICD-10-CM | POA: Diagnosis not present

## 2013-01-11 DIAGNOSIS — Z9851 Tubal ligation status: Secondary | ICD-10-CM

## 2013-01-11 DIAGNOSIS — Z91199 Patient's noncompliance with other medical treatment and regimen due to unspecified reason: Secondary | ICD-10-CM

## 2013-01-11 DIAGNOSIS — I999 Unspecified disorder of circulatory system: Secondary | ICD-10-CM | POA: Diagnosis present

## 2013-01-11 DIAGNOSIS — I4891 Unspecified atrial fibrillation: Secondary | ICD-10-CM | POA: Diagnosis not present

## 2013-01-11 DIAGNOSIS — E1169 Type 2 diabetes mellitus with other specified complication: Secondary | ICD-10-CM | POA: Diagnosis present

## 2013-01-11 DIAGNOSIS — Z880 Allergy status to penicillin: Secondary | ICD-10-CM | POA: Diagnosis not present

## 2013-01-11 DIAGNOSIS — Z7901 Long term (current) use of anticoagulants: Secondary | ICD-10-CM | POA: Diagnosis not present

## 2013-01-11 DIAGNOSIS — Z91013 Allergy to seafood: Secondary | ICD-10-CM | POA: Diagnosis not present

## 2013-01-11 DIAGNOSIS — Z87891 Personal history of nicotine dependence: Secondary | ICD-10-CM | POA: Diagnosis not present

## 2013-01-11 DIAGNOSIS — I509 Heart failure, unspecified: Secondary | ICD-10-CM | POA: Diagnosis not present

## 2013-01-11 DIAGNOSIS — I10 Essential (primary) hypertension: Secondary | ICD-10-CM | POA: Diagnosis present

## 2013-01-11 DIAGNOSIS — R000 Tachycardia, unspecified: Secondary | ICD-10-CM | POA: Diagnosis not present

## 2013-01-11 DIAGNOSIS — Z8673 Personal history of transient ischemic attack (TIA), and cerebral infarction without residual deficits: Secondary | ICD-10-CM | POA: Diagnosis present

## 2013-01-11 DIAGNOSIS — E785 Hyperlipidemia, unspecified: Secondary | ICD-10-CM | POA: Diagnosis present

## 2013-01-11 DIAGNOSIS — I495 Sick sinus syndrome: Secondary | ICD-10-CM | POA: Diagnosis present

## 2013-01-11 DIAGNOSIS — K219 Gastro-esophageal reflux disease without esophagitis: Secondary | ICD-10-CM | POA: Diagnosis present

## 2013-01-11 DIAGNOSIS — I639 Cerebral infarction, unspecified: Secondary | ICD-10-CM | POA: Diagnosis present

## 2013-01-11 DIAGNOSIS — Z833 Family history of diabetes mellitus: Secondary | ICD-10-CM | POA: Diagnosis not present

## 2013-01-11 DIAGNOSIS — Z950 Presence of cardiac pacemaker: Secondary | ICD-10-CM | POA: Diagnosis not present

## 2013-01-11 DIAGNOSIS — E1159 Type 2 diabetes mellitus with other circulatory complications: Secondary | ICD-10-CM | POA: Diagnosis present

## 2013-01-11 DIAGNOSIS — Z9119 Patient's noncompliance with other medical treatment and regimen: Secondary | ICD-10-CM | POA: Diagnosis not present

## 2013-01-11 DIAGNOSIS — I5032 Chronic diastolic (congestive) heart failure: Secondary | ICD-10-CM | POA: Diagnosis not present

## 2013-01-11 DIAGNOSIS — Z9089 Acquired absence of other organs: Secondary | ICD-10-CM | POA: Diagnosis not present

## 2013-01-11 DIAGNOSIS — J90 Pleural effusion, not elsewhere classified: Secondary | ICD-10-CM | POA: Diagnosis not present

## 2013-01-11 DIAGNOSIS — Z823 Family history of stroke: Secondary | ICD-10-CM

## 2013-01-11 DIAGNOSIS — Z888 Allergy status to other drugs, medicaments and biological substances status: Secondary | ICD-10-CM

## 2013-01-11 DIAGNOSIS — E669 Obesity, unspecified: Secondary | ICD-10-CM | POA: Diagnosis present

## 2013-01-11 DIAGNOSIS — J45909 Unspecified asthma, uncomplicated: Secondary | ICD-10-CM | POA: Diagnosis present

## 2013-01-11 DIAGNOSIS — Z79899 Other long term (current) drug therapy: Secondary | ICD-10-CM | POA: Diagnosis not present

## 2013-01-11 HISTORY — DX: Patient's other noncompliance with medication regimen for other reason: Z91.148

## 2013-01-11 HISTORY — DX: Patient's other noncompliance with medication regimen: Z91.14

## 2013-01-11 HISTORY — DX: Gastro-esophageal reflux disease without esophagitis: K21.9

## 2013-01-11 HISTORY — DX: Sick sinus syndrome: I49.5

## 2013-01-11 LAB — GLUCOSE, CAPILLARY
Glucose-Capillary: 155 mg/dL — ABNORMAL HIGH (ref 70–99)
Glucose-Capillary: 154 mg/dL — ABNORMAL HIGH (ref 70–99)

## 2013-01-11 LAB — LIPASE, BLOOD: Lipase: 41 U/L (ref 11–59)

## 2013-01-11 LAB — BASIC METABOLIC PANEL
Calcium: 10 mg/dL (ref 8.4–10.5)
GFR calc Af Amer: >90 mL/min (ref >90)
GFR calc non Af Amer: 86 mL/min — ABNORMAL LOW (ref >90)
Glucose, Bld: 157 mg/dL — ABNORMAL HIGH (ref 70–99)
Potassium: 7.3 mEq/L (ref 3.5–5.1)
Sodium: 134 mEq/L — ABNORMAL LOW (ref 135–145)

## 2013-01-11 LAB — POCT I-STAT TROPONIN I: Troponin i, poc: 0 ng/mL (ref 0.00–0.08)

## 2013-01-11 LAB — CBC
HCT: 49.5 % — ABNORMAL HIGH (ref 36.0–46.0)
Hemoglobin: 17.5 g/dL — ABNORMAL HIGH (ref 12.0–15.0)
MCH: 32.2 pg (ref 26.0–34.0)
MCHC: 35.4 g/dL (ref 30.0–36.0)
Platelets: 288 10*3/uL (ref 150–400)

## 2013-01-11 LAB — HEPATIC FUNCTION PANEL
ALT: 24 U/L (ref 0–35)
AST: 16 U/L (ref 0–37)
Bilirubin, Direct: <0.1 mg/dL (ref 0.0–0.3)
Total Bilirubin: 0.4 mg/dL (ref 0.3–1.2)

## 2013-01-11 LAB — TROPONIN I: Troponin I: <0.3 ng/mL (ref <0.30)

## 2013-01-11 LAB — PRO B NATRIURETIC PEPTIDE: Pro B Natriuretic peptide (BNP): 1513 pg/mL — ABNORMAL HIGH (ref 0–125)

## 2013-01-11 MED ORDER — NON FORMULARY: 80.0000 mg | Freq: Every day | Status: DC

## 2013-01-11 MED ORDER — FAMOTIDINE 20 MG PO TABS
20.0000 mg | ORAL_TABLET | ORAL | Status: DC | PRN
  Filled 2013-01-11: qty 1

## 2013-01-11 MED ORDER — DILTIAZEM LOAD VIA INFUSION
20.0000 mg | Freq: Once | INTRAVENOUS | Status: AC
  Administered 2013-01-11: 20 mg via INTRAVENOUS
  Filled 2013-01-11: qty 20

## 2013-01-11 MED ORDER — SODIUM CHLORIDE 0.9 % IV SOLN: 250.0000 mL | INTRAVENOUS | Status: DC | PRN

## 2013-01-11 MED ORDER — PANTOPRAZOLE SODIUM 40 MG PO TBEC
40.0000 mg | DELAYED_RELEASE_TABLET | Freq: Every day | ORAL | Status: DC
  Administered 2013-01-12 – 2013-01-14 (×3): 40 mg via ORAL
  Filled 2013-01-11 (×3): qty 1

## 2013-01-11 MED ORDER — DILTIAZEM HCL 100 MG IV SOLR
5.0000 mg/h | INTRAVENOUS | Status: DC
  Administered 2013-01-11: 5 mg/h via INTRAVENOUS

## 2013-01-11 MED ORDER — APIXABAN 5 MG PO TABS
5.0000 mg | ORAL_TABLET | Freq: Two times a day (BID) | ORAL | Status: DC
  Administered 2013-01-11 – 2013-01-14 (×6): 5 mg via ORAL
  Filled 2013-01-11 (×7): qty 1

## 2013-01-11 MED ORDER — LOSARTAN POTASSIUM 50 MG PO TABS
100.0000 mg | ORAL_TABLET | Freq: Every day | ORAL | Status: DC
  Administered 2013-01-11 – 2013-01-13 (×3): 100 mg via ORAL
  Filled 2013-01-11 (×4): qty 2

## 2013-01-11 MED ORDER — DILTIAZEM HCL 100 MG IV SOLR
5.0000 mg/h | INTRAVENOUS | Status: DC
  Administered 2013-01-12 – 2013-01-13 (×3): 5 mg/h via INTRAVENOUS
  Filled 2013-01-11 (×3): qty 100

## 2013-01-11 MED ORDER — ONDANSETRON HCL 4 MG/2ML IJ SOLN: 4.0000 mg | Freq: Four times a day (QID) | INTRAMUSCULAR | Status: DC | PRN

## 2013-01-11 MED ORDER — LOSARTAN POTASSIUM 50 MG PO TABS: 100.0000 mg | ORAL_TABLET | Freq: Every day | ORAL | Status: DC

## 2013-01-11 MED ORDER — METOPROLOL SUCCINATE ER 100 MG PO TB24
150.0000 mg | ORAL_TABLET | Freq: Every day | ORAL | Status: DC
  Filled 2013-01-11: qty 1

## 2013-01-11 MED ORDER — ACETAMINOPHEN 325 MG PO TABS: 650.0000 mg | ORAL_TABLET | ORAL | Status: DC | PRN

## 2013-01-11 MED ORDER — SODIUM CHLORIDE 0.9 % IJ SOLN: 3.0000 mL | INTRAMUSCULAR | Status: DC | PRN

## 2013-01-11 MED ORDER — SODIUM CHLORIDE 0.9 % IJ SOLN
3.0000 mL | Freq: Two times a day (BID) | INTRAMUSCULAR | Status: DC
  Administered 2013-01-13: 3 mL via INTRAVENOUS

## 2013-01-11 MED ORDER — PRAVASTATIN SODIUM 40 MG PO TABS
80.0000 mg | ORAL_TABLET | Freq: Every day | ORAL | Status: DC
  Administered 2013-01-11 – 2013-01-13 (×3): 80 mg via ORAL
  Filled 2013-01-11 (×4): qty 2

## 2013-01-11 MED ORDER — INSULIN ASPART 100 UNIT/ML ~~LOC~~ SOLN
0.0000 [IU] | Freq: Three times a day (TID) | SUBCUTANEOUS | Status: DC
Start: 2013-01-12 — End: 2013-01-14
  Administered 2013-01-12 (×2): 2 [IU] via SUBCUTANEOUS
  Administered 2013-01-13 – 2013-01-14 (×5): 1 [IU] via SUBCUTANEOUS

## 2013-01-11 NOTE — ED Notes (Signed)
Cardiology at the bedside to assess. Spoke with [employer] representative on phone, will come to ED to further evaluate patient. Vital signs stable. Pt remains in afib on cardiac monitor. Denies pain.

## 2013-01-11 NOTE — Progress Notes (Signed)
I was notified by [employer] Representative Huntley Dec) that pacemaker has been interrogated and was found to be functioning properly. Patient was in AFIB with intermittent RVR.  Gemma Payor, MD.

## 2013-01-11 NOTE — ED Notes (Signed)
Pt states she recently had pacemaker placed for atrial fibrillation last Monday. Now states she feels fatigued and nauseous. Also states she feels her heart racing. Denies chest pain. Respirations unlabored. Taking cardizem at home, husband states issues with rate control and pacemaker.  Pt is alert and oriented x4.

## 2013-01-11 NOTE — ED Provider Notes (Signed)
CSN: [id number]     Arrival date & time 01/11/13  1251 History   First MD Initiated Contact with Patient 01/11/13 1315     Chief Complaint  Patient presents with  . Palpitations  . Tachycardia   (Consider location/radiation/quality/duration/timing/severity/associated sxs/prior Treatment) Patient is a 67 y.o. female presenting with palpitations. The history is provided by the patient. No language interpreter was used.  Palpitations Palpitations quality:  Fast Associated symptoms: nausea   Associated symptoms: no chest pain, no shortness of breath and no vomiting   Associated symptoms comment:  She presents with counted pulse at home of greater than 130 at times, and as low as 50's at times, starting today. She denies any chest pain, SOB. She complains only of "not feeling well" and mild nausea without vomiting. She has a history of recent pacemaker implantation for tachy-brady syndrome by Dr. [name] one week ago.    Past Medical History  Diagnosis Date  . Allergy   . Asthma   . Type 2 diabetes mellitus   . GERD (gastroesophageal reflux disease)   . Hyperlipidemia   . Hypertension   . Kidney stones   . Rheumatic fever   . TMJ arthritis   . Ectopic pregnancy   . Hx of rheumatic fever 08/07/2011  . Atrial fibrillation     Refused Xarelto 09/2012 due to risk of bleeding  . Obesity   . CVA (cerebral vascular accident) 01/06/2013  . Cardiac pacemaker in situ 01/06/2013   Past Surgical History  Procedure Laterality Date  . Cholecystectomy    . Tubal ligation      ectopic preganacy   . Destruction trigeminal nerve via neurolytic agent  NOv @013     Gamma Knife   . Pacemaker insertion  12-30-12    MDT dual chamber pacemaker implanted by Dr [name] for tachy-brady syndrome   Family History  Problem Relation Age of Onset  . Stroke Mother   . Diabetes Mother    History  Substance Use Topics  . Smoking status: Former Smoker -- 2.00 packs/day    Types: Cigarettes    Quit date:  01/24/1968  . Smokeless tobacco: Never Used  . Alcohol Use: 0.6 oz/week    1 Glasses of wine per week   OB History   Grav Para Term Preterm Abortions TAB SAB Ect Mult Living                 Review of Systems  Constitutional: Negative for fever and chills.  HENT: Negative.   Respiratory: Negative.  Negative for shortness of breath.   Cardiovascular: Positive for palpitations. Negative for chest pain.  Gastrointestinal: Positive for nausea. Negative for vomiting.  Musculoskeletal: Negative.   Skin: Negative.   Neurological: Negative.     Allergies  Fish allergy; Penicillins; and Ace inhibitors  Home Medications   Current Outpatient Rx  Name  Route  Sig  Dispense  Refill  . apixaban (ELIQUIS) 5 MG TABS tablet   Oral   Take 1 tablet (5 mg total) by mouth 2 (two) times daily.   60 tablet   0   . diltiazem (CARDIZEM CD) 180 MG 24 hr capsule   Oral   Take 1 capsule (180 mg total) by mouth daily.   30 capsule   0   . glucose blood (ONE TOUCH ULTRA TEST) test strip      Pt checks blood sugar twice a day and as needed.250.00   100 each   11   . losartan (  COZAAR) 100 MG tablet      take 1 tablet by mouth once daily   30 tablet   5   . metFORMIN (GLUCOPHAGE XR) 500 MG 24 hr tablet   Oral   Take 1 tablet (500 mg total) by mouth daily with breakfast.   30 tablet   0   . metoprolol succinate (TOPROL-XL) 50 MG 24 hr tablet   Oral   Take 3 tablets (150 mg total) by mouth daily. Take with or immediately following a meal.   30 tablet   0   . ONETOUCH DELICA LANCETS MISC      Checking blood sugar twice a day and as needed. 250.00   100 each   11   . pantoprazole (PROTONIX) 40 MG tablet   Oral   Take 1 tablet (40 mg total) by mouth daily.   30 tablet   4   . pravastatin (PRAVACHOL) 80 MG tablet   Oral   Take 1 tablet (80 mg total) by mouth daily.   30 tablet   0    BP 141/92  Pulse 137  Temp(Src) 98.2 F (36.8 C) (Oral)  Resp 18  SpO2 96% Physical  Exam  Constitutional: She is oriented to person, place, and time. She appears well-developed and well-nourished. No distress.  HENT:  Head: Normocephalic.  Neck: Normal range of motion. Neck supple.  Cardiovascular: An irregularly irregular rhythm present. Tachycardia present.   Pulmonary/Chest: Effort normal and breath sounds normal.  Abdominal: Soft. Bowel sounds are normal. There is no tenderness. There is no rebound and no guarding.  Musculoskeletal: Normal range of motion. She exhibits no edema.  Neurological: She is alert and oriented to person, place, and time.  Skin: Skin is warm and dry. No rash noted.  Psychiatric: She has a normal mood and affect.    ED Course  Procedures (including critical care time) Labs Review Labs Reviewed  CBC  BASIC METABOLIC PANEL  PRO B NATRIURETIC PEPTIDE   Results for orders placed during the hospital encounter of 01/11/13  CBC      Result Value Range   WBC 10.1  4.0 - 10.5 K/uL   RBC 5.43 (*) 3.87 - 5.11 MIL/uL   Hemoglobin 17.5 (*) 12.0 - 15.0 g/dL   HCT 16.1 (*) 09.6 - 04.5 %   MCV 91.2  78.0 - 100.0 fL   MCH 32.2  26.0 - 34.0 pg   MCHC 35.4  30.0 - 36.0 g/dL   RDW 40.9  81.1 - 91.4 %   Platelets 288  150 - 400 K/uL  BASIC METABOLIC PANEL      Result Value Range   Sodium 134 (*) 135 - 145 mEq/L   Potassium PENDING  3.5 - 5.1 mEq/L   Chloride 100  96 - 112 mEq/L   CO2 19  19 - 32 mEq/L   Glucose, Bld 157 (*) 70 - 99 mg/dL   BUN 14  6 - 23 mg/dL   Creatinine, Ser 7.82  0.50 - 1.10 mg/dL   Calcium 95.6  8.4 - 21.3 mg/dL   GFR calc non Af Amer 86 (*) >90 mL/min   GFR calc Af Amer >90  >90 mL/min  PRO B NATRIURETIC PEPTIDE      Result Value Range   Pro B Natriuretic peptide (BNP) 1513.0 (*) 0 - 125 pg/mL  POCT I-STAT TROPONIN I      Result Value Range   Troponin i, poc 0.00  0.00 - 0.08 ng/mL  Comment 3             Imaging Review No results found.  EKG Interpretation    Date/Time:  Saturday January 11 2013 13:11:10  EST Ventricular Rate:  145 PR Interval:    QRS Duration: 79 QT Interval:  330 QTC Calculation: 513 R Axis:   31 Text Interpretation:  Atrial fibrillation with rapid ventricular response No significant change since last tracing Confirmed by STEINL  MD, KEVIN ([id number]) on 01/11/2013 1:23:49 PM            MDM  No diagnosis found. 1. Atrial fibrillation with RVR  Discussed with cardiology who will see in ED. Interrogation of pacemaker being performed.   IV cardizem, bolus, then infusion started. Re-evaluation: HR 88-92. She feels well. Blood pressure 136 systolic.     Arnoldo Hooker, PA-C 01/11/13 1458

## 2013-01-11 NOTE — ED Notes (Addendum)
Pt reports having pacemaker inserted almost one week ago and today having palpitations and irregular HR, hx of afib. ekg done at triage, airway intact. afib with rvr at triage.

## 2013-01-11 NOTE — Telephone Encounter (Signed)
Pt's husband called answering service on Saturday. His wife is having wide swings of HR this morning/afternoon. When tachy, HR 120-140s, when brady, HR in the 50's. He was concerned because he believes the pacer was set to a low rate of 60. She c/o nausea but no CP, SOB, near syncope or syncope. I advised they proceed to ER and he said they were already on their way. Dayna Dunn PA-C

## 2013-01-11 NOTE — H&P (Signed)
History and Physical  Patient ID: Nina Carpenter MRN: [id number], DOB: 21-Oct-1945 Date of Encounter: 01/11/2013, 3:24 PM Primary Physician: [name], MD Primary Cardiologist: Katrina Stack [name]  Chief Complaint: nausea, elevated HR Reason for Admission: AF-RVR  HPI: [name] is a 67 y/o F with history of HTN, HL, PAF dx 09/2012, recent CVA earlier this month complicated by tachybrady syndrome s/p pacemaker who presented to [facility] with recurrent tachycardia. Her AF was diagnosed in 09/2012, unclear chronicity at that time, and she initially refused anticoagulation due to bleeding risk (friend had died after significant bleed on blood thinners). She had spontaneously converted to NSR by the time of her 10/2012 followup. She came to the hospital 12/28/12 with confusion and speech deficit and was diagnosed with L MCA CVA. She was back in AF-RVR but also with bradycardia pauses >5 sec thus [employer] PPM was placed. She agreed to going on Eliquis. EF 45-50% by echo during that admission. She was readmitted with AF-RVR 12/11-12/12 at which time metoprolol was uptitrated. She saw Dr. [name] in the office on 12/18 at which time she reported paroysmal symptoms of AF. Dr. [name] planned to initiate flecainide with upcoming GXT myoview. The patient planned to start this medicine on 01/17/13. She endorses compliance with med regimen otherwise.  Since her office visit, she's had on/off symptoms. However, this morning she felt quite nauseated. She doesn't traditionally get palpitations with her AF, but generally feels "bad" as she did today. She has been eating and drinking OK. She denies CP, SOB, vomiting, near-syncope or syncope. Her husband checked her HR on their monitor and found she was ranging from readings of 120-140 and down to the 50s. Her pacer is set at a lower rate of 60 so he became very concerned and brought her to the ER. [employer] interrogation pending to confirm   (rep says she  will be about an hour as [facility] was not functional in the ED). HR in the ED was 120-150s. She was given IV diltiazem and started on drip with improvement in HR and nausea. She currently denies any symptoms. Initial troponin negative, Hgb 17.5 (up from 14.7, Na 134, K initially 7.3 (hemolyzed) with repeat of 4.8. CXR with tiny amount of left pleural fluid but no pulm vasc congestion or pulm edema.   She does not exercise, but frequently gardens without any exertional CP or dyspnea. She has chronic indigestion for several years that is unchanged recently. This is relieved with Pepcid and ginger ale. No other recent symptoms. Denies excessive potassium intake or OTC supplements including decongestants.  Past Medical History  Diagnosis Date  . Allergy   . Asthma   . Type 2 diabetes mellitus   . GERD (gastroesophageal reflux disease)   . Hyperlipidemia   . Hypertension   . Kidney stones   . Rheumatic fever   . TMJ arthritis   . Hx of rheumatic fever   . PAF (paroxysmal atrial fibrillation)     a. Dx 09/2012, chronicity unclear at that time. Initially refused Xarelto due to risk of bleeding. b. Sustained CVA 12/2012 - agreeable to taking Eliquis. Tachybrady syndrome during that admission s/p [employer] PPM implantation.  . Obesity   . CVA (cerebral vascular accident)     a. Thromboemoblic L MCA CVA 12/2012 with no residual deficit except mild occasional word finding. b. 1-39% RICA/LICA by dopplers 12/29/12.  . Tachy-brady syndrome     a. AF RVR with pauses >5sec requiring [employer]  PPM 12/2012.  . H/O medication noncompliance     a. Per PCP note 01/06/13: Previously refused anything greater than aspirin, refused treatment for DM, and often stopped hypertensive meds and lipid meds.  . Diastolic CHF     a. Felt related to AF. EF 45-50% by echo 12/2012.  . LV dysfunction     a. EF 45-50% by echo 12/29/12.     Most Recent Cardiac Studies: 2D Echo 12/29/12 Left ventricle: The cavity size  was normal. Wall thickness was normal. Systolic function was mildly reduced. The estimated ejection fraction was in the range of 45% to 50%. Wall motion was normal; there were no regional wall motion abnormalities.    Surgical History:  Past Surgical History  Procedure Laterality Date  . Cholecystectomy    . Tubal ligation      ectopic preganacy   . Destruction trigeminal nerve via neurolytic agent  NOv @013     Gamma Knife   . Pacemaker insertion  12-30-12    MDT dual chamber pacemaker implanted by Dr [name] for tachy-brady syndrome     Home Meds: Prior to Admission medications   Medication Sig Start Date End Date Taking? Authorizing Provider  apixaban (ELIQUIS) 5 MG TABS tablet Take 1 tablet (5 mg total) by mouth 2 (two) times daily. 01/03/13  Yes Lonia Blood, MD  diltiazem (CARDIZEM CD) 180 MG 24 hr capsule Take 1 capsule (180 mg total) by mouth daily. 01/01/13  Yes Lonia Blood, MD  famotidine (PEPCID) 20 MG tablet Take 20 mg by mouth as needed for indigestion.   Yes Historical Provider, MD  losartan (COZAAR) 100 MG tablet Take 100 mg by mouth daily.   Yes Historical Provider, MD  metFORMIN (GLUCOPHAGE XR) 500 MG 24 hr tablet Take 1 tablet (500 mg total) by mouth daily with breakfast. 01/06/13  Yes [name], MD  metoprolol succinate (TOPROL-XL) 50 MG 24 hr tablet Take 3 tablets (150 mg total) by mouth daily. Take with or immediately following a meal. 01/03/13  Yes Brooke O Edmisten, PA-C  pantoprazole (PROTONIX) 40 MG tablet Take 1 tablet (40 mg total) by mouth daily. 01/03/13  Yes Brooke O Edmisten, PA-C  pravastatin (PRAVACHOL) 80 MG tablet Take 1 tablet (80 mg total) by mouth daily. 01/01/13  Yes Lonia Blood, MD    Allergies:  Allergies  Allergen Reactions  . Crestor [Rosuvastatin]     myalgia  . Fish Allergy Swelling    Shell fish  . Lipitor [Atorvastatin]     myalgias  . Penicillins Hives and Itching  . Ace Inhibitors Cough    History   Social  History  . Marital Status: Married    Spouse Name: N/A    Number of Children: 2  . Years of Education: N/A   Occupational History  . cosmotologist    Social History Main Topics  . Smoking status: Former Smoker -- 2.00 packs/day    Types: Cigarettes    Quit date: 01/24/1968  . Smokeless tobacco: Never Used  . Alcohol Use: 0.6 oz/week    1 Glasses of wine per week  . Drug Use: No  . Sexual Activity: Not on file   Other Topics Concern  . Not on file   Social History Narrative  . No narrative on file     Family History  Problem Relation Age of Onset  . Stroke Mother   . Diabetes Mother     Review of Systems: General: negative for chills, fever Cardiovascular:  negative for chest pain, orthopnea, palpitations, paroxysmal nocturnal dyspnea, shortness of breath or dyspnea on exertion. Occasional edema after standing on feet all day but not today. Dermatological: negative for rash Respiratory: negative for cough or wheezing Urologic: negative for hematuria Abdominal: negative for nausea, vomiting, diarrhea, bright red blood per rectum, melena, or hematemesis Neurologic: negative for syncope, or dizziness All other systems reviewed and are otherwise negative except as noted above.  Labs:   Lab Results  Component Value Date   WBC 10.1 01/11/2013   HGB 17.5* 01/11/2013   HCT 49.5* 01/11/2013   MCV 91.2 01/11/2013   PLT 288 01/11/2013    Recent Labs Lab 01/11/13 1325  NA 134*  K 7.3*  CL 100  CO2 19  BUN 14  CREATININE 0.74  CALCIUM 10.0  GLUCOSE 157*    Lab Results  Component Value Date   CHOL 227* 12/28/2012   HDL 50 12/28/2012   LDLCALC 154* 12/28/2012   TRIG 114 12/28/2012    Radiology/Studies:  Chest Portable 1 View 01/11/2013   CLINICAL DATA:  Palpitations and tachycardia  EXAM: PORTABLE CHEST - 1 VIEW  COMPARISON:  Two-view chest x-ray dated December 31, 2012.  FINDINGS: The lungs are well-expanded. There remains a small amount of blunting of the left  lateral costophrenic angle. There is no alveolar infiltrate. The cardiopericardial silhouette is mildly enlarged. The pulmonary vascularity is not engorged. The permanent pacemaker is unchanged in appearance.  IMPRESSION: 1. There is no evidence of pulmonary vascular congestion or pulmonary edema. 2. A tiny amount of pleural fluid persists blunting the left lateral costophrenic angle.   Electronically Signed   By: David  [place]   On: 01/11/2013 14:18   EKG: AF RVR 145bpm nonspecific ST-T changes  Physical Exam: Blood pressure 125/67, pulse 84, temperature 98.2 F (36.8 C), temperature source Oral, resp. rate 18, SpO2 98.00%. General: Well developed, well nourished WF in no acute distress. Head: Normocephalic, atraumatic, sclera non-icteric, no xanthomas, nares are without discharge.  Neck: Negative for carotid bruits. JVD not elevated. Lungs: Clear bilaterally to auscultation without wheezes, rales, or rhonchi. Breathing is unlabored. Heart: Irregularly irregular, rate controlled, with S1 S2. No murmurs, rubs, or gallops appreciated. Abdomen: Soft, non-tender, non-distended with normoactive bowel sounds. No hepatomegaly. No rebound/guarding. No obvious abdominal masses. Msk:  Strength and tone appear normal for age. Extremities: No clubbing or cyanosis. No edema.  Distal pedal pulses are 2+ and equal bilaterally. Neuro: Alert and oriented X 3. No focal deficit. No facial asymmetry. Moves all extremities spontaneously. Psych:  Responds to questions appropriately with a normal affect.    ASSESSMENT AND PLAN:  1. Paroxysmal atrial fibrillation with RVR  2. Tachybrady syndrome s/p PPM 3. Recent CVA 12/2012 4. HTN, controlled 5. Diabetes mellitus 6. Chronic diastolic CHF, appearing euvolemic, EF 45-50% 7. GERD  Will admit, continue on IV diltiazem to see if she will spontaneously convert back to NSR. Otherwise continue home regimen including beta blocker, Eliquis. She endorses med compliance.  Likely needs antiarrhythmic at this point. Not sure flecainide is the best choice with her LV dysfunction and history of diastolic CHF. Will ask EP to review tomorrow. Pacemaker interrogation is also pending just to make sure it is functioning normally given husband's report of HR going lower than what he thought was the lower rate limit. Continue GERD rx.  Signed, Ronie Spies PA-C 01/11/2013, 3:24 PM   Patient seen and discussed with PA Dunn.Patient presented with afib with RVR, rates controlled with IV  dilitiazem. She is fairly aggressive rate control regimen with continued, per notes she has been considered for rhythm strategy as an outpatient. Will ask EP to see her tomorrow as an inpatient, will continue current rate control and anticoagulation.   Dina Rich MD

## 2013-01-12 DIAGNOSIS — I4891 Unspecified atrial fibrillation: Secondary | ICD-10-CM | POA: Diagnosis not present

## 2013-01-12 DIAGNOSIS — I5032 Chronic diastolic (congestive) heart failure: Secondary | ICD-10-CM | POA: Diagnosis not present

## 2013-01-12 DIAGNOSIS — I495 Sick sinus syndrome: Secondary | ICD-10-CM | POA: Diagnosis not present

## 2013-01-12 DIAGNOSIS — I509 Heart failure, unspecified: Secondary | ICD-10-CM | POA: Diagnosis not present

## 2013-01-12 LAB — CBC
MCH: 31.6 pg (ref 26.0–34.0)
MCV: 92.3 fL (ref 78.0–100.0)
Platelets: 245 10*3/uL (ref 150–400)
RBC: 4.94 MIL/uL (ref 3.87–5.11)
RDW: 12.6 % (ref 11.5–15.5)

## 2013-01-12 LAB — BASIC METABOLIC PANEL
BUN: 12 mg/dL (ref 6–23)
Calcium: 9.3 mg/dL (ref 8.4–10.5)
GFR calc non Af Amer: 75 mL/min — ABNORMAL LOW (ref >90)
Glucose, Bld: 148 mg/dL — ABNORMAL HIGH (ref 70–99)
Sodium: 139 mEq/L (ref 135–145)

## 2013-01-12 LAB — GLUCOSE, CAPILLARY
Glucose-Capillary: 155 mg/dL — ABNORMAL HIGH (ref 70–99)
Glucose-Capillary: 113 mg/dL — ABNORMAL HIGH (ref 70–99)

## 2013-01-12 MED ORDER — SOTALOL HCL 80 MG PO TABS
160.0000 mg | ORAL_TABLET | Freq: Two times a day (BID) | ORAL | Status: DC
  Administered 2013-01-12 – 2013-01-14 (×5): 160 mg via ORAL
  Filled 2013-01-12 (×6): qty 2

## 2013-01-12 NOTE — ED Provider Notes (Signed)
Medical screening examination/treatment/procedure(s) were conducted as a shared visit with non-physician practitioner(s) and myself.  I personally evaluated the patient during the encounter.  EKG Interpretation    Date/Time:  Saturday January 11 2013 13:11:10 EST Ventricular Rate:  145 PR Interval:    QRS Duration: 79 QT Interval:  330 QTC Calculation: 513 R Axis:   31 Text Interpretation:  Atrial fibrillation with rapid ventricular response No significant change since last tracing Confirmed by Gaia Gullikson  MD, Lothar Prehn ([id number]) on 01/11/2013 1:23:49 PM            Pt with hx afib. Presents w palpitations, chest tightness, sob, associated w rapid afib. States in and out of afib w rvr last several days, now constant. Had flecainide called in by cardiologist for same a couple days ago but hasnt yet started that rx.   Pt w rapid afib, hr 160's.    Continuous pulse ox and monitor. O2. Iv. Labs. Cxr. Ecg.  Pt given cardizem iv bolus and cardizem gtt.    w iv gtt, improved rate. Cardiology consulted.  Recheck pt, no cp or sob.  CRITICAL CARE Performed by: Suzi Roots Total critical care time: 35 Critical care time was exclusive of separately billable procedures and treating other patients. Critical care was necessary to treat or prevent imminent or life-threatening deterioration. Critical care was time spent personally by me on the following activities: development of treatment plan with patient and/or surrogate as well as nursing, discussions with consultants, evaluation of patient's response to treatment, examination of patient, obtaining history from patient or surrogate, ordering and performing treatments and interventions, ordering and review of laboratory studies, ordering and review of radiographic studies, pulse oximetry and re-evaluation of patient's condition.   Suzi Roots, MD 01/12/13 [phone]

## 2013-01-12 NOTE — Progress Notes (Signed)
Dr. Ladona Ridgel updated with Pt rhythm currently at A paced/ NSR  In the 60's. Cardizem drip decreased to 5 cc/hr.  EKG done per protocol for EKG change/s. MD aware and order to keep Cardizem at 5cc/hr.

## 2013-01-12 NOTE — Progress Notes (Signed)
Patient ID: Nina Carpenter, female   DOB: Feb 13, 1945, 67 y.o.   MRN: [id number] Subjective:  Denies chest pain or sob, still with palpitations  Objective:  Vital Signs in the last 24 hours: Temp:  [97.8 F (36.6 C)-98.5 F (36.9 C)] 97.8 F (36.6 C) (12/21 0500) Pulse Rate:  [70-137] 79 (12/21 0500) Resp:  [18-26] 18 (12/21 0500) BP: (118-152)/(67-106) 132/76 mmHg (12/21 0500) SpO2:  [96 %-98 %] 98 % (12/21 0500) Weight:  [185 lb 3 oz (84 kg)] 185 lb 3 oz (84 kg) (12/20 1828)  Intake/Output from previous day:   Intake/Output from this shift:    Physical Exam: Well appearing middle aged woman, NAD HEENT: Unremarkable Neck:  7 cm JVD, no thyromegally Back:  No CVA tenderness Lungs:  Clear except for scattered basilar rales. No wheezes HEART:  Regular rate rhythm, no murmurs, no rubs, no clicks Abd:  obese, positive bowel sounds, no organomegally, no rebound, no guarding Ext:  2 plus pulses, no edema, no cyanosis, no clubbing Skin:  No rashes no nodules Neuro:  CN II through XII intact, motor grossly intact  Lab Results:  Recent Labs  01/11/13 1325 01/12/13 0718  WBC 10.1 8.3  HGB 17.5* 15.6*  PLT 288 245    Recent Labs  01/11/13 1325 01/11/13 1516  NA 134*  --   K 7.3* 4.8  CL 100  --   CO2 19  --   GLUCOSE 157*  --   BUN 14  --   CREATININE 0.74  --     Recent Labs  01/12/13 0025 01/12/13 0718  TROPONINI <0.30 <0.30   Hepatic Function Panel  Recent Labs  01/11/13 1900  PROT 6.6  ALBUMIN 3.7  AST 16  ALT 24  ALKPHOS 78  BILITOT 0.4  BILIDIR <0.1  IBILI NOT CALCULATED   No results found for this basename: CHOL,  in the last 72 hours No results found for this basename: PROTIME,  in the last 72 hours  Imaging: Dg Chest Portable 1 View  01/11/2013   CLINICAL DATA:  Palpitations and tachycardia  EXAM: PORTABLE CHEST - 1 VIEW  COMPARISON:  Two-view chest x-ray dated December 31, 2012.  FINDINGS: The lungs are well-expanded. There remains a  small amount of blunting of the left lateral costophrenic angle. There is no alveolar infiltrate. The cardiopericardial silhouette is mildly enlarged. The pulmonary vascularity is not engorged. The permanent pacemaker is unchanged in appearance.  IMPRESSION: 1. There is no evidence of pulmonary vascular congestion or pulmonary edema. 2. A tiny amount of pleural fluid persists blunting the left lateral costophrenic angle.   Electronically Signed   By: David  [place]   On: 01/11/2013 14:18    Cardiac Studies: Tele - atrial fibriillation with a RVR Assessment/Plan:  1. Atrial fib with a RVR despite high dose AV nodal blocking drugs 2. S/p PPM secondary to long post termination pauses 3. S/p stroke as patient initially refused anti-coagulation when atrial fib was discovered 4. HTN 5. DM 6. Chronic anti-coagulation on Eliquis 7. Mild LV dysfunction by echo Rec: With all of above, will start sotalol 160 mg twice daily and follow QT interval and QRS duration while in hospital. Hopefully she will convert to NSR. If not will consider DCCV on Tuesday.  Gregg Taylor,M.D.  LOS: 1 day    Lewayne Bunting 01/12/2013, 9:22 AM

## 2013-01-13 DIAGNOSIS — I509 Heart failure, unspecified: Secondary | ICD-10-CM | POA: Diagnosis not present

## 2013-01-13 DIAGNOSIS — I4891 Unspecified atrial fibrillation: Secondary | ICD-10-CM | POA: Diagnosis not present

## 2013-01-13 DIAGNOSIS — I495 Sick sinus syndrome: Secondary | ICD-10-CM | POA: Diagnosis not present

## 2013-01-13 DIAGNOSIS — I5032 Chronic diastolic (congestive) heart failure: Secondary | ICD-10-CM | POA: Diagnosis not present

## 2013-01-13 LAB — GLUCOSE, CAPILLARY: Glucose-Capillary: 126 mg/dL — ABNORMAL HIGH (ref 70–99)

## 2013-01-13 MED ORDER — OFF THE BEAT BOOK
Freq: Once | Status: AC
  Administered 2013-01-13: 12:00:00
  Filled 2013-01-13: qty 1

## 2013-01-13 NOTE — Plan of Care (Signed)
Problem: Discharge Progression Outcomes Goal: INR monitor plan established Outcome: Not Applicable Date Met:  01/13/13 Pt on Eliquis

## 2013-01-13 NOTE — Progress Notes (Signed)
Patient: Nina Carpenter Date of Encounter: 01/13/2013, 9:17 AM Admit date: 01/11/2013     Subjective  Ms. Bahena denies any complaints. She states, "I'm feeling much better." She denies CP, SOB or palpitations.   Objective  Physical Exam: Vitals: BP 118/50  Pulse 58  Temp(Src) 98.2 F (36.8 C) (Oral)  Resp 16  Ht 5\' 3"  (1.6 m)  Wt 185 lb 3 oz (84 kg)  BMI 32.81 kg/m2  SpO2 98% General: Well developed, well appearing 67 year old female in no acute distress. Neck: Supple. JVD not elevated. Lungs: Clear bilaterally to auscultation without wheezes, rales, or rhonchi. Breathing is unlabored. Heart: RRR S1 S2 without murmurs, rubs, or gallops.  Abdomen: Soft, non-distended. Extremities: No clubbing or cyanosis. No edema.  Distal pedal pulses are 2+ and equal bilaterally. Neuro: Alert and oriented X 3. Moves all extremities spontaneously. No focal deficits. Skin: Left upper chest / implant site intact and well healing.   Intake/Output: No intake or output data in the 24 hours ending 01/13/13 0917  Inpatient Medications:  . apixaban  5 mg Oral BID  . insulin aspart  0-9 Units Subcutaneous TID WC  . losartan  100 mg Oral Daily  . pantoprazole  40 mg Oral Daily  . pravastatin  80 mg Oral q1800  . sodium chloride  3 mL Intravenous Q12H  . sotalol  160 mg Oral Q12H   . diltiazem (CARDIZEM) infusion 5 mg/hr (01/12/13 1857)    Labs:  Recent Labs  01/11/13 1325 01/11/13 1516 01/11/13 1900 01/12/13 0718  NA 134*  --   --  139  K 7.3* 4.8  --  4.1  CL 100  --   --  103  CO2 19  --   --  20  GLUCOSE 157*  --   --  148*  BUN 14  --   --  12  CREATININE 0.74  --   --  0.80  CALCIUM 10.0  --   --  9.3  MG  --   --  1.8  --     Recent Labs  01/11/13 1900  AST 16  ALT 24  ALKPHOS 78  BILITOT 0.4  PROT 6.6  ALBUMIN 3.7    Recent Labs  01/11/13 1900  LIPASE 41    Recent Labs  01/11/13 1325 01/12/13 0718  WBC 10.1 8.3  HGB 17.5* 15.6*  HCT 49.5* 45.6    MCV 91.2 92.3  PLT 288 245    Recent Labs  01/11/13 1900 01/12/13 0025 01/12/13 0718  TROPONINI <0.30 <0.30 <0.30    Radiology/Studies: Dg Chest Portable 1 View 01/11/2013   CLINICAL DATA:  Palpitations and tachycardia  EXAM: PORTABLE CHEST - 1 VIEW  COMPARISON:  Two-view chest x-ray dated December 31, 2012.  FINDINGS: The lungs are well-expanded. There remains a small amount of blunting of the left lateral costophrenic angle. There is no alveolar infiltrate. The cardiopericardial silhouette is mildly enlarged. The pulmonary vascularity is not engorged. The permanent pacemaker is unchanged in appearance.   IMPRESSION: 1. There is no evidence of pulmonary vascular congestion or pulmonary edema. 2. A tiny amount of pleural fluid persists blunting the left lateral costophrenic angle.    Electronically Signed   By: David  [place]   On: 01/11/2013 14:18   Echocardiogram: Study Conclusions Left ventricle: The cavity size was normal. Wall thickness was normal. Systolic function was mildly reduced. The estimated ejection fraction was in the range of 45% to 50%.  Wall motion was normal; there were no regional wall motion abnormalities.  Telemetry: SR   Assessment and Plan  1. AF w/RVR - now maintaining SR on sotalol - stop IV diltiazem - repeat ECG this AM and if QT stable, continue current dose - continue Eliquis for stroke prevention 2. Tachy-brady syndrome s/p recent PPM - implant site intact and well healing 3. Mild LV dysfunction, EF 45-50% 4. Recent CVA 5. HTN  Dr. Ladona Ridgel to see Signed, Exie Parody  EP Attending  Patient seen and examined. She has returned to NSR on Sotalol. Will follow QT interval, which is currently borderline around 480 ms. If over 500, would reduce dose of Sotalol. Will plan for discharge home tomorrow morning if maintaining NSR and QT ok.  Leonia Reeves.D.

## 2013-01-14 ENCOUNTER — Encounter (HOSPITAL_COMMUNITY): Payer: Self-pay | Admitting: Nurse Practitioner

## 2013-01-14 DIAGNOSIS — I509 Heart failure, unspecified: Secondary | ICD-10-CM | POA: Diagnosis not present

## 2013-01-14 DIAGNOSIS — I5032 Chronic diastolic (congestive) heart failure: Secondary | ICD-10-CM | POA: Diagnosis not present

## 2013-01-14 DIAGNOSIS — I4891 Unspecified atrial fibrillation: Secondary | ICD-10-CM | POA: Diagnosis not present

## 2013-01-14 DIAGNOSIS — I495 Sick sinus syndrome: Secondary | ICD-10-CM | POA: Diagnosis not present

## 2013-01-14 LAB — GLUCOSE, CAPILLARY: Glucose-Capillary: 140 mg/dL — ABNORMAL HIGH (ref 70–99)

## 2013-01-14 MED ORDER — SOTALOL HCL 160 MG PO TABS: 160.0000 mg | ORAL_TABLET | Freq: Two times a day (BID) | ORAL | Status: DC

## 2013-01-14 NOTE — Progress Notes (Addendum)
Patient: Nina Carpenter Date of Encounter: 01/14/2013, 10:39 AM Admit date: 01/11/2013     Subjective  Ms. Catalina denies any complaints. She states, "I'm feeling much better." She denies CP, SOB or palpitations.  Converted to NSR on Sotalol.    Objective  Physical Exam: Vitals: BP 145/62  Pulse 65  Temp(Src) 98.7 F (37.1 C) (Oral)  Resp 18  Ht 5\' 3"  (1.6 m)  Wt 183 lb 11.2 oz (83.326 kg)  BMI 32.55 kg/m2  SpO2 96% General: Well developed, well appearing 67 year old female in no acute distress. Neck: Supple. JVD not elevated. Lungs: Clear bilaterally to auscultation without wheezes, rales, or rhonchi. Breathing is unlabored. Heart: RR S1 S2 without murmurs, rubs, Abdomen: Soft, non-distended. Extremities: No clubbing or cyanosis. No edema.  Distal pedal pulses are 2+ and equal bilaterally. Neuro: Alert and oriented X 3. Moves all extremities spontaneously. No focal deficits. Skin: Left upper chest / implant site intact and well healing.   Intake/Output: No intake or output data in the 24 hours ending 01/14/13 1039  Inpatient Medications:  . apixaban  5 mg Oral BID  . insulin aspart  0-9 Units Subcutaneous TID WC  . losartan  100 mg Oral Daily  . pantoprazole  40 mg Oral Daily  . pravastatin  80 mg Oral q1800  . sodium chloride  3 mL Intravenous Q12H  . sotalol  160 mg Oral Q12H      Labs:  Recent Labs  01/11/13 1325 01/11/13 1516 01/11/13 1900 01/12/13 0718  NA 134*  --   --  139  K 7.3* 4.8  --  4.1  CL 100  --   --  103  CO2 19  --   --  20  GLUCOSE 157*  --   --  148*  BUN 14  --   --  12  CREATININE 0.74  --   --  0.80  CALCIUM 10.0  --   --  9.3  MG  --   --  1.8  --     Recent Labs  01/11/13 1900  AST 16  ALT 24  ALKPHOS 78  BILITOT 0.4  PROT 6.6  ALBUMIN 3.7    Recent Labs  01/11/13 1900  LIPASE 41    Recent Labs  01/11/13 1325 01/12/13 0718  WBC 10.1 8.3  HGB 17.5* 15.6*  HCT 49.5* 45.6  MCV 91.2 92.3  PLT 288 245     Recent Labs  01/11/13 1900 01/12/13 0025 01/12/13 0718  TROPONINI <0.30 <0.30 <0.30    Radiology/Studies: Dg Chest Portable 1 View 01/11/2013   CLINICAL DATA:  Palpitations and tachycardia  EXAM: PORTABLE CHEST - 1 VIEW  COMPARISON:  Two-view chest x-ray dated December 31, 2012.  FINDINGS: The lungs are well-expanded. There remains a small amount of blunting of the left lateral costophrenic angle. There is no alveolar infiltrate. The cardiopericardial silhouette is mildly enlarged. The pulmonary vascularity is not engorged. The permanent pacemaker is unchanged in appearance.   IMPRESSION: 1. There is no evidence of pulmonary vascular congestion or pulmonary edema. 2. A tiny amount of pleural fluid persists blunting the left lateral costophrenic angle.    Electronically Signed   By: David  [place]   On: 01/11/2013 14:18   Echocardiogram: Study Conclusions Left ventricle: The cavity size was normal. Wall thickness was normal. Systolic function was mildly reduced. The estimated ejection fraction was in the range of 45% to 50%. Wall motion was normal; there  were no regional wall motion abnormalities.  Telemetry: SR   Assessment and Plan  1. AF w/RVR - now maintaining SR on sotalol - repeat ECG this AM and if QT stable, continue current dose - continue Eliquis for stroke prevention Will be able to go home today if QTc remains <  500   2. Tachy-brady syndrome s/p recent PPM - implant site intact and well healing  3. Mild LV dysfunction, EF 45-50% 4. Recent CVA 5. HTN-  Cont. Losartan

## 2013-01-14 NOTE — Discharge Summary (Signed)
Discharge Summary   Patient ID: Nina Carpenter,  MRN: [id number], DOB/AGE: Oct 05, 1945 67 y.o.  Admit date: 01/11/2013 Discharge date: 01/14/2013  Primary Care Provider: Karleen Hampshire [name] Primary Cardiologist: Odessa Fleming, MD   Discharge Diagnoses Principal Problem:   Atrial fibrillation with RVR  **Sotalol initiated this admission with conversion to sinus rhythm.  Active Problems:   Tachy-brady syndrome  **s/p Medtronic PPM earlier this month.   Type 2 diabetes mellitus with vascular disease   Cardiac pacemaker in situ   HYPERLIPIDEMIA   HYPERTENSION   GERD   CVA (cerebral vascular accident)  Allergies Allergies  Allergen Reactions  . Crestor [Rosuvastatin]     myalgia  . Fish Allergy Swelling    Shell fish  . Lipitor [Atorvastatin]     myalgias  . Penicillins Hives and Itching  . Ace Inhibitors Cough   Procedures  None  History of Present Illness  67 y/o female with a h/o PAF diagnosed in September of 2014. Initially she refused anticoagulation and had converted to sinus rhythm by the time she followed-up in clinic in 10/2012.  Unfortunately, she was admitted to [facility] on 12/28/2012 with confusion and speech deficit and was diagnosed with L MCA stroke.  She was back in afib with rvr but was also noted to have > 5 second pauses.  EP was consulted and a Medtronic PPM was placed.  She was agreeable to initiate Eliquis therapy prior to discharge.  She followed up with EP on 12/18, at which time she reported more symptomatic afib and decision was made to initiate flecainide and f/u with an exercise treadmill test.  This was scheduled for 12/26.  Unfortunately, on the morning of 12/20, she developed nausea and generalized malaise.  Her husband checked her HR and noted it to be in the 120-140 range and brought her to the ED.  There, she was in afib with RVR.  She was treated with IV diltiazem with rate reduction and symptomatic improvement.  She was admitted for further  evaluation.  Hospital Course  Following admission, she remained rate controlled.  She was seen by electrophysiology and decision was made to initiate sotalol therapy at 160 mg bid.  Following her first dose on 12/21, she converted to sinus rhythm.  She has maintained sinus rhythm since and her QTc has remained stable.  She will be discharged home today in good condition.  Discharge Vitals Blood pressure 136/58, pulse 64, temperature 98.3 F (36.8 C), temperature source Oral, resp. rate 17, height 5\' 3"  (1.6 m), weight 183 lb 11.2 oz (83.326 kg), SpO2 97.00%.  Filed Weights   01/11/13 1828 01/14/13 0558  Weight: 185 lb 3 oz (84 kg) 183 lb 11.2 oz (83.326 kg)    Labs  CBC  Recent Labs  01/11/13 1325 01/12/13 0718  WBC 10.1 8.3  HGB 17.5* 15.6*  HCT 49.5* 45.6  MCV 91.2 92.3  PLT 288 245   Basic Metabolic Panel  Recent Labs  01/11/13 1325 01/11/13 1516 01/11/13 1900 01/12/13 0718  NA 134*  --   --  139  K 7.3* 4.8  --  4.1  CL 100  --   --  103  CO2 19  --   --  20  GLUCOSE 157*  --   --  148*  BUN 14  --   --  12  CREATININE 0.74  --   --  0.80  CALCIUM 10.0  --   --  9.3  MG  --   --  1.8  --    Liver Function Tests  Recent Labs  01/11/13 1900  AST 16  ALT 24  ALKPHOS 78  BILITOT 0.4  PROT 6.6  ALBUMIN 3.7    Recent Labs  01/11/13 1900  LIPASE 41   Cardiac Enzymes  Recent Labs  01/11/13 1900 01/12/13 0025 01/12/13 0718  TROPONINI <0.30 <0.30 <0.30   Disposition  Pt is being discharged home today in good condition.  Follow-up Plans & Appointments      Follow-up Information   Follow up with EDMISTEN, BROOKE, PA-C On 01/28/2013. (11:00)    Specialty:  Cardiology   Contact information:   [address] [place] [address] [phone]       Follow up with Sherryl Manges, MD On 04/08/2013. (9:15 AM)    Specialty:  Cardiology   Contact information:   [address]   [place]  [address] [phone]      Discharge Medications    Medication List    STOP taking these medications       diltiazem 180 MG 24 hr capsule  Commonly known as:  CARDIZEM CD     metoprolol succinate 50 MG 24 hr tablet  Commonly known as:  TOPROL-XL      TAKE these medications       apixaban 5 MG Tabs tablet  Commonly known as:  ELIQUIS  Take 1 tablet (5 mg total) by mouth 2 (two) times daily.     famotidine 20 MG tablet  Commonly known as:  PEPCID  Take 20 mg by mouth as needed for indigestion.     losartan 100 MG tablet  Commonly known as:  COZAAR  Take 100 mg by mouth daily.     metFORMIN 500 MG 24 hr tablet  Commonly known as:  GLUCOPHAGE XR  Take 1 tablet (500 mg total) by mouth daily with breakfast.     pantoprazole 40 MG tablet  Commonly known as:  PROTONIX  Take 1 tablet (40 mg total) by mouth daily.     pravastatin 80 MG tablet  Commonly known as:  PRAVACHOL  Take 1 tablet (80 mg total) by mouth daily.     sotalol 160 MG tablet  Commonly known as:  BETAPACE  Take 1 tablet (160 mg total) by mouth every 12 (twelve) hours.       Outstanding Labs/Studies  None  Duration of Discharge Encounter   Greater than 30 minutes including physician time.  Signed, Nicolasa Ducking NP 01/14/2013, 1:14 PM  Attending Note:   The patient was seen and examined.  Agree with assessment and plan as noted above.  Changes made to the above note as needed.  See my note from today.  Pt is stable for DC   Alvia Grove., MD, [facility] 01/14/2013, 6:11 PM

## 2013-01-14 NOTE — Progress Notes (Signed)
Reviewed discharge instructions with patient and family, they stated their understanding.  Patient ok to drive per Ward Givens NP, updated patient.  Discharged home with family.  Nina Carpenter

## 2013-01-21 ENCOUNTER — Telehealth: Payer: Self-pay | Admitting: Internal Medicine

## 2013-01-21 ENCOUNTER — Telehealth: Payer: Self-pay | Admitting: *Deleted

## 2013-01-21 NOTE — Telephone Encounter (Signed)
Patient "not feeling well", HR 130-140s, husband not sure how long HR has been elevated. Recent AFib with hospital visit, cardizem drip, and d/c on sotalol BID.  Reviewed with DOD, Dr. Delton See - advised pt to go by [facility] office for EKG, scheduled for tomorrow. Advised if HR increases > 140s, becomes more symptomatic overnight to go to the ED.  Pt's husband verbalized understanding and agreeable to plan.

## 2013-01-21 NOTE — Telephone Encounter (Signed)
[name] with [facility] called to inform us that Nina Carpenter was a no show for her stress test.

## 2013-01-21 NOTE — Telephone Encounter (Signed)
Called patient to inform her that she missed her scheduled stress test today. Patient stated that Dr. Ladona Ridgel cancelled her stress test after her hospital admission 12/20. However, it was never cancelled through [facility] or [facility].

## 2013-01-21 NOTE — Telephone Encounter (Signed)
New message     Released from hosp on 01-14-13.  Today, pt is back in afib.  Pt also does not feel good.   Will pt need to adjust her sotalol?

## 2013-01-21 NOTE — Telephone Encounter (Signed)
Scheduled EKG at [facility] office per [place] in [place]

## 2013-01-21 NOTE — Telephone Encounter (Signed)
FYI

## 2013-01-22 ENCOUNTER — Ambulatory Visit (INDEPENDENT_AMBULATORY_CARE_PROVIDER_SITE_OTHER): Payer: Medicare Other | Admitting: Internal Medicine

## 2013-01-22 ENCOUNTER — Encounter (HOSPITAL_COMMUNITY): Payer: Self-pay | Admitting: Emergency Medicine

## 2013-01-22 ENCOUNTER — Encounter: Payer: Medicare Other | Admitting: Internal Medicine

## 2013-01-22 ENCOUNTER — Emergency Department (HOSPITAL_COMMUNITY)
Admission: EM | Admit: 2013-01-22 | Discharge: 2013-01-22 | Disposition: A | Discharge status: Home / Self Care | Payer: Medicare Other | Attending: Emergency Medicine | Admitting: Emergency Medicine

## 2013-01-22 VITALS — BP 140/100 | HR 123 | Ht 63.0 in | Wt 183.5 lb

## 2013-01-22 DIAGNOSIS — E785 Hyperlipidemia, unspecified: Secondary | ICD-10-CM | POA: Diagnosis not present

## 2013-01-22 DIAGNOSIS — E119 Type 2 diabetes mellitus without complications: Secondary | ICD-10-CM | POA: Diagnosis not present

## 2013-01-22 DIAGNOSIS — Z87442 Personal history of urinary calculi: Secondary | ICD-10-CM | POA: Diagnosis not present

## 2013-01-22 DIAGNOSIS — Z8673 Personal history of transient ischemic attack (TIA), and cerebral infarction without residual deficits: Secondary | ICD-10-CM | POA: Diagnosis not present

## 2013-01-22 DIAGNOSIS — I5030 Unspecified diastolic (congestive) heart failure: Secondary | ICD-10-CM | POA: Insufficient documentation

## 2013-01-22 DIAGNOSIS — J45909 Unspecified asthma, uncomplicated: Secondary | ICD-10-CM | POA: Diagnosis not present

## 2013-01-22 DIAGNOSIS — Z87891 Personal history of nicotine dependence: Secondary | ICD-10-CM | POA: Diagnosis not present

## 2013-01-22 DIAGNOSIS — K219 Gastro-esophageal reflux disease without esophagitis: Secondary | ICD-10-CM | POA: Diagnosis not present

## 2013-01-22 DIAGNOSIS — I4891 Unspecified atrial fibrillation: Secondary | ICD-10-CM

## 2013-01-22 DIAGNOSIS — I10 Essential (primary) hypertension: Secondary | ICD-10-CM | POA: Diagnosis not present

## 2013-01-22 DIAGNOSIS — I480 Paroxysmal atrial fibrillation: Secondary | ICD-10-CM

## 2013-01-22 DIAGNOSIS — Z79899 Other long term (current) drug therapy: Secondary | ICD-10-CM | POA: Insufficient documentation

## 2013-01-22 DIAGNOSIS — E669 Obesity, unspecified: Secondary | ICD-10-CM | POA: Diagnosis not present

## 2013-01-22 DIAGNOSIS — Z880 Allergy status to penicillin: Secondary | ICD-10-CM | POA: Diagnosis not present

## 2013-01-22 DIAGNOSIS — Z8619 Personal history of other infectious and parasitic diseases: Secondary | ICD-10-CM | POA: Diagnosis not present

## 2013-01-22 DIAGNOSIS — Z950 Presence of cardiac pacemaker: Secondary | ICD-10-CM | POA: Diagnosis not present

## 2013-01-22 LAB — BASIC METABOLIC PANEL
Calcium: 9.7 mg/dL (ref 8.4–10.5)
Chloride: 103 mEq/L (ref 96–112)
Creatinine, Ser: 0.87 mg/dL (ref 0.50–1.10)
GFR calc Af Amer: 78 mL/min — ABNORMAL LOW (ref >90)
GFR calc non Af Amer: 67 mL/min — ABNORMAL LOW (ref >90)
Sodium: 142 mEq/L (ref 137–147)

## 2013-01-22 LAB — CBC WITH DIFFERENTIAL/PLATELET
Basophils Absolute: 0.1 10*3/uL (ref 0.0–0.1)
Basophils Relative: 1 % (ref 0–1)
Eosinophils Absolute: 0.1 10*3/uL (ref 0.0–0.7)
Eosinophils Relative: 1 % (ref 0–5)
HCT: 46 % (ref 36.0–46.0)
MCH: 31.7 pg (ref 26.0–34.0)
MCHC: 34.6 g/dL (ref 30.0–36.0)
Monocytes Absolute: 0.5 10*3/uL (ref 0.1–1.0)
Neutro Abs: 6 10*3/uL (ref 1.7–7.7)
Platelets: 275 10*3/uL (ref 150–400)
RDW: 12.7 % (ref 11.5–15.5)

## 2013-01-22 LAB — TROPONIN I: Troponin I: <0.3 ng/mL (ref <0.30)

## 2013-01-22 MED ORDER — AMIODARONE HCL 200 MG PO TABS: ORAL_TABLET | ORAL | Status: DC

## 2013-01-22 NOTE — ED Provider Notes (Addendum)
CSN: [id number]     Arrival date & time 01/22/13  1016 History   First MD Initiated Contact with Patient 01/22/13 1100     Chief Complaint  Patient presents with  . Atrial Flutter   (Consider location/radiation/quality/duration/timing/severity/associated sxs/prior Treatment) HPI Comments: Patient is a 67 year old female sent to the emergency department for evaluation of palpitations and irregular heartbeat. She has a history of atrial fibrillation and her medications were recently changed. She denies chest pain, shortness of breath, nausea, fever or other symptoms. She called the cardiology office and was told to come here to see Dr. [name].  Patient is a 67 y.o. female presenting with palpitations. The history is provided by the patient.  Palpitations Palpitations quality:  Irregular Onset quality:  Sudden Duration:  24 hours Timing:  Constant Progression:  Worsening Chronicity:  New Relieved by:  Nothing Worsened by:  Nothing tried Ineffective treatments:  None tried Associated symptoms: no diaphoresis and no nausea     Past Medical History  Diagnosis Date  . Allergy   . Asthma     a. Remotely.  . Type 2 diabetes mellitus   . GERD (gastroesophageal reflux disease)   . Hyperlipidemia   . Hypertension   . Kidney stones     a. Remotely.  Marland Kitchen Hx of rheumatic fever     a. As a child.  Marland Kitchen PAF (paroxysmal atrial fibrillation)     a. Dx 09/2012, chronicity unclear at that time. Initially refused Xarelto due to risk of bleeding. b. Sustained CVA 12/2012 - agreeable to taking Eliquis. Tachybrady syndrome during that admission s/p Medtronic PPM implantation;  c. 12/2012 sotalol initiated.  . Obesity   . CVA (cerebral vascular accident)     a. Thromboemoblic L MCA CVA 12/2012 with no residual deficit except mild occasional word finding. b. 1-39% RICA/LICA by dopplers 12/29/12.  . Tachy-brady syndrome     a. AF RVR with pauses >5sec requiring Medtronic PPM 12/2012.  . H/O medication  noncompliance     a. Per PCP note 01/06/13: Previously refused anything greater than aspirin, refused treatment for DM, and often stopped hypertensive meds and lipid meds.  . Diastolic CHF     a. Felt related to AF. EF 45-50% by echo 12/2012.  . LV dysfunction     a. EF 45-50% by echo 12/29/12.  . Acid reflux disease    Past Surgical History  Procedure Laterality Date  . Cholecystectomy    . Tubal ligation      ectopic preganacy   . Destruction trigeminal nerve via neurolytic agent  NOv @013     Gamma Knife   . Pacemaker insertion  12-30-12    MDT dual chamber pacemaker implanted by Dr [name] for tachy-brady syndrome   Family History  Problem Relation Age of Onset  . Stroke Mother   . Diabetes Mother    History  Substance Use Topics  . Smoking status: Former Smoker -- 2.00 packs/day    Types: Cigarettes    Quit date: 01/24/1968  . Smokeless tobacco: Never Used  . Alcohol Use: 0.6 oz/week    1 Glasses of wine per week     Comment: rare use   OB History   Grav Para Term Preterm Abortions TAB SAB Ect Mult Living                 Review of Systems  Constitutional: Negative for diaphoresis.  Cardiovascular: Positive for palpitations.  Gastrointestinal: Negative for nausea.  All other systems reviewed and  are negative.    Allergies  Crestor; Fish allergy; Lipitor; Penicillins; and Ace inhibitors  Home Medications   Current Outpatient Rx  Name  Route  Sig  Dispense  Refill  . apixaban (ELIQUIS) 5 MG TABS tablet   Oral   Take 1 tablet (5 mg total) by mouth 2 (two) times daily.   60 tablet   0   . famotidine (PEPCID) 20 MG tablet   Oral   Take 20 mg by mouth as needed for indigestion.         Marland Kitchen losartan (COZAAR) 100 MG tablet   Oral   Take 100 mg by mouth daily.         . metFORMIN (GLUCOPHAGE XR) 500 MG 24 hr tablet   Oral   Take 1 tablet (500 mg total) by mouth daily with breakfast.   30 tablet   0   . pantoprazole (PROTONIX) 40 MG tablet   Oral    Take 1 tablet (40 mg total) by mouth daily.   30 tablet   4   . pravastatin (PRAVACHOL) 80 MG tablet   Oral   Take 1 tablet (80 mg total) by mouth daily.   30 tablet   0   . sotalol (BETAPACE) 160 MG tablet   Oral   Take 1 tablet (160 mg total) by mouth every 12 (twelve) hours.   60 tablet   6    BP 130/74  Pulse 65  Temp(Src) 98 F (36.7 C) (Oral)  Resp 18  SpO2 97% Physical Exam  Nursing note and vitals reviewed. Constitutional: She is oriented to person, place, and time. She appears well-developed and well-nourished. No distress.  HENT:  Head: Normocephalic and atraumatic.  Neck: Normal range of motion. Neck supple.  Cardiovascular: Exam reveals no gallop and no friction rub.   No murmur heard. Heart was initially irregularly irregular and converted to a regular rhythm shortly after arrival.  Pulmonary/Chest: Effort normal and breath sounds normal. No respiratory distress. She has no wheezes.  Abdominal: Soft. Bowel sounds are normal. She exhibits no distension. There is no tenderness.  Musculoskeletal: Normal range of motion.  Neurological: She is alert and oriented to person, place, and time.  Skin: Skin is warm and dry. She is not diaphoretic.    ED Course  Procedures (including critical care time) Labs Review Labs Reviewed  CBC WITH DIFFERENTIAL  BASIC METABOLIC PANEL  TROPONIN I   Imaging Review No results found.  EKG Interpretation    Date/Time:  Wednesday January 22 2013 10:21:40 EST Ventricular Rate:  80 PR Interval:  188 QRS Duration: 82 QT Interval:  428 QTC Calculation: 493 R Axis:   86 Text Interpretation:  AV dual-paced rhythm Abnormal ECG Confirmed by DELOS  MD, Callaway Hailes ([id number]) on 01/22/2013 11:29:31 AM            MDM  No diagnosis found. Patient is a 67 year old female with history of paroxysmal A. fib. She has an indwelling pacemaker as well for bradycardia in the past. She was sent here from cardiology for evaluation of  recurrent A. fib. This converted to a sinus rhythm shortly after arriving here and she now feels better. Dr. [name] evaluated the patient and will recommend changing her sotalol to amiodarone and plans to see her in the office in the next few weeks. Laboratory studies here are essentially unremarkable with the exception of an elevated sugar 179.    Geoffery Lyons, MD 01/22/13 1511  Geoffery Lyons, MD 02/04/13  2209 

## 2013-01-22 NOTE — Consult Note (Signed)
ELECTROPHYSIOLOGY CONSULT NOTE    Patient ID: Nina Carpenter MRN: [id number], DOB/AGE: 1945/05/16 67 y.o.  Admit date: 01/22/2013 Date of Consult: 01-22-2013  Primary Physician: [name], MD Primary Cardiologist: Dossie Arbour, MD Electrophysiologist: Sherryl Manges, MD  Reason for Consultation: recurrent atrial fibrillation  HPI:  Nina Carpenter is a 67 year old female with a past medical history of tachy-brady syndrome status post pacemaker implant, type 2 diabetes, GERD, hyperlipidemia, and hypertension. She has had recurrent atrial fibrillation on Flecainide and was changed to Sotalol earlier this month.  Her atrial fibrillation is associated with shortness of breath, palpitations, and fatigue.    She had another episode earlier today and went to the [place] office for an EKG which demonstrated afib with a ventricular rate of 123.  She was referred to the ER for consideration of cardioversion.  On arrival to the ER, she was found to have reverted to an atrially paced rhythm.    She is anticoagulated with Eliquis and states she has been compliant.  Last echo 12-29-2012 demonstrated EF 45-50%, no RWMA, LA 37. Lab work this admission is unremarkable.   EP has been asked to consult.   Past Medical History  Diagnosis Date  . Allergy   . Asthma     a. Remotely.  . Type 2 diabetes mellitus   . GERD (gastroesophageal reflux disease)   . Hyperlipidemia   . Hypertension   . Kidney stones     a. Remotely.  Marland Kitchen Hx of rheumatic fever     a. As a child.  Marland Kitchen PAF (paroxysmal atrial fibrillation)     a. Dx 09/2012, chronicity unclear at that time. Initially refused Xarelto due to risk of bleeding. b. Sustained CVA 12/2012 - agreeable to taking Eliquis. Tachybrady syndrome during that admission s/p Medtronic PPM implantation;  c. 12/2012 sotalol initiated.  . Obesity   . CVA (cerebral vascular accident)     a. Thromboemoblic L MCA CVA 12/2012 with no residual deficit except mild occasional  word finding. b. 1-39% RICA/LICA by dopplers 12/29/12.  . Tachy-brady syndrome     a. AF RVR with pauses >5sec requiring Medtronic PPM 12/2012.  . H/O medication noncompliance     a. Per PCP note 01/06/13: Previously refused anything greater than aspirin, refused treatment for DM, and often stopped hypertensive meds and lipid meds.  . Diastolic CHF     a. Felt related to AF. EF 45-50% by echo 12/2012.  . LV dysfunction     a. EF 45-50% by echo 12/29/12.  . Acid reflux disease      Surgical History:  Past Surgical History  Procedure Laterality Date  . Cholecystectomy    . Tubal ligation      ectopic preganacy   . Destruction trigeminal nerve via neurolytic agent  NOv @013     Gamma Knife   . Pacemaker insertion  12-30-12    MDT dual chamber pacemaker implanted by Dr [name] for tachy-brady syndrome      Current outpatient prescriptions: apixaban (ELIQUIS) 5 MG TABS tablet, Take 1 tablet (5 mg total) by mouth 2 (two) times daily., Disp: 60 tablet, Rfl: 0;   famotidine (PEPCID) 20 MG tablet, Take 20 mg by mouth as needed for indigestion., Disp: , Rfl: ;   losartan (COZAAR) 100 MG tablet, Take 100 mg by mouth daily., Disp: , Rfl:  metFORMIN (GLUCOPHAGE XR) 500 MG 24 hr tablet, Take 1 tablet (500 mg total) by mouth daily with breakfast., Disp: 30 tablet, Rfl:  0;   pantoprazole (PROTONIX) 40 MG tablet, Take 1 tablet (40 mg total) by mouth daily., Disp: 30 tablet, Rfl: 4;   pravastatin (PRAVACHOL) 80 MG tablet, Take 1 tablet (80 mg total) by mouth daily., Disp: 30 tablet, Rfl: 0 sotalol (BETAPACE) 160 MG tablet, Take 1 tablet (160 mg total) by mouth every 12 (twelve) hours., Disp: 60 tablet, Rfl: 6   Allergies:  Allergies  Allergen Reactions  . Crestor [Rosuvastatin]     myalgia  . Fish Allergy Swelling    Shell fish  . Lipitor [Atorvastatin]     myalgias  . Penicillins Hives and Itching  . Ace Inhibitors Cough    History   Social History  . Marital Status: Married    Spouse  Name: N/A    Number of Children: 2  . Years of Education: N/A   Occupational History  . cosmetologist    Social History Main Topics  . Smoking status: Former Smoker -- 2.00 packs/day    Types: Cigarettes    Quit date: 01/24/1968  . Smokeless tobacco: Never Used  . Alcohol Use: 0.6 oz/week    1 Glasses of wine per week     Comment: rare use  . Drug Use: No  . Sexual Activity: Not on file   Other Topics Concern  . Not on file   Social History Narrative  . No narrative on file     Family History  Problem Relation Age of Onset  . Stroke Mother   . Diabetes Mother     PE BP 118/90  Pulse 68  Temp(Src) 98 F (36.7 C) (Oral)  Resp 18  SpO2 96% Well developed and nourished in no acute distress HENT normal Neck supple with JVP-flat Carotids brisk and full   Clear Device pocket well healed; without hematoma or erythema.  There is no tethering  Regular rate and rhythm, no murmurs or gallops Abd-soft with active BS without hepatomegaly No Clubbing cyanosis edema Skin-warm and dry A & Oriented  Grossly normal sensory and motor function   Labs:   Lab Results  Component Value Date   WBC 8.6 01/22/2013   HGB 15.9* 01/22/2013   HCT 46.0 01/22/2013   MCV 91.8 01/22/2013   PLT 275 01/22/2013    Recent Labs Lab 01/22/13 1147  NA 142  K 4.8  CL 103  CO2 25  BUN 16  CREATININE 0.87  CALCIUM 9.7  GLUCOSE 179*   Lab Results  Component Value Date   TROPONINI <0.30 01/22/2013     Radiology/Studies: Dg Chest 2 View 12/31/2012   CLINICAL DATA:  Pacemaker implantation.  EXAM: CHEST  2 VIEW  COMPARISON:  12/30/2012.  FINDINGS: Cardiopericardial silhouette is upper limits of normal for projection. Small left pleural effusion is present. Two lead left subclavian cardiac pacemaker. Mediastinal contours are within normal limits. Aortic arch atherosclerosis. No airspace disease or pulmonary edema. No pneumothorax status post pacemaker placement.  IMPRESSION: Uncomplicated  left subclavian cardiac pacemaker. Improved lung volumes compared to yesterday. Small left pleural effusion and borderline heart size.   Electronically Signed   By: Andreas Newport M.D.   On: 12/31/2012 09:18   [id number] pacing with intrinsic ventricular conduction, rate 82, normal intervals  TELEMETRY: atrial pacing, no further afib  DEVICE HISTORY: MDT dual chamber pacemaker implanted 11-2012 for tachybrady with post termination pauses  Assessment and Plan:  Atrial fibrillation  Recurrent paroxysms with symptoms has broken through now two drugs, flecainide and sotalol.  Will refer for catheter  ablation altough timing will need to be deferred for a few months 2/2 newly implanted PM Will use amio as this will be relatviely short duration of therapy amio 400 bidx 2 weeks 400 daily X 2 weeks Then 200 daily Will arrange followup with Dr Fawn Kirk for consideration of catheter ablation  And device and rhtyhm followup with [name]  [facility] for discharge And stop sotalol

## 2013-01-22 NOTE — ED Notes (Addendum)
Pt went to pcp office today and sent here due to having irregular HR. Pt reports noticing it was irregular yesterday, has hx of same and takes meds. Need to page dr [name] on pts arrival. No acute distress noted at triage, airway intact, ekg done.

## 2013-01-22 NOTE — ED Notes (Signed)
Dr. Delo at the bedside 

## 2013-01-22 NOTE — ED Notes (Signed)
Informed patient that we are waiting on Dr. [name] to come and see her for evaluation. Denies any pain at this time. Reports that she is hungry and thirsty.

## 2013-01-22 NOTE — Progress Notes (Signed)
Patient stated " I feel like I am back in a bad rhythm". The said she felt tired and sickly. Denies chest pain and shortness of breath. EKG showed atrial flutter. Blood pressure is 140/100. Results called to Dr. [name] in [place] and communicated by Almira Coaster. EKG faxed to Dr. [name] for review. Per Dr. [name] send patient to [facility] ED.    Nurse to nurse report called to [facility] ED.

## 2013-01-22 NOTE — Progress Notes (Deleted)
lmtcb

## 2013-01-22 NOTE — Patient Instructions (Signed)
Per Dr. [name] please go straight to [facility] ED.

## 2013-01-23 ENCOUNTER — Other Ambulatory Visit: Payer: Self-pay | Admitting: Nurse Practitioner

## 2013-01-23 MED ORDER — PROPAFENONE HCL 300 MG PO TABS: 300.0000 mg | ORAL_TABLET | Freq: Two times a day (BID) | ORAL | Status: DC

## 2013-01-23 NOTE — Telephone Encounter (Signed)
Pts husband called this AM to report that since taking her first dose of Amiodarone last night, Nina Carpenter has been experiencing itching w/o rash.  She notes that she has had similar rxns in the past with certain shell fish.  I spoke with Dr. Caryl Comes and collectively we agreed that we will need to discontinue amiodarone.  The only other outpatient antiarrhythmic option will be propafenone, which Dr. Caryl Comes recommended initiating @ 300mg  BID.  I called Nina Carpenter and her husband back and went over our options.  We discussed why going back on sotalol was likely of little utility given that she already had recurrent PAF leading to an ER visit yesterday.  They would like to proceed with propafenone initiation.  I sent in a Rx for 300mg  bid, # 60, 3 refills to their local pharmacy in [place].  I recommended that she use oral prn benadryl for itching but not to drive after using.  Nina Carpenter verbalized understanding of directions and was grateful for the call back.

## 2013-01-25 ENCOUNTER — Telehealth: Payer: Self-pay | Admitting: Physician Assistant

## 2013-01-25 NOTE — Telephone Encounter (Addendum)
The patient's husband called the answering service because the patient is back in AF rates 120 and up to 150s at times. Recently dc from hospital on sotalol. Unfortunately did not hold sinus. Was seen in ER and prescribed amiodarone but developed itching with this. She was prescribed propafenone but they were unable to locate this at any of their local pharmacies - it has been ordered and they will be picking it up on Monday. He says they were told to go back to sotalol until they can get the propafenone since it did convey some antiarrhythmic effect. She has been taking the sotalol, but HR still high this AM. BP 140/95. She is bothered by palpitations and "draggy" but otherwise feels OK. No reported CP, syncope, dizziness. Husband asked if they could perhaps take the diltiazem they still have on hand - they have 120mg  tabs and 180mg  tabs. (She was changed from oral to IV dilt last admission but this was later discontinued when she converted to NSR). I advised that she may take one 120mg  tablet today to see if it helps. Can repeat tomorrow AM in 24 hours if PAF recurs, and keep eye on BP. Otherwise would continue plan to change to propafenone as previously instructed when they obtain it on Monday. They also have followup with Jerene Pitch early next week. I advised to return to ER if becomes increasingly symptomatic, does not feel well, or becomes concerned. He verbalized understanding of plan.  Dayna Dunn PA-C

## 2013-01-25 NOTE — Telephone Encounter (Signed)
See other phone note from earlier today. Patient's husband called back. Turns out the pharmacy now has propafenone in stock and he has it in hand. He was wondering about timing of initiation. She took the diltiazem dose that I instructed earlier and is comfortably back in NSR for now. I d/w Dr. Domenic Polite. We agreed safest thing to do is to initiate propafenone tomorrow given that she took a dose of sotalol this morning. I relayed these recommendations to patient's husband (no more sotalol/diltiazem; start propafenone tomorrow) who verbalized understanding and gratitude. Jaydah Stahle PA-C

## 2013-01-27 ENCOUNTER — Telehealth: Payer: Self-pay | Admitting: Internal Medicine

## 2013-01-27 ENCOUNTER — Encounter (HOSPITAL_COMMUNITY): Payer: Self-pay | Admitting: Emergency Medicine

## 2013-01-27 ENCOUNTER — Emergency Department (HOSPITAL_COMMUNITY): Payer: Medicare Other

## 2013-01-27 ENCOUNTER — Inpatient Hospital Stay (HOSPITAL_COMMUNITY)
Admission: EM | Admit: 2013-01-27 | Discharge: 2013-01-30 | DRG: 309 | Disposition: A | Discharge status: Home / Self Care | Payer: Medicare Other | Attending: Cardiology | Admitting: Cardiology

## 2013-01-27 DIAGNOSIS — Z888 Allergy status to other drugs, medicaments and biological substances status: Secondary | ICD-10-CM | POA: Diagnosis not present

## 2013-01-27 DIAGNOSIS — Z87891 Personal history of nicotine dependence: Secondary | ICD-10-CM

## 2013-01-27 DIAGNOSIS — K219 Gastro-esophageal reflux disease without esophagitis: Secondary | ICD-10-CM | POA: Diagnosis present

## 2013-01-27 DIAGNOSIS — J40 Bronchitis, not specified as acute or chronic: Secondary | ICD-10-CM | POA: Diagnosis not present

## 2013-01-27 DIAGNOSIS — Z833 Family history of diabetes mellitus: Secondary | ICD-10-CM | POA: Diagnosis not present

## 2013-01-27 DIAGNOSIS — E119 Type 2 diabetes mellitus without complications: Secondary | ICD-10-CM | POA: Diagnosis present

## 2013-01-27 DIAGNOSIS — Z880 Allergy status to penicillin: Secondary | ICD-10-CM | POA: Diagnosis not present

## 2013-01-27 DIAGNOSIS — I509 Heart failure, unspecified: Secondary | ICD-10-CM | POA: Diagnosis not present

## 2013-01-27 DIAGNOSIS — Z91013 Allergy to seafood: Secondary | ICD-10-CM

## 2013-01-27 DIAGNOSIS — Z91199 Patient's noncompliance with other medical treatment and regimen due to unspecified reason: Secondary | ICD-10-CM | POA: Diagnosis not present

## 2013-01-27 DIAGNOSIS — I495 Sick sinus syndrome: Secondary | ICD-10-CM | POA: Diagnosis present

## 2013-01-27 DIAGNOSIS — Z9119 Patient's noncompliance with other medical treatment and regimen: Secondary | ICD-10-CM | POA: Diagnosis not present

## 2013-01-27 DIAGNOSIS — E669 Obesity, unspecified: Secondary | ICD-10-CM | POA: Diagnosis present

## 2013-01-27 DIAGNOSIS — Z6832 Body mass index (BMI) 32.0-32.9, adult: Secondary | ICD-10-CM

## 2013-01-27 DIAGNOSIS — Z8673 Personal history of transient ischemic attack (TIA), and cerebral infarction without residual deficits: Secondary | ICD-10-CM

## 2013-01-27 DIAGNOSIS — Z7901 Long term (current) use of anticoagulants: Secondary | ICD-10-CM

## 2013-01-27 DIAGNOSIS — I10 Essential (primary) hypertension: Secondary | ICD-10-CM | POA: Diagnosis present

## 2013-01-27 DIAGNOSIS — I5030 Unspecified diastolic (congestive) heart failure: Secondary | ICD-10-CM | POA: Diagnosis not present

## 2013-01-27 DIAGNOSIS — I639 Cerebral infarction, unspecified: Secondary | ICD-10-CM

## 2013-01-27 DIAGNOSIS — I4891 Unspecified atrial fibrillation: Secondary | ICD-10-CM | POA: Diagnosis not present

## 2013-01-27 DIAGNOSIS — J45909 Unspecified asthma, uncomplicated: Secondary | ICD-10-CM | POA: Diagnosis present

## 2013-01-27 DIAGNOSIS — Z823 Family history of stroke: Secondary | ICD-10-CM | POA: Diagnosis not present

## 2013-01-27 DIAGNOSIS — E785 Hyperlipidemia, unspecified: Secondary | ICD-10-CM | POA: Diagnosis present

## 2013-01-27 DIAGNOSIS — Z79899 Other long term (current) drug therapy: Secondary | ICD-10-CM

## 2013-01-27 DIAGNOSIS — Z950 Presence of cardiac pacemaker: Secondary | ICD-10-CM

## 2013-01-27 DIAGNOSIS — I6350 Cerebral infarction due to unspecified occlusion or stenosis of unspecified cerebral artery: Secondary | ICD-10-CM | POA: Diagnosis not present

## 2013-01-27 LAB — BASIC METABOLIC PANEL
BUN: 13 mg/dL (ref 6–23)
CHLORIDE: 100 meq/L (ref 96–112)
CO2: 17 mEq/L — ABNORMAL LOW (ref 19–32)
Calcium: 9.2 mg/dL (ref 8.4–10.5)
Creatinine, Ser: 0.94 mg/dL (ref 0.50–1.10)
GFR calc non Af Amer: 61 mL/min — ABNORMAL LOW (ref >90)
GFR, EST AFRICAN AMERICAN: 71 mL/min — AB (ref >90)
GLUCOSE: 202 mg/dL — AB (ref 70–99)
POTASSIUM: 4.5 meq/L (ref 3.7–5.3)
Sodium: 138 mEq/L (ref 137–147)

## 2013-01-27 LAB — POCT I-STAT TROPONIN I: Troponin i, poc: 0 ng/mL (ref 0.00–0.08)

## 2013-01-27 LAB — CBC
HCT: 44.3 % (ref 36.0–46.0)
HEMOGLOBIN: 15.6 g/dL — AB (ref 12.0–15.0)
MCH: 32.3 pg (ref 26.0–34.0)
MCHC: 35.2 g/dL (ref 30.0–36.0)
MCV: 91.7 fL (ref 78.0–100.0)
Platelets: 194 10*3/uL (ref 150–400)
RBC: 4.83 MIL/uL (ref 3.87–5.11)
RDW: 12.6 % (ref 11.5–15.5)
WBC: 7.3 10*3/uL (ref 4.0–10.5)

## 2013-01-27 MED ORDER — DILTIAZEM HCL 25 MG/5ML IV SOLN
20.0000 mg | Freq: Once | INTRAVENOUS | Status: AC
  Administered 2013-01-27: 20 mg via INTRAVENOUS
  Filled 2013-01-27: qty 5

## 2013-01-27 MED ORDER — LOSARTAN POTASSIUM 50 MG PO TABS
100.0000 mg | ORAL_TABLET | Freq: Every evening | ORAL | Status: DC
  Administered 2013-01-28 – 2013-01-29 (×3): 100 mg via ORAL
  Filled 2013-01-27 (×4): qty 2

## 2013-01-27 MED ORDER — DILTIAZEM HCL 100 MG IV SOLR
5.0000 mg/h | INTRAVENOUS | Status: DC
  Administered 2013-01-27: 10 mg/h via INTRAVENOUS
  Administered 2013-01-28 – 2013-01-29 (×3): 15 mg/h via INTRAVENOUS
  Filled 2013-01-27 (×5): qty 100

## 2013-01-27 MED ORDER — APIXABAN 5 MG PO TABS
5.0000 mg | ORAL_TABLET | Freq: Two times a day (BID) | ORAL | Status: DC
  Administered 2013-01-28 – 2013-01-30 (×6): 5 mg via ORAL
  Filled 2013-01-27 (×7): qty 1

## 2013-01-27 NOTE — Telephone Encounter (Signed)
New Problem:  Pt's husband states his wife has been having a-fib over the weekend. Pt is requesting she be seen today. He is aware of her appt tomorrow. Pt started a new med protafenone... Husband would like a call back letting him know if his wife can be worked in today.

## 2013-01-27 NOTE — Telephone Encounter (Signed)
Spoke with patient's husband. See previous 2 phone notes of documentation over the weekend. Out of rhythm this AM with HR of 140 and BP 140/90. Has not had any Rythmol 300mg  since last night. Advised him to give the patient one dose now and if she is not better and heart rate does not slow down in 1 hour he will take her to the ER for treatment. Reviewed again per PA note from yesterday not to give her any more Sotolol or Cardizem.

## 2013-01-27 NOTE — ED Provider Notes (Signed)
CSN: [id number]     Arrival date & time 01/27/13  1524 History   First MD Initiated Contact with Patient 01/27/13 1540     Chief Complaint  Patient presents with  . Atrial Fibrillation   (Consider location/radiation/quality/duration/timing/severity/associated sxs/prior Treatment) The history is provided by the patient and the spouse.    Patient with hx afib with intermittent afib for the past month, and most recently for the past three several days.  She has been in contact with [facility] cardiology, who has tried several medications to control her rate.  She has been taking sotalol, last dose two days ago, switched to propafenone yesterday morning.  Has been given 120mg  amiodarone ER per cardiology last night at 11pm.  She continues with a rate between 150-170 today.  Pt states she feels nauseated and feels like she has butterflies in her abdomen.  Denies dizziness, lightheadedness, CP, SOB, leg swelling, focal neurologic deficits.  Pt s/p pacemaker insertion, hx CVA.    Past Medical History  Diagnosis Date  . Allergy   . Asthma     a. Remotely.  . Type 2 diabetes mellitus   . GERD (gastroesophageal reflux disease)   . Hyperlipidemia   . Hypertension   . Kidney stones     a. Remotely.  Marland Kitchen Hx of rheumatic fever     a. As a child.  Marland Kitchen PAF (paroxysmal atrial fibrillation)     a. Dx 09/2012, chronicity unclear at that time. Initially refused Xarelto due to risk of bleeding. b. Sustained CVA 12/2012 - agreeable to taking Eliquis. Tachybrady syndrome during that admission s/p Medtronic PPM implantation;  c. 12/2012 sotalol initiated.  . Obesity   . CVA (cerebral vascular accident)     a. Thromboemoblic L MCA CVA 78/2956 with no residual deficit except mild occasional word finding. b. 2-13% RICA/LICA by dopplers 08/29/55.  . Tachy-brady syndrome     a. AF RVR with pauses >5sec requiring Medtronic PPM 12/2012.  . H/O medication noncompliance     a. Per PCP note 01/06/13: Previously refused  anything greater than aspirin, refused treatment for DM, and often stopped hypertensive meds and lipid meds.  . Diastolic CHF     a. Felt related to AF. EF 45-50% by echo 12/2012.  . LV dysfunction     a. EF 45-50% by echo 12/29/12.  . Acid reflux disease    Past Surgical History  Procedure Laterality Date  . Cholecystectomy    . Tubal ligation      ectopic preganacy   . Destruction trigeminal nerve via neurolytic agent  NOv @013     Gamma Knife   . Pacemaker insertion  12-30-12    MDT dual chamber pacemaker implanted by Dr Caryl Comes for tachy-brady syndrome   Family History  Problem Relation Age of Onset  . Stroke Mother   . Diabetes Mother    History  Substance Use Topics  . Smoking status: Former Smoker -- 2.00 packs/day    Types: Cigarettes    Quit date: 01/24/1968  . Smokeless tobacco: Never Used  . Alcohol Use: 0.6 oz/week    1 Glasses of wine per week     Comment: rare use   OB History   Grav Para Term Preterm Abortions TAB SAB Ect Mult Living                 Review of Systems  Constitutional: Negative for fever and chills.  HENT: Positive for sinus pressure. Negative for congestion and sore throat.  Respiratory: Negative for cough and shortness of breath.   Cardiovascular: Negative for chest pain, palpitations and leg swelling.  Gastrointestinal: Positive for nausea. Negative for vomiting, abdominal pain and diarrhea.  Genitourinary: Negative for dysuria, urgency and frequency.  Neurological: Negative for dizziness, syncope, weakness, light-headedness and numbness.  Psychiatric/Behavioral: Negative for confusion.  All other systems reviewed and are negative.    Allergies  Crestor; Fish allergy; Lipitor; Penicillins; and Ace inhibitors  Home Medications   Current Outpatient Rx  Name  Route  Sig  Dispense  Refill  . apixaban (ELIQUIS) 5 MG TABS tablet   Oral   Take 1 tablet (5 mg total) by mouth 2 (two) times daily.   60 tablet   0   . famotidine  (PEPCID) 20 MG tablet   Oral   Take 20 mg by mouth as needed for indigestion.         Marland Kitchen losartan (COZAAR) 100 MG tablet   Oral   Take 100 mg by mouth daily.         . metFORMIN (GLUCOPHAGE XR) 500 MG 24 hr tablet   Oral   Take 1 tablet (500 mg total) by mouth daily with breakfast.   30 tablet   0   . pantoprazole (PROTONIX) 40 MG tablet   Oral   Take 1 tablet (40 mg total) by mouth daily.   30 tablet   4   . pravastatin (PRAVACHOL) 80 MG tablet   Oral   Take 1 tablet (80 mg total) by mouth daily.   30 tablet   0   . propafenone (RYTHMOL) 300 MG tablet   Oral   Take 1 tablet (300 mg total) by mouth 2 (two) times daily.   60 tablet   3   . sotalol (BETAPACE) 160 MG tablet   Oral   Take 1 tablet (160 mg total) by mouth every 12 (twelve) hours.   60 tablet   6    BP 166/104  Pulse 139  Temp(Src) 97.9 F (36.6 C) (Oral)  Resp 18  SpO2 99% Physical Exam  Nursing note and vitals reviewed. Constitutional: She appears well-developed and well-nourished. No distress.  HENT:  Head: Normocephalic and atraumatic.  Neck: Neck supple.  Cardiovascular: Intact distal pulses.  An irregular rhythm present. Tachycardia present.   Pulmonary/Chest: Effort normal and breath sounds normal. No respiratory distress. She has no wheezes. She has no rales.  Abdominal: Soft. She exhibits no distension. There is no tenderness. There is no rebound and no guarding.  Musculoskeletal: She exhibits no edema.  Neurological: She is alert.  Skin: She is not diaphoretic.    ED Course  Procedures (including critical care time) Labs Review Labs Reviewed  CBC - Abnormal; Notable for the following:    Hemoglobin 15.6 (*)    All other components within normal limits  BASIC METABOLIC PANEL - Abnormal; Notable for the following:    CO2 17 (*)    Glucose, Bld 202 (*)    GFR calc non Af Amer 61 (*)    GFR calc Af Amer 71 (*)    All other components within normal limits  POCT I-STAT TROPONIN  I   Imaging Review Dg Chest 2 View  01/27/2013   CLINICAL DATA:  Atrial fibrillation, hypertension, diabetes, pacemaker, asthma, history stroke, diastolic CHF  EXAM: CHEST  2 VIEW  COMPARISON:  01/11/2013  FINDINGS: Left subclavian transvenous pacemaker leads project at right atrium and right ventricle.  Enlargement of cardiac silhouette with  slight pulmonary vascular congestion.  Mediastinal contours normal.  Minimal bronchitic changes without infiltrate, pleural effusion or pneumothorax.  Scattered endplate spur formation thoracic spine.  IMPRESSION: Enlargement of cardiac silhouette with pulmonary vascular congestion.  Minimal bronchitic changes.  No acute abnormalities.   Electronically Signed   By: Lavonia Dana M.D.   On: 01/27/2013 17:39    EKG Interpretation   None       3:58 PM Discussed patient and plan with Dr Betsey Holiday.   4:24 PM Diltiazem bolus reduced rate to 90s-110.  Pressure [id number] systolic.   123456 PM Spoke with Dr Claiborne Billings, someone will see the pt in the ED.    Date: 01/27/2013  Rate: 139  Rhythm: atrial flutter   QRS Axis: normal  Intervals: normal  ST/T Wave abnormalities: indeterminate  Conduction Disutrbances:none  Narrative Interpretation:   Old EKG Reviewed: changes noted    MDM   1. Atrial fibrillation with RVR     Pt with hx afib, followed by [facility] cardiology, presents with afib with RVR.  Patient has been followed with cardiology and has had multiple medication changes of the past month. Patient was given bolus and drip of diltiazem with improvement with continued increased rate in the 90 to 110s range. Patient was seen and admitted by cardiology.     Clayton Bibles, PA-C 01/27/13 2010

## 2013-01-27 NOTE — ED Notes (Addendum)
Nina Carpenter (husband)  [phone]

## 2013-01-27 NOTE — Telephone Encounter (Signed)
New message     Wife is in afib---heart rate is 120 or higher.  She has an appt tomorrow---but the husband says he thought if her heart rate is greater than 120 go to the er.  Should she go to er?  She has no other symptoms/

## 2013-01-27 NOTE — ED Notes (Signed)
Pt here with afib; pt sts hx of same; pt sts palpitations x 3 days; pt HR 100-150 bpm; pt denies SOB or CP

## 2013-01-27 NOTE — ED Notes (Signed)
Unable to obtain CBC, LAB aware

## 2013-01-27 NOTE — Telephone Encounter (Signed)
Spoke with patient's husband. Heart rate still 120 to 140/atrial fib. Advised him to take her to the [facility] ER. Trish/[employer]master notified.

## 2013-01-27 NOTE — ED Provider Notes (Signed)
Medical screening examination/treatment/procedure(s) were conducted as a shared visit with non-physician practitioner(s) and myself.  I personally evaluated the patient during the encounter.   Patient presents with atrial fibrillation and rapid ventricular response. Patient's recent history reveals multiple medication changes in an attempt to prevent these paroxysms of atrial fibrillation. While she was in no distress. Heart rate was 140s to 150s. She did get partially controlled with IV Cardizem. Cardiology has been consulted, will admit the patient for further management.  Orpah Greek, MD 01/27/13 2044

## 2013-01-27 NOTE — ED Notes (Addendum)
Pt is concerned about missing her dose of anticoagulant. This RN spoke with cardiology, and found the order for Elequis.

## 2013-01-27 NOTE — Consult Note (Signed)
Reason for Consult: Atrial Fibrillation Referring Physician: [facility] ER MD   HPI: Nina Carpenter is a 68 year old female with a past medical history of tachy-brady syndrome, status post pacemaker implant, type 2 diabetes, GERD, hyperlipidemia, and hypertension. She has had recurrent atrial fibrillation on Flecainide and was changed to Sotalol in Dec 2014. Her atrial fibrillation is associated with shortness of breath, palpitations, and fatigue. She had another episode a rapid a-fib several days ago, while on Sotalol. DDCV was planned, however she spontaneously converted back into NSR. She was seen by EP on 01/22/13. She was evaluated at that time by Dr. Caryl Comes. Due to recurrent breakthrough AF, both with flecainide and with sotalol, it was decided that she should undergo catheter ablation. However, Dr. Caryl Comes felt that it was best to delay the procedure, due to her newly implanted PPM. In the interim, she was started on Amiodarone. However, this was recently discontinued after she developed an intolerance (itching). She was placed on Rythmol, which was started 2 days ago.  She returns today to the [facility] ER with yet again another episode of atrial fibrillation w/ RVR. Ventricular rate has been as high as the 140s. She feels a bit nauseated, but is otherwise asymptomatic. She denies SOB, chest pain, palpitations, dizziness, syncope/ near syncope. She has been started on IV Cardizem. HR is slightly improved, but still fluctuating up into the 120s. BP is stable.   She reports being compliant with all of her prescribed medications. She has not missed any scheduled doses of any her the antiarrthymics previously ordered. She is also compliant with Eliquis.     Past Medical History  Diagnosis Date  . Allergy   . Asthma     a. Remotely.  . Type 2 diabetes mellitus   . GERD (gastroesophageal reflux disease)   . Hyperlipidemia   . Hypertension   . Kidney stones     a. Remotely.  Marland Kitchen Hx of rheumatic fever     a. As a  child.  Marland Kitchen PAF (paroxysmal atrial fibrillation)     a. Dx 09/2012, chronicity unclear at that time. Initially refused Xarelto due to risk of bleeding. b. Sustained CVA 12/2012 - agreeable to taking Eliquis. Tachybrady syndrome during that admission s/p Medtronic PPM implantation;  c. 12/2012 sotalol initiated.  . Obesity   . CVA (cerebral vascular accident)     a. Thromboemoblic L MCA CVA 77/1165 with no residual deficit except mild occasional word finding. b. 7-90% RICA/LICA by dopplers 38/3/33.  . Tachy-brady syndrome     a. AF RVR with pauses >5sec requiring Medtronic PPM 12/2012.  . H/O medication noncompliance     a. Per PCP note 01/06/13: Previously refused anything greater than aspirin, refused treatment for DM, and often stopped hypertensive meds and lipid meds.  . Diastolic CHF     a. Felt related to AF. EF 45-50% by echo 12/2012.  . LV dysfunction     a. EF 45-50% by echo 12/29/12.  . Acid reflux disease     Past Surgical History  Procedure Laterality Date  . Cholecystectomy    . Tubal ligation      ectopic preganacy   . Destruction trigeminal nerve via neurolytic agent  NOv @013     Gamma Knife   . Pacemaker insertion  12-30-12    MDT dual chamber pacemaker implanted by Dr Caryl Comes for tachy-brady syndrome    Family History  Problem Relation Age of Onset  . Stroke Mother   . Diabetes Mother  Social History:  reports that she quit smoking about 45 years ago. Her smoking use included Cigarettes. She smoked 2.00 packs per day. She has never used smokeless tobacco. She reports that she drinks about 0.6 ounces of alcohol per week. She reports that she does not use illicit drugs.  Allergies:  Allergies  Allergen Reactions  . Amiodarone Itching  . Crestor [Rosuvastatin]     myalgia  . Fish Allergy Swelling    Shell fish  . Lipitor [Atorvastatin]     myalgias  . Penicillins Hives and Itching  . Ace Inhibitors Cough    Medications: Prior to Admission medications     Medication Sig Start Date End Date Taking? Authorizing Provider  apixaban (ELIQUIS) 5 MG TABS tablet Take 1 tablet (5 mg total) by mouth 2 (two) times daily. 01/03/13  Yes Cherene Altes, MD  diltiazem (DILACOR XR) 120 MG 24 hr capsule Take 120 mg by mouth daily as needed (high heart rate).   Yes Historical Provider, MD  famotidine (PEPCID) 20 MG tablet Take 20 mg by mouth as needed for indigestion.   Yes Historical Provider, MD  losartan (COZAAR) 100 MG tablet Take 100 mg by mouth every evening.    Yes Historical Provider, MD  metFORMIN (GLUCOPHAGE XR) 500 MG 24 hr tablet Take 1 tablet (500 mg total) by mouth daily with breakfast. 01/06/13  Yes Spencer [name], MD  pantoprazole (PROTONIX) 40 MG tablet Take 40 mg by mouth daily as needed (indigestion).   Yes Historical Provider, MD  pravastatin (PRAVACHOL) 80 MG tablet Take 1 tablet (80 mg total) by mouth daily. 01/01/13  Yes Cherene Altes, MD  propafenone (RYTHMOL) 300 MG tablet Take 1 tablet (300 mg total) by mouth 2 (two) times daily. 01/23/13  Yes Rogelia Mire, NP    Results for orders placed during the hospital encounter of 01/27/13 (from the past 48 hour(s))  CBC     Status: Abnormal   Collection Time    01/27/13  3:32 PM      Result Value Range   WBC 7.3  4.0 - 10.5 K/uL   RBC 4.83  3.87 - 5.11 MIL/uL   Hemoglobin 15.6 (*) 12.0 - 15.0 g/dL   HCT 44.3  36.0 - 46.0 %   MCV 91.7  78.0 - 100.0 fL   MCH 32.3  26.0 - 34.0 pg   MCHC 35.2  30.0 - 36.0 g/dL   RDW 12.6  11.5 - 15.5 %   Platelets 194  150 - 400 K/uL  BASIC METABOLIC PANEL     Status: Abnormal   Collection Time    01/27/13  3:32 PM      Result Value Range   Sodium 138  137 - 147 mEq/L   Potassium 4.5  3.7 - 5.3 mEq/L   Chloride 100  96 - 112 mEq/L   CO2 17 (*) 19 - 32 mEq/L   Glucose, Bld 202 (*) 70 - 99 mg/dL   BUN 13  6 - 23 mg/dL   Creatinine, Ser 0.94  0.50 - 1.10 mg/dL   Calcium 9.2  8.4 - 10.5 mg/dL   GFR calc non Af Amer 61 (*) >90 mL/min   GFR  calc Af Amer 71 (*) >90 mL/min   Comment: (NOTE)     The eGFR has been calculated using the CKD EPI equation.     This calculation has not been validated in all clinical situations.     eGFR's persistently <90 mL/min signify  possible Chronic Kidney     Disease.  POCT I-STAT TROPONIN I     Status: None   Collection Time    01/27/13  3:46 PM      Result Value Range   Troponin i, poc 0.00  0.00 - 0.08 ng/mL   Comment 3            Comment: Due to the release kinetics of cTnI,     a negative result within the first hours     of the onset of symptoms does not rule out     myocardial infarction with certainty.     If myocardial infarction is still suspected,     repeat the test at appropriate intervals.    Dg Chest 2 View  01/27/2013   CLINICAL DATA:  Atrial fibrillation, hypertension, diabetes, pacemaker, asthma, history stroke, diastolic CHF  EXAM: CHEST  2 VIEW  COMPARISON:  01/11/2013  FINDINGS: Left subclavian transvenous pacemaker leads project at right atrium and right ventricle.  Enlargement of cardiac silhouette with slight pulmonary vascular congestion.  Mediastinal contours normal.  Minimal bronchitic changes without infiltrate, pleural effusion or pneumothorax.  Scattered endplate spur formation thoracic spine.  IMPRESSION: Enlargement of cardiac silhouette with pulmonary vascular congestion.  Minimal bronchitic changes.  No acute abnormalities.   Electronically Signed   By: Lavonia Dana M.D.   On: 01/27/2013 17:39    Review of Systems  Respiratory: Negative for shortness of breath.   Cardiovascular: Negative for chest pain and palpitations.  Gastrointestinal: Positive for nausea. Negative for vomiting.  Neurological: Negative for dizziness and loss of consciousness.  All other systems reviewed and are negative.   Blood pressure 153/91, pulse 109, temperature 97.9 F (36.6 C), temperature source Oral, resp. rate 18, SpO2 98.00%. Physical Exam  Constitutional: She is oriented to  person, place, and time. She appears well-developed and well-nourished. No distress.  Neck: No JVD present. Carotid bruit is not present.  Cardiovascular: Intact distal pulses.  Exam reveals no gallop and no friction rub.   No murmur heard. Pulses:      Radial pulses are 2+ on the right side, and 2+ on the left side.       Dorsalis pedis pulses are 2+ on the right side, and 2+ on the left side.  Respiratory: Effort normal and breath sounds normal. No respiratory distress. She has no wheezes. She has no rales.  Musculoskeletal: She exhibits no edema.  Neurological: She is alert and oriented to person, place, and time.  Skin: Skin is warm and dry. She is not diaphoretic.  Psychiatric: She has a normal mood and affect. Her behavior is normal.    Assessment/Plan: Principal Problem:   Atrial fibrillation with rapid ventricular response Active Problems:   Chronic anticoagulation- Eliquis  Plan: 68 y/o female with persistent breakthrough atrial fibrillation, failing multiple antiarrhythmics including flecainide, sotalol, Amiodarone and now Rythmol. IV Cardizem started for A-fib w/ RVR. Ventricular rate slightly improved, but still elevated with fluctuations into the 120s. SBP is stable in the 140s. She has a PPM. I have ordered ER RN to increase IV Cardizem drip to 15 mg/hr. She was evaluated by EP last week. Plan is for ablation, however the initial plan was to wait at least 3 months due to her recently implanted PPM. Plan to admit tonight and will re-consult EP in the AM as she has failed multiple antiarrhythmics.   Lyda Jester 01/27/2013, 7:09 PM    Attending note Patient seen and discussed with  NP Rosita Fire. 68 yo female with history of tachy-brady syndrome with recent pacemaker placement, as well as paroxysmal afib with failed treatment on flecanide and sotalol. Amiodarone developed sever itching and was stopped. Recent started on propafenone and has received a total of 3 doses, presents  with afib with RVR. She had been on a fairly aggressive rhythm control strategy with Toprol XL and dilt prior to trying antiarrhythmics and did not achieve control. Will manage with IV dilt drip overnight for rate control, consult EP in the morning for assistance in her difficult to manage afib. Continue elaquis.   Carlyle Dolly MD

## 2013-01-28 ENCOUNTER — Encounter: Payer: Medicare Other | Admitting: Cardiology

## 2013-01-28 ENCOUNTER — Encounter (HOSPITAL_COMMUNITY): Payer: Self-pay | Admitting: *Deleted

## 2013-01-28 DIAGNOSIS — I495 Sick sinus syndrome: Secondary | ICD-10-CM | POA: Diagnosis not present

## 2013-01-28 DIAGNOSIS — I6350 Cerebral infarction due to unspecified occlusion or stenosis of unspecified cerebral artery: Secondary | ICD-10-CM

## 2013-01-28 DIAGNOSIS — I4891 Unspecified atrial fibrillation: Secondary | ICD-10-CM | POA: Diagnosis not present

## 2013-01-28 DIAGNOSIS — I509 Heart failure, unspecified: Secondary | ICD-10-CM | POA: Diagnosis not present

## 2013-01-28 DIAGNOSIS — I5030 Unspecified diastolic (congestive) heart failure: Secondary | ICD-10-CM | POA: Diagnosis not present

## 2013-01-28 DIAGNOSIS — Z7901 Long term (current) use of anticoagulants: Secondary | ICD-10-CM

## 2013-01-28 LAB — CBC
HEMATOCRIT: 41.5 % (ref 36.0–46.0)
Hemoglobin: 14.1 g/dL (ref 12.0–15.0)
MCH: 31 pg (ref 26.0–34.0)
MCHC: 34 g/dL (ref 30.0–36.0)
MCV: 91.2 fL (ref 78.0–100.0)
Platelets: 213 10*3/uL (ref 150–400)
RBC: 4.55 MIL/uL (ref 3.87–5.11)
RDW: 12.6 % (ref 11.5–15.5)
WBC: 7.4 10*3/uL (ref 4.0–10.5)

## 2013-01-28 LAB — BASIC METABOLIC PANEL
BUN: 11 mg/dL (ref 6–23)
CHLORIDE: 102 meq/L (ref 96–112)
CO2: 24 mEq/L (ref 19–32)
CREATININE: 0.69 mg/dL (ref 0.50–1.10)
Calcium: 9 mg/dL (ref 8.4–10.5)
GFR calc non Af Amer: 88 mL/min — ABNORMAL LOW (ref >90)
Glucose, Bld: 159 mg/dL — ABNORMAL HIGH (ref 70–99)
Potassium: 3.7 mEq/L (ref 3.7–5.3)
Sodium: 140 mEq/L (ref 137–147)

## 2013-01-28 LAB — GLUCOSE, CAPILLARY
GLUCOSE-CAPILLARY: 137 mg/dL — AB (ref 70–99)
Glucose-Capillary: 173 mg/dL — ABNORMAL HIGH (ref 70–99)
Glucose-Capillary: 148 mg/dL — ABNORMAL HIGH (ref 70–99)
Glucose-Capillary: 156 mg/dL — ABNORMAL HIGH (ref 70–99)
Glucose-Capillary: 187 mg/dL — ABNORMAL HIGH (ref 70–99)

## 2013-01-28 LAB — MRSA PCR SCREENING: MRSA by PCR: NEGATIVE

## 2013-01-28 MED ORDER — PANTOPRAZOLE SODIUM 40 MG PO TBEC: 40.0000 mg | DELAYED_RELEASE_TABLET | Freq: Every day | ORAL | Status: DC | PRN

## 2013-01-28 MED ORDER — PROPAFENONE HCL ER 225 MG PO CP12
450.0000 mg | ORAL_CAPSULE | Freq: Two times a day (BID) | ORAL | Status: DC
  Administered 2013-01-28 (×2): 450 mg via ORAL
  Filled 2013-01-28 (×5): qty 2

## 2013-01-28 MED ORDER — SIMVASTATIN 40 MG PO TABS
40.0000 mg | ORAL_TABLET | Freq: Every day | ORAL | Status: DC
  Filled 2013-01-28: qty 1

## 2013-01-28 MED ORDER — PROPAFENONE HCL ER 425 MG PO CP12
425.0000 mg | ORAL_CAPSULE | Freq: Two times a day (BID) | ORAL | Status: DC
  Filled 2013-01-28 (×2): qty 1

## 2013-01-28 MED ORDER — ACETAMINOPHEN 325 MG PO TABS
650.0000 mg | ORAL_TABLET | ORAL | Status: DC | PRN
  Administered 2013-01-28: 650 mg via ORAL
  Filled 2013-01-28: qty 2

## 2013-01-28 MED ORDER — INSULIN ASPART 100 UNIT/ML ~~LOC~~ SOLN
0.0000 [IU] | Freq: Three times a day (TID) | SUBCUTANEOUS | Status: DC
  Administered 2013-01-28: 2 [IU] via SUBCUTANEOUS
  Administered 2013-01-28 (×2): 1 [IU] via SUBCUTANEOUS
  Administered 2013-01-29: 17:00:00 via SUBCUTANEOUS
  Administered 2013-01-29 – 2013-01-30 (×2): 2 [IU] via SUBCUTANEOUS

## 2013-01-28 MED ORDER — ONDANSETRON HCL 4 MG/2ML IJ SOLN: 4.0000 mg | Freq: Four times a day (QID) | INTRAMUSCULAR | Status: DC | PRN

## 2013-01-28 MED ORDER — FAMOTIDINE 20 MG PO TABS
20.0000 mg | ORAL_TABLET | ORAL | Status: DC | PRN
  Filled 2013-01-28: qty 1

## 2013-01-28 MED ORDER — METFORMIN HCL ER 500 MG PO TB24
500.0000 mg | ORAL_TABLET | Freq: Every day | ORAL | Status: DC
  Administered 2013-01-28 – 2013-01-30 (×3): 500 mg via ORAL
  Filled 2013-01-28 (×5): qty 1

## 2013-01-28 MED ORDER — INSULIN ASPART 100 UNIT/ML ~~LOC~~ SOLN: 0.0000 [IU] | Freq: Every day | SUBCUTANEOUS | Status: DC

## 2013-01-28 MED ORDER — PRAVASTATIN SODIUM 40 MG PO TABS
80.0000 mg | ORAL_TABLET | Freq: Every day | ORAL | Status: DC
  Administered 2013-01-28 – 2013-01-29 (×2): 80 mg via ORAL
  Filled 2013-01-28 (×4): qty 2

## 2013-01-28 NOTE — Progress Notes (Signed)
UR completed 

## 2013-01-28 NOTE — Progress Notes (Signed)
Patient: Nina Carpenter Date of Encounter: 01/28/2013, 7:32 AM Admit date: 01/27/2013     Subjective  Mrs. Goodnough has no complaints currently. Yesterday she "just didn't feel good" and knew she was in AF. Rates remained elevated so came to ED. Overnight states she slept well. Rates improved on IV diltiazem. She denies CP or SOB.    Objective  Physical Exam: Vitals: BP 122/60  Pulse 62  Temp(Src) 98 F (36.7 C) (Oral)  Resp 24  Ht 5\' 3"  (1.6 m)  Wt 183 lb 6.8 oz (83.2 kg)  BMI 32.50 kg/m2  SpO2 95% General: Well developed, well appearing 68 year old female in no acute distress. Neck: Supple. JVD not elevated. Lungs: Clear bilaterally to auscultation without wheezes, rales, or rhonchi. Breathing is unlabored. Heart: Irregular S1 S2 without murmurs, rubs, or gallops.  Abdomen: Soft, non-distended. Extremities: No clubbing or cyanosis. No edema.  Distal pedal pulses are 2+ and equal bilaterally. Neuro: Alert and oriented X 3. Moves all extremities spontaneously. No focal deficits.  Intake/Output:  Intake/Output Summary (Last 24 hours) at 01/28/13 0732 Last data filed at 01/28/13 0600  Gross per 24 hour  Intake     90 ml  Output      0 ml  Net     90 ml    Inpatient Medications:  . apixaban  5 mg Oral BID  . insulin aspart  0-5 Units Subcutaneous QHS  . insulin aspart  0-9 Units Subcutaneous TID WC  . losartan  100 mg Oral QPM  . metFORMIN  500 mg Oral Q breakfast  . simvastatin  40 mg Oral q1800   . diltiazem (CARDIZEM) infusion 15 mg/hr (01/27/13 1909)    Labs:  Recent Labs  01/27/13 1532 01/28/13 0450  NA 138 140  K 4.5 3.7  CL 100 102  CO2 17* 24  GLUCOSE 202* 159*  BUN 13 11  CREATININE 0.94 0.69  CALCIUM 9.2 9.0    Recent Labs  01/27/13 1532 01/28/13 0450  WBC 7.3 7.4  HGB 15.6* 14.1  HCT 44.3 41.5  MCV 91.7 91.2  PLT 194 213    Radiology/Studies: Dg Chest 2 View  01/27/2013   CLINICAL DATA:  Atrial fibrillation, hypertension, diabetes,  pacemaker, asthma, history stroke, diastolic CHF  EXAM: CHEST  2 VIEW  COMPARISON:  01/11/2013  FINDINGS: Left subclavian transvenous pacemaker leads project at right atrium and right ventricle.  Enlargement of cardiac silhouette with slight pulmonary vascular congestion.  Mediastinal contours normal.  Minimal bronchitic changes without infiltrate, pleural effusion or pneumothorax.  Scattered endplate spur formation thoracic spine.  IMPRESSION: Enlargement of cardiac silhouette with pulmonary vascular congestion.  Minimal bronchitic changes.  No acute abnormalities.   Electronically Signed   By: Lavonia Dana M.D.   On: 01/27/2013 17:39   Echocardiogram Dec 29, 2012 Study Conclusions Left ventricle: The cavity size was normal. Wall thickness was normal. Systolic function was mildly reduced. The estimated ejection fraction was in the range of 45% to 50%. Wall motion was normal; there were no regional wall motion abnormalities.   Telemetry: AFib with V rates 80-90 bpm currently   Assessment and Plan  1. Atrial fibrillation - recurrent despite multiple AADs in the last month - failed flecainide, sotalol and had allergic reaction to amiodarone with intense itching - now on Rythmol although has only had a total of 3 doses; if considered Rythmol failure, only AAD left to try would be Tikosyn - plan to refer to Dr.  Shatasha Lambing for consideration of AFib ablation - does not have anginal symptoms; however, multiple risk factors for CAD so consideration should be given to stress testing (when okay from Neuro / CVA standpoint) for cardiac risk stratification to determine likelihood of obstructive CAD as cause for refractory AFib 2. Mild LV dysfunction, EF 45-50% 3. Tachy-brady syndrome s/p PPM implant 12/30/2012 4. Recent CVA 5. HTN 6. Dyslipidemia 7. DM  Dr. Rayann Heman to see Signed, EDMISTEN, BROOKE PA-C  I have seen, examined the patient, and reviewed the above assessment and plan.  Changes to above are  made where necessary.  The patient has recurrent symptomatic afib.  She has had a recent stroke and PPM implant and therefore is not presently a good candidate for ablation.  Hopefully we can control her afib with medicine but if not, we could consider ablation down the road. She has failed medical therapy with flecainide, sotalol, and amiodarone.  She just started rhythmol and has only received 3 doses.  At this point, I would advise increasing rhythmol to 425mg  BID.  If she does fail medical therapy with rhythmol then we will consider tikosyn as our next option though this would require that rhythmol wash out for several days. Continue eliquis for chads2vasc score of at least 7  Co Sign: Thompson Grayer, MD 01/28/2013 8:21 AM

## 2013-01-28 NOTE — Progress Notes (Signed)
Pt's CBG upon arrival to floor 173 with order to call MD if >140 to put on SSI. Dr. Inda Castle called and new order placed.

## 2013-01-29 ENCOUNTER — Telehealth: Payer: Self-pay | Admitting: Internal Medicine

## 2013-01-29 ENCOUNTER — Ambulatory Visit: Payer: Medicare Other | Admitting: Internal Medicine

## 2013-01-29 DIAGNOSIS — I4891 Unspecified atrial fibrillation: Secondary | ICD-10-CM | POA: Diagnosis not present

## 2013-01-29 LAB — BASIC METABOLIC PANEL
BUN: 14 mg/dL (ref 6–23)
CHLORIDE: 103 meq/L (ref 96–112)
CO2: 22 meq/L (ref 19–32)
Calcium: 9 mg/dL (ref 8.4–10.5)
Creatinine, Ser: 0.93 mg/dL (ref 0.50–1.10)
GFR calc Af Amer: 72 mL/min — ABNORMAL LOW (ref >90)
GFR calc non Af Amer: 62 mL/min — ABNORMAL LOW (ref >90)
Glucose, Bld: 134 mg/dL — ABNORMAL HIGH (ref 70–99)
POTASSIUM: 3.9 meq/L (ref 3.7–5.3)
Sodium: 139 mEq/L (ref 137–147)

## 2013-01-29 LAB — CBC
HEMATOCRIT: 40.1 % (ref 36.0–46.0)
Hemoglobin: 13.4 g/dL (ref 12.0–15.0)
MCH: 30.7 pg (ref 26.0–34.0)
MCHC: 33.4 g/dL (ref 30.0–36.0)
MCV: 92 fL (ref 78.0–100.0)
Platelets: 205 10*3/uL (ref 150–400)
RBC: 4.36 MIL/uL (ref 3.87–5.11)
RDW: 12.8 % (ref 11.5–15.5)
WBC: 7.4 10*3/uL (ref 4.0–10.5)

## 2013-01-29 LAB — GLUCOSE, CAPILLARY
GLUCOSE-CAPILLARY: 174 mg/dL — AB (ref 70–99)
GLUCOSE-CAPILLARY: 157 mg/dL — AB (ref 70–99)
Glucose-Capillary: 138 mg/dL — ABNORMAL HIGH (ref 70–99)
Glucose-Capillary: 213 mg/dL — ABNORMAL HIGH (ref 70–99)
Glucose-Capillary: 162 mg/dL — ABNORMAL HIGH (ref 70–99)

## 2013-01-29 MED ORDER — PROPAFENONE HCL ER 225 MG PO CP12
450.0000 mg | ORAL_CAPSULE | Freq: Two times a day (BID) | ORAL | Status: DC
  Administered 2013-01-29 – 2013-01-30 (×3): 450 mg via ORAL
  Filled 2013-01-29 (×3): qty 2

## 2013-01-29 MED ORDER — PROPAFENONE HCL ER 425 MG PO CP12
425.0000 mg | ORAL_CAPSULE | Freq: Two times a day (BID) | ORAL | Status: DC
  Filled 2013-01-29 (×2): qty 1

## 2013-01-29 NOTE — Telephone Encounter (Signed)
New message        Pt has to order proeafenone (rythmal) and needs to know how many a day she needs to take.

## 2013-01-29 NOTE — Telephone Encounter (Signed)
Patient's husband aware it will be 450mg  bid

## 2013-01-29 NOTE — Progress Notes (Signed)
Patient: Nina Carpenter Date of Encounter: 01/29/2013, 7:14 AM Admit date: 01/27/2013     Subjective  Mrs. Mosso has no complaints currently. She denies CP, SOB or palpitations. Back in NSR   Objective  Physical Exam: Vitals: BP 108/53  Pulse 60  Temp(Src) 97.8 F (36.6 C) (Oral)  Resp 26  Ht 5\' 3"  (1.6 m)  Wt 184 lb 4.9 oz (83.6 kg)  BMI 32.66 kg/m2  SpO2 96% General: Well developed, well appearing 68 year old female in no acute distress. Neck: Supple. JVD not elevated. Lungs: Clear bilaterally to auscultation without wheezes, rales, or rhonchi. Breathing is unlabored. Heart: Irregular S1 S2 without murmurs, rubs, or gallops.  Abdomen: Soft, non-distended. Extremities: No clubbing or cyanosis. No edema.  Distal pedal pulses are 2+ and equal bilaterally. Neuro: Alert and oriented X 3. Moves all extremities spontaneously. No focal deficits.  Intake/Output:  Intake/Output Summary (Last 24 hours) at 01/29/13 0714 Last data filed at 01/29/13 3500  Gross per 24 hour  Intake   1630 ml  Output   1000 ml  Net    630 ml    Inpatient Medications:  . apixaban  5 mg Oral BID  . insulin aspart  0-5 Units Subcutaneous QHS  . insulin aspart  0-9 Units Subcutaneous TID WC  . losartan  100 mg Oral QPM  . metFORMIN  500 mg Oral Q breakfast  . pravastatin  80 mg Oral q1800  . propafenone  450 mg Oral Q12H   . diltiazem (CARDIZEM) infusion 10 mg/hr (01/29/13 0625)    Labs:  Recent Labs  01/28/13 0450 01/29/13 0240  NA 140 139  K 3.7 3.9  CL 102 103  CO2 24 22  GLUCOSE 159* 134*  BUN 11 14  CREATININE 0.69 0.93  CALCIUM 9.0 9.0    Recent Labs  01/28/13 0450 01/29/13 0240  WBC 7.4 7.4  HGB 14.1 13.4  HCT 41.5 40.1  MCV 91.2 92.0  PLT 213 205    Radiology/Studies: Dg Chest 2 View  01/27/2013   CLINICAL DATA:  Atrial fibrillation, hypertension, diabetes, pacemaker, asthma, history stroke, diastolic CHF  EXAM: CHEST  2 VIEW  COMPARISON:  01/11/2013  FINDINGS:  Left subclavian transvenous pacemaker leads project at right atrium and right ventricle.  Enlargement of cardiac silhouette with slight pulmonary vascular congestion.  Mediastinal contours normal.  Minimal bronchitic changes without infiltrate, pleural effusion or pneumothorax.  Scattered endplate spur formation thoracic spine.  IMPRESSION: Enlargement of cardiac silhouette with pulmonary vascular congestion.  Minimal bronchitic changes.  No acute abnormalities.   Electronically Signed   By: Lavonia Dana M.D.   On: 01/27/2013 17:39   Echocardiogram Dec 29, 2012 Study Conclusions Left ventricle: The cavity size was normal. Wall thickness was normal. Systolic function was mildly reduced. The estimated ejection fraction was in the range of 45% to 50%. Wall motion was normal; there were no regional wall motion abnormalities.   Telemetry: NSR   Assessment and Plan  1. Atrial fibrillation - recurrent despite multiple AADs in the last month - failed flecainide, sotalol and had allergic reaction to amiodarone with intense itching - now on Rythmol and Dr. Rayann Heman recommended continuing this for now; stop IV diltiazem - not a candidate for AFib ablation at this time - does not have anginal symptoms; however, multiple risk factors for CAD so consideration should be given to stress testing (when okay from Neuro / CVA standpoint) for cardiac risk stratification to determine likelihood of obstructive  CAD as cause for refractory AFib; also needs sleep study 2. Mild LV dysfunction, EF 45-50% 3. Tachy-brady syndrome s/p PPM implant 12/30/2012 4. Recent CVA 5. HTN 6. Dyslipidemia 7. DM  Dr. Lovena Le to see Signed, Ileene Hutchinson PA-C  ADDENDUM: Called by RN at 9:56 AM on behalf of [facility] does not have Rhythmol SR 425 mg and according to RN will take several days if ordered. Pharmacy requesting if okay to give Rhythmol SR 225 mg 2 capsules twice daily instead. Apparently this was done yesterday  as per Dr. Rayann Heman. Therefore, I have changed the order to reflect patient given 225 mg, 2 capsules twice daily, while in the hospital.   EP Attending  Patient seen and examined. Agree with above. She has reverted to NSR on Propafenone which she appears to be tolerating very nicely. Will plan to discharge home tomorrow morning and she will need close followup including exercise treadmill testing in the next 2 weeks. Follow ECG carefully. Will try to obtain 425 mg twice daily of Rhythmol.  Mikle Bosworth.D.

## 2013-01-30 ENCOUNTER — Other Ambulatory Visit: Payer: Self-pay | Admitting: Cardiology

## 2013-01-30 DIAGNOSIS — R0600 Dyspnea, unspecified: Secondary | ICD-10-CM

## 2013-01-30 DIAGNOSIS — I4891 Unspecified atrial fibrillation: Secondary | ICD-10-CM

## 2013-01-30 DIAGNOSIS — I495 Sick sinus syndrome: Secondary | ICD-10-CM | POA: Diagnosis not present

## 2013-01-30 DIAGNOSIS — I509 Heart failure, unspecified: Secondary | ICD-10-CM | POA: Diagnosis not present

## 2013-01-30 DIAGNOSIS — I5030 Unspecified diastolic (congestive) heart failure: Secondary | ICD-10-CM | POA: Diagnosis not present

## 2013-01-30 LAB — CBC
HEMATOCRIT: 39.8 % (ref 36.0–46.0)
Hemoglobin: 13.7 g/dL (ref 12.0–15.0)
MCH: 31.1 pg (ref 26.0–34.0)
MCHC: 34.4 g/dL (ref 30.0–36.0)
MCV: 90.5 fL (ref 78.0–100.0)
PLATELETS: 195 10*3/uL (ref 150–400)
RBC: 4.4 MIL/uL (ref 3.87–5.11)
RDW: 12.6 % (ref 11.5–15.5)
WBC: 6.7 10*3/uL (ref 4.0–10.5)

## 2013-01-30 LAB — BASIC METABOLIC PANEL
BUN: 14 mg/dL (ref 6–23)
CHLORIDE: 103 meq/L (ref 96–112)
CO2: 24 meq/L (ref 19–32)
CREATININE: 0.81 mg/dL (ref 0.50–1.10)
Calcium: 9.2 mg/dL (ref 8.4–10.5)
GFR calc Af Amer: 85 mL/min — ABNORMAL LOW (ref >90)
GFR calc non Af Amer: 73 mL/min — ABNORMAL LOW (ref >90)
Glucose, Bld: 127 mg/dL — ABNORMAL HIGH (ref 70–99)
POTASSIUM: 4.3 meq/L (ref 3.7–5.3)
Sodium: 140 mEq/L (ref 137–147)

## 2013-01-30 LAB — GLUCOSE, CAPILLARY: Glucose-Capillary: 151 mg/dL — ABNORMAL HIGH (ref 70–99)

## 2013-01-30 MED ORDER — PROPAFENONE HCL ER 425 MG PO CP12: 425.0000 mg | ORAL_CAPSULE | Freq: Two times a day (BID) | ORAL | Status: DC

## 2013-01-30 NOTE — H&P (Signed)
History and physical   Referring Physician: [facility] ER MD  HPI: Nina Carpenter is a 68 year old female with a past medical history of tachy-brady syndrome, status post pacemaker implant, type 2 diabetes, GERD, hyperlipidemia, and hypertension. She has had recurrent atrial fibrillation on Flecainide and was changed to Sotalol in Dec 2014. Her atrial fibrillation is associated with shortness of breath, palpitations, and fatigue. She had another episode a rapid a-fib several days ago, while on Sotalol. DDCV was planned, however she spontaneously converted back into NSR. She was seen by EP on 01/22/13. She was evaluated at that time by Dr. Caryl Comes. Due to recurrent breakthrough AF, both with flecainide and with sotalol, it was decided that she should undergo catheter ablation. However, Dr. Caryl Comes felt that it was best to delay the procedure, due to her newly implanted PPM. In the interim, she was started on Amiodarone. However, this was recently discontinued after she developed an intolerance (itching). She was placed on Rythmol, which was started 2 days ago.  She returns today to the [facility] ER with yet again another episode of atrial fibrillation w/ RVR. Ventricular rate has been as high as the 140s. She feels a bit nauseated, but is otherwise asymptomatic. She denies SOB, chest pain, palpitations, dizziness, syncope/ near syncope. She has been started on IV Cardizem. HR is slightly improved, but still fluctuating up into the 120s. BP is stable.  She reports being compliant with all of her prescribed medications. She has not missed any scheduled doses of any her the antiarrthymics previously ordered. She is also compliant with Eliquis.  Past Medical History   Diagnosis  Date   .  Allergy    .  Asthma      a. Remotely.   .  Type 2 diabetes mellitus    .  GERD (gastroesophageal reflux disease)    .  Hyperlipidemia    .  Hypertension    .  Kidney stones      a. Remotely.   Marland Kitchen  Hx of rheumatic fever      a. As a  child.   Marland Kitchen  PAF (paroxysmal atrial fibrillation)      a. Dx 09/2012, chronicity unclear at that time. Initially refused Xarelto due to risk of bleeding. b. Sustained CVA 12/2012 - agreeable to taking Eliquis. Tachybrady syndrome during that admission s/p Medtronic PPM implantation; c. 12/2012 sotalol initiated.   .  Obesity    .  CVA (cerebral vascular accident)      a. Thromboemoblic L MCA CVA 09/6043 with no residual deficit except mild occasional word finding. b. 4-09% RICA/LICA by dopplers 81/1/91.   .  Tachy-brady syndrome      a. AF RVR with pauses >5sec requiring Medtronic PPM 12/2012.   .  H/O medication noncompliance      a. Per PCP note 01/06/13: Previously refused anything greater than aspirin, refused treatment for DM, and often stopped hypertensive meds and lipid meds.   .  Diastolic CHF      a. Felt related to AF. EF 45-50% by echo 12/2012.   .  LV dysfunction      a. EF 45-50% by echo 12/29/12.   .  Acid reflux disease     Past Surgical History   Procedure  Laterality  Date   .  Cholecystectomy     .  Tubal ligation       ectopic preganacy   .  Destruction trigeminal nerve via neurolytic agent  NOv @013      Gamma Knife   .  Pacemaker insertion   12-30-12     MDT dual chamber pacemaker implanted by Dr Caryl Comes for tachy-brady syndrome    Family History   Problem  Relation  Age of Onset   .  Stroke  Mother    .  Diabetes  Mother     Social History: reports that she quit smoking about 45 years ago. Her smoking use included Cigarettes. She smoked 2.00 packs per day. She has never used smokeless tobacco. She reports that she drinks about 0.6 ounces of alcohol per week. She reports that she does not use illicit drugs.  Allergies:  Allergies   Allergen  Reactions   .  Amiodarone  Itching   .  Crestor [Rosuvastatin]      myalgia   .  Fish Allergy  Swelling     Shell fish   .  Lipitor [Atorvastatin]      myalgias   .  Penicillins  Hives and Itching   .  Ace Inhibitors   Cough    Medications:  Prior to Admission medications   Medication  Sig  Start Date  End Date  Taking?  Authorizing Provider   apixaban (ELIQUIS) 5 MG TABS tablet  Take 1 tablet (5 mg total) by mouth 2 (two) times daily.  01/03/13   Yes  Cherene Altes, MD   diltiazem (DILACOR XR) 120 MG 24 hr capsule  Take 120 mg by mouth daily as needed (high heart rate).    Yes  Historical Provider, MD   famotidine (PEPCID) 20 MG tablet  Take 20 mg by mouth as needed for indigestion.    Yes  Historical Provider, MD   losartan (COZAAR) 100 MG tablet  Take 100 mg by mouth every evening.    Yes  Historical Provider, MD   metFORMIN (GLUCOPHAGE XR) 500 MG 24 hr tablet  Take 1 tablet (500 mg total) by mouth daily with breakfast.  01/06/13   Yes  Spencer [name], MD   pantoprazole (PROTONIX) 40 MG tablet  Take 40 mg by mouth daily as needed (indigestion).    Yes  Historical Provider, MD   pravastatin (PRAVACHOL) 80 MG tablet  Take 1 tablet (80 mg total) by mouth daily.  01/01/13   Yes  Cherene Altes, MD   propafenone (RYTHMOL) 300 MG tablet  Take 1 tablet (300 mg total) by mouth 2 (two) times daily.  01/23/13   Yes  Rogelia Mire, Nina Carpenter    Results for orders placed during the hospital encounter of 01/27/13 (from the past 48 hour(s))   CBC Status: Abnormal    Collection Time    01/27/13 3:32 PM   Result  Value  Range    WBC  7.3  4.0 - 10.5 K/uL    RBC  4.83  3.87 - 5.11 MIL/uL    Hemoglobin  15.6 (*)  12.0 - 15.0 g/dL    HCT  44.3  36.0 - 46.0 %    MCV  91.7  78.0 - 100.0 fL    MCH  32.3  26.0 - 34.0 pg    MCHC  35.2  30.0 - 36.0 g/dL    RDW  12.6  11.5 - 15.5 %    Platelets  194  150 - 400 K/uL   BASIC METABOLIC PANEL Status: Abnormal    Collection Time    01/27/13 3:32 PM   Result  Value  Range  Sodium  138  137 - 147 mEq/L    Potassium  4.5  3.7 - 5.3 mEq/L    Chloride  100  96 - 112 mEq/L    CO2  17 (*)  19 - 32 mEq/L    Glucose, Bld  202 (*)  70 - 99 mg/dL    BUN  13  6 - 23 mg/dL     Creatinine, Ser  0.94  0.50 - 1.10 mg/dL    Calcium  9.2  8.4 - 10.5 mg/dL    GFR calc non Af Amer  61 (*)  >90 mL/min    GFR calc Af Amer  71 (*)  >90 mL/min    Comment:  (NOTE)     The eGFR has been calculated using the CKD EPI equation.     This calculation has not been validated in all clinical situations.     eGFR's persistently <90 mL/min signify possible Chronic Kidney     Disease.   POCT I-STAT TROPONIN I Status: None    Collection Time    01/27/13 3:46 PM   Result  Value  Range    Troponin i, poc  0.00  0.00 - 0.08 ng/mL    Comment 3      Comment:  Due to the release kinetics of cTnI,     a negative result within the first hours     of the onset of symptoms does not rule out     myocardial infarction with certainty.     If myocardial infarction is still suspected,     repeat the test at appropriate intervals.    Dg Chest 2 View  01/27/2013 CLINICAL DATA: Atrial fibrillation, hypertension, diabetes, pacemaker, asthma, history stroke, diastolic CHF EXAM: CHEST 2 VIEW COMPARISON: 01/11/2013 FINDINGS: Left subclavian transvenous pacemaker leads project at right atrium and right ventricle. Enlargement of cardiac silhouette with slight pulmonary vascular congestion. Mediastinal contours normal. Minimal bronchitic changes without infiltrate, pleural effusion or pneumothorax. Scattered endplate spur formation thoracic spine. IMPRESSION: Enlargement of cardiac silhouette with pulmonary vascular congestion. Minimal bronchitic changes. No acute abnormalities. Electronically Signed By: Lavonia Dana M.D. On: 01/27/2013 17:39   Review of Systems  Respiratory: Negative for shortness of breath.  Cardiovascular: Negative for chest pain and palpitations.  Gastrointestinal: Positive for nausea. Negative for vomiting.  Neurological: Negative for dizziness and loss of consciousness.  All other systems reviewed and are negative.   Blood pressure 153/91, pulse 109, temperature 97.9 F (36.6 C),  temperature source Oral, resp. rate 18, SpO2 98.00%.  Physical Exam  Constitutional: She is oriented to person, place, and time. She appears well-developed and well-nourished. No distress.  Neck: No JVD present. Carotid bruit is not present.  Cardiovascular: Intact distal pulses. Exam reveals no gallop and no friction rub.  No murmur heard.  Pulses:  Radial pulses are 2+ on the right side, and 2+ on the left side.  Dorsalis pedis pulses are 2+ on the right side, and 2+ on the left side.  Respiratory: Effort normal and breath sounds normal. No respiratory distress. She has no wheezes. She has no rales.  Musculoskeletal: She exhibits no edema.  Neurological: She is alert and oriented to person, place, and time.  Skin: Skin is warm and dry. She is not diaphoretic.  Psychiatric: She has a normal mood and affect. Her behavior is normal.   Assessment/Plan:  Principal Problem:  Atrial fibrillation with rapid ventricular response  Active Problems:  Chronic anticoagulation- Eliquis   Plan: 68  y/o female with persistent breakthrough atrial fibrillation, failing multiple antiarrhythmics including flecainide, sotalol, Amiodarone and now Rythmol. IV Cardizem started for A-fib w/ RVR. Ventricular rate slightly improved, but still elevated with fluctuations into the 120s. SBP is stable in the 140s. She has a PPM. I have ordered ER RN to increase IV Cardizem drip to 15 mg/hr. She was evaluated by EP last week. Plan is for ablation, however the initial plan was to wait at least 3 months due to her recently implanted PPM. Plan to admit tonight and will re-consult EP in the AM as she has failed multiple antiarrhythmics.  Nina Carpenter  01/27/2013, 7:09 PM    Attending note  Patient seen and discussed with Nina Carpenter Simmons. 68 yo female with history of tachy-brady syndrome with recent pacemaker placement, as well as paroxysmal afib with failed treatment on flecanide and sotalol. Amiodarone developed sever  itching and was stopped. Recent started on propafenone and has received a total of 3 doses, presents with afib with RVR. She had been on a fairly aggressive rhythm control strategy with Toprol XL and dilt prior to trying antiarrhythmics and did not achieve control. Will manage with IV dilt drip overnight for rate control, consult EP in the morning for assistance in her difficult to manage afib. Continue elaquis.  Nina Dolly MD

## 2013-01-30 NOTE — Progress Notes (Signed)
Patient: Nina Carpenter Date of Encounter: 01/30/2013, 7:44 AM Admit date: 01/27/2013     Subjective  Mrs. Pitstick has no complaints. She denies CP, SOB or palpitations. She remains in NSR.   Objective  Physical Exam: Vitals: BP 149/66  Pulse 73  Temp(Src) 98.1 F (36.7 C) (Oral)  Resp 17  Ht 5\' 3"  (1.6 m)  Wt 185 lb 3 oz (84 kg)  BMI 32.81 kg/m2  SpO2 98% General: Well developed, well appearing 68 year old female in no acute distress. Neck: Supple. JVD not elevated. Lungs: Clear bilaterally to auscultation without wheezes, rales, or rhonchi. Breathing is unlabored. Heart: Regular S1 S2 without murmurs, rubs, or gallops.  Abdomen: Soft, non-distended. Extremities: No clubbing or cyanosis. No edema.  Distal pedal pulses are 2+ and equal bilaterally. Neuro: Alert and oriented X 3. Moves all extremities spontaneously. No focal deficits.  Intake/Output:  Intake/Output Summary (Last 24 hours) at 01/30/13 0744 Last data filed at 01/29/13 2130  Gross per 24 hour  Intake    260 ml  Output    950 ml  Net   -690 ml    Inpatient Medications:  . apixaban  5 mg Oral BID  . insulin aspart  0-5 Units Subcutaneous QHS  . insulin aspart  0-9 Units Subcutaneous TID WC  . losartan  100 mg Oral QPM  . metFORMIN  500 mg Oral Q breakfast  . pravastatin  80 mg Oral q1800  . propafenone  450 mg Oral Q12H    Labs:  Recent Labs  01/29/13 0240 01/30/13 0316  NA 139 140  K 3.9 4.3  CL 103 103  CO2 22 24  GLUCOSE 134* 127*  BUN 14 14  CREATININE 0.93 0.81  CALCIUM 9.0 9.2    Recent Labs  01/29/13 0240 01/30/13 0316  WBC 7.4 6.7  HGB 13.4 13.7  HCT 40.1 39.8  MCV 92.0 90.5  PLT 205 195    Radiology/Studies: Dg Chest 2 View  01/27/2013   CLINICAL DATA:  Atrial fibrillation, hypertension, diabetes, pacemaker, asthma, history stroke, diastolic CHF  EXAM: CHEST  2 VIEW  COMPARISON:  01/11/2013  FINDINGS: Left subclavian transvenous pacemaker leads project at right atrium and  right ventricle.  Enlargement of cardiac silhouette with slight pulmonary vascular congestion.  Mediastinal contours normal.  Minimal bronchitic changes without infiltrate, pleural effusion or pneumothorax.  Scattered endplate spur formation thoracic spine.  IMPRESSION: Enlargement of cardiac silhouette with pulmonary vascular congestion.  Minimal bronchitic changes.  No acute abnormalities.   Electronically Signed   By: Lavonia Dana M.D.   On: 01/27/2013 17:39   Echocardiogram Dec 29, 2012 Study Conclusions Left ventricle: The cavity size was normal. Wall thickness was normal. Systolic function was mildly reduced. The estimated ejection fraction was in the range of 45% to 50%. Wall motion was normal; there were no regional wall motion abnormalities.   Telemetry: NSR   Assessment and Plan  1. Atrial fibrillation - recurrent despite multiple AADs in the last month - failed flecainide, sotalol and had allergic reaction to amiodarone with intense itching - now on Rythmol and Dr. Rayann Heman recommended continuing this for now at 425 mg twice daily (see Pharmacy note below) - not a candidate for AFib ablation at this time - does not have anginal symptoms; however, multiple risk factors for CAD so consideration should be given to stress testing (when okay from Neuro / CVA standpoint) for cardiac risk stratification to determine likelihood of obstructive CAD as cause  for refractory AFib; also needs sleep study - repeat ECG this AM 2. Mild LV dysfunction, EF 45-50% 3. Tachy-brady syndrome s/p PPM implant 12/30/2012 4. Recent CVA 5. HTN 6. Dyslipidemia 7. DM  Dr. Caryl Comes to see Signed, Ileene Hutchinson PA-C  ADDENDUM: Called by RN at 9:56 AM on behalf of [facility] does not have Rhythmol SR 425 mg and according to RN will take several days if ordered. Pharmacy requesting if okay to give Rhythmol SR 225 mg 2 capsules twice daily instead. Apparently this was done yesterday as per Dr. Rayann Heman.  Therefore, I have changed the order to reflect patient given 225 mg, 2 capsules twice daily, while in the hospital.   Plan is discharge to home Myoview.stress for IC therapy Sleep study appt with JA 8-10 weeks  F/u sk as scheduled

## 2013-01-30 NOTE — Discharge Summary (Signed)
ELECTROPHYSIOLOGY DISCHARGE SUMMARY    Patient ID: Nina Carpenter,  MRN: [id number], DOB/AGE: 10-13-1945 68 y.o.  Admit date: 01/27/2013 Discharge date: 01/30/2013  Primary Care Physician: [name], MD Primary Cardiologist: Jolyn Nap, MD  Primary Discharge Diagnosis:  1. Atrial fibrillation  - recurrent despite multiple AADs in the last month  - failed flecainide, sotalol and had allergic reaction to amiodarone with intense itching  - now on Rythmol and remains in SR; continuing this for now at 425 mg twice daily - not a candidate for AFib ablation at this time  - does not have anginal symptoms; however, multiple risk factors for CAD so consideration should be given to stress testing (when okay from Neuro / CVA standpoint) for cardiac risk stratification to determine likelihood of obstructive CAD as cause for refractory AFib; also needs sleep study   Secondary Discharge Diagnoses:  1. Mild LV dysfunction, EF 45-50%  2. Tachy-brady syndrome s/p PPM implant 12/30/2012  3. Recent CVA  4. HTN  5. Dyslipidemia  6. DM  Procedures This Admission:  None   History: Nina Carpenter is a 68 year old woman with PAF initially diagnosed in September 2014. At that time, she refused anticoagulation and had converted to sinus rhythm by the time she followed-up in clinic in October 2014. Unfortunately, she was admitted to [facility] on 12/28/2012 with confusion and speech deficit and was diagnosed with L MCA stroke. She was back in AF with RVR but was also noted to have >5 second pauses. EP was consulted and a Medtronic PPM was placed for tachy-brady syndrome. She was agreeable to initiate Eliquis therapy prior to discharge. She followed up with EP on 12/18, at which time she reported more symptomatic afib and decision was made to initiate flecainide. Unfortunately, she has been readmitted several times in the last month with recurrent, rapid AF. Over the course of one month, she has failed  flecainide, sotalol and had an allergic reaction to amiodarone. She was started on propafenone most recently. She presented 01/27/2013 with rapid AF and was admitted for further evaluation.   Hospital Course:  She was admitted to [facility] stepdown on 01/27/2013 with rapid AF. She had only taken 3 doses of propafenone and Dr. Rayann Heman recommended continuing this at 425 mg twice daily for now. She is not a candidate for AF ablation at this time. On 01/28/2013 she converted to NSR and remains in NSR on propafenone. She has continued Eliquis without interruption. She remains hemodynamically stable. She has been seen, examined and deemed stable for discharge today by Dr. Caryl Comes. She will be referred for sleep study. She will also have treadmill Myoview stress test and follow-up with Dr. Caryl Comes in 2-3 weeks.    Discharge Vitals: Blood pressure 149/66, pulse 73, temperature 98.1 F (36.7 C), temperature source Oral, resp. rate 17, height 5\' 3"  (1.6 m), weight 185 lb 3 oz (84 kg), SpO2 98.00%.   Labs: Lab Results  Component Value Date   WBC 6.7 01/30/2013   HGB 13.7 01/30/2013   HCT 39.8 01/30/2013   MCV 90.5 01/30/2013   PLT 195 01/30/2013     Recent Labs Lab 01/30/13 0316  NA 140  K 4.3  CL 103  CO2 24  BUN 14  CREATININE 0.81  CALCIUM 9.2  GLUCOSE 127*   Lab Results  Component Value Date   TROPONINI <0.30 01/22/2013    Disposition:  The patient is being discharged in stable condition.  Follow-up:     Follow-up  Information   Follow up with Virl Axe, MD On 02/11/2013. (At 8:30 AM)    Specialty:  Cardiology   Contact information:   [place] [address] [phone]       Follow up with [facility] In 1 week. (Their office will contact you to schedule sleep study)    Contact information:   "[facility] [place], [place] [phone] [email or web address]      Follow up with [facility] On 02/06/2013. (Arrive to hospital at 9:45 AM for stress test (you may not eat or drink after midnight the night before; please take your AM meds with sips of water))    Contact information:   [facility] [place] [address] [phone]       Follow up with Thompson Grayer, MD On 04/03/2013. (At 8:30 AM to discuss possible AFib ablation)    Specialty:  Cardiology   Contact information:   [address] [place] [address] [phone]      Discharge Medications:    Medication List    STOP taking these medications       propafenone 300 MG tablet  Commonly known as:  RYTHMOL  Replaced by:  propafenone 425 MG 12 hr capsule      TAKE these medications       apixaban 5 MG Tabs tablet  Commonly known as:  ELIQUIS  Take 1 tablet (5 mg total) by mouth 2 (two) times daily.     diltiazem 120 MG 24 hr capsule  Commonly known as:  DILACOR XR  Take 120 mg by mouth daily as needed (high heart rate).     famotidine 20 MG tablet  Commonly known as:  PEPCID  Take 20 mg by mouth as needed for indigestion.     losartan 100 MG tablet  Commonly known as:  COZAAR  Take 100 mg by mouth every evening.     metFORMIN 500 MG 24 hr tablet  Commonly known as:  GLUCOPHAGE XR  Take 1 tablet (500 mg total) by mouth daily with breakfast.     pantoprazole 40 MG tablet  Commonly known as:  PROTONIX  Take 40 mg by mouth daily as needed (indigestion).     pravastatin 80 MG tablet  Commonly known as:  PRAVACHOL  Take 1 tablet (80 mg total) by mouth daily.     propafenone 425 MG 12 hr capsule  Commonly known as:  RYTHMOL SR  Take 1 capsule (425 mg total) by mouth 2 (two) times daily.       Duration of Discharge Encounter: Greater than 30 minutes including physician time.  Signed, Ileene Hutchinson, PA-C 01/30/2013, 11:47 AM

## 2013-02-03 ENCOUNTER — Encounter: Payer: Self-pay | Admitting: Nurse Practitioner

## 2013-02-04 ENCOUNTER — Telehealth: Payer: Self-pay

## 2013-02-04 NOTE — Telephone Encounter (Signed)
Spoke w/ Mardene Celeste @ [facility] and asked her to resend screening tool, as we have no record of receiving it.  She will fax to my attention.

## 2013-02-04 NOTE — Telephone Encounter (Signed)
Received paperwork from [facility].  Sent to [employer] for completion. Mardene Celeste would like to try to have paperwork back in the next week if possible.

## 2013-02-04 NOTE — Telephone Encounter (Signed)
Called states she needs a screening tool, faxed here on 01/31/2013. Checking on status. Please call.

## 2013-02-06 ENCOUNTER — Ambulatory Visit: Payer: Self-pay | Admitting: Internal Medicine

## 2013-02-06 DIAGNOSIS — R0609 Other forms of dyspnea: Secondary | ICD-10-CM | POA: Diagnosis not present

## 2013-02-06 DIAGNOSIS — I4891 Unspecified atrial fibrillation: Secondary | ICD-10-CM | POA: Diagnosis not present

## 2013-02-06 DIAGNOSIS — R0989 Other specified symptoms and signs involving the circulatory and respiratory systems: Secondary | ICD-10-CM | POA: Diagnosis not present

## 2013-02-07 ENCOUNTER — Other Ambulatory Visit: Payer: Self-pay

## 2013-02-07 DIAGNOSIS — I4891 Unspecified atrial fibrillation: Secondary | ICD-10-CM

## 2013-02-07 DIAGNOSIS — R0600 Dyspnea, unspecified: Secondary | ICD-10-CM

## 2013-02-11 ENCOUNTER — Telehealth: Payer: Self-pay

## 2013-02-11 ENCOUNTER — Ambulatory Visit (INDEPENDENT_AMBULATORY_CARE_PROVIDER_SITE_OTHER): Payer: Medicare Other | Admitting: Internal Medicine

## 2013-02-11 ENCOUNTER — Encounter: Payer: Self-pay | Admitting: Internal Medicine

## 2013-02-11 VITALS — BP 139/89 | HR 109 | Ht 63.0 in | Wt 187.2 lb

## 2013-02-11 DIAGNOSIS — I495 Sick sinus syndrome: Secondary | ICD-10-CM | POA: Diagnosis not present

## 2013-02-11 DIAGNOSIS — I4891 Unspecified atrial fibrillation: Secondary | ICD-10-CM

## 2013-02-11 LAB — MDC_IDC_ENUM_SESS_TYPE_INCLINIC
Battery Remaining Longevity: 119 mo
Battery Voltage: 3.03 V
Brady Statistic AP VP Percent: 1.04 %
Brady Statistic AS VP Percent: 1.48 %
Brady Statistic AS VS Percent: 54.36 %
Brady Statistic RA Percent Paced: 44.16 %
Lead Channel Impedance Value: 380 Ohm
Lead Channel Impedance Value: 399 Ohm
Lead Channel Impedance Value: 532 Ohm
Lead Channel Pacing Threshold Amplitude: 0.5 V
Lead Channel Pacing Threshold Pulse Width: 0.4 ms
Lead Channel Pacing Threshold Pulse Width: 0.4 ms
Lead Channel Sensing Intrinsic Amplitude: 2 mV
Lead Channel Sensing Intrinsic Amplitude: 9.125 mV
Lead Channel Setting Pacing Amplitude: 3.5 V
Lead Channel Setting Pacing Pulse Width: 0.4 ms
MDC IDC MSMT LEADCHNL RA PACING THRESHOLD AMPLITUDE: 0.625 V
MDC IDC MSMT LEADCHNL RV IMPEDANCE VALUE: 475 Ohm
MDC IDC SESS DTM: 20150120085822
MDC IDC SET LEADCHNL RV PACING AMPLITUDE: 2.5 V
MDC IDC SET LEADCHNL RV SENSING SENSITIVITY: 2 mV
MDC IDC SET ZONE DETECTION INTERVAL: 400 ms
MDC IDC SET ZONE DETECTION INTERVAL: 400 ms
MDC IDC STAT BRADY AP VS PERCENT: 43.12 %
MDC IDC STAT BRADY RV PERCENT PACED: 2.52 %

## 2013-02-11 MED ORDER — METOPROLOL TARTRATE 25 MG PO TABS: ORAL_TABLET | ORAL | Status: DC

## 2013-02-11 MED ORDER — DILTIAZEM HCL ER 120 MG PO CP24: ORAL_CAPSULE | ORAL | Status: DC

## 2013-02-11 NOTE — Patient Instructions (Signed)
Your physician has recommended you make the following change in your medication:   Increase Diltiazem 120 mg. 2 tablets in the morning and 1tablet in the afternoon. If you are not dizzy after 48 hrs please start metoprolol tartrate 25 mg bid.  Stop propafenone    Your physician recommends that you return for lab work on 1/29 Potassium and [place] hospital admission 02/22/13 to start [facility]

## 2013-02-11 NOTE — Telephone Encounter (Signed)
Left message for pt to call back  °

## 2013-02-11 NOTE — Telephone Encounter (Signed)
Informed patient that per Dr. Caryl Comes (via telephone) that as long as patient is not symptomatic it is ok for her to be at 140. Patient instructed to follow medication changes and call us back tomorrow if heart rate elevated. Patient and husband verbalized understanding.

## 2013-02-11 NOTE — Telephone Encounter (Signed)
Pt husband called and has questions about her medications. States HR is 140.

## 2013-02-11 NOTE — Progress Notes (Signed)
Patient Care Team: [name], MD as PCP - General   HPI  Nina Carpenter is a 68 y.o. female Seen in followup for paroxysmal atrial fibrillation and has recurred despite multiple antiarrhythmic drugs. Most recently she was hospitalized and had her propafenone dose increased 325--425 twice a day. She is mild left ventricular dysfunction thought possibly to be related to tachycardia. She has tachybradycardia syndrome pacemaker implantation 12/14 and she has been referred for consideration of ablation for atrial fibrillation substrate with the anticipation that this will be deferred 3-6 months following pacemaker implantation.  Thromboembolic risk profile is notable for prior stroke-2, hypertension-1, diabetes-1, age-6, gender for a CHADS-VASc score of 6  She returns today still in atrial fibrillation. Her heart rates have been less bothersome. She has mild dyspnea and has noted a little bit of edema which is not particularly new  Past Medical History  Diagnosis Date  . Allergy   . Asthma     a. Remotely.  . Type 2 diabetes mellitus   . GERD (gastroesophageal reflux disease)   . Hyperlipidemia   . Hypertension   . Kidney stones     a. Remotely.  Marland Kitchen Hx of rheumatic fever     a. As a child.  Marland Kitchen PAF (paroxysmal atrial fibrillation)     a. Dx 09/2012, chronicity unclear at that time. Initially refused Xarelto due to risk of bleeding. b. Sustained CVA 12/2012 - agreeable to taking Eliquis. Tachybrady syndrome during that admission s/p Medtronic PPM implantation;  c. 12/2012 sotalol initiated.  . Obesity   . CVA (cerebral vascular accident)     a. Thromboemoblic L MCA CVA 62/9528 with no residual deficit except mild occasional word finding. b. 4-13% RICA/LICA by dopplers 24/4/01.  . Tachy-brady syndrome     a. AF RVR with pauses >5sec requiring Medtronic PPM 12/2012.  . H/O medication noncompliance     a. Per PCP note 01/06/13: Previously refused anything greater than aspirin,  refused treatment for DM, and often stopped hypertensive meds and lipid meds.  . Diastolic CHF     a. Felt related to AF. EF 45-50% by echo 12/2012.  . LV dysfunction     a. EF 45-50% by echo 12/29/12.  . Acid reflux disease     Past Surgical History  Procedure Laterality Date  . Cholecystectomy    . Tubal ligation      ectopic preganacy   . Destruction trigeminal nerve via neurolytic agent  NOv @013     Gamma Knife   . Pacemaker insertion  12-30-12    MDT dual chamber pacemaker implanted by Dr Caryl Comes for tachy-brady syndrome  . Insert / replace / remove pacemaker      Current Outpatient Prescriptions  Medication Sig Dispense Refill  . apixaban (ELIQUIS) 5 MG TABS tablet Take 1 tablet (5 mg total) by mouth 2 (two) times daily.  60 tablet  0  . diltiazem (DILACOR XR) 120 MG 24 hr capsule Take 120 mg by mouth daily as needed (high heart rate).      . famotidine (PEPCID) 20 MG tablet Take 20 mg by mouth as needed for indigestion.      Marland Kitchen losartan (COZAAR) 100 MG tablet Take 100 mg by mouth every evening.       . metFORMIN (GLUCOPHAGE XR) 500 MG 24 hr tablet Take 1 tablet (500 mg total) by mouth daily with breakfast.  30 tablet  0  . pantoprazole (PROTONIX) 40 MG tablet Take 40  mg by mouth daily as needed (indigestion).      . pravastatin (PRAVACHOL) 80 MG tablet Take 1 tablet (80 mg total) by mouth daily.  30 tablet  0  . propafenone (RYTHMOL SR) 425 MG 12 hr capsule Take 1 capsule (425 mg total) by mouth 2 (two) times daily.  60 capsule  4   No current facility-administered medications for this visit.    Allergies  Allergen Reactions  . Amiodarone Itching  . Crestor [Rosuvastatin]     myalgia  . Fish Allergy Swelling    Shell fish  . Iodine Itching    itching  . Lipitor [Atorvastatin]     myalgias  . Penicillins Hives and Itching  . Ace Inhibitors Cough    Review of Systems negative except from HPI and PMH  Physical Exam BP 139/89  Pulse 109  Ht 5\' 3"  (1.6 m)  Wt 187  lb 4 oz (84.936 kg)  BMI 33.18 kg/m2 Well developed and well nourished in no acute distress HENT normal E scleral and icterus clear Neck Supple JVP flat; carotids brisk and full Clear to ausculation Fasting irregularly irregular without murmurs Soft with active bowel sounds No clubbing cyanosis Trace Edema Alert and oriented, grossly normal motor and sensory function Skin Warm and Dry  Atrial fibrillation at 109 intervals-/10/37  Assessment and  Plan  Atrial fibrillation-persistent  Mild left ventricular dysfunction  Hypertension/diabetes  We will augment rate control by increasing her diltiazem from 120 mg--240/120. In the event that she is not having symptomatic lightheadedness we will add metoprolol tartrate 25 mg twice daily in 48 hours.  We will plan to admit her on 1/31 to initiate dofetilide. Her QTC syndrome in a range where she has been in sinus rhythm albeit a long today  Potassium 1/8 was 4.3. Magnesium 12/20 was 1.8

## 2013-02-13 ENCOUNTER — Telehealth: Payer: Self-pay

## 2013-02-13 NOTE — Telephone Encounter (Signed)
Have not received paperwork back from [facility] yet.

## 2013-02-13 NOTE — Telephone Encounter (Signed)
Nina Carpenter with [facility] called, received an order for sleep study, needs clinical notes. Please call.

## 2013-02-19 ENCOUNTER — Other Ambulatory Visit: Payer: Self-pay | Admitting: Family Medicine

## 2013-02-19 MED ORDER — PRAVASTATIN SODIUM 80 MG PO TABS: 80.0000 mg | ORAL_TABLET | Freq: Every day | ORAL | Status: DC

## 2013-02-20 ENCOUNTER — Other Ambulatory Visit: Payer: Medicare Other

## 2013-02-20 ENCOUNTER — Telehealth: Payer: Self-pay | Admitting: *Deleted

## 2013-02-20 DIAGNOSIS — I495 Sick sinus syndrome: Secondary | ICD-10-CM

## 2013-02-20 DIAGNOSIS — I4891 Unspecified atrial fibrillation: Secondary | ICD-10-CM | POA: Diagnosis not present

## 2013-02-20 NOTE — Telephone Encounter (Signed)
Informed her that per Andrez Grime, PA-C "Low risk stress test. SK pt. Please inform patient. Thanks. -BE". Patient verbalized understanding.

## 2013-02-21 ENCOUNTER — Telehealth: Payer: Self-pay | Admitting: *Deleted

## 2013-02-21 LAB — MAGNESIUM: MAGNESIUM: 1.8 mg/dL (ref 1.6–2.6)

## 2013-02-21 LAB — POTASSIUM: Potassium: 4.4 mmol/L (ref 3.5–5.2)

## 2013-02-21 NOTE — Telephone Encounter (Signed)
Nina Carpenter w/ [facility] called and is needing documentation for the sleep.

## 2013-02-21 NOTE — Telephone Encounter (Signed)
Left message w/ [facility] to call back and let us know the status of pt's paperwork.

## 2013-02-22 ENCOUNTER — Inpatient Hospital Stay (HOSPITAL_COMMUNITY)
Admission: AD | Admit: 2013-02-22 | Discharge: 2013-02-26 | DRG: 309 | Disposition: A | Discharge status: Home / Self Care | Payer: Medicare Other | Source: Ambulatory Visit | Attending: Cardiology | Admitting: Cardiology

## 2013-02-22 ENCOUNTER — Encounter (HOSPITAL_COMMUNITY): Payer: Self-pay | Admitting: Nurse Practitioner

## 2013-02-22 DIAGNOSIS — E785 Hyperlipidemia, unspecified: Secondary | ICD-10-CM | POA: Diagnosis present

## 2013-02-22 DIAGNOSIS — Z8673 Personal history of transient ischemic attack (TIA), and cerebral infarction without residual deficits: Secondary | ICD-10-CM | POA: Diagnosis not present

## 2013-02-22 DIAGNOSIS — Z950 Presence of cardiac pacemaker: Secondary | ICD-10-CM

## 2013-02-22 DIAGNOSIS — K219 Gastro-esophageal reflux disease without esophagitis: Secondary | ICD-10-CM | POA: Diagnosis present

## 2013-02-22 DIAGNOSIS — E669 Obesity, unspecified: Secondary | ICD-10-CM | POA: Diagnosis present

## 2013-02-22 DIAGNOSIS — Z7901 Long term (current) use of anticoagulants: Secondary | ICD-10-CM

## 2013-02-22 DIAGNOSIS — Z6832 Body mass index (BMI) 32.0-32.9, adult: Secondary | ICD-10-CM

## 2013-02-22 DIAGNOSIS — I10 Essential (primary) hypertension: Secondary | ICD-10-CM | POA: Diagnosis present

## 2013-02-22 DIAGNOSIS — E119 Type 2 diabetes mellitus without complications: Secondary | ICD-10-CM | POA: Diagnosis not present

## 2013-02-22 DIAGNOSIS — I480 Paroxysmal atrial fibrillation: Secondary | ICD-10-CM | POA: Diagnosis present

## 2013-02-22 DIAGNOSIS — I498 Other specified cardiac arrhythmias: Secondary | ICD-10-CM | POA: Diagnosis not present

## 2013-02-22 DIAGNOSIS — Z87891 Personal history of nicotine dependence: Secondary | ICD-10-CM

## 2013-02-22 DIAGNOSIS — I509 Heart failure, unspecified: Secondary | ICD-10-CM | POA: Diagnosis present

## 2013-02-22 DIAGNOSIS — I5042 Chronic combined systolic (congestive) and diastolic (congestive) heart failure: Secondary | ICD-10-CM | POA: Diagnosis present

## 2013-02-22 DIAGNOSIS — Z79899 Other long term (current) drug therapy: Secondary | ICD-10-CM

## 2013-02-22 DIAGNOSIS — I4891 Unspecified atrial fibrillation: Principal | ICD-10-CM

## 2013-02-22 DIAGNOSIS — E1159 Type 2 diabetes mellitus with other circulatory complications: Secondary | ICD-10-CM | POA: Diagnosis present

## 2013-02-22 DIAGNOSIS — J45909 Unspecified asthma, uncomplicated: Secondary | ICD-10-CM | POA: Diagnosis present

## 2013-02-22 DIAGNOSIS — I495 Sick sinus syndrome: Secondary | ICD-10-CM | POA: Diagnosis present

## 2013-02-22 HISTORY — DX: Chronic combined systolic (congestive) and diastolic (congestive) heart failure: I50.42

## 2013-02-22 LAB — BASIC METABOLIC PANEL
BUN: 21 mg/dL (ref 6–23)
CALCIUM: 9 mg/dL (ref 8.4–10.5)
CHLORIDE: 102 meq/L (ref 96–112)
CO2: 22 meq/L (ref 19–32)
CREATININE: 0.87 mg/dL (ref 0.50–1.10)
GFR calc Af Amer: 78 mL/min — ABNORMAL LOW (ref >90)
GFR calc non Af Amer: 67 mL/min — ABNORMAL LOW (ref >90)
GLUCOSE: 163 mg/dL — AB (ref 70–99)
Potassium: 4.3 mEq/L (ref 3.7–5.3)
Sodium: 138 mEq/L (ref 137–147)

## 2013-02-22 LAB — GLUCOSE, CAPILLARY
Glucose-Capillary: 120 mg/dL — ABNORMAL HIGH (ref 70–99)
Glucose-Capillary: 118 mg/dL — ABNORMAL HIGH (ref 70–99)

## 2013-02-22 LAB — MAGNESIUM: MAGNESIUM: 1.9 mg/dL (ref 1.5–2.5)

## 2013-02-22 MED ORDER — METOPROLOL TARTRATE 25 MG PO TABS
25.0000 mg | ORAL_TABLET | Freq: Two times a day (BID) | ORAL | Status: DC
  Administered 2013-02-22 – 2013-02-26 (×8): 25 mg via ORAL
  Filled 2013-02-22 (×9): qty 1

## 2013-02-22 MED ORDER — LOSARTAN POTASSIUM 50 MG PO TABS
100.0000 mg | ORAL_TABLET | Freq: Every evening | ORAL | Status: DC
  Administered 2013-02-22 – 2013-02-25 (×4): 100 mg via ORAL
  Filled 2013-02-22 (×5): qty 2

## 2013-02-22 MED ORDER — DOFETILIDE 500 MCG PO CAPS
500.0000 ug | ORAL_CAPSULE | Freq: Two times a day (BID) | ORAL | Status: DC
  Administered 2013-02-22 – 2013-02-23 (×2): 500 ug via ORAL
  Filled 2013-02-22 (×3): qty 1

## 2013-02-22 MED ORDER — SODIUM CHLORIDE 0.9 % IJ SOLN
3.0000 mL | Freq: Two times a day (BID) | INTRAMUSCULAR | Status: DC
  Administered 2013-02-22 – 2013-02-26 (×8): 3 mL via INTRAVENOUS

## 2013-02-22 MED ORDER — DILTIAZEM HCL ER 90 MG PO CP12
240.0000 mg | ORAL_CAPSULE | Freq: Every day | ORAL | Status: DC
  Filled 2013-02-22 (×4): qty 1

## 2013-02-22 MED ORDER — NON FORMULARY: 40.0000 mg | Freq: Every evening | Status: DC

## 2013-02-22 MED ORDER — DILTIAZEM HCL ER 60 MG PO CP12
120.0000 mg | ORAL_CAPSULE | Freq: Every day | ORAL | Status: DC
  Administered 2013-02-22: 120 mg via ORAL
  Filled 2013-02-22 (×3): qty 2

## 2013-02-22 MED ORDER — FAMOTIDINE 20 MG PO TABS
20.0000 mg | ORAL_TABLET | Freq: Every day | ORAL | Status: DC | PRN
  Administered 2013-02-23: 20 mg via ORAL
  Filled 2013-02-22 (×2): qty 1

## 2013-02-22 MED ORDER — PRAVASTATIN SODIUM 40 MG PO TABS
40.0000 mg | ORAL_TABLET | Freq: Every day | ORAL | Status: DC
  Administered 2013-02-23 – 2013-02-26 (×4): 40 mg via ORAL
  Filled 2013-02-22 (×5): qty 1

## 2013-02-22 MED ORDER — DILTIAZEM HCL ER 60 MG PO CP12: 120.0000 mg | ORAL_CAPSULE | Freq: Two times a day (BID) | ORAL | Status: DC

## 2013-02-22 MED ORDER — PANTOPRAZOLE SODIUM 40 MG PO TBEC: 40.0000 mg | DELAYED_RELEASE_TABLET | Freq: Every day | ORAL | Status: DC | PRN

## 2013-02-22 MED ORDER — APIXABAN 5 MG PO TABS
5.0000 mg | ORAL_TABLET | Freq: Two times a day (BID) | ORAL | Status: DC
  Administered 2013-02-22 – 2013-02-26 (×8): 5 mg via ORAL
  Filled 2013-02-22 (×9): qty 1

## 2013-02-22 MED ORDER — SODIUM CHLORIDE 0.9 % IV SOLN: 250.0000 mL | INTRAVENOUS | Status: DC | PRN

## 2013-02-22 MED ORDER — SODIUM CHLORIDE 0.9 % IJ SOLN: 3.0000 mL | INTRAMUSCULAR | Status: DC | PRN

## 2013-02-22 MED ORDER — INSULIN ASPART 100 UNIT/ML ~~LOC~~ SOLN
0.0000 [IU] | Freq: Three times a day (TID) | SUBCUTANEOUS | Status: DC
  Administered 2013-02-23: 2 [IU] via SUBCUTANEOUS
  Administered 2013-02-23 – 2013-02-24 (×2): 3 [IU] via SUBCUTANEOUS
  Administered 2013-02-24: 18:00:00 via SUBCUTANEOUS
  Administered 2013-02-24 – 2013-02-25 (×2): 3 [IU] via SUBCUTANEOUS
  Administered 2013-02-25: 2 [IU] via SUBCUTANEOUS
  Administered 2013-02-25: 3 [IU] via SUBCUTANEOUS
  Administered 2013-02-26: 2 [IU] via SUBCUTANEOUS
  Administered 2013-02-26: 3 [IU] via SUBCUTANEOUS

## 2013-02-22 NOTE — H&P (Signed)
Patient ID: Nina Carpenter MRN: [id number], DOB/AGE: 10-12-1945   Admit date: 02/22/2013  Primary Physician: [name], MD Primary Cardiologist: Olin Pia, MD   Pt. Profile:  68 y/o female with h/o PAF who presents for tikosyn load after having failed flecainide, amiodarone (intense itching), sotalol, and propafenone.  Problem List  Past Medical History  Diagnosis Date  . Allergy   . Asthma     a. Remotely.  . Type 2 diabetes mellitus   . GERD (gastroesophageal reflux disease)   . Hyperlipidemia   . Hypertension   . Kidney stones     a. Remotely.  Marland Kitchen Hx of rheumatic fever     a. As a child.  Marland Kitchen PAF (paroxysmal atrial fibrillation)     a. Dx 09/2012, chronicity unclear at that time. Initially refused Xarelto due to risk of bleeding. b. Sustained CVA 12/2012 - agreeable to taking Eliquis. Tachybrady syndrome during that admission s/p Medtronic PPM implantation;  c. Failed flecainide, sotalol, amiodarone (itching), and propafenone.  . Obesity   . CVA (cerebral vascular accident)     a. Thromboemoblic L MCA CVA 15/4008 with no residual deficit except mild occasional word finding. b. 6-76% RICA/LICA by dopplers 19/5/09.  . Tachy-brady syndrome     a. AF RVR with pauses >5sec requiring Medtronic PPM 12/2012.  . H/O medication noncompliance     a. Per PCP note 01/06/13: Previously refused anything greater than aspirin, refused treatment for DM, and often stopped hypertensive meds and lipid meds.  . Chronic combined systolic and diastolic CHF (congestive heart failure)     a. Felt related to AF. EF 45-50% by echo 12/2012.  Marland Kitchen Acid reflux disease     Past Surgical History  Procedure Laterality Date  . Cholecystectomy    . Tubal ligation      ectopic preganacy   . Destruction trigeminal nerve via neurolytic agent  NOv @013     Gamma Knife   . Pacemaker insertion  12-30-12    MDT dual chamber pacemaker implanted by Dr Caryl Comes for tachy-brady syndrome  . Insert / replace /  remove pacemaker      Allergies  Allergies  Allergen Reactions  . Amiodarone Itching  . Crestor [Rosuvastatin]     myalgia  . Fish Allergy Swelling    Shell fish  . Iodine Itching    itching  . Lipitor [Atorvastatin]     myalgias  . Penicillins Hives and Itching  . Ace Inhibitors Cough   HPI  68 y/o female with a h/o PAF dating back to 09/2012 at which time she had AF and refused anticoagulation.  She converted to sinus rhythm by the time she followed up in clinic in 10/2012 and was maintained on rate-controlling agents.  Unfortunately, in 12/2012, she presented to [facility] with confusion and speech deficit and was diagnosed with a left MCA stroke. She was also in atrial fibrillation with rapid ventricular response and noted to have greater than 5 second pauses. Electrophysiology was consulted and she underwent Medtronic permanent pacemaker placement for tachybradycardia syndrome. At that point, she was agreeable to initiate eliquis therapy.  Upon followup, she was having more atrial fibrillation and decision was made to initiate flecainide therapy. This was unsuccessful at keeping her out of atrial fibrillation and this was later switched to sotalol and later amiodarone. Amiodarone had been discontinued secondary to severe itching and she was placed on Rythmol therapy. She was readmitted on January 5 with recurrent A. fib and her Rythmol  dose was titrated to 425 mg twice a day. She followed up with Dr. Caryl Comes on January 20 and reported recurrent A. fib. Decision was made to bring her in for elective loading of Tikosyn therapy.  Rythmol was discontinued and she has been maintained on diltiazem and metoprolol since. She says that since her last clinic visit, she has had very little if any significant atrial fibrillation and has overall been feeling well. She has been compliant with her medications. She presents today for elective Tikosyn loading.  Home Medications  Prior to Admission medications     Medication Sig Start Date End Date Taking? Authorizing Provider  apixaban (ELIQUIS) 5 MG TABS tablet Take 1 tablet (5 mg total) by mouth 2 (two) times daily. 01/03/13   Cherene Altes, MD  diltiazem (DILACOR XR) 120 MG 24 hr capsule 120 mg. 2 tablets in the morning and 1 in the afternoon. 02/11/13   Deboraha Sprang, MD  famotidine (PEPCID) 20 MG tablet Take 20 mg by mouth as needed for indigestion.    Historical Provider, MD  losartan (COZAAR) 100 MG tablet Take 100 mg by mouth every evening.     Historical Provider, MD  metFORMIN (GLUCOPHAGE XR) 500 MG 24 hr tablet Take 1 tablet (500 mg total) by mouth daily with breakfast. 01/06/13   [name], MD  metoprolol tartrate (LOPRESSOR) 25 MG tablet 25 mg tablets twice a day if not dizzy after taking Diltiazem. 02/11/13   Deboraha Sprang, MD  pantoprazole (PROTONIX) 40 MG tablet Take 40 mg by mouth daily as needed (indigestion).    Historical Provider, MD  pravastatin (PRAVACHOL) 80 MG tablet Take 1 tablet (80 mg total) by mouth daily. 02/19/13   [name], MD    Family History  Family History  Problem Relation Age of Onset  . Stroke Mother   . Diabetes Mother     Social History  History   Social History  . Marital Status: Married    Spouse Name: N/A    Number of Children: 2  . Years of Education: N/A   Occupational History  . cosmetologist    Social History Main Topics  . Smoking status: Former Smoker -- 2.00 packs/day    Types: Cigarettes    Quit date: 01/24/1968  . Smokeless tobacco: Never Used  . Alcohol Use: 0.6 oz/week    1 Glasses of wine per week     Comment: rare use  . Drug Use: No  . Sexual Activity: Yes   Other Topics Concern  . Not on file   Social History Narrative   Lives in [place] with her husband.  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     Review of Systems General:  No chills, fever, night sweats or weight changes.  Cardiovascular:  No chest pain, dyspnea on exertion, edema,  orthopnea, palpitations, paroxysmal nocturnal dyspnea. Dermatological: No rash, lesions/masses Respiratory: No cough, dyspnea Urologic: No hematuria, dysuria Abdominal:   No nausea, vomiting, diarrhea, bright red blood per rectum, melena, or hematemesis Neurologic:  No visual changes, wkns, changes in mental status. All other systems reviewed and are otherwise negative except as noted above.  Physical Exam  Blood pressure 133/64, pulse 66, temperature 97.9 F (36.6 C), temperature source Oral, resp. rate 19, height 5\' 3"  (1.6 m), weight 185 lb 3 oz (84 kg), SpO2 100.00%.  General: Pleasant, NAD Psych: Normal affect. Neuro: Alert and oriented X 3. Moves all extremities spontaneously. HEENT: Normal  Neck: Supple without  bruits or JVD. Lungs:  Resp regular and unlabored, CTA. Heart: RRR no s3, s4, or murmurs. Abdomen: Soft, non-tender, non-distended, BS + x 4.  Extremities: No clubbing, cyanosis or edema. DP/PT/Radials 2+ and equal bilaterally.  Labs  Lab Results  Component Value Date   WBC 6.7 01/30/2013   HGB 13.7 01/30/2013   HCT 39.8 01/30/2013   MCV 90.5 01/30/2013   PLT 195 01/30/2013     Recent Labs Lab 02/22/13 1350  NA 138  K 4.3  CL 102  CO2 22  BUN 21  CREATININE 0.87  CALCIUM 9.0  GLUCOSE 163*   Mg 1.9  Lab Results  Component Value Date   CHOL 227* 12/28/2012   HDL 50 12/28/2012   LDLCALC 154* 12/28/2012   TRIG 114 12/28/2012   Radiology/Studies  None  ECG  Atrial paced rhythm without acute ST or T changes. QTC is 440 ms.  ASSESSMENT AND PLAN  1. Paroxysmal atrial fibrillation: Patient has failed flecainide, sotalol, and Rythmol, and did not tolerate amiodarone secondary to itching. She presents today for elective Tikosyn loading. QTC is within normal limits at 440 ms, potassium is 4.3, and magnesium is 1.9. Creatinine clearance is 83.1 mL per minute based on her actual weight, and thus we will plan to initiate Tikosyn at 500 mcg every 12 hours. She usually  takes her medications on a 9 and 9 schedule at home and therefore we'll schedule her Tikosyn to start at 9 PM this evening. She will require ECGs 2 hours after each of the first 6 doses. Provided that she maintains sinus rhythm and does not have any significant widening of her QTC, we would anticipate discharge on Tuesday, February 3. Continue eliquis.  2. Hypertension: Stable.  3. Type II but diabetes mellitus: Add sliding scale insulin. Hold metformin in case she were to require contrast for some reason.  4.  Hyperlipidemia: Continue statin therapy. Last LDL was 154 in early December. This should be followed up.  Signed, Murray Hodgkins, NP 02/22/2013, 3:34 PM Patient seen. Presently in NSR--atrially paced. Feels well.  No chest pain or dyspnea. No significant residual from prior stroke. Presents now for tikosyn loading. Will proceed as noted above. Agree with above assessment and plan.

## 2013-02-23 ENCOUNTER — Encounter (HOSPITAL_COMMUNITY): Payer: Self-pay

## 2013-02-23 DIAGNOSIS — I509 Heart failure, unspecified: Secondary | ICD-10-CM | POA: Diagnosis not present

## 2013-02-23 DIAGNOSIS — I5042 Chronic combined systolic (congestive) and diastolic (congestive) heart failure: Secondary | ICD-10-CM | POA: Diagnosis not present

## 2013-02-23 DIAGNOSIS — I4891 Unspecified atrial fibrillation: Secondary | ICD-10-CM | POA: Diagnosis not present

## 2013-02-23 DIAGNOSIS — E119 Type 2 diabetes mellitus without complications: Secondary | ICD-10-CM | POA: Diagnosis not present

## 2013-02-23 LAB — BASIC METABOLIC PANEL
BUN: 20 mg/dL (ref 6–23)
CO2: 23 meq/L (ref 19–32)
CREATININE: 0.83 mg/dL (ref 0.50–1.10)
Calcium: 9.3 mg/dL (ref 8.4–10.5)
Chloride: 104 mEq/L (ref 96–112)
GFR calc Af Amer: 83 mL/min — ABNORMAL LOW (ref >90)
GFR calc non Af Amer: 71 mL/min — ABNORMAL LOW (ref >90)
Glucose, Bld: 133 mg/dL — ABNORMAL HIGH (ref 70–99)
POTASSIUM: 4.1 meq/L (ref 3.7–5.3)
Sodium: 142 mEq/L (ref 137–147)

## 2013-02-23 LAB — GLUCOSE, CAPILLARY
GLUCOSE-CAPILLARY: 154 mg/dL — AB (ref 70–99)
GLUCOSE-CAPILLARY: 106 mg/dL — AB (ref 70–99)
Glucose-Capillary: 135 mg/dL — ABNORMAL HIGH (ref 70–99)
Glucose-Capillary: 124 mg/dL — ABNORMAL HIGH (ref 70–99)

## 2013-02-23 LAB — MAGNESIUM: Magnesium: 1.9 mg/dL (ref 1.5–2.5)

## 2013-02-23 MED ORDER — DILTIAZEM HCL ER COATED BEADS 120 MG PO CP24
120.0000 mg | ORAL_CAPSULE | Freq: Every day | ORAL | Status: DC
  Filled 2013-02-23: qty 1

## 2013-02-23 MED ORDER — DILTIAZEM HCL ER 60 MG PO CP12
120.0000 mg | ORAL_CAPSULE | Freq: Every day | ORAL | Status: DC
Start: 2013-02-23 — End: 2013-02-26
  Administered 2013-02-23 – 2013-02-25 (×3): 120 mg via ORAL
  Filled 2013-02-23 (×4): qty 2

## 2013-02-23 MED ORDER — DILTIAZEM HCL ER COATED BEADS 240 MG PO CP24
240.0000 mg | ORAL_CAPSULE | Freq: Every day | ORAL | Status: DC
  Filled 2013-02-23: qty 1

## 2013-02-23 MED ORDER — DILTIAZEM HCL ER 90 MG PO CP12
240.0000 mg | ORAL_CAPSULE | Freq: Every day | ORAL | Status: DC
  Administered 2013-02-23 – 2013-02-25 (×3): 240 mg via ORAL
  Filled 2013-02-23 (×5): qty 1

## 2013-02-23 MED ORDER — DILTIAZEM HCL ER COATED BEADS 240 MG PO CP24: 240.0000 mg | ORAL_CAPSULE | Freq: Every day | ORAL | Status: DC

## 2013-02-23 MED ORDER — DOFETILIDE 250 MCG PO CAPS
375.0000 ug | ORAL_CAPSULE | Freq: Two times a day (BID) | ORAL | Status: DC
  Administered 2013-02-23 – 2013-02-24 (×2): 375 ug via ORAL
  Filled 2013-02-23 (×3): qty 1

## 2013-02-23 NOTE — Progress Notes (Signed)
ECG from 12:01 PM reviewed (performed 3 hrs after tikosyn 500 mcg - 2nd dose overall).  QT now out to 500 msec, QTc 500 msec (RR 1000 - a-paced @ 60).  Based on prior conversation with Dr. Caryl Comes, will reduce Tikosyn dose to 375 mcg q 12h.  Discussed with patient who understands and is agreeable.  Murray Hodgkins, NP 02/23/2013 2:37 PM

## 2013-02-23 NOTE — Progress Notes (Signed)
Patient Name: Nina Carpenter Date of Encounter: 02/23/2013   Principal Problem:   PAF (paroxysmal atrial fibrillation) Active Problems:   Type 2 diabetes mellitus with vascular disease   HYPERLIPIDEMIA   HYPERTENSION   Tachy-brady syndrome   Cardiac pacemaker in situ   Chronic anticoagulation- Eliquis   SUBJECTIVE  In sinus.  Feels well.  QTc out to 480 from 440 after first dose of tikosyn 500 mcg last night.  CURRENT MEDS . apixaban  5 mg Oral BID  . diltiazem  120 mg Oral QHS  . diltiazem  240 mg Oral Daily  . dofetilide  500 mcg Oral Q12H  . insulin aspart  0-15 Units Subcutaneous TID WC  . losartan  100 mg Oral QPM  . metoprolol tartrate  25 mg Oral BID  . pravastatin  40 mg Oral q1800  . sodium chloride  3 mL Intravenous Q12H    OBJECTIVE  Filed Vitals:   02/22/13 1237 02/22/13 1430 02/22/13 2002 02/23/13 0334  BP: 133/64 128/63 147/82 126/56  Pulse: 66 63 63 61  Temp: 97.9 F (36.6 C)  97.6 F (36.4 C) 98.2 F (36.8 C)  TempSrc: Oral Oral Oral Oral  Resp: 19  18 18   Height: 5\' 3"  (1.6 m)     Weight: 185 lb 3 oz (84 kg)     SpO2: 100% 99% 100% 99%    Intake/Output Summary (Last 24 hours) at 02/23/13 0921 Last data filed at 02/22/13 1700  Gross per 24 hour  Intake    120 ml  Output      0 ml  Net    120 ml   Filed Weights   02/22/13 1237  Weight: 185 lb 3 oz (84 kg)    PHYSICAL EXAM  General: Pleasant, NAD. Neuro: Alert and oriented X 3. Moves all extremities spontaneously. Psych: Normal affect. HEENT:  Normal  Neck: Supple without bruits or JVD. Lungs:  Resp regular and unlabored, CTA. Heart: RRR no s3, s4, or murmurs. Abdomen: Soft, non-tender, non-distended, BS + x 4.  Extremities: No clubbing, cyanosis or edema. DP/PT/Radials 2+ and equal bilaterally.  Accessory Clinical Findings  Basic Metabolic Panel  Recent Labs  02/22/13 1350 02/23/13 0332  NA 138 142  K 4.3 4.1  CL 102 104  CO2 22 23  GLUCOSE 163* 133*  BUN 21 20    CREATININE 0.87 0.83  CALCIUM 9.0 9.3  MG 1.9 1.9   TELE  A paced  ECG  A paced, 60, QTc 480. (3 hrs after first dose of tikosyn).  Radiology/Studies  Dg Chest 2 View  01/27/2013   CLINICAL DATA:  Atrial fibrillation, hypertension, diabetes, pacemaker, asthma, history stroke, diastolic CHF  EXAM: CHEST  2 VIEW  COMPARISON:  01/11/2013  FINDINGS: Left subclavian transvenous pacemaker leads project at right atrium and right ventricle.  Enlargement of cardiac silhouette with slight pulmonary vascular congestion.  Mediastinal contours normal.  Minimal bronchitic changes without infiltrate, pleural effusion or pneumothorax.  Scattered endplate spur formation thoracic spine.  IMPRESSION: Enlargement of cardiac silhouette with pulmonary vascular congestion.  Minimal bronchitic changes.  No acute abnormalities.   Electronically Signed   By: Lavonia Dana M.D.   On: 01/27/2013 17:39   ASSESSMENT AND PLAN  1.  PAF:  Maintaining sinus after first dose of tikosyn last night.  QTc widened to 480 from 440 msec after first dose.  I spoke with Dr. Caryl Comes on the phone this morning.  As this is less than a  15% increase, we will continue the 500 mcg dose this AM and f/u ECG 2 hrs afterward.  If QTc continues to widen, the next step would be to reduce the dose to 375 mcg q 12h.  Cont eliquis.  2.  HTN:  Stable.  Signed, Murray Hodgkins NP Patient has done well overnight.  She remains in an atrial paced rhythm.  She has had no chest pain or shortness of breath.  As above, QTc interval has widened after the first dose ofTikosyn. Plan is as noted above as per Dr. Caryl Comes.  Repeat EKG 2 hours after this morning's dose is pending.

## 2013-02-23 NOTE — Progress Notes (Signed)
02/23/13 2145  Gastrointestinal  Bowel Sounds Assessment Hyperactive  GI Symptoms Diarrhea;Heartburn (Pt states related to eating iceburg lettuce since gallbl out)  Heartburn relieved by Acid reducer

## 2013-02-24 DIAGNOSIS — E119 Type 2 diabetes mellitus without complications: Secondary | ICD-10-CM | POA: Diagnosis not present

## 2013-02-24 DIAGNOSIS — I509 Heart failure, unspecified: Secondary | ICD-10-CM | POA: Diagnosis not present

## 2013-02-24 DIAGNOSIS — I10 Essential (primary) hypertension: Secondary | ICD-10-CM | POA: Diagnosis not present

## 2013-02-24 DIAGNOSIS — I4891 Unspecified atrial fibrillation: Secondary | ICD-10-CM | POA: Diagnosis not present

## 2013-02-24 DIAGNOSIS — I5042 Chronic combined systolic (congestive) and diastolic (congestive) heart failure: Secondary | ICD-10-CM | POA: Diagnosis not present

## 2013-02-24 LAB — GLUCOSE, CAPILLARY
GLUCOSE-CAPILLARY: 151 mg/dL — AB (ref 70–99)
Glucose-Capillary: 161 mg/dL — ABNORMAL HIGH (ref 70–99)
Glucose-Capillary: 150 mg/dL — ABNORMAL HIGH (ref 70–99)
Glucose-Capillary: 148 mg/dL — ABNORMAL HIGH (ref 70–99)

## 2013-02-24 LAB — BASIC METABOLIC PANEL
BUN: 18 mg/dL (ref 6–23)
CALCIUM: 9.1 mg/dL (ref 8.4–10.5)
CO2: 21 meq/L (ref 19–32)
Chloride: 101 mEq/L (ref 96–112)
Creatinine, Ser: 0.78 mg/dL (ref 0.50–1.10)
GFR calc Af Amer: >90 mL/min (ref >90)
GFR calc non Af Amer: 85 mL/min — ABNORMAL LOW (ref >90)
Glucose, Bld: 127 mg/dL — ABNORMAL HIGH (ref 70–99)
Potassium: 4 mEq/L (ref 3.7–5.3)
SODIUM: 138 meq/L (ref 137–147)

## 2013-02-24 LAB — MAGNESIUM: Magnesium: 2 mg/dL (ref 1.5–2.5)

## 2013-02-24 MED ORDER — DOFETILIDE 250 MCG PO CAPS
250.0000 ug | ORAL_CAPSULE | Freq: Two times a day (BID) | ORAL | Status: DC
  Administered 2013-02-25 – 2013-02-26 (×3): 250 ug via ORAL
  Filled 2013-02-24 (×5): qty 1

## 2013-02-24 NOTE — Progress Notes (Signed)
    Patient: Nina Carpenter Date of Encounter: 02/24/2013, 7:09 AM Admit date: 02/22/2013     Subjective  Nina Carpenter reports loose stools last night which she states is due to eating iceberg lettuce. She reports loose stools after eating lettuce is usual for her since she had cholecystectomy. She has no other complaints.   Objective  Physical Exam: Vitals: BP 112/71  Pulse 61  Temp(Src) 98.2 F (36.8 C) (Oral)  Resp 18  Ht 5\' 3"  (1.6 m)  Wt 185 lb 3 oz (84 kg)  BMI 32.81 kg/m2  SpO2 96% General: Well developed, well appearing 68 year old female in no acute distress. Neck: Supple. JVD not elevated. Lungs: Clear bilaterally to auscultation without wheezes, rales, or rhonchi. Breathing is unlabored. Heart: RRR S1 S2 without murmurs, rubs, or gallops.  Abdomen: Soft, non-distended. Extremities: No clubbing or cyanosis. No edema.  Distal pedal pulses are 2+ and equal bilaterally. Neuro: Alert and oriented X 3. Moves all extremities spontaneously. No focal deficits.  Intake/Output:  Intake/Output Summary (Last 24 hours) at 02/24/13 0709 Last data filed at 02/23/13 1700  Gross per 24 hour  Intake    240 ml  Output      0 ml  Net    240 ml    Inpatient Medications:  . apixaban  5 mg Oral BID  . diltiazem  120 mg Oral QHS  . diltiazem  240 mg Oral Daily  . dofetilide  375 mcg Oral Q12H  . insulin aspart  0-15 Units Subcutaneous TID WC  . losartan  100 mg Oral QPM  . metoprolol tartrate  25 mg Oral BID  . pravastatin  40 mg Oral q1800  . sodium chloride  3 mL Intravenous Q12H    Labs:  Recent Labs  02/22/13 1350 02/23/13 0332  NA 138 142  K 4.3 4.1  CL 102 104  CO2 22 23  GLUCOSE 163* 133*  BUN 21 20  CREATININE 0.87 0.83  CALCIUM 9.0 9.3  MG 1.9 1.9    Telemetry: A paced V sensed   Assessment and Plan  1. Paroxysmal atrial fibrillation     - maintaining SR     - QTc 480 by ECG early this AM     - labs pending 2. HTN     - well  controlled  Signed, EDMISTEN, BROOKE PA-C  Agree with above The other issue is diarrhea. This can be seen with dofetilide, but we can hope it was related to the salad she ate yesterday Anticipate home in am

## 2013-02-24 NOTE — Progress Notes (Signed)
QTc > 500. Results called [employer] notified. Will hold tikosyn until further notice.Cindee Salt

## 2013-02-24 NOTE — Progress Notes (Signed)
Reviewed post Tikosyn ECG - manual QTc 512 msec. Spoke with Dr. Caryl Comes. Will decrease dose to 250 mcg twice daily. Continue to monitor ECGs.

## 2013-02-25 ENCOUNTER — Ambulatory Visit: Payer: Medicare Other | Admitting: *Deleted

## 2013-02-25 DIAGNOSIS — E119 Type 2 diabetes mellitus without complications: Secondary | ICD-10-CM | POA: Diagnosis not present

## 2013-02-25 DIAGNOSIS — I5042 Chronic combined systolic (congestive) and diastolic (congestive) heart failure: Secondary | ICD-10-CM | POA: Diagnosis not present

## 2013-02-25 DIAGNOSIS — I10 Essential (primary) hypertension: Secondary | ICD-10-CM | POA: Diagnosis not present

## 2013-02-25 DIAGNOSIS — I4891 Unspecified atrial fibrillation: Secondary | ICD-10-CM | POA: Diagnosis not present

## 2013-02-25 DIAGNOSIS — I509 Heart failure, unspecified: Secondary | ICD-10-CM | POA: Diagnosis not present

## 2013-02-25 LAB — BASIC METABOLIC PANEL
BUN: 18 mg/dL (ref 6–23)
CO2: 22 mEq/L (ref 19–32)
Calcium: 8.8 mg/dL (ref 8.4–10.5)
Chloride: 103 mEq/L (ref 96–112)
Creatinine, Ser: 0.8 mg/dL (ref 0.50–1.10)
GFR calc non Af Amer: 75 mL/min — ABNORMAL LOW (ref >90)
GFR, EST AFRICAN AMERICAN: 86 mL/min — AB (ref >90)
Glucose, Bld: 133 mg/dL — ABNORMAL HIGH (ref 70–99)
Potassium: 4.5 mEq/L (ref 3.7–5.3)
SODIUM: 139 meq/L (ref 137–147)

## 2013-02-25 LAB — GLUCOSE, CAPILLARY
GLUCOSE-CAPILLARY: 176 mg/dL — AB (ref 70–99)
GLUCOSE-CAPILLARY: 148 mg/dL — AB (ref 70–99)
Glucose-Capillary: 157 mg/dL — ABNORMAL HIGH (ref 70–99)
Glucose-Capillary: 142 mg/dL — ABNORMAL HIGH (ref 70–99)

## 2013-02-25 LAB — MAGNESIUM: MAGNESIUM: 2 mg/dL (ref 1.5–2.5)

## 2013-02-25 NOTE — Progress Notes (Signed)
    Patient: ANEIRA CAVITT Date of Encounter: 02/25/2013, 7:05 AM Admit date: 02/22/2013     Subjective  Ms. Soyars denies complaints. She is maintaining SR.    Objective  Physical Exam: Vitals: BP 113/54  Pulse 65  Temp(Src) 98.3 F (36.8 C) (Oral)  Resp 20  Ht 5\' 3"  (1.6 m)  Wt 185 lb 3 oz (84 kg)  BMI 32.81 kg/m2  SpO2 98% General: Well developed, well appearing 68 year old female in no acute distress. Neck: Supple. JVD not elevated. Lungs: Clear bilaterally to auscultation without wheezes, rales, or rhonchi. Breathing is unlabored. Heart: RRR S1 S2 without murmurs, rubs, or gallops.  Abdomen: Soft, non-distended. Extremities: No clubbing or cyanosis. No edema.  Distal pedal pulses are 2+ and equal bilaterally. Neuro: Alert and oriented X 3. Moves all extremities spontaneously. No focal deficits.  Intake/Output:  Intake/Output Summary (Last 24 hours) at 02/25/13 0705 Last data filed at 02/24/13 2100  Gross per 24 hour  Intake    600 ml  Output      0 ml  Net    600 ml    Inpatient Medications:  . apixaban  5 mg Oral BID  . diltiazem  120 mg Oral QHS  . diltiazem  240 mg Oral Daily  . dofetilide  250 mcg Oral Q12H  . insulin aspart  0-15 Units Subcutaneous TID WC  . losartan  100 mg Oral QPM  . metoprolol tartrate  25 mg Oral BID  . pravastatin  40 mg Oral q1800  . sodium chloride  3 mL Intravenous Q12H    Labs:  Recent Labs  02/24/13 0520 02/25/13 0409  NA 138 139  K 4.0 4.5  CL 101 103  CO2 21 22  GLUCOSE 127* 133*  BUN 18 18  CREATININE 0.78 0.80  CALCIUM 9.1 8.8  MG 2.0 2.0    Telemetry: A paced V sensed   Assessment and Plan  1. Paroxysmal atrial fibrillation - maintaining SR - serum Cr, potassium and magnesium normal - QTc 512 after yesterday AM dose; dose decreased to 250 mcg every 12 hours; unfortunately there was confusion about timing of PM dose and she did not receive dose last night; will check ECG this AM and if QT stable, will  give 250 mcg this AM  2. HTN     - well controlled  Signed, Shiah Berhow PA-C

## 2013-02-25 NOTE — Care Management Note (Signed)
    Page 1 of 1   02/26/2013     2:53:25 PM   CARE MANAGEMENT NOTE 02/26/2013  Patient:  Carpenter, Nina   Account Number:  [id number]  Date Initiated:  02/25/2013  Documentation initiated by:  Mellanie Bejarano  Subjective/Objective Assessment:   PT ADM ON 02/22/13 FOR TIKOSYN LOADING.   PTA, PT INDEPENDENT, LIVES AT HOME WITH SPOUSE.     Action/Plan:   MET WITH PT TO DISCUSS OBTAINING TIKOSYN POST DC.   Anticipated DC Date:  02/26/2013   Anticipated DC Plan:  [place]  CM consult  Medication Assistance      Choice offered to / List presented to:             Status of service:  Completed, signed off Medicare Important Message given?   (If response is "NO", the following Medicare IM given date fields will be blank) Date Medicare IM given:   Date Additional Medicare IM given:    Discharge Disposition:  HOME/SELF CARE  Per UR Regulation:  Reviewed for med. necessity/level of care/duration of stay  If discussed at [place] of Stay Meetings, dates discussed:    Comments:   02/26/13 Choua Ikner,RN,BSN [phone] ONE WEEK'S SUPPLY OF [facility] IN MAIN [place].  02/25/13 Stewart Sasaki,RN,BSN [phone] MET WITH PT TO DISCUSS OBTAINING TIKOSYN AT DC:  CHECKED WITH PT'S PHARMACY, [facility] ON [address], AND THEY CAN DISPENSE TIKOSYN.  THEY ALSO HAVE CURRENT DOSE, 250MCG IN STOCK.  PT'S COPAY WILL BE $70/MONTH, PER INSURANCE VERIFICATION. PT WILL NEED 2 RX FOR TIKOSYN UPON DC:  ONE WITH REFILLS, AND ONE FOR A WEEK'S SUPPLY TO BE FILLED BY [facility].

## 2013-02-26 ENCOUNTER — Telehealth: Payer: Self-pay | Admitting: *Deleted

## 2013-02-26 DIAGNOSIS — I509 Heart failure, unspecified: Secondary | ICD-10-CM | POA: Diagnosis not present

## 2013-02-26 DIAGNOSIS — E119 Type 2 diabetes mellitus without complications: Secondary | ICD-10-CM | POA: Diagnosis not present

## 2013-02-26 DIAGNOSIS — I4891 Unspecified atrial fibrillation: Secondary | ICD-10-CM | POA: Diagnosis not present

## 2013-02-26 DIAGNOSIS — I5042 Chronic combined systolic (congestive) and diastolic (congestive) heart failure: Secondary | ICD-10-CM | POA: Diagnosis not present

## 2013-02-26 LAB — BASIC METABOLIC PANEL
BUN: 20 mg/dL (ref 6–23)
CO2: 22 mEq/L (ref 19–32)
Calcium: 9.8 mg/dL (ref 8.4–10.5)
Chloride: 101 mEq/L (ref 96–112)
Creatinine, Ser: 0.86 mg/dL (ref 0.50–1.10)
GFR calc Af Amer: 79 mL/min — ABNORMAL LOW (ref >90)
GFR, EST NON AFRICAN AMERICAN: 68 mL/min — AB (ref >90)
GLUCOSE: 187 mg/dL — AB (ref 70–99)
Potassium: 4.5 mEq/L (ref 3.7–5.3)
SODIUM: 140 meq/L (ref 137–147)

## 2013-02-26 LAB — GLUCOSE, CAPILLARY: Glucose-Capillary: 152 mg/dL — ABNORMAL HIGH (ref 70–99)

## 2013-02-26 LAB — MAGNESIUM: Magnesium: 2.2 mg/dL (ref 1.5–2.5)

## 2013-02-26 MED ORDER — PANTOPRAZOLE SODIUM 40 MG PO TBEC: 40.0000 mg | DELAYED_RELEASE_TABLET | Freq: Every day | ORAL | Status: DC

## 2013-02-26 MED ORDER — DILTIAZEM HCL ER 60 MG PO CP12
240.0000 mg | ORAL_CAPSULE | Freq: Every day | ORAL | Status: DC
  Administered 2013-02-26: 240 mg via ORAL
  Filled 2013-02-26 (×2): qty 4

## 2013-02-26 MED ORDER — DOFETILIDE 250 MCG PO CAPS: 250.0000 ug | ORAL_CAPSULE | Freq: Two times a day (BID) | ORAL | Status: DC

## 2013-02-26 NOTE — Progress Notes (Signed)
    Patient: Nina Carpenter Date of Encounter: 02/26/2013, 8:07 AM Admit date: 02/22/2013     Subjective  Ms. Valladares denies complaints. She had brief AFib episode yesterday afternoon.    Objective  Physical Exam: Vitals: BP 123/60  Pulse 65  Temp(Src) 98.2 F (36.8 C) (Oral)  Resp 18  Ht 5\' 3"  (1.6 m)  Wt 185 lb 3 oz (84 kg)  BMI 32.81 kg/m2  SpO2 96% General: Well developed, well appearing 68 year old female in no acute distress. Neck: Supple. JVD not elevated. Lungs: Clear bilaterally to auscultation without wheezes, rales, or rhonchi. Breathing is unlabored. Heart: RRR S1 S2 without murmurs, rubs, or gallops.  Abdomen: Soft, non-distended. Extremities: No clubbing or cyanosis. No edema.  Distal pedal pulses are 2+ and equal bilaterally. Neuro: Alert and oriented X 3. Moves all extremities spontaneously. No focal deficits.  Intake/Output:  Intake/Output Summary (Last 24 hours) at 02/26/13 0807 Last data filed at 02/25/13 2114  Gross per 24 hour  Intake   1083 ml  Output      0 ml  Net   1083 ml    Inpatient Medications:  . apixaban  5 mg Oral BID  . diltiazem  120 mg Oral QHS  . diltiazem  240 mg Oral Daily  . dofetilide  250 mcg Oral Q12H  . insulin aspart  0-15 Units Subcutaneous TID WC  . losartan  100 mg Oral QPM  . metoprolol tartrate  25 mg Oral BID  . pravastatin  40 mg Oral q1800  . sodium chloride  3 mL Intravenous Q12H    Labs:  Recent Labs  02/24/13 0520 02/25/13 0409  NA 138 139  K 4.0 4.5  CL 101 103  CO2 21 22  GLUCOSE 127* 133*  BUN 18 18  CREATININE 0.78 0.80  CALCIUM 9.1 8.8  MG 2.0 2.0    Telemetry: A paced V sensed; brief AFib episode around 1pm   Assessment and Plan  1. Paroxysmal atrial fibrillation - maintaining SR - serum Cr, potassium and magnesium pending this AM - QTc 484  - continue Tikosyn 250 mcg every 12 hours - if labs stable this AM, plan to DC today with follow-up in office in one week for ECG and labs (BMET,  Mg) 2. HTN     - well controlled  Signed, Ferman Basilio PA-C

## 2013-02-26 NOTE — Progress Notes (Signed)
Asked by EP team due to proximity of still being inhouse to write rx for TIkosyn 250 mcg q12 - 7 day RX to obtain from our pharmacy. Dayna Dunn PA-C

## 2013-02-26 NOTE — Progress Notes (Signed)
Missed dose lasaat pm  Not sure why  Will keep overnight explainwed to patient,  She and husband exceedingly gracious about it

## 2013-02-26 NOTE — Progress Notes (Signed)
Pt discharge home with husband. No s/s of distress novoiced complaints. Discharge instructions/ prescriptions/and 1 week supply of tikyson given to patient from pharmacy. Pt VU. Encourage pt to call with any needs. Pt VU.

## 2013-02-26 NOTE — Progress Notes (Signed)
QTc aabout 48o Ok for discharge tikosyn clinic followup next week SK 3 weeks Confirm f/u also with Greggory Brandy

## 2013-02-26 NOTE — Telephone Encounter (Signed)
Information sent as requested.

## 2013-02-26 NOTE — Telephone Encounter (Signed)
Mardene Celeste with [facility] need the H&P when Dr. Caryl Comes ordered her sleep study on 01/30/13.  Please fax to [phone]

## 2013-02-26 NOTE — Discharge Summary (Signed)
ELECTROPHYSIOLOGY DISCHARGE SUMMARY    Patient ID: Nina Carpenter,  MRN: [id number], DOB/AGE: 1945-08-25 68 y.o.  Admit date: 02/22/2013 Discharge date: 02/26/2013  Primary Care Physician: [name], MD Primary Cardiologist: Jolyn Nap, MD  Primary Discharge Diagnosis:  1. Paroxysmal atrial fibrillation s/p Tikosyn loading  Secondary Discharge Diagnoses:  1. Tachy-brady syndrome s/p PPM implant 2. Prior CVA Dec 2014 3. Mild LV dysfunction, EF 45-50% 4. HTN 5. Dyslipidemia 6. DM 7. Asthma 8. GERD  Procedures This Admission:  None   History and Hospital Course:  Nina Carpenter is a 68 year old woman with PAF who has failed multiple AADs including flecainide, sotalol, amiodarone and propafenone and presented on 02/22/2013 for Tikosyn loading. On admission, she was in SR - A paced V sensed. Baseline QTc 440 msec. Potassium 4.3. Magnesium 1.9. Serum Cr stable with estimated clearance of 83 mL/min. She was started on Tikosyn 500 mcg every 12 hours per protocol. She developed QT prolongation with both 500 mcg and 375 mcg dose. Her dose was decreased to 250 mcg every 12 hours and her QTc remained stable at this dose - 480 msec at discharge. Serum Cr, potassium and magnesium remained stable. She was seen, examined and deemed stable for discharge home today by Dr. Jolyn Nap. She will have close follow-up in our office with BMET, Mg and 12-lead ECG in one week. She will see Dr. Caryl Comes in 3 weeks.    Discharge Vitals: Blood pressure 138/78, pulse 60, temperature 98.5 F (36.9 C), temperature source Oral, resp. rate 18, height 5\' 3"  (1.6 m), weight 185 lb 3 oz (84 kg), SpO2 97.00%.   Labs:   Recent Labs Lab 02/26/13 0848  NA 140  K 4.5  CL 101  CO2 22  BUN 20  CREATININE 0.86  CALCIUM 9.8  GLUCOSE 187*  Mg 2.2  Disposition:  The patient is being discharged in stable condition.  Follow-up:     Follow-up Information   Follow up with [facility] On  03/04/2013. (At 2:00 PM with Elberta Leatherwood, PharmD for Tikosyn follow-up, 12-lead ECG and labs)    Specialty:  Cardiology   Contact information:   [address], [place] [address] [phone]      Follow up with Thompson Grayer, MD On 04/03/2013. (At 8:30 AM for consultation regarding AFib ablation)    Specialty:  Cardiology   Contact information:   [place] [address] [phone]       Follow up with Virl Axe, MD On 03/25/2013. (At 9:00 AM)    Specialty:  Cardiology   Contact information:   [place]   [place] [id number] [phone]      Discharge Medications:    Medication List    STOP taking these medications       famotidine 20 MG tablet  Commonly known as:  PEPCID      TAKE these medications       apixaban 5 MG Tabs tablet  Commonly known as:  ELIQUIS  Take 1 tablet (5 mg total) by mouth 2 (two) times daily.     diltiazem 120 MG 12 hr capsule  Commonly known as:  CARDIZEM SR  - Take 120-240 mg by mouth 2 (two) times daily. Take 2 capsules in the morning (10am)  - Take 1 capusle in the afternoon (10pm)     dofetilide 250 MCG capsule  Commonly known as:  Phyllis Ginger  Take 1 capsule (250 mcg total) by mouth every 12 (twelve) hours.     losartan 100 MG tablet  Commonly known as:  COZAAR  Take 100 mg by mouth every evening.     metFORMIN 500 MG 24 hr tablet  Commonly known as:  GLUCOPHAGE XR  Take 1 tablet (500 mg total) by mouth daily with breakfast.     metoprolol tartrate 25 MG tablet  Commonly known as:  LOPRESSOR  Take 25 mg by mouth 2 (two) times daily.     pantoprazole 40 MG tablet  Commonly known as:  PROTONIX  Take 1 tablet (40 mg total) by mouth daily.     pravastatin 80 MG tablet  Commonly known as:  PRAVACHOL  Take 1 tablet (80 mg total) by mouth daily.       Duration of Discharge Encounter: Greater than 30 minutes including physician time.  Signed, Tomaz Janis,  Jerene Pitch, PA-C 02/26/2013, 1:50 PM

## 2013-02-26 NOTE — Discharge Instructions (Signed)
DO NOT start any new medications (over-the-counter or prescription) without notifying Dr. Caryl Comes and/or Elberta Leatherwood, PharmD.

## 2013-02-27 LAB — GLUCOSE, CAPILLARY: GLUCOSE-CAPILLARY: 135 mg/dL — AB (ref 70–99)

## 2013-03-04 ENCOUNTER — Ambulatory Visit (INDEPENDENT_AMBULATORY_CARE_PROVIDER_SITE_OTHER): Payer: Medicare Other | Admitting: Pharmacist

## 2013-03-04 DIAGNOSIS — I4891 Unspecified atrial fibrillation: Secondary | ICD-10-CM | POA: Diagnosis not present

## 2013-03-04 DIAGNOSIS — I6350 Cerebral infarction due to unspecified occlusion or stenosis of unspecified cerebral artery: Secondary | ICD-10-CM

## 2013-03-04 LAB — BASIC METABOLIC PANEL
BUN: 17 mg/dL (ref 6–23)
CALCIUM: 9.4 mg/dL (ref 8.4–10.5)
CO2: 25 mEq/L (ref 19–32)
Chloride: 106 mEq/L (ref 96–112)
Creatinine, Ser: 1 mg/dL (ref 0.4–1.2)
GFR: 61.5 mL/min (ref >60.00)
GLUCOSE: 171 mg/dL — AB (ref 70–99)
Potassium: 4.1 mEq/L (ref 3.5–5.1)
SODIUM: 138 meq/L (ref 135–145)

## 2013-03-04 LAB — MAGNESIUM: MAGNESIUM: 1.8 mg/dL (ref 1.5–2.5)

## 2013-03-05 ENCOUNTER — Encounter: Payer: Medicare Other | Admitting: Pharmacist

## 2013-03-06 NOTE — Progress Notes (Signed)
HPI  Nina Carpenter is a 68 yo F of Dr. Caryl Comes who was recently admitted to the hospital for Tikosyn intiation.  She has a history of PAF and failed multiple antiarrhythmics in the past including flecainide, sotalol, amiodarone, and propafenone. She was started on Tikosyn 562mcg BID but required 2 dosing changes due to prolongated QTC.  She was discharged on 258mcg BID and QTc was 475 upon discharge.    Since discharge, pt states she is feeling better.  She is back at work.  Her only complaint with afib in the past was some nausea and she has had no episodes since discharge.  Only difference is 2 cases of diarrhea (once in hospital and once after discharge) but unsure this is related to Tikosyn given pt also started metformin 3 weeks ago.  She has not missed any doses and has received her prescription from her local pharmacy with no problems.   EKG today reviewed by Dr. Caryl Comes.  A paced with rate of 91 bpm.  QTc= 479 msec.   Current Outpatient Prescriptions  Medication Sig Dispense Refill  . apixaban (ELIQUIS) 5 MG TABS tablet Take 1 tablet (5 mg total) by mouth 2 (two) times daily.  60 tablet  0  . diltiazem (CARDIZEM SR) 120 MG 12 hr capsule Take 120-240 mg by mouth 2 (two) times daily. Take 2 capsules in the morning (10am) Take 1 capusle in the afternoon (10pm)      . dofetilide (TIKOSYN) 250 MCG capsule Take 1 capsule (250 mcg total) by mouth every 12 (twelve) hours.  60 capsule  4  . losartan (COZAAR) 100 MG tablet Take 100 mg by mouth every evening.       . metFORMIN (GLUCOPHAGE XR) 500 MG 24 hr tablet Take 1 tablet (500 mg total) by mouth daily with breakfast.  30 tablet  0  . metoprolol tartrate (LOPRESSOR) 25 MG tablet Take 25 mg by mouth 2 (two) times daily.      . pantoprazole (PROTONIX) 40 MG tablet Take 1 tablet (40 mg total) by mouth daily.  30 tablet  4  . pravastatin (PRAVACHOL) 80 MG tablet Take 1 tablet (80 mg total) by mouth daily.  30 tablet  5   No current facility-administered  medications for this visit.

## 2013-03-06 NOTE — Assessment & Plan Note (Signed)
Pt doing well with Tikosyn 1 week post discharge.  QTc stable. Mg and K still at goal.  Will continue current therapy and have pt follow up as planned with Dr. Caryl Comes in 3 weeks.

## 2013-03-12 DIAGNOSIS — I6350 Cerebral infarction due to unspecified occlusion or stenosis of unspecified cerebral artery: Secondary | ICD-10-CM | POA: Diagnosis not present

## 2013-03-12 DIAGNOSIS — I10 Essential (primary) hypertension: Secondary | ICD-10-CM | POA: Diagnosis not present

## 2013-03-12 DIAGNOSIS — R9431 Abnormal electrocardiogram [ECG] [EKG]: Secondary | ICD-10-CM | POA: Diagnosis not present

## 2013-03-12 DIAGNOSIS — I4891 Unspecified atrial fibrillation: Secondary | ICD-10-CM | POA: Diagnosis not present

## 2013-03-17 ENCOUNTER — Ambulatory Visit: Payer: Self-pay | Admitting: Nurse Practitioner

## 2013-03-19 ENCOUNTER — Encounter: Payer: Medicare Other | Admitting: Internal Medicine

## 2013-03-25 ENCOUNTER — Encounter: Payer: Self-pay | Admitting: Internal Medicine

## 2013-03-25 ENCOUNTER — Ambulatory Visit (INDEPENDENT_AMBULATORY_CARE_PROVIDER_SITE_OTHER): Payer: Medicare Other | Admitting: Internal Medicine

## 2013-03-25 VITALS — BP 130/90 | HR 129 | Ht 63.0 in | Wt 189.5 lb

## 2013-03-25 DIAGNOSIS — I4891 Unspecified atrial fibrillation: Secondary | ICD-10-CM

## 2013-03-25 DIAGNOSIS — I480 Paroxysmal atrial fibrillation: Secondary | ICD-10-CM

## 2013-03-25 DIAGNOSIS — I6350 Cerebral infarction due to unspecified occlusion or stenosis of unspecified cerebral artery: Secondary | ICD-10-CM

## 2013-03-25 LAB — MDC_IDC_ENUM_SESS_TYPE_INCLINIC
Battery Remaining Longevity: 113 mo
Battery Voltage: 3.03 V
Brady Statistic AS VS Percent: 47.61 %
Brady Statistic RA Percent Paced: 47.91 %
Lead Channel Impedance Value: 361 Ohm
Lead Channel Impedance Value: 475 Ohm
Lead Channel Pacing Threshold Amplitude: 0.5 V
Lead Channel Sensing Intrinsic Amplitude: 5.375 mV
Lead Channel Sensing Intrinsic Amplitude: 5.375 mV
Lead Channel Setting Pacing Amplitude: 2.5 V
Lead Channel Setting Pacing Pulse Width: 0.4 ms
Lead Channel Setting Sensing Sensitivity: 2 mV
MDC IDC MSMT LEADCHNL RA IMPEDANCE VALUE: 418 Ohm
MDC IDC MSMT LEADCHNL RV IMPEDANCE VALUE: 551 Ohm
MDC IDC MSMT LEADCHNL RV PACING THRESHOLD PULSEWIDTH: 0.4 ms
MDC IDC SESS DTM: 20150303095309
MDC IDC SET LEADCHNL RA PACING AMPLITUDE: 2 V
MDC IDC SET ZONE DETECTION INTERVAL: 400 ms
MDC IDC STAT BRADY AP VP PERCENT: 1.9 %
MDC IDC STAT BRADY AP VS PERCENT: 46.01 %
MDC IDC STAT BRADY AS VP PERCENT: 4.48 %
MDC IDC STAT BRADY RV PERCENT PACED: 6.38 %
Zone Setting Detection Interval: 400 ms

## 2013-03-25 MED ORDER — FUROSEMIDE 20 MG PO TABS: 20.0000 mg | ORAL_TABLET | ORAL | Status: DC

## 2013-03-25 NOTE — Patient Instructions (Signed)
Your physician has recommended you make the following change in your medication:  Start Lasix 20 mg daily for 3 days then every other day    Your physician recommends that you return for lab work in:  BMP in 8 days   Your physician recommends that you schedule a follow-up appointment in:  Mid May   Please see your PCP for sleep study

## 2013-03-25 NOTE — Progress Notes (Signed)
Patient Care Team: [name], MD as PCP - General   HPI  Nina Carpenter is a 68 y.o. female Seen in [place] for difficult to control PAF   She underwent pacing for symptomatic bradycardia  She has elected to go to [facility] for [facility] scheduled in  April  She is tolerating her AFib better with rate conrol she has edema with minimal dyspnea   Past Medical History  Diagnosis Date  . Allergy   . Asthma     a. Remotely.  . Type 2 diabetes mellitus   . GERD (gastroesophageal reflux disease)   . Hyperlipidemia   . Hypertension   . Kidney stones     a. Remotely.  Marland Kitchen Hx of rheumatic fever     a. As a child.  Marland Kitchen PAF (paroxysmal atrial fibrillation)     a. Dx 09/2012, chronicity unclear at that time. Initially refused Xarelto due to risk of bleeding. b. Sustained CVA 12/2012 - agreeable to taking Eliquis. Tachybrady syndrome during that admission s/p Medtronic PPM implantation;  c. Failed flecainide, sotalol, amiodarone (itching), and propafenone.  . Obesity   . CVA (cerebral vascular accident)     a. Thromboemoblic L MCA CVA 27/2536 with no residual deficit except mild occasional word finding. b. 6-44% RICA/LICA by dopplers 03/26/72.  . Tachy-brady syndrome     a. AF RVR with pauses >5sec requiring Medtronic PPM 12/2012.  . H/O medication noncompliance     a. Per PCP note 01/06/13: Previously refused anything greater than aspirin, refused treatment for DM, and often stopped hypertensive meds and lipid meds.  . Chronic combined systolic and diastolic CHF (congestive heart failure)     a. Felt related to AF. EF 45-50% by echo 12/2012.  Marland Kitchen Acid reflux disease     Past Surgical History  Procedure Laterality Date  . Cholecystectomy    . Tubal ligation      ectopic preganacy   . Destruction trigeminal nerve via neurolytic agent  NOv @013     Gamma Knife   . Pacemaker insertion  12-30-12    MDT dual chamber pacemaker implanted by Dr Caryl Comes for tachy-brady syndrome  . Insert /  replace / remove pacemaker      Current Outpatient Prescriptions  Medication Sig Dispense Refill  . apixaban (ELIQUIS) 5 MG TABS tablet Take 1 tablet (5 mg total) by mouth 2 (two) times daily.  60 tablet  0  . diltiazem (CARDIZEM SR) 120 MG 12 hr capsule Take 120-240 mg by mouth 2 (two) times daily. Take 2 capsules in the morning (10am) Take 1 capusle in the afternoon (10pm)      . dofetilide (TIKOSYN) 250 MCG capsule Take 1 capsule (250 mcg total) by mouth every 12 (twelve) hours.  60 capsule  4  . losartan (COZAAR) 100 MG tablet Take 100 mg by mouth every evening.       . metFORMIN (GLUCOPHAGE XR) 500 MG 24 hr tablet Take 1 tablet (500 mg total) by mouth daily with breakfast.  30 tablet  0  . metoprolol tartrate (LOPRESSOR) 25 MG tablet Take 25 mg by mouth 2 (two) times daily.      . pantoprazole (PROTONIX) 40 MG tablet Take 40 mg by mouth as needed.      . pravastatin (PRAVACHOL) 80 MG tablet Take 1 tablet (80 mg total) by mouth daily.  30 tablet  5   No current facility-administered medications for this visit.    Allergies  Allergen  Reactions  . Amiodarone Itching  . Crestor [Rosuvastatin]     myalgia  . Fish Allergy Swelling    Shell fish  . Iodine Itching    itching  . Lipitor [Atorvastatin]     myalgias  . Penicillins Hives and Itching  . Ace Inhibitors Cough    Review of Systems negative except from HPI and PMH  Physical Exam BP 130/90  Pulse 129  Ht 5\' 3"  (1.6 m)  Wt 189 lb 8 oz (85.957 kg)  BMI 33.58 kg/m2 Well developed and nourished in no acute distress HENT normal Neck supple with JVP-flat Carotids brisk and full without bruits Clear Irregularly irregular rate and rhythm withrapid ventricular response, no murmurs or gallops Abd-soft with active BS without hepatomegaly No Clubbing cyanosis 2+ edema Skin-warm and dry A & Oriented  Grossly normal sensory and motor function   Assessment and  Plan  Atrial fib:: paroxsymal with less on dofetilide  For PVI  at [facility] next month  Syncope  Stable post pacing  Edema ? Drug related or AF/HFpEF  Will begin diuretic and check BMET next week  Pacer  The patient's device was interrogated.  The information was reviewed. No changes were made in the programming.    ]

## 2013-03-31 ENCOUNTER — Ambulatory Visit (INDEPENDENT_AMBULATORY_CARE_PROVIDER_SITE_OTHER): Payer: Medicare Other

## 2013-03-31 DIAGNOSIS — I4891 Unspecified atrial fibrillation: Secondary | ICD-10-CM

## 2013-03-31 DIAGNOSIS — I480 Paroxysmal atrial fibrillation: Secondary | ICD-10-CM

## 2013-04-01 LAB — BASIC METABOLIC PANEL
BUN / CREAT RATIO: 21 (ref 11–26)
BUN: 19 mg/dL (ref 8–27)
CHLORIDE: 102 mmol/L (ref 97–108)
CO2: 22 mmol/L (ref 18–29)
Calcium: 9.5 mg/dL (ref 8.7–10.3)
Creatinine, Ser: 0.91 mg/dL (ref 0.57–1.00)
GFR calc non Af Amer: 65 mL/min/{1.73_m2} (ref >59)
GFR, EST AFRICAN AMERICAN: 76 mL/min/{1.73_m2} (ref >59)
Glucose: 181 mg/dL — ABNORMAL HIGH (ref 65–99)
POTASSIUM: 4.9 mmol/L (ref 3.5–5.2)
SODIUM: 141 mmol/L (ref 134–144)

## 2013-04-03 ENCOUNTER — Ambulatory Visit: Payer: Medicare Other | Admitting: Nurse Practitioner

## 2013-04-03 ENCOUNTER — Institutional Professional Consult (permissible substitution): Payer: Medicare Other | Admitting: Internal Medicine

## 2013-04-07 ENCOUNTER — Ambulatory Visit (INDEPENDENT_AMBULATORY_CARE_PROVIDER_SITE_OTHER): Payer: Medicare Other | Admitting: Family Medicine

## 2013-04-07 ENCOUNTER — Encounter: Payer: Self-pay | Admitting: Family Medicine

## 2013-04-07 VITALS — BP 130/70 | HR 88 | Temp 98.3°F | Ht 63.0 in | Wt 191.0 lb

## 2013-04-07 DIAGNOSIS — I6350 Cerebral infarction due to unspecified occlusion or stenosis of unspecified cerebral artery: Secondary | ICD-10-CM | POA: Diagnosis not present

## 2013-04-07 DIAGNOSIS — E1159 Type 2 diabetes mellitus with other circulatory complications: Secondary | ICD-10-CM | POA: Diagnosis not present

## 2013-04-07 DIAGNOSIS — I798 Other disorders of arteries, arterioles and capillaries in diseases classified elsewhere: Secondary | ICD-10-CM

## 2013-04-07 DIAGNOSIS — I4891 Unspecified atrial fibrillation: Secondary | ICD-10-CM | POA: Diagnosis not present

## 2013-04-07 DIAGNOSIS — I639 Cerebral infarction, unspecified: Secondary | ICD-10-CM

## 2013-04-07 LAB — HEMOGLOBIN A1C: Hgb A1c MFr Bld: 7.7 % — ABNORMAL HIGH (ref 4.6–6.5)

## 2013-04-07 NOTE — Progress Notes (Signed)
Pre visit review using our clinic review tool, if applicable. No additional management support is needed unless otherwise documented below in the visit note. 

## 2013-04-07 NOTE — Progress Notes (Signed)
Date:  04/07/2013   Name:  Nina Carpenter   DOB:  [birth year]   MRN:  [id number]  Primary Physician:  [name], MD   Chief Complaint: Follow-up   Subjective:   History of Present Illness:  Nina Carpenter is a 68 y.o. very pleasant female patient who presents with the following:  AF: the patient has had multiple hospitalizations, breakthrough A. Fib with RVR, and has been on multiple antiarrhythmics. She is fairly frustrated with this, and she is going to have an atrial fibrillation ablation done at [facility]. Going to see Dr. Coy Saunas - [facility] for ablation in 3 weeks.  Status post CVA. Some residual difficulty with rare miss pronounce or do not say things. Word stumbling. No weakness her balance difficulty. No altered sensation. She otherwise is recovered well from a standpoint of her recent stroke.  Hair on [address]   Diabetes Mellitus: Tolerating Medications: yes Compliance with diet: fair Exercise: minimal / intermittent Avg blood sugars at home: occ Foot problems: none Hypoglycemia: none No nausea, vomitting, blurred vision, polyuria.  Lab Results  Component Value Date   HGBA1C 7.7* 04/07/2013   HGBA1C 7.9* 12/29/2012   HGBA1C 7.8* 12/28/2012   Lab Results  Component Value Date   MICROALBUR 2.6* 11/07/2011   LDLCALC 154* 12/28/2012   CREATININE 0.91 03/31/2013    Wt Readings from Last 3 Encounters:  04/07/13 191 lb (86.637 kg)  03/25/13 189 lb 8 oz (85.957 kg)  02/22/13 185 lb 3 oz (84 kg)    Body mass index is 33.84 kg/(m^2).    Past Medical History, Surgical History, Social History, Family History, Problem List, Medications, and Allergies have been reviewed and updated if relevant.  Review of Systems:  GEN: No acute illnesses, no fevers, chills. GI: No n/v/d, eating normally Pulm: No SOB Interactive and getting along well at home.  Otherwise, ROS is as per the HPI.  Objective:   Physical Examination: BP 130/70  Pulse 88  Temp(Src) 98.3  F (36.8 C) (Oral)  Ht 5\' 3"  (1.6 m)  Wt 191 lb (86.637 kg)  BMI 33.84 kg/m2   GEN: WDWN, NAD, Non-toxic, A & O x 3 HEENT: Atraumatic, Normocephalic. Neck supple. No masses, No LAD. Ears and Nose: No external deformity. CV: RRR, No M/G/R. No JVD. No thrill. No extra heart sounds. PULM: CTA B, no wheezes, crackles, rhonchi. No retractions. No resp. distress. No accessory muscle use. EXTR: No c/c/e NEURO Normal gait.  PSYCH: Normally interactive. Conversant. Not depressed or anxious appearing.  Calm demeanor.   Laboratory and Imaging Data: Results for orders placed in visit on 04/07/13  HEMOGLOBIN A1C      Result Value Ref Range   Hemoglobin A1C 7.7 (*) 4.6 - 6.5 %     Assessment & Plan:   Type 2 diabetes mellitus with vascular disease - Plan: Hemoglobin A1c  Atrial fibrillation  CVA (cerebral vascular accident)  Uncontrolled DM with a1c of 7.7. I have asked her to increase Metformin to 2 tablets a day.  Recovering well from CVA.  She asked me if she can go off multiple other medications after her ablation, including her metformin. I still think that there is a bit of a disconnect and she does not really want to be taking medications for management of her chronic illnesses.  F/u 3 mo  Orders Placed This Encounter  Procedures  . Hemoglobin A1c   Patient's Medications  New Prescriptions   No medications on file  Previous  Medications   APIXABAN (ELIQUIS) 5 MG TABS TABLET    Take 1 tablet (5 mg total) by mouth 2 (two) times daily.   DILTIAZEM (CARDIZEM SR) 120 MG 12 HR CAPSULE    Take 120-240 mg by mouth 2 (two) times daily. Take 2 capsules in the morning (10am) Take 1 capusle in the afternoon (10pm)   DOFETILIDE (TIKOSYN) 250 MCG CAPSULE    Take 1 capsule (250 mcg total) by mouth every 12 (twelve) hours.   FUROSEMIDE (LASIX) 20 MG TABLET    Take 1 tablet (20 mg total) by mouth every other day.   LOSARTAN (COZAAR) 100 MG TABLET    Take 100 mg by mouth every evening.     METFORMIN (GLUCOPHAGE XR) 500 MG 24 HR TABLET    Take 1 tablet (500 mg total) by mouth daily with breakfast.   METOPROLOL TARTRATE (LOPRESSOR) 25 MG TABLET    Take 25 mg by mouth 2 (two) times daily.   PANTOPRAZOLE (PROTONIX) 40 MG TABLET    Take 40 mg by mouth as needed.   PRAVASTATIN (PRAVACHOL) 80 MG TABLET    Take 1 tablet (80 mg total) by mouth daily.  Modified Medications   No medications on file  Discontinued Medications   No medications on file   There are no Patient Instructions on file for this visit.  Signed,  Nina Deed. Finnlee Silvernail, MD, [place] at [facility] [place] [address] Phone: [phone] Fax: [phone]

## 2013-04-08 ENCOUNTER — Encounter: Payer: Medicare Other | Admitting: Internal Medicine

## 2013-04-10 ENCOUNTER — Ambulatory Visit: Payer: Medicare Other | Admitting: Nurse Practitioner

## 2013-04-10 ENCOUNTER — Telehealth: Payer: Self-pay

## 2013-04-10 NOTE — Telephone Encounter (Signed)
Relevant patient education assigned to patient using Emmi. ° °

## 2013-04-15 ENCOUNTER — Encounter: Payer: Self-pay | Admitting: Gastroenterology

## 2013-04-21 ENCOUNTER — Other Ambulatory Visit: Payer: Self-pay | Admitting: Family Medicine

## 2013-05-01 DIAGNOSIS — Z45018 Encounter for adjustment and management of other part of cardiac pacemaker: Secondary | ICD-10-CM | POA: Diagnosis not present

## 2013-05-01 DIAGNOSIS — R5381 Other malaise: Secondary | ICD-10-CM | POA: Diagnosis not present

## 2013-05-01 DIAGNOSIS — R911 Solitary pulmonary nodule: Secondary | ICD-10-CM | POA: Diagnosis not present

## 2013-05-01 DIAGNOSIS — R6889 Other general symptoms and signs: Secondary | ICD-10-CM | POA: Diagnosis not present

## 2013-05-01 DIAGNOSIS — I4891 Unspecified atrial fibrillation: Secondary | ICD-10-CM | POA: Diagnosis not present

## 2013-05-01 DIAGNOSIS — I517 Cardiomegaly: Secondary | ICD-10-CM | POA: Diagnosis not present

## 2013-05-01 DIAGNOSIS — Z950 Presence of cardiac pacemaker: Secondary | ICD-10-CM | POA: Diagnosis not present

## 2013-05-01 DIAGNOSIS — Z79899 Other long term (current) drug therapy: Secondary | ICD-10-CM | POA: Diagnosis not present

## 2013-05-02 DIAGNOSIS — I4891 Unspecified atrial fibrillation: Secondary | ICD-10-CM | POA: Diagnosis not present

## 2013-05-02 DIAGNOSIS — I10 Essential (primary) hypertension: Secondary | ICD-10-CM | POA: Diagnosis not present

## 2013-05-02 DIAGNOSIS — E119 Type 2 diabetes mellitus without complications: Secondary | ICD-10-CM | POA: Diagnosis not present

## 2013-05-02 DIAGNOSIS — Z950 Presence of cardiac pacemaker: Secondary | ICD-10-CM | POA: Diagnosis not present

## 2013-05-02 DIAGNOSIS — K219 Gastro-esophageal reflux disease without esophagitis: Secondary | ICD-10-CM | POA: Diagnosis not present

## 2013-05-03 DIAGNOSIS — I10 Essential (primary) hypertension: Secondary | ICD-10-CM | POA: Diagnosis not present

## 2013-05-03 DIAGNOSIS — E119 Type 2 diabetes mellitus without complications: Secondary | ICD-10-CM | POA: Diagnosis not present

## 2013-05-03 DIAGNOSIS — Z950 Presence of cardiac pacemaker: Secondary | ICD-10-CM | POA: Diagnosis not present

## 2013-05-03 DIAGNOSIS — I4891 Unspecified atrial fibrillation: Secondary | ICD-10-CM | POA: Diagnosis not present

## 2013-05-03 DIAGNOSIS — K219 Gastro-esophageal reflux disease without esophagitis: Secondary | ICD-10-CM | POA: Diagnosis not present

## 2013-05-09 ENCOUNTER — Emergency Department: Payer: Self-pay | Admitting: Emergency Medicine

## 2013-05-09 DIAGNOSIS — E119 Type 2 diabetes mellitus without complications: Secondary | ICD-10-CM | POA: Diagnosis not present

## 2013-05-09 DIAGNOSIS — IMO0002 Reserved for concepts with insufficient information to code with codable children: Secondary | ICD-10-CM | POA: Diagnosis not present

## 2013-05-09 DIAGNOSIS — I10 Essential (primary) hypertension: Secondary | ICD-10-CM | POA: Diagnosis not present

## 2013-05-09 LAB — BASIC METABOLIC PANEL
Anion Gap: 7 (ref 7–16)
BUN: 14 mg/dL (ref 7–18)
CALCIUM: 9.5 mg/dL (ref 8.5–10.1)
CHLORIDE: 103 mmol/L (ref 98–107)
CREATININE: 0.97 mg/dL (ref 0.60–1.30)
Co2: 29 mmol/L (ref 21–32)
EGFR (African American): >60
EGFR (Non-African Amer.): >60
GLUCOSE: 141 mg/dL — AB (ref 65–99)
Osmolality: 280 (ref 275–301)
Potassium: 3.9 mmol/L (ref 3.5–5.1)
Sodium: 139 mmol/L (ref 136–145)

## 2013-05-09 LAB — CBC
HCT: 40 % (ref 35.0–47.0)
HGB: 13.3 g/dL (ref 12.0–16.0)
MCH: 30.4 pg (ref 26.0–34.0)
MCHC: 33.2 g/dL (ref 32.0–36.0)
MCV: 92 fL (ref 80–100)
Platelet: 244 10*3/uL (ref 150–440)
RBC: 4.36 10*6/uL (ref 3.80–5.20)
RDW: 12.8 % (ref 11.5–14.5)
WBC: 7.2 10*3/uL (ref 3.6–11.0)

## 2013-05-09 LAB — APTT: Activated PTT: 32.9 secs (ref 23.6–35.9)

## 2013-05-09 LAB — PROTIME-INR
INR: 1.1
Prothrombin Time: 13.8 secs (ref 11.5–14.7)

## 2013-05-17 ENCOUNTER — Encounter (HOSPITAL_COMMUNITY): Payer: Self-pay | Admitting: Emergency Medicine

## 2013-05-17 ENCOUNTER — Emergency Department (HOSPITAL_COMMUNITY)
Admission: EM | Admit: 2013-05-17 | Discharge: 2013-05-18 | Disposition: A | Discharge status: Home / Self Care | Payer: Medicare Other | Attending: Emergency Medicine | Admitting: Emergency Medicine

## 2013-05-17 DIAGNOSIS — E119 Type 2 diabetes mellitus without complications: Secondary | ICD-10-CM | POA: Diagnosis not present

## 2013-05-17 DIAGNOSIS — Z91199 Patient's noncompliance with other medical treatment and regimen due to unspecified reason: Secondary | ICD-10-CM | POA: Diagnosis not present

## 2013-05-17 DIAGNOSIS — Z87891 Personal history of nicotine dependence: Secondary | ICD-10-CM | POA: Insufficient documentation

## 2013-05-17 DIAGNOSIS — Z9889 Other specified postprocedural states: Secondary | ICD-10-CM | POA: Insufficient documentation

## 2013-05-17 DIAGNOSIS — J45909 Unspecified asthma, uncomplicated: Secondary | ICD-10-CM | POA: Insufficient documentation

## 2013-05-17 DIAGNOSIS — N3090 Cystitis, unspecified without hematuria: Secondary | ICD-10-CM | POA: Diagnosis not present

## 2013-05-17 DIAGNOSIS — Z950 Presence of cardiac pacemaker: Secondary | ICD-10-CM | POA: Diagnosis not present

## 2013-05-17 DIAGNOSIS — Z880 Allergy status to penicillin: Secondary | ICD-10-CM | POA: Diagnosis not present

## 2013-05-17 DIAGNOSIS — K219 Gastro-esophageal reflux disease without esophagitis: Secondary | ICD-10-CM | POA: Insufficient documentation

## 2013-05-17 DIAGNOSIS — Z8619 Personal history of other infectious and parasitic diseases: Secondary | ICD-10-CM | POA: Insufficient documentation

## 2013-05-17 DIAGNOSIS — E669 Obesity, unspecified: Secondary | ICD-10-CM | POA: Diagnosis not present

## 2013-05-17 DIAGNOSIS — Z7902 Long term (current) use of antithrombotics/antiplatelets: Secondary | ICD-10-CM | POA: Diagnosis not present

## 2013-05-17 DIAGNOSIS — Z79899 Other long term (current) drug therapy: Secondary | ICD-10-CM | POA: Diagnosis not present

## 2013-05-17 DIAGNOSIS — Z8673 Personal history of transient ischemic attack (TIA), and cerebral infarction without residual deficits: Secondary | ICD-10-CM | POA: Diagnosis not present

## 2013-05-17 DIAGNOSIS — Z9119 Patient's noncompliance with other medical treatment and regimen: Secondary | ICD-10-CM | POA: Insufficient documentation

## 2013-05-17 DIAGNOSIS — Z87442 Personal history of urinary calculi: Secondary | ICD-10-CM | POA: Insufficient documentation

## 2013-05-17 DIAGNOSIS — I4891 Unspecified atrial fibrillation: Secondary | ICD-10-CM | POA: Insufficient documentation

## 2013-05-17 DIAGNOSIS — I5042 Chronic combined systolic (congestive) and diastolic (congestive) heart failure: Secondary | ICD-10-CM | POA: Diagnosis not present

## 2013-05-17 DIAGNOSIS — E785 Hyperlipidemia, unspecified: Secondary | ICD-10-CM | POA: Diagnosis not present

## 2013-05-17 DIAGNOSIS — I10 Essential (primary) hypertension: Secondary | ICD-10-CM | POA: Insufficient documentation

## 2013-05-17 DIAGNOSIS — N3091 Cystitis, unspecified with hematuria: Secondary | ICD-10-CM

## 2013-05-17 LAB — URINALYSIS, ROUTINE W REFLEX MICROSCOPIC
Bilirubin Urine: NEGATIVE
Glucose, UA: NEGATIVE mg/dL
Ketones, ur: NEGATIVE mg/dL
Nitrite: NEGATIVE
Protein, ur: NEGATIVE mg/dL
Specific Gravity, Urine: 1.005 (ref 1.005–1.030)
Urobilinogen, UA: 0.2 mg/dL (ref 0.0–1.0)
pH: 7 (ref 5.0–8.0)

## 2013-05-17 LAB — URINE MICROSCOPIC-ADD ON

## 2013-05-17 NOTE — ED Notes (Signed)
The pt had an ablation 2 weeks ago.  Tonight just pta she  wpied bright red blood after she voided.  She is having only urinary pressure

## 2013-05-18 MED ORDER — CEPHALEXIN 250 MG PO CAPS
500.0000 mg | ORAL_CAPSULE | Freq: Once | ORAL | Status: AC
  Administered 2013-05-18: 500 mg via ORAL
  Filled 2013-05-18: qty 2

## 2013-05-18 MED ORDER — CEPHALEXIN 500 MG PO CAPS: 500.0000 mg | ORAL_CAPSULE | Freq: Four times a day (QID) | ORAL | Status: DC

## 2013-05-18 NOTE — ED Notes (Signed)
Pt denies pain.  Pt states that there is no longer blood when she wipes.

## 2013-05-18 NOTE — ED Provider Notes (Signed)
Medical screening examination/treatment/procedure(s) were conducted as a shared visit with non-physician practitioner(s) and myself.  I personally evaluated the patient during the encounter.   EKG Interpretation None     68 year old female with hematuria. Mild pressure-like sensation in her lower back yesterday which is resolved. Suspect hemorrhagic cystitis. She is on blood thinners, but she describes what sounds like minimal bleeding. Her abdominal exam is benign. She is clinically stable. Afebrile. Plan course of antibiotics. Return precautions discussed. Outpatient followup as needed otherwise.  Virgel Manifold, MD 05/18/13 [phone]

## 2013-05-18 NOTE — ED Provider Notes (Signed)
Medical screening examination/treatment/procedure(s) were conducted as a shared visit with non-physician practitioner(s) and myself.  I personally evaluated the patient during the encounter.   EKG Interpretation None     See other note.  Virgel Manifold, MD 05/18/13 [phone]

## 2013-05-18 NOTE — ED Provider Notes (Signed)
CSN: [id number]     Arrival date & time 05/17/13  2256 History   First MD Initiated Contact with Patient 05/17/13 2345     Chief Complaint  Patient presents with  . Hematuria     (Consider location/radiation/quality/duration/timing/severity/associated sxs/prior Treatment) HPI  Patient is a diabetic and presents to the ED with complaints of hematuria that started this evening with urinary frequency. She urinated and wiped then had some light pink color on the tissue paper. She had a cardiac ablation done to repair afib approx 2 weeks ago and a foley catheter was placed. She does not have a hx of frequent UTIs she reports otherwise she has been feeling well. No weakness, nausea, vomiting, diarrhea. She had a moment of abdominal pain and pelvic pressure but it quickly resolved and has not reoccurred. She reports that she has been admitted to the hospital frequently and lets me know she is not staying today. She is lively and spry. At baseline per husband.   She is not on any blood thinners and has no had anymore symptoms since the procedure was completed. She takes Tikosyn  Past Medical History  Diagnosis Date  . Allergy   . Asthma     a. Remotely.  . Type 2 diabetes mellitus   . GERD (gastroesophageal reflux disease)   . Hyperlipidemia   . Hypertension   . Kidney stones     a. Remotely.  Marland Kitchen Hx of rheumatic fever     a. As a child.  Marland Kitchen PAF (paroxysmal atrial fibrillation)     a. Dx 09/2012, chronicity unclear at that time. Initially refused Xarelto due to risk of bleeding. b. Sustained CVA 12/2012 - agreeable to taking Eliquis. Tachybrady syndrome during that admission s/p Medtronic PPM implantation;  c. Failed flecainide, sotalol, amiodarone (itching), and propafenone.  . Obesity   . CVA (cerebral vascular accident)     a. Thromboemoblic L MCA CVA 71/6967 with no residual deficit except mild occasional word finding. b. 8-93% RICA/LICA by dopplers 81/0/17.  . Tachy-brady syndrome     a.  AF RVR with pauses >5sec requiring Medtronic PPM 12/2012.  . H/O medication noncompliance     a. Per PCP note 01/06/13: Previously refused anything greater than aspirin, refused treatment for DM, and often stopped hypertensive meds and lipid meds.  . Chronic combined systolic and diastolic CHF (congestive heart failure)     a. Felt related to AF. EF 45-50% by echo 12/2012.  Marland Kitchen Acid reflux disease    Past Surgical History  Procedure Laterality Date  . Cholecystectomy    . Tubal ligation      ectopic preganacy   . Destruction trigeminal nerve via neurolytic agent  NOv @013     Gamma Knife   . Pacemaker insertion  12-30-12    MDT dual chamber pacemaker implanted by Dr Caryl Comes for tachy-brady syndrome  . Insert / replace / remove pacemaker     Family History  Problem Relation Age of Onset  . Stroke Mother   . Diabetes Mother    History  Substance Use Topics  . Smoking status: Former Smoker -- 2.00 packs/day    Types: Cigarettes    Quit date: 01/24/1968  . Smokeless tobacco: Never Used  . Alcohol Use: 0.6 oz/week    1 Glasses of wine per week     Comment: rare use   OB History   Grav Para Term Preterm Abortions TAB SAB Ect Mult Living  Review of Systems   Review of Systems  Gen: no weight loss, fevers, chills, night sweats  Eyes: no discharge or drainage, no occular pain or visual changes  Nose: no epistaxis or rhinorrhea  Mouth: no dental pain, no sore throat  Neck: no neck pain  Lungs:No wheezing, coughing or hemoptysis CV: no chest pain, palpitations, dependent edema or orthopnea  Abd: no abdominal pain, nausea, vomiting, diarrhea GU: no dysuria   + gross hematuria  MSK:  No muscle weakness or pain Neuro: no headache, no focal neurologic deficits  Skin: no rash or wounds Psyche: no complaints    Allergies  Amiodarone; Crestor; Fish allergy; Iodine; Lipitor; Penicillins; and Ace inhibitors  Home Medications   Prior to Admission medications    Medication Sig Start Date End Date Taking? Authorizing Provider  apixaban (ELIQUIS) 5 MG TABS tablet Take 1 tablet (5 mg total) by mouth 2 (two) times daily. 01/03/13   Cherene Altes, MD  diltiazem (CARDIZEM SR) 120 MG 12 hr capsule Take 120-240 mg by mouth 2 (two) times daily. Take 2 capsules in the morning (10am) Take 1 capusle in the afternoon (10pm)    Historical Provider, MD  dofetilide (TIKOSYN) 250 MCG capsule Take 1 capsule (250 mcg total) by mouth every 12 (twelve) hours. 02/26/13   Brooke O Edmisten, PA-C  furosemide (LASIX) 20 MG tablet Take 1 tablet (20 mg total) by mouth every other day. 03/25/13   Deboraha Sprang, MD  losartan (COZAAR) 100 MG tablet take 1 tablet by mouth once daily    [name], MD  metFORMIN (GLUCOPHAGE XR) 500 MG 24 hr tablet Take 1 tablet (500 mg total) by mouth daily with breakfast. 01/06/13   [name], MD  metoprolol tartrate (LOPRESSOR) 25 MG tablet Take 25 mg by mouth 2 (two) times daily.    Historical Provider, MD  pantoprazole (PROTONIX) 40 MG tablet Take 40 mg by mouth as needed. 02/26/13   Brooke O Edmisten, PA-C  pravastatin (PRAVACHOL) 80 MG tablet Take 1 tablet (80 mg total) by mouth daily. 02/19/13   Spencer [name], MD   BP 161/88  Pulse 80  Temp(Src) 97.6 F (36.4 C)  Resp 20  Ht 5\' 3"  (1.6 m)  Wt 186 lb (84.369 kg)  BMI 32.96 kg/m2  SpO2 97% Physical Exam  Nursing note and vitals reviewed. Constitutional: She appears well-developed and well-nourished. No distress.  Pt looks healthy and happy  HENT:  Head: Normocephalic and atraumatic.  Eyes: Pupils are equal, round, and reactive to light.  Neck: Normal range of motion. Neck supple.  Cardiovascular: Normal rate and regular rhythm.   Pulmonary/Chest: Effort normal.  Abdominal: Soft. There is tenderness in the suprapubic area. There is no rigidity, no rebound, no guarding and no CVA tenderness.  Neurological: She is alert.  Skin: Skin is warm and dry.    ED Course   Procedures (including critical care time) Labs Review Labs Reviewed  URINALYSIS, ROUTINE W REFLEX MICROSCOPIC - Abnormal; Notable for the following:    APPearance CLOUDY (*)    Hgb urine dipstick LARGE (*)    Leukocytes, UA LARGE (*)    All other components within normal limits  URINE CULTURE  URINE MICROSCOPIC-ADD ON    Imaging Review No results found.   EKG Interpretation None      MDM   Final diagnoses:  Hemorrhagic cystitis    I discussed the case with Dr. Wilson Singer, He feels that Keflex would be appropriate for her UTI, does  not prolong the QT interval. He has seen the patient as well and agrees that she has a hemorrhagic cystitis. Urine Culture has been sent out.   The patient looks really well, no systemic symptoms. Had some pressure which quickly resolved and has no re-occured.  68 y.o.Nina Carpenter's evaluation in the Emergency Department is complete. It has been determined that no acute conditions requiring further emergency intervention are present at this time. The patient/guardian have been advised of the diagnosis and plan. We have discussed signs and symptoms that warrant return to the ED, such as changes or worsening in symptoms.  Vital signs are stable at discharge. Filed Vitals:   05/17/13 2316  BP: 161/88  Pulse: 80  Temp: 97.6 F (36.4 C)  Resp: 20    Patient/guardian has voiced understanding and agreed to follow-up with the PCP or specialist.   Linus Mako, PA-C 05/18/13 0102

## 2013-05-18 NOTE — Discharge Instructions (Signed)
Urinary Tract Infection  Urinary tract infections (UTIs) can develop anywhere along your urinary tract. Your urinary tract is your body's drainage system for removing wastes and extra water. Your urinary tract includes two kidneys, two ureters, a bladder, and a urethra. Your kidneys are a pair of bean-shaped organs. Each kidney is about the size of your fist. They are located below your ribs, one on each side of your spine.  CAUSES  Infections are caused by microbes, which are microscopic organisms, including fungi, viruses, and bacteria. These organisms are so small that they can only be seen through a microscope. Bacteria are the microbes that most commonly cause UTIs.  SYMPTOMS   Symptoms of UTIs may vary by age and gender of the patient and by the location of the infection. Symptoms in young women typically include a frequent and intense urge to urinate and a painful, burning feeling in the bladder or urethra during urination. Older women and men are more likely to be tired, shaky, and weak and have muscle aches and abdominal pain. A fever may mean the infection is in your kidneys. Other symptoms of a kidney infection include pain in your back or sides below the ribs, nausea, and vomiting.  DIAGNOSIS  To diagnose a UTI, your caregiver will ask you about your symptoms. Your caregiver also will ask to provide a urine sample. The urine sample will be tested for bacteria and white blood cells. White blood cells are made by your body to help fight infection.  TREATMENT   Typically, UTIs can be treated with medication. Because most UTIs are caused by a bacterial infection, they usually can be treated with the use of antibiotics. The choice of antibiotic and length of treatment depend on your symptoms and the type of bacteria causing your infection.  HOME CARE INSTRUCTIONS   If you were prescribed antibiotics, take them exactly as your caregiver instructs you. Finish the medication even if you feel better after you  have only taken some of the medication.   Drink enough water and fluids to keep your urine clear or pale yellow.   Avoid caffeine, tea, and carbonated beverages. They tend to irritate your bladder.   Empty your bladder often. Avoid holding urine for long periods of time.   Empty your bladder before and after sexual intercourse.   After a bowel movement, women should cleanse from front to back. Use each tissue only once.  SEEK MEDICAL CARE IF:    You have back pain.   You develop a fever.   Your symptoms do not begin to resolve within 3 days.  SEEK IMMEDIATE MEDICAL CARE IF:    You have severe back pain or lower abdominal pain.   You develop chills.   You have nausea or vomiting.   You have continued burning or discomfort with urination.  MAKE SURE YOU:    Understand these instructions.   Will watch your condition.   Will get help right away if you are not doing well or get worse.  Document Released: 10/19/2004 Document Revised: 07/11/2011 Document Reviewed: 02/17/2011  Exit[employer] Patient Information 2014 [employer].

## 2013-05-19 LAB — URINE CULTURE
Colony Count: 9000
SPECIAL REQUESTS: NORMAL

## 2013-05-29 ENCOUNTER — Encounter: Payer: Medicare Other | Admitting: Internal Medicine

## 2013-06-11 DIAGNOSIS — Z950 Presence of cardiac pacemaker: Secondary | ICD-10-CM | POA: Diagnosis not present

## 2013-06-11 DIAGNOSIS — R5383 Other fatigue: Secondary | ICD-10-CM | POA: Diagnosis not present

## 2013-06-11 DIAGNOSIS — I4891 Unspecified atrial fibrillation: Secondary | ICD-10-CM | POA: Diagnosis not present

## 2013-06-11 DIAGNOSIS — R5381 Other malaise: Secondary | ICD-10-CM | POA: Diagnosis not present

## 2013-06-11 DIAGNOSIS — R9431 Abnormal electrocardiogram [ECG] [EKG]: Secondary | ICD-10-CM | POA: Diagnosis not present

## 2013-06-17 ENCOUNTER — Other Ambulatory Visit: Payer: Self-pay

## 2013-06-17 MED ORDER — METOPROLOL TARTRATE 25 MG PO TABS: 25.0000 mg | ORAL_TABLET | Freq: Two times a day (BID) | ORAL | Status: DC

## 2013-06-17 NOTE — Telephone Encounter (Signed)
Refill sent for metoprolol tart  

## 2013-07-04 ENCOUNTER — Encounter: Payer: Self-pay | Admitting: Internal Medicine

## 2013-07-11 ENCOUNTER — Other Ambulatory Visit: Payer: Self-pay

## 2013-07-11 MED ORDER — APIXABAN 5 MG PO TABS: 5.0000 mg | ORAL_TABLET | Freq: Two times a day (BID) | ORAL | Status: DC

## 2013-08-13 ENCOUNTER — Encounter: Payer: Self-pay | Admitting: Internal Medicine

## 2013-08-18 ENCOUNTER — Ambulatory Visit (INDEPENDENT_AMBULATORY_CARE_PROVIDER_SITE_OTHER): Payer: Medicare Other | Admitting: Family Medicine

## 2013-08-18 ENCOUNTER — Encounter: Payer: Self-pay | Admitting: Family Medicine

## 2013-08-18 ENCOUNTER — Ambulatory Visit (INDEPENDENT_AMBULATORY_CARE_PROVIDER_SITE_OTHER)
Admission: RE | Admit: 2013-08-18 | Discharge: 2013-08-18 | Disposition: A | Discharge status: Home / Self Care | Payer: Medicare Other | Source: Ambulatory Visit | Attending: Family Medicine | Admitting: Family Medicine

## 2013-08-18 VITALS — BP 161/93 | HR 80 | Temp 98.3°F | Ht 63.0 in | Wt 189.8 lb

## 2013-08-18 DIAGNOSIS — R109 Unspecified abdominal pain: Secondary | ICD-10-CM

## 2013-08-18 DIAGNOSIS — I10 Essential (primary) hypertension: Secondary | ICD-10-CM

## 2013-08-18 DIAGNOSIS — I639 Cerebral infarction, unspecified: Secondary | ICD-10-CM

## 2013-08-18 DIAGNOSIS — I6350 Cerebral infarction due to unspecified occlusion or stenosis of unspecified cerebral artery: Secondary | ICD-10-CM | POA: Diagnosis not present

## 2013-08-18 LAB — POCT URINALYSIS DIPSTICK
Bilirubin, UA: NEGATIVE
Glucose, UA: NEGATIVE
Ketones, UA: NEGATIVE
Leukocytes, UA: NEGATIVE
Nitrite, UA: NEGATIVE
PROTEIN UA: NEGATIVE
SPEC GRAV UA: 1.01
UROBILINOGEN UA: 0.2
pH, UA: 5

## 2013-08-18 LAB — POCT UA - MICROSCOPIC ONLY

## 2013-08-18 MED ORDER — METOPROLOL TARTRATE 50 MG PO TABS: 50.0000 mg | ORAL_TABLET | Freq: Two times a day (BID) | ORAL | Status: DC

## 2013-08-18 MED ORDER — CYCLOBENZAPRINE HCL 10 MG PO TABS: 10.0000 mg | ORAL_TABLET | Freq: Three times a day (TID) | ORAL | Status: DC | PRN

## 2013-08-18 NOTE — Progress Notes (Signed)
[place] [address] Phone: [phone] Fax: [phone]  Patient ID: Nina Carpenter MRN: [id number], DOB: Sep 23, 1945, 68 y.o. Date of Encounter: 08/18/2013  Primary Physician:  [name], MD   Chief Complaint: Flank Pain   Subjective:   History of Present Illness:  Nina Carpenter is a 68 y.o. very pleasant female patient who presents with the following:  S/p Ablation at [facility] by Dr. Coy Saunas. Now off of Tikosyn. Her blood pressure is much higher compared to what it was previously when she was on some diltiazem and on some Lasix in addition to her current medications. Now her blood pressure is 160/90. We checked this 3 times today in the office.  No blood in her in her urine.   R lower back. Will drink a lot of water in the morning. Had some lemonade and it is subsiding.  This pain is in the right flank region to the right lower back region in the area of approximately L2 and lateral to this. No trauma, injury. No bloody urine. No dysuria.  Very bad pain.   Past Medical History, Surgical History, Social History, Family History, Problem List, Medications, and Allergies have been reviewed and updated if relevant.  Review of Systems:  GEN: No acute illnesses, no fevers, chills. GI: No n/v/d, eating normally Pulm: No SOB Interactive and getting along well at home.  Otherwise, ROS is as per the HPI.  Objective:   Physical Examination: BP 160/90  Pulse 81  Temp(Src) 98.3 F (36.8 C) (Oral)  Ht 5\' 3"  (1.6 m)  Wt 189 lb 12 oz (86.07 kg)  BMI 33.62 kg/m2   GEN: WDWN, NAD, Non-toxic, Alert & Oriented x 3 HEENT: Atraumatic, Normocephalic.  Ears and Nose: No external deformity. EXTR: No clubbing/cyanosis/edema NEURO: Normal gait.  PSYCH: Normally interactive. Conversant. Not depressed or anxious appearing.  Calm demeanor.   + CVAT No tenderness on the left side. No tenderness in the middle spinous region from L1-S1. There is tenderness around  the posterior ribs and on the erector spinae complex from L2-S1.  Neurovascularly intact in all lower extremities.  Laboratory and Imaging Data: Results for orders placed in visit on 08/18/13  POCT URINALYSIS DIPSTICK      Result Value Ref Range   Color, UA yellow     Clarity, UA clear     Glucose, UA negative     Bilirubin, UA negative     Ketones, UA negative     Spec Grav, UA 1.010     Blood, UA non-hemolyzed trace     pH, UA 5.0     Protein, UA negative     Urobilinogen, UA 0.2     Nitrite, UA negative     Leukocytes, UA Negative    POCT UA - MICROSCOPIC ONLY      Result Value Ref Range   WBC, Ur, HPF, POC rare     RBC, urine, microscopic rare     Bacteria, U Microscopic trace     Mucus, UA       Epithelial cells, urine per micros rare     Crystals, Ur, HPF, POC       Casts, Ur, LPF, POC       Yeast, UA         Assessment & Plan:   Right flank pain - Plan: POCT urinalysis dipstick, DG Abd 1 View, POCT UA - Microscopic Only  CVA (cerebral vascular accident)  HYPERTENSION  Most likely musculoskeletal,  cannot exclude kidney stone. Check KUB. No blood in urine.  Moist heat, massage, Flexeril when necessary.  Hypertension is out of control. Increase Lopressor, check at home, follow up 1 month.  New Prescriptions   CYCLOBENZAPRINE (FLEXERIL) 10 MG TABLET    Take 1 tablet (10 mg total) by mouth 3 (three) times daily as needed for muscle spasms.   Modified Medications   Modified Medication Previous Medication   METOPROLOL TARTRATE (LOPRESSOR) 50 MG TABLET metoprolol tartrate (LOPRESSOR) 25 MG tablet      Take 1 tablet (50 mg total) by mouth 2 (two) times daily.    Take 1 tablet (25 mg total) by mouth 2 (two) times daily.   Orders Placed This Encounter  Procedures  . DG Abd 1 View  . POCT urinalysis dipstick  . POCT UA - Microscopic Only   Follow-up: Return in about 1 month (around 09/18/2013). Unless noted above, the patient is to follow-up if symptoms worsen.  Red flags were reviewed with the patient.  Signed,  Maud Deed. Clorissa Gruenberg, MD, [facility] Sports Medicine   Discontinued Medications   CEPHALEXIN (KEFLEX) 500 MG CAPSULE    Take 1 capsule (500 mg total) by mouth 4 (four) times daily.   DOFETILIDE (TIKOSYN) 250 MCG CAPSULE    Take 1 capsule (250 mcg total) by mouth every 12 (twelve) hours.   Current Medications at Discharge:   Medication List       This list is accurate as of: 08/18/13  6:15 PM.  Always use your most recent med list.               apixaban 5 MG Tabs tablet  Commonly known as:  ELIQUIS  Take 1 tablet (5 mg total) by mouth 2 (two) times daily.     cyclobenzaprine 10 MG tablet  Commonly known as:  FLEXERIL  Take 1 tablet (10 mg total) by mouth 3 (three) times daily as needed for muscle spasms.     losartan 100 MG tablet  Commonly known as:  COZAAR  take 1 tablet by mouth once daily     metFORMIN 500 MG 24 hr tablet  Commonly known as:  GLUCOPHAGE XR  Take 1 tablet (500 mg total) by mouth daily with breakfast.     metoprolol 50 MG tablet  Commonly known as:  LOPRESSOR  Take 1 tablet (50 mg total) by mouth 2 (two) times daily.     pantoprazole 40 MG tablet  Commonly known as:  PROTONIX  Take 40 mg by mouth daily as needed (for acid reflux).     pravastatin 80 MG tablet  Commonly known as:  PRAVACHOL  Take 1 tablet (80 mg total) by mouth daily.

## 2013-08-18 NOTE — Progress Notes (Signed)
Pre visit review using our clinic review tool, if applicable. No additional management support is needed unless otherwise documented below in the visit note. 

## 2013-08-19 MED ORDER — TIZANIDINE HCL 4 MG PO TABS: 4.0000 mg | ORAL_TABLET | Freq: Every day | ORAL | Status: DC

## 2013-08-19 NOTE — Addendum Note (Signed)
Addended by: Carter Kitten on: 08/19/2013 10:10 AM   Modules accepted: Orders

## 2013-08-24 ENCOUNTER — Other Ambulatory Visit: Payer: Self-pay | Admitting: Family Medicine

## 2013-09-09 ENCOUNTER — Other Ambulatory Visit: Payer: Self-pay

## 2013-09-09 MED ORDER — APIXABAN 5 MG PO TABS: 5.0000 mg | ORAL_TABLET | Freq: Two times a day (BID) | ORAL | Status: DC

## 2013-09-09 NOTE — Telephone Encounter (Signed)
Refill sent for eliquis  

## 2013-09-11 ENCOUNTER — Ambulatory Visit (INDEPENDENT_AMBULATORY_CARE_PROVIDER_SITE_OTHER): Payer: Medicare Other | Admitting: *Deleted

## 2013-09-11 DIAGNOSIS — I495 Sick sinus syndrome: Secondary | ICD-10-CM

## 2013-09-11 DIAGNOSIS — I4891 Unspecified atrial fibrillation: Secondary | ICD-10-CM

## 2013-09-11 LAB — MDC_IDC_ENUM_SESS_TYPE_REMOTE
Battery Remaining Longevity: 110 mo
Brady Statistic AP VP Percent: 0.06 %
Brady Statistic AP VS Percent: 32.88 %
Brady Statistic AS VS Percent: 67.04 %
Brady Statistic RV Percent Paced: 0.08 %
Date Time Interrogation Session: 20150820194846
Lead Channel Impedance Value: 361 Ohm
Lead Channel Impedance Value: 418 Ohm
Lead Channel Impedance Value: 418 Ohm
Lead Channel Pacing Threshold Amplitude: 0.5 V
Lead Channel Pacing Threshold Pulse Width: 0.4 ms
Lead Channel Pacing Threshold Pulse Width: 0.4 ms
Lead Channel Sensing Intrinsic Amplitude: 4.5 mV
Lead Channel Setting Pacing Amplitude: 2 V
Lead Channel Setting Pacing Amplitude: 2.5 V
Lead Channel Setting Sensing Sensitivity: 2 mV
MDC IDC MSMT BATTERY VOLTAGE: 3.02 V
MDC IDC MSMT LEADCHNL RA PACING THRESHOLD AMPLITUDE: 0.5 V
MDC IDC MSMT LEADCHNL RA SENSING INTR AMPL: 2 mV
MDC IDC MSMT LEADCHNL RV IMPEDANCE VALUE: 475 Ohm
MDC IDC SET LEADCHNL RV PACING PULSEWIDTH: 0.4 ms
MDC IDC STAT BRADY AS VP PERCENT: 0.03 %
MDC IDC STAT BRADY RA PERCENT PACED: 32.94 %
Zone Setting Detection Interval: 400 ms
Zone Setting Detection Interval: 400 ms

## 2013-09-12 NOTE — Progress Notes (Signed)
Remote pacemaker transmission.   

## 2013-09-17 ENCOUNTER — Encounter: Payer: Self-pay | Admitting: Internal Medicine

## 2013-09-23 ENCOUNTER — Encounter: Payer: Self-pay | Admitting: Cardiology

## 2013-10-06 ENCOUNTER — Encounter: Payer: Self-pay | Admitting: Family Medicine

## 2013-10-06 ENCOUNTER — Ambulatory Visit (INDEPENDENT_AMBULATORY_CARE_PROVIDER_SITE_OTHER): Payer: Medicare Other | Admitting: Family Medicine

## 2013-10-06 VITALS — BP 182/112 | HR 85 | Temp 98.6°F | Ht 63.0 in | Wt 193.0 lb

## 2013-10-06 DIAGNOSIS — Z23 Encounter for immunization: Secondary | ICD-10-CM

## 2013-10-06 DIAGNOSIS — I6350 Cerebral infarction due to unspecified occlusion or stenosis of unspecified cerebral artery: Secondary | ICD-10-CM

## 2013-10-06 DIAGNOSIS — Z79899 Other long term (current) drug therapy: Secondary | ICD-10-CM

## 2013-10-06 DIAGNOSIS — I798 Other disorders of arteries, arterioles and capillaries in diseases classified elsewhere: Secondary | ICD-10-CM | POA: Diagnosis not present

## 2013-10-06 DIAGNOSIS — R5381 Other malaise: Secondary | ICD-10-CM

## 2013-10-06 DIAGNOSIS — I10 Essential (primary) hypertension: Secondary | ICD-10-CM | POA: Diagnosis not present

## 2013-10-06 DIAGNOSIS — E785 Hyperlipidemia, unspecified: Secondary | ICD-10-CM

## 2013-10-06 DIAGNOSIS — E1159 Type 2 diabetes mellitus with other circulatory complications: Secondary | ICD-10-CM

## 2013-10-06 DIAGNOSIS — R5383 Other fatigue: Secondary | ICD-10-CM

## 2013-10-06 LAB — CBC WITH DIFFERENTIAL/PLATELET
BASOS PCT: 0.9 % (ref 0.0–3.0)
Basophils Absolute: 0.1 10*3/uL (ref 0.0–0.1)
EOS ABS: 0.2 10*3/uL (ref 0.0–0.7)
EOS PCT: 2.2 % (ref 0.0–5.0)
HCT: 43.9 % (ref 36.0–46.0)
HEMOGLOBIN: 14.6 g/dL (ref 12.0–15.0)
LYMPHS PCT: 32.3 % (ref 12.0–46.0)
Lymphs Abs: 2.3 10*3/uL (ref 0.7–4.0)
MCHC: 33.2 g/dL (ref 30.0–36.0)
MCV: 91.8 fl (ref 78.0–100.0)
Monocytes Absolute: 0.6 10*3/uL (ref 0.1–1.0)
Monocytes Relative: 8 % (ref 3.0–12.0)
NEUTROS ABS: 4 10*3/uL (ref 1.4–7.7)
Neutrophils Relative %: 56.6 % (ref 43.0–77.0)
Platelets: 230 10*3/uL (ref 150.0–400.0)
RBC: 4.78 Mil/uL (ref 3.87–5.11)
RDW: 13.2 % (ref 11.5–15.5)
WBC: 7.1 10*3/uL (ref 4.0–10.5)

## 2013-10-06 LAB — HEPATIC FUNCTION PANEL
ALBUMIN: 4.2 g/dL (ref 3.5–5.2)
ALT: 27 U/L (ref 0–35)
AST: 22 U/L (ref 0–37)
Alkaline Phosphatase: 72 U/L (ref 39–117)
Bilirubin, Direct: 0 mg/dL (ref 0.0–0.3)
Total Bilirubin: 0.6 mg/dL (ref 0.2–1.2)
Total Protein: 7.3 g/dL (ref 6.0–8.3)

## 2013-10-06 LAB — BASIC METABOLIC PANEL
BUN: 18 mg/dL (ref 6–23)
CALCIUM: 9.3 mg/dL (ref 8.4–10.5)
CHLORIDE: 104 meq/L (ref 96–112)
CO2: 26 meq/L (ref 19–32)
CREATININE: 1 mg/dL (ref 0.4–1.2)
GFR: 61.39 mL/min (ref >60.00)
Glucose, Bld: 162 mg/dL — ABNORMAL HIGH (ref 70–99)
Potassium: 4.5 mEq/L (ref 3.5–5.1)
Sodium: 138 mEq/L (ref 135–145)

## 2013-10-06 LAB — LIPID PANEL
CHOLESTEROL: 199 mg/dL (ref 0–200)
HDL: 46 mg/dL (ref >39.00)
LDL Cholesterol: 115 mg/dL — ABNORMAL HIGH (ref 0–99)
NonHDL: 153
TRIGLYCERIDES: 192 mg/dL — AB (ref 0.0–149.0)
Total CHOL/HDL Ratio: 4
VLDL: 38.4 mg/dL (ref 0.0–40.0)

## 2013-10-06 LAB — HEMOGLOBIN A1C: Hgb A1c MFr Bld: 7.6 % — ABNORMAL HIGH (ref 4.6–6.5)

## 2013-10-06 MED ORDER — METOPROLOL TARTRATE 100 MG PO TABS: 100.0000 mg | ORAL_TABLET | Freq: Two times a day (BID) | ORAL | Status: DC

## 2013-10-06 NOTE — Progress Notes (Signed)
Dr. Frederico Hamman T. Wen Merced, MD, [facility] [place], [address] Phone: [phone] Fax: [phone]  10/06/2013  Patient: Nina Carpenter, MRN: [id number], DOB: 12-27-1945, 68 y.o.  Primary Physician:  [name], MD  Chief Complaint: Follow-up  Subjective:   Nina Carpenter is a 68 y.o. very pleasant female patient who presents with the following:  H/o CVA, AF s/p ablation, pacemaker, HTN, chronic anticoagulation and DM.  HTN:  Markedly elevated today. 175/95 on my check. BP Readings from Last 3 Encounters:  10/06/13 182/112  08/18/13 161/93  05/18/13 161/82    We increased her beta-blocker on last OV.  Diabetes Mellitus: Tolerating Medications: yes Compliance with diet: fair Exercise: minimal / intermittent Avg blood sugars at home: not checking Foot problems: none Hypoglycemia: none No nausea, vomitting, blurred vision, polyuria.  Lab Results  Component Value Date   HGBA1C 7.7* 04/07/2013   HGBA1C 7.9* 12/29/2012   HGBA1C 7.8* 12/28/2012   Lab Results  Component Value Date   MICROALBUR 2.6* 11/07/2011   LDLCALC 154* 12/28/2012   CREATININE 0.91 03/31/2013    Wt Readings from Last 3 Encounters:  10/06/13 193 lb (87.544 kg)  08/18/13 189 lb 12 oz (86.07 kg)  05/17/13 186 lb (84.369 kg)    Body mass index is 34.2 kg/(m^2).   Lab Results  Component Value Date   HGBA1C 7.7* 04/07/2013    Immunization History  Administered Date(s) Administered  . Influenza Split 11/13/2011  . Influenza,inj,Quad PF,36+ Mos 11/18/2012  . Pneumococcal Polysaccharide-23 10/11/2010    Prevnar-13 Flu shot  Past Medical History, Surgical History, Social History, Family History, Problem List, Medications, and Allergies have been reviewed and updated if relevant.   GEN: No acute illnesses, no fevers, chills. GI: No n/v/d, eating normally Pulm: No SOB Interactive and getting along well at home.  Otherwise, ROS  is as per the HPI.  Objective:   BP 182/112  Pulse 85  Temp(Src) 98.6 F (37 C) (Oral)  Ht 5\' 3"  (1.6 m)  Wt 193 lb (87.544 kg)  BMI 34.20 kg/m2  GEN: WDWN, NAD, Non-toxic, A & O x 3 HEENT: Atraumatic, Normocephalic. Neck supple. No masses, No LAD. Ears and Nose: No external deformity. CV: RRR, No M/G/R. No JVD. No thrill. No extra heart sounds. PULM: CTA B, no wheezes, crackles, rhonchi. No retractions. No resp. distress. No accessory muscle use. EXTR: No c/c/e NEURO Normal gait.  PSYCH: Normally interactive. Conversant. Not depressed or anxious appearing.  Calm demeanor.   Laboratory and Imaging Data:  Assessment and Plan:   Type 2 diabetes mellitus with vascular disease - Plan: Basic metabolic panel, Hemoglobin A1c  HYPERLIPIDEMIA - Plan: Lipid panel  HYPERTENSION  Encounter for long-term (current) use of other medications - Plan: Hepatic function panel, CBC with Differential  Need for prophylactic vaccination and inoculation against influenza - Plan: Flu Vaccine QUAD 36+ mos IM  Need for prophylactic vaccination against Streptococcus pneumoniae (pneumococcus) - Plan: Pneumococcal conjugate vaccine 13-valent  Increased lopressor to 100 mg bid. Pulse is in the 80's today. I am concerned about her BP levels, though more normal at home.   Check all labs  Follow-up: Return in about 3 weeks (around 10/27/2013).  New Prescriptions   No medications on file   Orders Placed This Encounter  Procedures  . Flu Vaccine QUAD 36+ mos IM  . Pneumococcal conjugate vaccine 13-valent  . Basic metabolic panel  . Hepatic function panel  .  Hemoglobin A1c  . Lipid panel  . CBC with Differential    Signed,  Rabab Currington T. Lizette Pazos, MD   Patient's Medications  New Prescriptions   No medications on file  Previous Medications   APIXABAN (ELIQUIS) 5 MG TABS TABLET    Take 1 tablet (5 mg total) by mouth 2 (two) times daily.   LOSARTAN (COZAAR) 100 MG TABLET    take 1 tablet by  mouth once daily   METFORMIN (GLUCOPHAGE XR) 500 MG 24 HR TABLET    Take 1 tablet (500 mg total) by mouth daily with breakfast.   PANTOPRAZOLE (PROTONIX) 40 MG TABLET    Take 40 mg by mouth daily as needed (for acid reflux).   PRAVASTATIN (PRAVACHOL) 80 MG TABLET    take 1 tablet by mouth once daily   TIZANIDINE (ZANAFLEX) 4 MG TABLET    Take 1 tablet (4 mg total) by mouth at bedtime.  Modified Medications   Modified Medication Previous Medication   METOPROLOL (LOPRESSOR) 100 MG TABLET metoprolol tartrate (LOPRESSOR) 50 MG tablet      Take 1 tablet (100 mg total) by mouth 2 (two) times daily.    Take 1 tablet (50 mg total) by mouth 2 (two) times daily.  Discontinued Medications   No medications on file

## 2013-10-06 NOTE — Patient Instructions (Signed)
Metoprolol Tartrate - increase to two tablets twice a day.   When you pick up the new prescription, it is twice as strong. 100 mg twice a day.

## 2013-10-06 NOTE — Progress Notes (Signed)
Pre visit review using our clinic review tool, if applicable. No additional management support is needed unless otherwise documented below in the visit note. 

## 2013-10-14 ENCOUNTER — Encounter: Payer: Self-pay | Admitting: Internal Medicine

## 2013-10-20 ENCOUNTER — Other Ambulatory Visit: Payer: Self-pay | Admitting: Family Medicine

## 2013-10-27 ENCOUNTER — Ambulatory Visit: Payer: Medicare Other | Admitting: Family Medicine

## 2013-11-03 ENCOUNTER — Ambulatory Visit (INDEPENDENT_AMBULATORY_CARE_PROVIDER_SITE_OTHER): Payer: Medicare Other | Admitting: Family Medicine

## 2013-11-03 ENCOUNTER — Encounter: Payer: Self-pay | Admitting: Family Medicine

## 2013-11-03 VITALS — BP 140/76 | HR 86 | Temp 98.7°F | Ht 63.0 in | Wt 190.0 lb

## 2013-11-03 DIAGNOSIS — I639 Cerebral infarction, unspecified: Secondary | ICD-10-CM

## 2013-11-03 DIAGNOSIS — I10 Essential (primary) hypertension: Secondary | ICD-10-CM

## 2013-11-03 NOTE — Progress Notes (Signed)
Pre visit review using our clinic review tool, if applicable. No additional management support is needed unless otherwise documented below in the visit note. 

## 2013-11-03 NOTE — Progress Notes (Signed)
Dr. Frederico Hamman T. Natosha Bou, MD, [facility] Primary [facility] and Sports Medicine [place], [address] Phone: [phone] Fax: [phone]  11/03/2013  Patient: Nina Carpenter, MRN: [id number], DOB: 1945/02/27, 68 y.o.  Primary Physician:  [name], MD  Chief Complaint: Follow-up  Subjective:   Nina Carpenter is a 68 y.o. very pleasant female patient who presents with the following:  BP f/u: we have titrated up her beta-blocker. Pulse still normal and tolerating. BP much better today.  130/70. 140/78  HTN: Tolerating all medications without side effects Stable and at goal No CP, no sob. No HA.  BP Readings from Last 3 Encounters:  11/03/13 140/76  10/06/13 182/112  08/18/13 315/40    Basic Metabolic Panel:    Component Value Date/Time   NA 138 10/06/2013 0907   NA 141 03/31/2013 0918   K 4.5 10/06/2013 0907   CL 104 10/06/2013 0907   CO2 26 10/06/2013 0907   BUN 18 10/06/2013 0907   BUN 19 03/31/2013 0918   CREATININE 1.0 10/06/2013 0907   GLUCOSE 162* 10/06/2013 0907   GLUCOSE 181* 03/31/2013 0918   CALCIUM 9.3 10/06/2013 0907      Past Medical History, Surgical History, Social History, Family History, Problem List, Medications, and Allergies have been reviewed and updated if relevant.   GEN: No acute illnesses, no fevers, chills. GI: No n/v/d, eating normally Pulm: No SOB Interactive and getting along well at home.  Otherwise, ROS is as per the HPI.  Objective:   BP 140/76  Pulse 86  Temp(Src) 98.7 F (37.1 C) (Oral)  Ht 5\' 3"  (1.6 m)  Wt 190 lb (86.183 kg)  BMI 33.67 kg/m2  GEN: WDWN, NAD, Non-toxic, A & O x 3 HEENT: Atraumatic, Normocephalic. Neck supple. No masses, No LAD. Ears and Nose: No external deformity. CV: RRR, No M/G/R. No JVD. No thrill. No extra heart sounds. PULM: CTA B, no wheezes, crackles, rhonchi. No retractions. No resp. distress. No accessory muscle use. EXTR: No c/c/e NEURO Normal gait.  PSYCH:  Normally interactive. Conversant. Not depressed or anxious appearing.  Calm demeanor.   Laboratory and Imaging Data:  Assessment and Plan:   Essential hypertension  Improved with recent changes  Follow-up: Return in about 6 months (around 05/05/2014).  New Prescriptions   No medications on file   No orders of the defined types were placed in this encounter.    Signed,  Maud Deed. Shivangi Lutz, MD   Patient's Medications  New Prescriptions   No medications on file  Previous Medications   APIXABAN (ELIQUIS) 5 MG TABS TABLET    Take 1 tablet (5 mg total) by mouth 2 (two) times daily.   LOSARTAN (COZAAR) 100 MG TABLET    take 1 tablet by mouth once daily   METFORMIN (GLUCOPHAGE XR) 500 MG 24 HR TABLET    Take 1 tablet (500 mg total) by mouth daily with breakfast.   METOPROLOL (LOPRESSOR) 100 MG TABLET    Take 1 tablet (100 mg total) by mouth 2 (two) times daily.   PANTOPRAZOLE (PROTONIX) 40 MG TABLET    Take 40 mg by mouth daily as needed (for acid reflux).   PRAVASTATIN (PRAVACHOL) 80 MG TABLET    take 1 tablet by mouth once daily   TIZANIDINE (ZANAFLEX) 4 MG TABLET    Take 1 tablet (4 mg total) by mouth at bedtime.  Modified Medications   No medications on file  Discontinued Medications   No  medications on file

## 2013-11-04 ENCOUNTER — Telehealth: Payer: Self-pay | Admitting: Family Medicine

## 2013-11-04 NOTE — Telephone Encounter (Signed)
emmi emailed °

## 2013-11-07 ENCOUNTER — Other Ambulatory Visit: Payer: Self-pay

## 2013-12-15 ENCOUNTER — Ambulatory Visit (INDEPENDENT_AMBULATORY_CARE_PROVIDER_SITE_OTHER): Payer: Medicare Other | Admitting: *Deleted

## 2013-12-15 DIAGNOSIS — I495 Sick sinus syndrome: Secondary | ICD-10-CM

## 2013-12-15 LAB — MDC_IDC_ENUM_SESS_TYPE_REMOTE
Battery Remaining Longevity: 99 mo
Brady Statistic AS VS Percent: 46.5 %
Brady Statistic RA Percent Paced: 53.48 %
Brady Statistic RV Percent Paced: 0.11 %
Lead Channel Impedance Value: 399 Ohm
Lead Channel Pacing Threshold Amplitude: 0.5 V
Lead Channel Pacing Threshold Amplitude: 0.5 V
Lead Channel Pacing Threshold Pulse Width: 0.4 ms
Lead Channel Pacing Threshold Pulse Width: 0.4 ms
Lead Channel Sensing Intrinsic Amplitude: 1.375 mV
Lead Channel Sensing Intrinsic Amplitude: 5.25 mV
Lead Channel Sensing Intrinsic Amplitude: 5.25 mV
Lead Channel Setting Pacing Amplitude: 2 V
Lead Channel Setting Pacing Amplitude: 2.5 V
Lead Channel Setting Pacing Pulse Width: 0.4 ms
MDC IDC MSMT BATTERY VOLTAGE: 3.01 V
MDC IDC MSMT LEADCHNL RA IMPEDANCE VALUE: 456 Ohm
MDC IDC MSMT LEADCHNL RA SENSING INTR AMPL: 1.375 mV
MDC IDC MSMT LEADCHNL RV IMPEDANCE VALUE: 456 Ohm
MDC IDC MSMT LEADCHNL RV IMPEDANCE VALUE: 513 Ohm
MDC IDC SESS DTM: 20151123191330
MDC IDC SET LEADCHNL RV SENSING SENSITIVITY: 2 mV
MDC IDC SET ZONE DETECTION INTERVAL: 400 ms
MDC IDC STAT BRADY AP VP PERCENT: 0.09 %
MDC IDC STAT BRADY AP VS PERCENT: 53.38 %
MDC IDC STAT BRADY AS VP PERCENT: 0.02 %
Zone Setting Detection Interval: 400 ms

## 2013-12-15 NOTE — Progress Notes (Signed)
Remote pacemaker transmission.   

## 2013-12-30 ENCOUNTER — Ambulatory Visit (INDEPENDENT_AMBULATORY_CARE_PROVIDER_SITE_OTHER): Payer: Medicare Other | Admitting: Internal Medicine

## 2013-12-30 ENCOUNTER — Encounter: Payer: Self-pay | Admitting: Internal Medicine

## 2013-12-30 VITALS — BP 142/70 | HR 83 | Temp 98.4°F | Wt 192.0 lb

## 2013-12-30 DIAGNOSIS — I639 Cerebral infarction, unspecified: Secondary | ICD-10-CM

## 2013-12-30 DIAGNOSIS — J309 Allergic rhinitis, unspecified: Secondary | ICD-10-CM

## 2013-12-30 DIAGNOSIS — B349 Viral infection, unspecified: Secondary | ICD-10-CM | POA: Diagnosis not present

## 2013-12-30 DIAGNOSIS — J329 Chronic sinusitis, unspecified: Secondary | ICD-10-CM | POA: Diagnosis not present

## 2013-12-30 DIAGNOSIS — B9789 Other viral agents as the cause of diseases classified elsewhere: Secondary | ICD-10-CM

## 2013-12-30 MED ORDER — MOMETASONE FUROATE 50 MCG/ACT NA SUSP: 2.0000 | Freq: Every day | NASAL | Status: DC

## 2013-12-30 NOTE — Progress Notes (Signed)
HPI  Pt presents to the clinic today with c/o ear fullness, sore throat and post nasal drip.She reports this started about 4 days ago. She is blowing white mucous out of her nose. She denies fever, chills or body aches. She has tried Tylenol OTC without much relief. She does have a history of allergies and asthma.   Review of Systems    Past Medical History  Diagnosis Date  . Allergy   . Asthma     a. Remotely.  . Type 2 diabetes mellitus   . GERD (gastroesophageal reflux disease)   . Hyperlipidemia   . Hypertension   . Kidney stones     a. Remotely.  Marland Kitchen Hx of rheumatic fever     a. As a child.  Marland Kitchen PAF (paroxysmal atrial fibrillation)     a. Dx 09/2012, chronicity unclear at that time. Initially refused Xarelto due to risk of bleeding. b. Sustained CVA 12/2012 - agreeable to taking Eliquis. Tachybrady syndrome during that admission s/p Medtronic PPM implantation;  c. Failed flecainide, sotalol, amiodarone (itching), and propafenone.  . Obesity   . CVA (cerebral vascular accident)     a. Thromboemoblic L MCA CVA 35/5732 with no residual deficit except mild occasional word finding. b. 2-02% RICA/LICA by dopplers 54/2/70.  . Tachy-brady syndrome     a. AF RVR with pauses >5sec requiring Medtronic PPM 12/2012.  . H/O medication noncompliance     a. Per PCP note 01/06/13: Previously refused anything greater than aspirin, refused treatment for DM, and often stopped hypertensive meds and lipid meds.  . Chronic combined systolic and diastolic CHF (congestive heart failure)     a. Felt related to AF. EF 45-50% by echo 12/2012.  Marland Kitchen Acid reflux disease     Family History  Problem Relation Age of Onset  . Stroke Mother   . Diabetes Mother     History   Social History  . Marital Status: Married    Spouse Name: N/A    Number of Children: 2  . Years of Education: N/A   Occupational History  . cosmetologist    Social History Main Topics  . Smoking status: Former Smoker -- 2.00  packs/day    Types: Cigarettes    Quit date: 01/24/1968  . Smokeless tobacco: Never Used  . Alcohol Use: 0.6 oz/week    1 Glasses of wine per week     Comment: rare use  . Drug Use: No  . Sexual Activity: Yes   Other Topics Concern  . Not on file   Social History Narrative   Lives in [place] with her husband.  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    Allergies  Allergen Reactions  . Amiodarone Itching  . Crestor [Rosuvastatin]     myalgia  . Fish Allergy Swelling    Shell fish  . Iodine Itching    itching  . Lipitor [Atorvastatin]     myalgias  . Penicillins Hives and Itching  . Ace Inhibitors Cough     Constitutional: Positive headache and malaise. Denies fever or  abrupt weight changes.  HEENT:  Positive ear fullness, nasal congestion and sore throat. Denies eye redness, runny nose or bloody nose. Respiratory: Positive cough. Denies difficulty breathing or shortness of breath.  Cardiovascular: Denies chest pain, chest tightness, palpitations or swelling in the hands or feet.   No other specific complaints in a complete review of systems (except as listed in HPI above).  Objective:  BP 142/70 mmHg  Pulse 83  Temp(Src) 98.4 F (36.9 C) (Oral)  Wt 192 lb (87.091 kg)  SpO2 98%  General: Appears her stated age, obese in NAD. HEENT: Head: normal shape and size, nosinus tenderness noted;  Ears: Tm's gray and intact, normal light reflex, + serous effusion on the right; Nose: mucosa boggy and moist, septum midline; Throat/Mouth: + PND. Teeth present, mucosa pink and moist, no exudate noted, no lesions or ulcerations noted. No adenopathy noted. Cardiovascular: Normal rate and rhythm. S1,S2 noted.  No murmur, rubs or gallops noted.  Pulmonary/Chest: Normal effort and positive vesicular breath sounds. No respiratory distress. No wheezes, rales or ronchi noted.      Assessment & Plan:   Allergic Rhinitis/Viral Sinusitis  Can use a Neti Pot which can be purchased  from your local drug store. eRx for Nasonex x 1 week OK to take Tylenol OTC Watch for fever, chills, facial pain or colored mucous  RTC as needed or if symptoms persist.

## 2013-12-30 NOTE — Patient Instructions (Signed)

## 2013-12-30 NOTE — Progress Notes (Signed)
Pre visit review using our clinic review tool, if applicable. No additional management support is needed unless otherwise documented below in the visit note. 

## 2014-01-01 ENCOUNTER — Encounter (HOSPITAL_COMMUNITY): Payer: Self-pay | Admitting: Internal Medicine

## 2014-01-14 ENCOUNTER — Encounter: Payer: Self-pay | Admitting: Cardiology

## 2014-01-26 ENCOUNTER — Encounter: Payer: Self-pay | Admitting: Internal Medicine

## 2014-01-28 ENCOUNTER — Encounter: Payer: Self-pay | Admitting: Cardiology

## 2014-02-02 ENCOUNTER — Other Ambulatory Visit: Payer: Self-pay | Admitting: Family Medicine

## 2014-02-11 ENCOUNTER — Telehealth: Payer: Self-pay | Admitting: *Deleted

## 2014-02-11 NOTE — Telephone Encounter (Signed)
Patient has a pacemaker  She called to ask if she would be allowed to use a tanning bed or if that is not allowed with having a pacemaker?

## 2014-02-11 NOTE — Telephone Encounter (Signed)
Patient has a pacemaker and wants to know if she can lay in a tanning bed. Please call patient.

## 2014-02-11 NOTE — Telephone Encounter (Signed)
LVM 1/20

## 2014-02-11 NOTE — Telephone Encounter (Signed)
Reviewed with Terrence Dupont, RN in device clinic -- no contraindications. (Although you may want to review the other precautions of tanning)

## 2014-03-02 DIAGNOSIS — D225 Melanocytic nevi of trunk: Secondary | ICD-10-CM | POA: Diagnosis not present

## 2014-03-02 DIAGNOSIS — L821 Other seborrheic keratosis: Secondary | ICD-10-CM | POA: Diagnosis not present

## 2014-03-02 DIAGNOSIS — Z411 Encounter for cosmetic surgery: Secondary | ICD-10-CM | POA: Diagnosis not present

## 2014-03-02 DIAGNOSIS — L918 Other hypertrophic disorders of the skin: Secondary | ICD-10-CM | POA: Diagnosis not present

## 2014-03-02 DIAGNOSIS — D2271 Melanocytic nevi of right lower limb, including hip: Secondary | ICD-10-CM | POA: Diagnosis not present

## 2014-03-24 ENCOUNTER — Ambulatory Visit (INDEPENDENT_AMBULATORY_CARE_PROVIDER_SITE_OTHER): Payer: Medicare Other | Admitting: Internal Medicine

## 2014-03-24 ENCOUNTER — Encounter: Payer: Self-pay | Admitting: Internal Medicine

## 2014-03-24 VITALS — BP 168/89 | HR 80 | Ht 63.0 in | Wt 195.5 lb

## 2014-03-24 DIAGNOSIS — E119 Type 2 diabetes mellitus without complications: Secondary | ICD-10-CM | POA: Diagnosis not present

## 2014-03-24 DIAGNOSIS — I480 Paroxysmal atrial fibrillation: Secondary | ICD-10-CM | POA: Diagnosis not present

## 2014-03-24 DIAGNOSIS — I10 Essential (primary) hypertension: Secondary | ICD-10-CM | POA: Diagnosis not present

## 2014-03-24 DIAGNOSIS — G4730 Sleep apnea, unspecified: Secondary | ICD-10-CM

## 2014-03-24 DIAGNOSIS — I495 Sick sinus syndrome: Secondary | ICD-10-CM

## 2014-03-24 DIAGNOSIS — I509 Heart failure, unspecified: Secondary | ICD-10-CM

## 2014-03-24 DIAGNOSIS — I4891 Unspecified atrial fibrillation: Secondary | ICD-10-CM

## 2014-03-24 LAB — MDC_IDC_ENUM_SESS_TYPE_INCLINIC
Battery Remaining Longevity: 98 mo
Brady Statistic AP VP Percent: 0.09 %
Brady Statistic AP VS Percent: 46.43 %
Brady Statistic AS VS Percent: 53.46 %
Brady Statistic RV Percent Paced: 0.11 %
Date Time Interrogation Session: 20160301103143
Lead Channel Impedance Value: 399 Ohm
Lead Channel Impedance Value: 418 Ohm
Lead Channel Impedance Value: 456 Ohm
Lead Channel Pacing Threshold Amplitude: 0.5 V
Lead Channel Pacing Threshold Amplitude: 0.5 V
Lead Channel Pacing Threshold Pulse Width: 0.4 ms
Lead Channel Sensing Intrinsic Amplitude: 2.5 mV
Lead Channel Sensing Intrinsic Amplitude: 2.75 mV
Lead Channel Sensing Intrinsic Amplitude: 6.25 mV
Lead Channel Setting Pacing Amplitude: 2 V
Lead Channel Setting Pacing Amplitude: 2.5 V
MDC IDC MSMT BATTERY VOLTAGE: 3.01 V
MDC IDC MSMT LEADCHNL RA IMPEDANCE VALUE: 361 Ohm
MDC IDC MSMT LEADCHNL RV PACING THRESHOLD PULSEWIDTH: 0.4 ms
MDC IDC MSMT LEADCHNL RV SENSING INTR AMPL: 5 mV
MDC IDC SET LEADCHNL RV PACING PULSEWIDTH: 0.4 ms
MDC IDC SET LEADCHNL RV SENSING SENSITIVITY: 2 mV
MDC IDC STAT BRADY AS VP PERCENT: 0.02 %
MDC IDC STAT BRADY RA PERCENT PACED: 46.51 %
Zone Setting Detection Interval: 400 ms
Zone Setting Detection Interval: 400 ms

## 2014-03-24 MED ORDER — LABETALOL HCL 200 MG PO TABS: 200.0000 mg | ORAL_TABLET | Freq: Two times a day (BID) | ORAL | Status: DC

## 2014-03-24 NOTE — Progress Notes (Signed)
Patient Care Team: [name], MD as PCP - General   HPI  Nina Carpenter is a 69 y.o. female Seen in follow-up for atrial fibrillation. She was to undergo PVI at [facility] which she did last spring. There is been a marked diminution in atrial fibrillation symptoms and by device interrogation burden as well. She does have some nonsustained atrial tachycardia/slow flutter.  She continues to struggle with hypertension. Her blood pressures at home are in the 150 range. She has still not undertaken her sleep study as relates to sleep apnea.  She denies chest pain shortness of breath or edema.  She has a history of bradycardia and prior pacemaker implantation. Past Medical History  Diagnosis Date  . Allergy   . Asthma     a. Remotely.  . Type 2 diabetes mellitus   . GERD (gastroesophageal reflux disease)   . Hyperlipidemia   . Hypertension   . Kidney stones     a. Remotely.  Marland Kitchen Hx of rheumatic fever     a. As a child.  Marland Kitchen PAF (paroxysmal atrial fibrillation)     a. Dx 09/2012, chronicity unclear at that time. Initially refused Xarelto due to risk of bleeding. b. Sustained CVA 12/2012 - agreeable to taking Eliquis. Tachybrady syndrome during that admission s/p Medtronic PPM implantation;  c. Failed flecainide, sotalol, amiodarone (itching), and propafenone.  . Obesity   . CVA (cerebral vascular accident)     a. Thromboemoblic L MCA CVA 81/1914 with no residual deficit except mild occasional word finding. b. 7-82% RICA/LICA by dopplers 95/6/21.  . Tachy-brady syndrome     a. AF RVR with pauses >5sec requiring Medtronic PPM 12/2012.  . H/O medication noncompliance     a. Per PCP note 01/06/13: Previously refused anything greater than aspirin, refused treatment for DM, and often stopped hypertensive meds and lipid meds.  . Chronic combined systolic and diastolic CHF (congestive heart failure)     a. Felt related to AF. EF 45-50% by echo 12/2012.  Marland Kitchen Acid reflux disease      Past Surgical History  Procedure Laterality Date  . Cholecystectomy    . Tubal ligation      ectopic preganacy   . Destruction trigeminal nerve via neurolytic agent  NOv @013     Gamma Knife   . Pacemaker insertion  12-30-12    MDT dual chamber pacemaker implanted by Dr Caryl Comes for tachy-brady syndrome  . Insert / replace / remove pacemaker    . Permanent pacemaker insertion N/A 12/30/2012    Procedure: PERMANENT PACEMAKER INSERTION;  Surgeon: Deboraha Sprang, MD;  Location: [facility] CATH LAB;  Service: Cardiovascular;  Laterality: N/A;    Current Outpatient Prescriptions  Medication Sig Dispense Refill  . apixaban (ELIQUIS) 5 MG TABS tablet Take 1 tablet (5 mg total) by mouth 2 (two) times daily. 60 tablet 6  . losartan (COZAAR) 100 MG tablet take 1 tablet by mouth once daily 30 tablet 5  . metFORMIN (GLUCOPHAGE-XR) 500 MG 24 hr tablet take 1 tablet by mouth once daily WITH BREAKFAST AS DIRECTED 90 tablet 1  . metoprolol (LOPRESSOR) 100 MG tablet Take 1 tablet (100 mg total) by mouth 2 (two) times daily. 60 tablet 6  . mometasone (NASONEX) 50 MCG/ACT nasal spray Place 2 sprays into the nose daily. (Patient taking differently: Place 2 sprays into the nose as needed. ) 17 g 12  . pantoprazole (PROTONIX) 40 MG tablet Take 40 mg by mouth daily  as needed (for acid reflux).    . pravastatin (PRAVACHOL) 80 MG tablet take 1 tablet by mouth once daily 30 tablet 4  . tiZANidine (ZANAFLEX) 4 MG tablet Take 1 tablet (4 mg total) by mouth at bedtime. (Patient taking differently: Take 4 mg by mouth as needed. ) 30 tablet 3   No current facility-administered medications for this visit.    Allergies  Allergen Reactions  . Amiodarone Itching  . Crestor [Rosuvastatin]     myalgia  . Fish Allergy Swelling    Shell fish  . Iodine Itching    itching  . Lipitor [Atorvastatin]     myalgias  . Penicillins Hives and Itching  . Ace Inhibitors Cough    Review of Systems negative except from HPI and  PMH  Physical Exam BP 168/89 mmHg  Pulse 80  Ht 5\' 3"  (1.6 m)  Wt 195 lb 8 oz (88.678 kg)  BMI 34.64 kg/m2 Well developed and well nourished in no acute distress HENT normal E scleral and icterus clear Neck Supple JVP flat; carotids brisk and full Clear to ausculation Device pocket well healed; without hematoma or erythema.  There is no tethering Regular rate and rhythm, no murmurs gallops or rub Soft with active bowel sounds No clubbing cyanosis  Edema Alert and oriented, grossly normal motor and sensory function Skin Warm and Dry  ECG demonstrates sinus rhythm at 80 Intervals 16/09/40 Otherwise normal  Assessment and  Plan  Atrial fibrillation-paroxysmal status post PVI [facility] for/15  Hypertension  Diabetes  HFpEF  Sleep disordered breathing  Her atrial fibrillation is markedly improved following pulmonary vein isolation. The decision which is consistent with guidelines is to continue her on her anticoagulation. She is comfortable with that.  She is going to continue to work on her exercises he she can reduce her weight.  She is willing to undergo sleep study and I will forward this note to Dr. Edilia Bo to see if he can't arrange that for her.  As relates to her blood pressure which is poorly controlled, we will change her antihypertensive therapy By adding labetalol 200 twice daily and stopping her metoprolol.

## 2014-03-24 NOTE — Patient Instructions (Addendum)
Your physician has recommended you make the following change in your medication:  Stop Metoprolol Tartrate. Start Labetolol 200 mg Take 1 tablet by mouth twice daily.  Your physician wants you to follow-up in: 6 months with Dr. Caryl Comes. You will receive a reminder letter in the mail two months in advance. If you don't receive a letter, please call our office to schedule the follow-up appointment.  Remote monitoring is used to monitor your Pacemaker of ICD from home. This monitoring reduces the number of office visits required to check your device to one time per year. It allows [name] to keep an eye on the functioning of your device to ensure it is working properly. You are scheduled for a device check from home on 06/23/14. You may send your transmission at any time that day. If you have a wireless device, the transmission will be sent automatically. After your physician reviews your transmission, you will receive a postcard with your next transmission date.

## 2014-03-25 ENCOUNTER — Other Ambulatory Visit: Payer: Self-pay | Admitting: Family Medicine

## 2014-03-25 DIAGNOSIS — G4733 Obstructive sleep apnea (adult) (pediatric): Secondary | ICD-10-CM

## 2014-03-25 DIAGNOSIS — E669 Obesity, unspecified: Secondary | ICD-10-CM

## 2014-03-25 DIAGNOSIS — I4891 Unspecified atrial fibrillation: Secondary | ICD-10-CM

## 2014-03-27 ENCOUNTER — Encounter: Payer: Self-pay | Admitting: Gastroenterology

## 2014-04-01 ENCOUNTER — Telehealth: Payer: Self-pay | Admitting: *Deleted

## 2014-04-01 NOTE — Telephone Encounter (Signed)
Pt calling stating that the new medication it is giving her a reaction She is stop taking it.  Her eye could not focus, talked to pharmacy and they told her to call us.  Took it for a few days and now she is going to stop it Needs to know what to do. Her bp is doing better but only when on medication.  But can't seem to take it for she can't see straight Labetalol  Is the medication that is giving her these problems.  Please advise.

## 2014-04-07 NOTE — Telephone Encounter (Signed)
Can followup with PCP re medication for BP Thanks

## 2014-04-16 ENCOUNTER — Other Ambulatory Visit: Payer: Self-pay

## 2014-04-16 MED ORDER — PRAVASTATIN SODIUM 80 MG PO TABS: 80.0000 mg | ORAL_TABLET | Freq: Every day | ORAL | Status: DC

## 2014-04-16 NOTE — Telephone Encounter (Signed)
Refill pravastatin 40mg

## 2014-04-17 ENCOUNTER — Other Ambulatory Visit: Payer: Self-pay | Admitting: Family Medicine

## 2014-05-04 ENCOUNTER — Ambulatory Visit (INDEPENDENT_AMBULATORY_CARE_PROVIDER_SITE_OTHER): Payer: Medicare Other | Admitting: Family Medicine

## 2014-05-04 ENCOUNTER — Encounter: Payer: Self-pay | Admitting: Family Medicine

## 2014-05-04 ENCOUNTER — Other Ambulatory Visit: Payer: Self-pay | Admitting: Family Medicine

## 2014-05-04 VITALS — BP 150/84 | HR 85 | Temp 98.7°F | Ht 63.0 in | Wt 195.5 lb

## 2014-05-04 DIAGNOSIS — I639 Cerebral infarction, unspecified: Secondary | ICD-10-CM

## 2014-05-04 DIAGNOSIS — I10 Essential (primary) hypertension: Secondary | ICD-10-CM

## 2014-05-04 DIAGNOSIS — E1159 Type 2 diabetes mellitus with other circulatory complications: Secondary | ICD-10-CM

## 2014-05-04 DIAGNOSIS — E1151 Type 2 diabetes mellitus with diabetic peripheral angiopathy without gangrene: Secondary | ICD-10-CM

## 2014-05-04 LAB — BASIC METABOLIC PANEL
BUN: 13 mg/dL (ref 6–23)
CO2: 29 mEq/L (ref 19–32)
Calcium: 9.6 mg/dL (ref 8.4–10.5)
Chloride: 104 mEq/L (ref 96–112)
Creatinine, Ser: 0.77 mg/dL (ref 0.40–1.20)
GFR: 79.05 mL/min (ref >60.00)
Glucose, Bld: 166 mg/dL — ABNORMAL HIGH (ref 70–99)
Potassium: 4.1 mEq/L (ref 3.5–5.1)
SODIUM: 137 meq/L (ref 135–145)

## 2014-05-04 LAB — HEMOGLOBIN A1C: Hgb A1c MFr Bld: 7.4 % — ABNORMAL HIGH (ref 4.6–6.5)

## 2014-05-04 MED ORDER — GLIPIZIDE ER 5 MG PO TB24: 5.0000 mg | ORAL_TABLET | Freq: Every day | ORAL | Status: DC

## 2014-05-04 NOTE — Progress Notes (Signed)
Pre visit review using our clinic review tool, if applicable. No additional management support is needed unless otherwise documented below in the visit note. 

## 2014-05-04 NOTE — Progress Notes (Signed)
Dr. Frederico Hamman T. Detroit Frieden, MD, [facility] [place], [address] Phone: [phone] Fax: [phone]  05/04/2014  Patient: Nina Carpenter, MRN: [id number], DOB: 07-20-1945, 69 y.o.  Primary Physician:  [name], MD  Chief Complaint: Follow-up  Subjective:   Nina Carpenter is a 69 y.o. very pleasant female patient who presents with the following:  DM Diarrhea associated with metformin. Diabetes Mellitus: Tolerating Medications: no Compliance with diet: fair Exercise: minimal / intermittent Avg blood sugars at home: not checking Foot problems: none Hypoglycemia: none No nausea, vomitting, blurred vision, polyuria.  Lab Results  Component Value Date   HGBA1C 7.6* 10/06/2013   HGBA1C 7.7* 04/07/2013   HGBA1C 7.9* 12/29/2012   Lab Results  Component Value Date   MICROALBUR 2.6* 11/07/2011   LDLCALC 115* 10/06/2013   CREATININE 1.0 10/06/2013    Wt Readings from Last 3 Encounters:  05/04/14 195 lb 8 oz (88.678 kg)  03/24/14 195 lb 8 oz (88.678 kg)  12/30/13 192 lb (87.091 kg)    Body mass index is 34.64 kg/(m^2).   HTN: elevated. Losartan, metoprol 100 mg bid. HTN: Tolerating all medications without side effects - stopped labetolol Stable and at goal No CP, no sob. No HA.  BP Readings from Last 3 Encounters:  05/04/14 150/84  03/24/14 168/89  12/30/13 626/94    Basic Metabolic Panel:    Component Value Date/Time   NA 138 10/06/2013 0907   NA 141 03/31/2013 0918   K 4.5 10/06/2013 0907   CL 104 10/06/2013 0907   CO2 26 10/06/2013 0907   BUN 18 10/06/2013 0907   BUN 19 03/31/2013 0918   CREATININE 1.0 10/06/2013 0907   GLUCOSE 162* 10/06/2013 0907   GLUCOSE 181* 03/31/2013 0918   CALCIUM 9.3 10/06/2013 0907      Immunization History  Administered Date(s) Administered  . Influenza Split 11/13/2011  . Influenza,inj,Quad PF,36+ Mos 11/18/2012, 10/06/2013  . Pneumococcal  Conjugate-13 10/06/2013  . Pneumococcal Polysaccharide-23 10/11/2010    Lipids: Doing well, stable. Tolerating meds fine with no SE. Panel reviewed with patient.  Lipids:    Component Value Date/Time   CHOL 199 10/06/2013 0907   TRIG 192.0* 10/06/2013 0907   HDL 46.00 10/06/2013 0907   LDLDIRECT 178.0 11/18/2012 1049   VLDL 38.4 10/06/2013 0907   CHOLHDL 4 10/06/2013 0907    Lab Results  Component Value Date   ALT 27 10/06/2013   AST 22 10/06/2013   ALKPHOS 72 10/06/2013   BILITOT 0.6 10/06/2013     Past Medical History, Surgical History, Social History, Family History, Problem List, Medications, and Allergies have been reviewed and updated if relevant.   GEN: No acute illnesses, no fevers, chills. GI: No n/v/d, eating normally Pulm: No SOB Interactive and getting along well at home.  Otherwise, ROS is as per the HPI.  Objective:   BP 150/84 mmHg  Pulse 85  Temp(Src) 98.7 F (37.1 C) (Oral)  Ht 5\' 3"  (1.6 m)  Wt 195 lb 8 oz (88.678 kg)  BMI 34.64 kg/m2  GEN: WDWN, NAD, Non-toxic, A & O x 3 HEENT: Atraumatic, Normocephalic. Neck supple. No masses, No LAD. Ears and Nose: No external deformity. CV: RRR, No M/G/R. No JVD. No thrill. No extra heart sounds. PULM: CTA B, no wheezes, crackles, rhonchi. No retractions. No resp. distress. No accessory muscle use. EXTR: No c/c/e NEURO Normal gait.  PSYCH: Normally interactive. Conversant. Not depressed or anxious appearing.  Calm demeanor.   Laboratory and Imaging Data:  Assessment and Plan:   Type 2 diabetes mellitus with vascular disease - Plan: Basic metabolic panel, Hemoglobin A1c  CVA (cerebral vascular accident) - Plan: Basic metabolic panel, Hemoglobin A1c  Essential hypertension  Diarrhea, d/c metformin and start glipizide  Very resistant to increasing BP meds, she will take bid and send records to me.  Compliant with all other meds.   Follow-up: Return in about 6 months (around 11/03/2014).  New  Prescriptions   GLIPIZIDE (GLUCOTROL XL) 5 MG 24 HR TABLET    Take 1 tablet (5 mg total) by mouth daily with breakfast.   Orders Placed This Encounter  Procedures  . Basic metabolic panel  . Hemoglobin A1c    Signed,  Deborh Pense T. Bracy Pepper, MD   Patient's Medications  New Prescriptions   GLIPIZIDE (GLUCOTROL XL) 5 MG 24 HR TABLET    Take 1 tablet (5 mg total) by mouth daily with breakfast.  Previous Medications   APIXABAN (ELIQUIS) 5 MG TABS TABLET    Take 1 tablet (5 mg total) by mouth 2 (two) times daily.   LOSARTAN (COZAAR) 100 MG TABLET    take 1 tablet by mouth once daily   METOPROLOL (LOPRESSOR) 100 MG TABLET       MOMETASONE (NASONEX) 50 MCG/ACT NASAL SPRAY    Place 2 sprays into the nose daily.   PANTOPRAZOLE (PROTONIX) 40 MG TABLET    Take 40 mg by mouth daily as needed (for acid reflux).   PRAVASTATIN (PRAVACHOL) 80 MG TABLET    Take 1 tablet (80 mg total) by mouth daily.   TIZANIDINE (ZANAFLEX) 4 MG TABLET    Take 1 tablet (4 mg total) by mouth at bedtime.  Modified Medications   No medications on file  Discontinued Medications   LABETALOL (NORMODYNE) 200 MG TABLET    Take 1 tablet (200 mg total) by mouth 2 (two) times daily.   METFORMIN (GLUCOPHAGE-XR) 500 MG 24 HR TABLET    take 1 tablet by mouth once daily WITH BREAKFAST AS DIRECTED

## 2014-05-05 ENCOUNTER — Encounter: Payer: Self-pay | Admitting: Family Medicine

## 2014-05-25 ENCOUNTER — Encounter: Payer: Self-pay | Admitting: Pulmonary Disease

## 2014-05-25 ENCOUNTER — Ambulatory Visit (INDEPENDENT_AMBULATORY_CARE_PROVIDER_SITE_OTHER): Payer: Medicare Other | Admitting: Pulmonary Disease

## 2014-05-25 VITALS — BP 150/72 | HR 67 | Ht 63.0 in | Wt 191.8 lb

## 2014-05-25 DIAGNOSIS — G4733 Obstructive sleep apnea (adult) (pediatric): Secondary | ICD-10-CM | POA: Insufficient documentation

## 2014-05-25 DIAGNOSIS — I639 Cerebral infarction, unspecified: Secondary | ICD-10-CM | POA: Diagnosis not present

## 2014-05-25 NOTE — Progress Notes (Signed)
Subjective:    Patient ID: Nina Carpenter, female    DOB: 20-Mar-1945, 69 y.o.   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 She developed atrial fibrillation and underwent successful ablation at [facility]. She is maintained on anti-coagulation and rate control beta blocker. She has a history of CVA with no residuals. Loud snoring has been noted by family members. She denies excessive daytime somnolence. Epworth sleepiness score is 3. Bedtime is between 11 PM to midnight, sleep latency is minimal, she sleeps on her side with one pillow, reports 1 bathroom visit, and is out of bed by 8 AM feeling rested, without headaches or dryness of mouth. Her weight is unchanged over the last 2 years. There is no history suggestive of cataplexy, sleep paralysis or parasomnias     Past Medical History  Diagnosis Date  . Allergy   . Asthma     a. Remotely.  . Type 2 diabetes mellitus   . GERD (gastroesophageal reflux disease)   . Hyperlipidemia   . Hypertension   . Kidney stones     a. Remotely.  Marland Kitchen Hx of rheumatic fever     a. As a child.  Marland Kitchen PAF (paroxysmal atrial fibrillation)     a. Dx 09/2012, chronicity unclear at that time. Initially refused Xarelto due to risk of bleeding. b. Sustained CVA 12/2012 - agreeable to taking Eliquis. Tachybrady syndrome during that admission s/p Medtronic PPM implantation;  c. Failed flecainide, sotalol, amiodarone (itching), and propafenone.  . Obesity   . CVA (cerebral vascular accident)     a. Thromboemoblic L MCA CVA 66/0630 with no residual deficit except mild occasional word finding. b. 1-60% RICA/LICA by dopplers 11/01/30.  . Tachy-brady syndrome     a. AF RVR with pauses >5sec requiring Medtronic PPM 12/2012.  . H/O medication noncompliance     a. Per PCP note 01/06/13: Previously refused anything greater than aspirin, refused treatment for DM, and often stopped  hypertensive meds and lipid meds.  . Chronic combined systolic and diastolic CHF (congestive heart failure)     a. Felt related to AF. EF 45-50% by echo 12/2012.  Marland Kitchen Acid reflux disease     Past Surgical History  Procedure Laterality Date  . Cholecystectomy    . Tubal ligation      ectopic preganacy   . Destruction trigeminal nerve via neurolytic agent  NOv @013     Gamma Knife   . Pacemaker insertion  12-30-12    MDT dual chamber pacemaker implanted by Dr Caryl Comes for tachy-brady syndrome  . Insert / replace / remove pacemaker    . Permanent pacemaker insertion N/A 12/30/2012    Procedure: PERMANENT PACEMAKER INSERTION;  Surgeon: Deboraha Sprang, MD;  Location: [facility] CATH LAB;  Service: Cardiovascular;  Laterality: N/A;    Allergies  Allergen Reactions  . Amiodarone Itching  . Crestor [Rosuvastatin]     myalgia  . Fish Allergy Swelling    Shell fish  . Iodine Itching    itching  . Lipitor [Atorvastatin]     myalgias  . Penicillins Hives and Itching  . Ace Inhibitors Cough     Review of Systems  Constitutional: Negative for fever, chills and unexpected weight change.  HENT: Negative for congestion, dental problem, ear pain, nosebleeds, postnasal drip, rhinorrhea, sinus pressure, sneezing, sore throat, trouble swallowing and voice change.   Eyes: Negative for visual disturbance.  Respiratory: Negative for cough, choking and shortness  of breath.   Cardiovascular: Negative for chest pain and leg swelling.  Gastrointestinal: Negative for vomiting, abdominal pain and diarrhea.  Genitourinary: Negative for difficulty urinating.  Musculoskeletal: Negative for arthralgias.  Skin: Negative for rash.  Neurological: Negative for tremors, syncope and headaches.  Hematological: Does not bruise/bleed easily.       Objective:   Physical Exam  Gen. Pleasant, obese, in no distress, normal affect ENT - no lesions, no post nasal drip, class 2-3 airway Neck: No JVD, no thyromegaly, no  carotid bruits Lungs: no use of accessory muscles, no dullness to percussion, decreased without rales or rhonchi  Cardiovascular: Rhythm regular, heart sounds  normal, no murmurs or gallops, no peripheral edema Abdomen: soft and non-tender, no hepatosplenomegaly, BS normal. Musculoskeletal: No deformities, no cyanosis or clubbing Neuro:  alert, non focal, no tremors       Assessment & Plan:

## 2014-05-25 NOTE — Assessment & Plan Note (Addendum)
Given excessive daytime somnolence, narrow pharyngeal exam, witnessed apneas & loud snoring, obstructive sleep apnea is very likely & an overnight polysomnogram will be scheduled as a home study. The pathophysiology of obstructive sleep apnea , it's cardiovascular consequences & modes of treatment including CPAP were discused with the patient in detail & they evidenced understanding. Since no significant comorbidities, we can proceed with a home study

## 2014-05-25 NOTE — Patient Instructions (Signed)
Home sleep study will be scheduled 

## 2014-06-01 ENCOUNTER — Encounter: Payer: Self-pay | Admitting: Internal Medicine

## 2014-06-23 ENCOUNTER — Ambulatory Visit (INDEPENDENT_AMBULATORY_CARE_PROVIDER_SITE_OTHER): Payer: Medicare Other | Admitting: *Deleted

## 2014-06-23 DIAGNOSIS — I495 Sick sinus syndrome: Secondary | ICD-10-CM

## 2014-06-24 NOTE — Progress Notes (Signed)
Remote pacemaker transmission.   

## 2014-06-25 ENCOUNTER — Telehealth: Payer: Self-pay | Admitting: *Deleted

## 2014-06-25 NOTE — Telephone Encounter (Signed)
.  left message to have patient return my call, about scheduling mammogram.

## 2014-06-28 LAB — CUP PACEART REMOTE DEVICE CHECK
Battery Voltage: 3.01 V
Brady Statistic AS VS Percent: 52.75 %
Brady Statistic RV Percent Paced: 0.13 %
Date Time Interrogation Session: 20160531144324
Lead Channel Impedance Value: 361 Ohm
Lead Channel Impedance Value: 437 Ohm
Lead Channel Pacing Threshold Amplitude: 0.5 V
Lead Channel Pacing Threshold Amplitude: 0.5 V
Lead Channel Pacing Threshold Pulse Width: 0.4 ms
Lead Channel Pacing Threshold Pulse Width: 0.4 ms
Lead Channel Setting Pacing Amplitude: 2 V
Lead Channel Setting Sensing Sensitivity: 2 mV
MDC IDC MSMT BATTERY REMAINING LONGEVITY: 97 mo
MDC IDC MSMT LEADCHNL RA IMPEDANCE VALUE: 399 Ohm
MDC IDC MSMT LEADCHNL RA SENSING INTR AMPL: 1.5 mV
MDC IDC MSMT LEADCHNL RA SENSING INTR AMPL: 1.5 mV
MDC IDC MSMT LEADCHNL RV IMPEDANCE VALUE: 399 Ohm
MDC IDC MSMT LEADCHNL RV SENSING INTR AMPL: 5.5 mV
MDC IDC MSMT LEADCHNL RV SENSING INTR AMPL: 5.5 mV
MDC IDC SET LEADCHNL RV PACING AMPLITUDE: 2.5 V
MDC IDC SET LEADCHNL RV PACING PULSEWIDTH: 0.4 ms
MDC IDC STAT BRADY AP VP PERCENT: 0.11 %
MDC IDC STAT BRADY AP VS PERCENT: 47.12 %
MDC IDC STAT BRADY AS VP PERCENT: 0.02 %
MDC IDC STAT BRADY RA PERCENT PACED: 47.23 %
Zone Setting Detection Interval: 400 ms
Zone Setting Detection Interval: 400 ms

## 2014-06-29 DIAGNOSIS — G4730 Sleep apnea, unspecified: Secondary | ICD-10-CM

## 2014-07-08 ENCOUNTER — Telehealth: Payer: Self-pay | Admitting: Pulmonary Disease

## 2014-07-08 ENCOUNTER — Other Ambulatory Visit: Payer: Self-pay | Admitting: *Deleted

## 2014-07-08 DIAGNOSIS — G4733 Obstructive sleep apnea (adult) (pediatric): Secondary | ICD-10-CM

## 2014-07-08 NOTE — Telephone Encounter (Signed)
Moderate Sleep Apnea AHI 25/h Needs CPAP Titration study

## 2014-07-09 NOTE — Telephone Encounter (Signed)
lmtcb

## 2014-07-13 NOTE — Telephone Encounter (Signed)
lmtcb x2 

## 2014-07-15 ENCOUNTER — Encounter: Payer: Self-pay | Admitting: *Deleted

## 2014-07-15 ENCOUNTER — Encounter: Payer: Self-pay | Admitting: Cardiology

## 2014-07-15 NOTE — Telephone Encounter (Signed)
lmomtcb  

## 2014-07-15 NOTE — Telephone Encounter (Signed)
Unable to reach letter mailed to patient. Will await patient's call. Nothing further needed.

## 2014-07-21 ENCOUNTER — Telehealth: Payer: Self-pay | Admitting: Pulmonary Disease

## 2014-07-21 NOTE — Telephone Encounter (Signed)
LM to call. See phone note 6/15.

## 2014-07-22 ENCOUNTER — Encounter: Payer: Self-pay | Admitting: Internal Medicine

## 2014-07-22 NOTE — Telephone Encounter (Signed)
lmtcb for pt.     Glean Hess, CMA at 07/08/2014 2:31 PM     Status: Signed       Expand All Collapse All   Moderate Sleep Apnea AHI 25/h Needs CPAP Titration study

## 2014-07-23 NOTE — Telephone Encounter (Signed)
atc line busy wcb 

## 2014-07-24 NOTE — Telephone Encounter (Signed)
Pt aware of results.  CPAP Titration study entered.  Pt aware that someone will call and set up an appt  Nothing further needed.

## 2014-07-29 ENCOUNTER — Encounter: Payer: Self-pay | Admitting: Cardiology

## 2014-09-16 ENCOUNTER — Other Ambulatory Visit: Payer: Self-pay

## 2014-09-16 MED ORDER — PRAVASTATIN SODIUM 80 MG PO TABS: 80.0000 mg | ORAL_TABLET | Freq: Every day | ORAL | Status: DC

## 2014-09-16 NOTE — Telephone Encounter (Signed)
Pt going out of town for 2 weeks and request refill pravastatin to [facility] [address]. Pt seen 05/04/14. Advised pt done.

## 2014-09-18 ENCOUNTER — Ambulatory Visit (INDEPENDENT_AMBULATORY_CARE_PROVIDER_SITE_OTHER): Payer: Medicare Other | Admitting: *Deleted

## 2014-09-18 DIAGNOSIS — I495 Sick sinus syndrome: Secondary | ICD-10-CM | POA: Diagnosis not present

## 2014-09-23 NOTE — Progress Notes (Signed)
Remote pacemaker transmission.   

## 2014-10-01 LAB — CUP PACEART REMOTE DEVICE CHECK
Brady Statistic AP VP Percent: 0.07 %
Brady Statistic AS VP Percent: 0.03 %
Brady Statistic AS VS Percent: 50.2 %
Brady Statistic RA Percent Paced: 49.77 %
Brady Statistic RV Percent Paced: 0.1 %
Lead Channel Impedance Value: 361 Ohm
Lead Channel Impedance Value: 399 Ohm
Lead Channel Impedance Value: 399 Ohm
Lead Channel Impedance Value: 437 Ohm
Lead Channel Pacing Threshold Amplitude: 0.5 V
Lead Channel Pacing Threshold Pulse Width: 0.4 ms
Lead Channel Pacing Threshold Pulse Width: 0.4 ms
Lead Channel Sensing Intrinsic Amplitude: 4.5 mV
Lead Channel Setting Pacing Amplitude: 2 V
Lead Channel Setting Pacing Amplitude: 2.5 V
Lead Channel Setting Pacing Pulse Width: 0.4 ms
Lead Channel Setting Sensing Sensitivity: 2 mV
MDC IDC MSMT BATTERY REMAINING LONGEVITY: 95 mo
MDC IDC MSMT BATTERY VOLTAGE: 3.01 V
MDC IDC MSMT LEADCHNL RA PACING THRESHOLD AMPLITUDE: 0.625 V
MDC IDC MSMT LEADCHNL RA SENSING INTR AMPL: 3 mV
MDC IDC SESS DTM: 20160826124836
MDC IDC STAT BRADY AP VS PERCENT: 49.7 %
Zone Setting Detection Interval: 400 ms
Zone Setting Detection Interval: 400 ms

## 2014-10-02 ENCOUNTER — Encounter: Payer: Self-pay | Admitting: Cardiology

## 2014-10-05 ENCOUNTER — Other Ambulatory Visit: Payer: Self-pay

## 2014-10-05 MED ORDER — APIXABAN 5 MG PO TABS: 5.0000 mg | ORAL_TABLET | Freq: Two times a day (BID) | ORAL | Status: DC

## 2014-10-05 NOTE — Telephone Encounter (Signed)
Refill sent for Eliquis 5 mg  

## 2014-10-12 ENCOUNTER — Encounter: Payer: Self-pay | Admitting: Internal Medicine

## 2014-10-16 ENCOUNTER — Other Ambulatory Visit: Payer: Self-pay | Admitting: Family Medicine

## 2014-10-19 ENCOUNTER — Encounter: Payer: Self-pay | Admitting: Cardiology

## 2014-11-01 ENCOUNTER — Other Ambulatory Visit: Payer: Self-pay | Admitting: Family Medicine

## 2014-11-30 ENCOUNTER — Telehealth: Payer: Self-pay | Admitting: Family Medicine

## 2014-12-01 NOTE — Telephone Encounter (Signed)
Patient called back and scheduled Medicare Wellness exam on 03/17/15 and lab on 03/10/15.

## 2014-12-01 NOTE — Telephone Encounter (Signed)
Please call and schedule follow up appointment with Dr. Lorelei Pont sometime in the next couple of months.

## 2014-12-01 NOTE — Telephone Encounter (Signed)
60, 1 ref  F/u with me in the next couple of months

## 2014-12-01 NOTE — Telephone Encounter (Signed)
Spoke to pt to schedule Medicare Wellness visit.  She will call back, now was not a convenient time to talk. / lt

## 2014-12-01 NOTE — Telephone Encounter (Signed)
Last office visit 05/04/2014.  States to f/u in 10/2104.  No future appointments scheduled.  Refill?

## 2014-12-09 ENCOUNTER — Encounter: Payer: Self-pay | Admitting: Internal Medicine

## 2014-12-14 ENCOUNTER — Ambulatory Visit (INDEPENDENT_AMBULATORY_CARE_PROVIDER_SITE_OTHER): Payer: Medicare Other | Admitting: Internal Medicine

## 2014-12-14 ENCOUNTER — Encounter: Payer: Self-pay | Admitting: Internal Medicine

## 2014-12-14 VITALS — BP 146/88 | HR 77 | Temp 98.3°F | Wt 204.0 lb

## 2014-12-14 DIAGNOSIS — J309 Allergic rhinitis, unspecified: Secondary | ICD-10-CM

## 2014-12-14 DIAGNOSIS — I639 Cerebral infarction, unspecified: Secondary | ICD-10-CM | POA: Diagnosis not present

## 2014-12-14 MED ORDER — METHYLPREDNISOLONE ACETATE 80 MG/ML IJ SUSP
80.0000 mg | Freq: Once | INTRAMUSCULAR | Status: AC
  Administered 2014-12-14: 80 mg via INTRAMUSCULAR

## 2014-12-14 MED ORDER — MOMETASONE FUROATE 50 MCG/ACT NA SUSP: 2.0000 | Freq: Every day | NASAL | Status: DC

## 2014-12-14 MED ORDER — HYDROCOD POLST-CPM POLST ER 10-8 MG/5ML PO SUER: 5.0000 mL | Freq: Every evening | ORAL | Status: DC | PRN

## 2014-12-14 MED ORDER — MONTELUKAST SODIUM 10 MG PO TABS: 10.0000 mg | ORAL_TABLET | Freq: Every day | ORAL | Status: DC

## 2014-12-14 NOTE — Progress Notes (Signed)
Pre visit review using our clinic review tool, if applicable. No additional management support is needed unless otherwise documented below in the visit note. 

## 2014-12-14 NOTE — Progress Notes (Signed)
HPI  Pt presents to the clinic today with c/o nasal congestion, cough and chest congestion. This started 1 week ago. She is not blowing anything out of her nose. The cough is productive of clear mucous. She denies shortness of breath but reports she has had some wheezing. She denies fever, chills or body aches. She has tried Mucinex with minimal relief. She has a history of allergy induced asthma but reports she has been out of her Nasonex and Singulair for some time. She has not had sick contacts. She has not had her flu shot this year yet.  Review of Systems      Past Medical History  Diagnosis Date  . Allergy   . Asthma     a. Remotely.  . Type 2 diabetes mellitus ([place])   . GERD (gastroesophageal reflux disease)   . Hyperlipidemia   . Hypertension   . Kidney stones     a. Remotely.  Marland Kitchen Hx of rheumatic fever     a. As a child.  Marland Kitchen PAF (paroxysmal atrial fibrillation) ([place])     a. Dx 09/2012, chronicity unclear at that time. Initially refused Xarelto due to risk of bleeding. b. Sustained CVA 12/2012 - agreeable to taking Eliquis. Tachybrady syndrome during that admission s/p Medtronic PPM implantation;  c. Failed flecainide, sotalol, amiodarone (itching), and propafenone.  . Obesity   . CVA (cerebral vascular accident) ([place])     a. Thromboemoblic L MCA CVA 123456 with no residual deficit except mild occasional word finding. b. 123456 RICA/LICA by dopplers [id number].  . Tachy-brady syndrome ([place])     a. AF RVR with pauses >5sec requiring Medtronic PPM 12/2012.  . H/O medication noncompliance     a. Per PCP note 01/06/13: Previously refused anything greater than aspirin, refused treatment for DM, and often stopped hypertensive meds and lipid meds.  . Chronic combined systolic and diastolic CHF (congestive heart failure) ([place])     a. Felt related to AF. EF 45-50% by echo 12/2012.  Marland Kitchen Acid reflux disease     Family History  Problem Relation Age of Onset  . Stroke Mother   . Diabetes  Mother     Social History   Social History  . Marital Status: Married    Spouse Name: N/A  . Number of Children: 2  . Years of Education: N/A   Occupational History  . cosmetologist    Social History Main Topics  . Smoking status: Former Smoker -- 2.00 packs/day    Types: Cigarettes    Quit date: 01/24/1968  . Smokeless tobacco: Never Used  . Alcohol Use: 0.6 oz/week    1 Glasses of wine per week     Comment: rare use  . Drug Use: No  . Sexual Activity: Yes   Other Topics Concern  . Not on file   Social History Narrative   Lives in [place] with her husband.  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    Allergies  Allergen Reactions  . Amiodarone Itching  . Crestor [Rosuvastatin]     myalgia  . Fish Allergy Swelling    Shell fish  . Iodine Itching    itching  . Lipitor [Atorvastatin]     myalgias  . Penicillins Hives and Itching  . Ace Inhibitors Cough     Constitutional:  Denies headache, fatigue, fever or abrupt weight changes.  HEENT:  Positive nasal congestion and sore throat. Denies eye redness, eye pain, pressure behind the eyes, facial pain, nasal  congestion, ear pain, ringing in the ears, wax buildup, runny nose or bloody nose. Respiratory: Positive cough. Denies difficulty breathing or shortness of breath.  Cardiovascular: Denies chest pain, chest tightness, palpitations or swelling in the hands or feet.   No other specific complaints in a complete review of systems (except as listed in HPI above).  Objective:   BP 146/88 mmHg  Pulse 77  Temp(Src) 98.3 F (36.8 C) (Oral)  Wt 204 lb (92.534 kg)  SpO2 97% Wt Readings from Last 3 Encounters:  12/14/14 204 lb (92.534 kg)  05/25/14 191 lb 12.8 oz (87 kg)  05/04/14 195 lb 8 oz (88.678 kg)     General: Appears her stated age, in NAD. HEENT: Head: normal shape and size, no sinus tenderness noted; Eyes: sclera white, no icterus, conjunctiva pink; Ears: Tm's gray and intact, normal light  reflex; Nose: mucosa pink and moist, septum midline; Throat/Mouth: + PND. Teeth present, mucosa erythematous and moist, no exudate noted, no lesions or ulcerations noted.  Neck: No cervical lymphadenopathy.  Cardiovascular: Normal rate and rhythm. S1,S2 noted.  No murmur, rubs or gallops noted.  Pulmonary/Chest: Normal effort and positive vesicular breath sounds. No respiratory distress. No wheezes, rales or ronchi noted.      Assessment & Plan:   Allergic Rhinitis:  Get some rest and drink plenty of water Do salt water gargles for the sore throat 80 mg Depo IM today Refilled Nasonex and Singulair Rx for Hycodan cough syrup  RTC as needed or if symptoms persist.

## 2014-12-14 NOTE — Addendum Note (Signed)
Addended by: Lurlean Nanny on: 12/14/2014 05:07 PM   Modules accepted: Orders

## 2014-12-14 NOTE — Patient Instructions (Signed)
Allergic Rhinitis Allergic rhinitis is when the mucous membranes in the nose respond to allergens. Allergens are particles in the air that cause your body to have an allergic reaction. This causes you to release allergic antibodies. Through a chain of events, these eventually cause you to release histamine into the blood stream. Although meant to protect the body, it is this release of histamine that causes your discomfort, such as frequent sneezing, congestion, and an itchy, runny nose.  CAUSES Seasonal allergic rhinitis (hay fever) is caused by pollen allergens that may come from grasses, trees, and weeds. Year-round allergic rhinitis (perennial allergic rhinitis) is caused by allergens such as house dust mites, pet dander, and mold spores. SYMPTOMS  Nasal stuffiness (congestion).  Itchy, runny nose with sneezing and tearing of the eyes. DIAGNOSIS Your health care provider can help you determine the allergen or allergens that trigger your symptoms. If you and your health care provider are unable to determine the allergen, skin or blood testing may be used. Your health care provider will diagnose your condition after taking your health history and performing a physical exam. Your health care provider may assess you for other related conditions, such as asthma, pink eye, or an ear infection. TREATMENT Allergic rhinitis does not have a cure, but it can be controlled by:  Medicines that block allergy symptoms. These may include allergy shots, nasal sprays, and oral antihistamines.  Avoiding the allergen. Hay fever may often be treated with antihistamines in pill or nasal spray forms. Antihistamines block the effects of histamine. There are over-the-counter medicines that may help with nasal congestion and swelling around the eyes. Check with your health care provider before taking or giving this medicine. If avoiding the allergen or the medicine prescribed do not work, there are many new medicines  your health care provider can prescribe. Stronger medicine may be used if initial measures are ineffective. Desensitizing injections can be used if medicine and avoidance does not work. Desensitization is when a patient is given ongoing shots until the body becomes less sensitive to the allergen. Make sure you follow up with your health care provider if problems continue. HOME CARE INSTRUCTIONS It is not possible to completely avoid allergens, but you can reduce your symptoms by taking steps to limit your exposure to them. It helps to know exactly what you are allergic to so that you can avoid your specific triggers. SEEK MEDICAL CARE IF:  You have a fever.  You develop a cough that does not stop easily (persistent).  You have shortness of breath.  You start wheezing.  Symptoms interfere with normal daily activities.   This information is not intended to replace advice given to you by your health care provider. Make sure you discuss any questions you have with your health care provider.   Document Released: 10/04/2000 Document Revised: 01/30/2014 Document Reviewed: 09/16/2012 Elsevier Interactive Patient Education 2016 [employer]  

## 2014-12-21 ENCOUNTER — Ambulatory Visit (INDEPENDENT_AMBULATORY_CARE_PROVIDER_SITE_OTHER): Payer: Medicare Other | Admitting: *Deleted

## 2014-12-21 ENCOUNTER — Telehealth: Payer: Self-pay | Admitting: Cardiology

## 2014-12-21 DIAGNOSIS — I495 Sick sinus syndrome: Secondary | ICD-10-CM | POA: Diagnosis not present

## 2014-12-21 NOTE — Telephone Encounter (Signed)
Spoke with pt and reminded pt of remote transmission that is due today. Pt verbalized understanding.   

## 2014-12-23 ENCOUNTER — Ambulatory Visit (INDEPENDENT_AMBULATORY_CARE_PROVIDER_SITE_OTHER): Payer: Medicare Other

## 2014-12-23 DIAGNOSIS — Z23 Encounter for immunization: Secondary | ICD-10-CM

## 2014-12-23 NOTE — Progress Notes (Signed)
Remote pacemaker transmission.   

## 2014-12-31 LAB — CUP PACEART REMOTE DEVICE CHECK
Battery Remaining Longevity: 93 mo
Battery Voltage: 3.01 V
Brady Statistic AS VP Percent: 0.03 %
Brady Statistic AS VS Percent: 53.23 %
Implantable Lead Implant Date: 20141208
Implantable Lead Location: 753859
Lead Channel Impedance Value: 399 Ohm
Lead Channel Impedance Value: 418 Ohm
Lead Channel Pacing Threshold Amplitude: 0.375 V
Lead Channel Pacing Threshold Amplitude: 0.5 V
Lead Channel Pacing Threshold Pulse Width: 0.4 ms
Lead Channel Sensing Intrinsic Amplitude: 1.125 mV
Lead Channel Sensing Intrinsic Amplitude: 1.125 mV
MDC IDC LEAD IMPLANT DT: 20141208
MDC IDC LEAD LOCATION: 753860
MDC IDC MSMT LEADCHNL RA PACING THRESHOLD PULSEWIDTH: 0.4 ms
MDC IDC MSMT LEADCHNL RV IMPEDANCE VALUE: 437 Ohm
MDC IDC MSMT LEADCHNL RV IMPEDANCE VALUE: 475 Ohm
MDC IDC MSMT LEADCHNL RV SENSING INTR AMPL: 7.25 mV
MDC IDC MSMT LEADCHNL RV SENSING INTR AMPL: 7.25 mV
MDC IDC SESS DTM: 20161128202057
MDC IDC SET LEADCHNL RA PACING AMPLITUDE: 2 V
MDC IDC SET LEADCHNL RV PACING AMPLITUDE: 2.5 V
MDC IDC SET LEADCHNL RV PACING PULSEWIDTH: 0.4 ms
MDC IDC SET LEADCHNL RV SENSING SENSITIVITY: 2 mV
MDC IDC STAT BRADY AP VP PERCENT: 0.12 %
MDC IDC STAT BRADY AP VS PERCENT: 46.62 %
MDC IDC STAT BRADY RA PERCENT PACED: 46.74 %
MDC IDC STAT BRADY RV PERCENT PACED: 0.15 %

## 2015-01-01 ENCOUNTER — Encounter: Payer: Self-pay | Admitting: Cardiology

## 2015-01-12 ENCOUNTER — Ambulatory Visit (INDEPENDENT_AMBULATORY_CARE_PROVIDER_SITE_OTHER): Payer: Medicare Other | Admitting: Internal Medicine

## 2015-01-12 ENCOUNTER — Encounter: Payer: Self-pay | Admitting: Internal Medicine

## 2015-01-12 DIAGNOSIS — I639 Cerebral infarction, unspecified: Secondary | ICD-10-CM

## 2015-01-12 DIAGNOSIS — J069 Acute upper respiratory infection, unspecified: Secondary | ICD-10-CM

## 2015-01-12 MED ORDER — HYDROCOD POLST-CPM POLST ER 10-8 MG/5ML PO SUER: 5.0000 mL | Freq: Every evening | ORAL | Status: DC | PRN

## 2015-01-12 NOTE — Patient Instructions (Signed)

## 2015-01-12 NOTE — Progress Notes (Signed)
Pre visit review using our clinic review tool, if applicable. No additional management support is needed unless otherwise documented below in the visit note. 

## 2015-01-12 NOTE — Progress Notes (Signed)
HPI  Pt presents to the clinic today with c/o cough and chest congestion. This started 2-3 days ago. She reports she is blowing green mucous out of her nose. The cough is productive of green mucous. She does also have some itchy ears. She denies sore throat. She deneis fever, chills or body aches. She has tried Tussionex with good relief and reports she needs a refill today. She takes Singulair and Nasonex daily. She does have a history of allergies and asthma. She has not had sick contacts that she is aware of.  Review of Systems      Past Medical History  Diagnosis Date  . Allergy   . Asthma     a. Remotely.  . Type 2 diabetes mellitus ([place])   . GERD (gastroesophageal reflux disease)   . Hyperlipidemia   . Hypertension   . Kidney stones     a. Remotely.  Marland Kitchen Hx of rheumatic fever     a. As a child.  Marland Kitchen PAF (paroxysmal atrial fibrillation) ([place])     a. Dx 09/2012, chronicity unclear at that time. Initially refused Xarelto due to risk of bleeding. b. Sustained CVA 12/2012 - agreeable to taking Eliquis. Tachybrady syndrome during that admission s/p Medtronic PPM implantation;  c. Failed flecainide, sotalol, amiodarone (itching), and propafenone.  . Obesity   . CVA (cerebral vascular accident) ([place])     a. Thromboemoblic L MCA CVA 123456 with no residual deficit except mild occasional word finding. b. 123456 RICA/LICA by dopplers [id number].  . Tachy-brady syndrome ([place])     a. AF RVR with pauses >5sec requiring Medtronic PPM 12/2012.  . H/O medication noncompliance     a. Per PCP note 01/06/13: Previously refused anything greater than aspirin, refused treatment for DM, and often stopped hypertensive meds and lipid meds.  . Chronic combined systolic and diastolic CHF (congestive heart failure) ([place])     a. Felt related to AF. EF 45-50% by echo 12/2012.  Marland Kitchen Acid reflux disease     Family History  Problem Relation Age of Onset  . Stroke Mother   . Diabetes Mother     Social History    Social History  . Marital Status: Married    Spouse Name: N/A  . Number of Children: 2  . Years of Education: N/A   Occupational History  . cosmetologist    Social History Main Topics  . Smoking status: Former Smoker -- 2.00 packs/day    Types: Cigarettes    Quit date: 01/24/1968  . Smokeless tobacco: Never Used  . Alcohol Use: 0.6 oz/week    1 Glasses of wine per week     Comment: rare use  . Drug Use: No  . Sexual Activity: Yes   Other Topics Concern  . Not on file   Social History Narrative   Lives in [place] with her husband.  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    Allergies  Allergen Reactions  . Amiodarone Itching  . Crestor [Rosuvastatin]     myalgia  . Fish Allergy Swelling    Shell fish  . Iodine Itching    itching  . Lipitor [Atorvastatin]     myalgias  . Penicillins Hives and Itching  . Ace Inhibitors Cough     Constitutional: Denies headache, fatigue, fever or abrupt weight changes.  HEENT:  Positive runny nose. Denies eye redness, eye pain, pressure behind the eyes, facial pain, nasal congestion, ear pain, ringing in the ears, wax buildup, runny  nose or sore throat. Respiratory: Positive cough. Denies difficulty breathing or shortness of breath.  Cardiovascular: Denies chest pain, chest tightness, palpitations or swelling in the hands or feet.   No other specific complaints in a complete review of systems (except as listed in HPI above).  Objective:   BP 148/70 mmHg  Pulse 84  Temp(Src) 99.4 F (37.4 C) (Oral)  Wt 205 lb (92.987 kg)  Wt Readings from Last 3 Encounters:  01/12/15 205 lb (92.987 kg)  12/14/14 204 lb (92.534 kg)  05/25/14 191 lb 12.8 oz (87 kg)     General: Appears her stated age,  in NAD. HEENT: Head: normal shape and size, no sinus tenderness noted; Eyes: sclera white, no icterus, conjunctiva pink; Ears: Tm's gray and intact, normal light reflex; Nose: mucosa pink and moist, septum midline; Throat/Mouth: +  PND. Teeth present, mucosa erythematous and moist, no exudate noted, no lesions or ulcerations noted.  Neck: No cervical lymphadenopathy.  Cardiovascular: Normal rate and rhythm. S1,S2 noted.  No murmur, rubs or gallops noted.  Pulmonary/Chest: Normal effort and positive vesicular breath sounds. No respiratory distress. No wheezes, rales or ronchi noted.      Assessment & Plan:   Upper Respiratory Infection:  Viral Get some rest and drink plenty of water Continue Singulair and Nasonex Refilled Tussionex cough syrup If symptoms persist, consider Doxy 100 mg BID x 10 days  RTC as needed or if symptoms persist.

## 2015-01-15 ENCOUNTER — Encounter: Payer: Self-pay | Admitting: Cardiology

## 2015-01-21 ENCOUNTER — Encounter: Payer: Self-pay | Admitting: *Deleted

## 2015-01-26 ENCOUNTER — Telehealth: Payer: Self-pay | Admitting: Family Medicine

## 2015-01-26 NOTE — Telephone Encounter (Signed)
Last a1c 04/2014 abnormal. pls advise

## 2015-01-27 NOTE — Telephone Encounter (Signed)
Please call and schedule CPE with fasting labs prior for Dr. [name].  

## 2015-01-27 NOTE — Telephone Encounter (Signed)
Ok to refill 90, 0 ref.   Have f/u for DM recheck or CPX in the next few months

## 2015-01-27 NOTE — Telephone Encounter (Signed)
Please do as below

## 2015-01-28 NOTE — Telephone Encounter (Signed)
Appointment already schedule Labs 2/15 cpx  2/22

## 2015-01-29 ENCOUNTER — Other Ambulatory Visit: Payer: Self-pay | Admitting: Family Medicine

## 2015-03-02 ENCOUNTER — Other Ambulatory Visit: Payer: Self-pay | Admitting: *Deleted

## 2015-03-02 MED ORDER — METOPROLOL TARTRATE 100 MG PO TABS: 100.0000 mg | ORAL_TABLET | Freq: Two times a day (BID) | ORAL | Status: DC

## 2015-03-10 ENCOUNTER — Other Ambulatory Visit (INDEPENDENT_AMBULATORY_CARE_PROVIDER_SITE_OTHER): Payer: Medicare Other

## 2015-03-10 DIAGNOSIS — E119 Type 2 diabetes mellitus without complications: Secondary | ICD-10-CM | POA: Diagnosis not present

## 2015-03-10 DIAGNOSIS — R5383 Other fatigue: Secondary | ICD-10-CM

## 2015-03-10 DIAGNOSIS — Z79899 Other long term (current) drug therapy: Secondary | ICD-10-CM | POA: Diagnosis not present

## 2015-03-10 DIAGNOSIS — E785 Hyperlipidemia, unspecified: Secondary | ICD-10-CM | POA: Diagnosis not present

## 2015-03-10 LAB — HEPATIC FUNCTION PANEL
ALBUMIN: 4.4 g/dL (ref 3.5–5.2)
ALK PHOS: 76 U/L (ref 39–117)
ALT: 23 U/L (ref 0–35)
AST: 17 U/L (ref 0–37)
Bilirubin, Direct: 0.1 mg/dL (ref 0.0–0.3)
TOTAL PROTEIN: 7.1 g/dL (ref 6.0–8.3)
Total Bilirubin: 0.7 mg/dL (ref 0.2–1.2)

## 2015-03-10 LAB — LIPID PANEL
CHOL/HDL RATIO: 4
Cholesterol: 193 mg/dL (ref 0–200)
HDL: 44 mg/dL (ref >39.00)
LDL CALC: 112 mg/dL — AB (ref 0–99)
NonHDL: 149.4
TRIGLYCERIDES: 185 mg/dL — AB (ref 0.0–149.0)
VLDL: 37 mg/dL (ref 0.0–40.0)

## 2015-03-10 LAB — CBC WITH DIFFERENTIAL/PLATELET
BASOS PCT: 0.8 % (ref 0.0–3.0)
Basophils Absolute: 0 10*3/uL (ref 0.0–0.1)
Eosinophils Absolute: 0.2 10*3/uL (ref 0.0–0.7)
Eosinophils Relative: 2.9 % (ref 0.0–5.0)
HEMATOCRIT: 42.3 % (ref 36.0–46.0)
HEMOGLOBIN: 14.3 g/dL (ref 12.0–15.0)
LYMPHS PCT: 30.7 % (ref 12.0–46.0)
Lymphs Abs: 1.8 10*3/uL (ref 0.7–4.0)
MCHC: 33.7 g/dL (ref 30.0–36.0)
MCV: 89.9 fl (ref 78.0–100.0)
MONOS PCT: 6.8 % (ref 3.0–12.0)
Monocytes Absolute: 0.4 10*3/uL (ref 0.1–1.0)
NEUTROS ABS: 3.5 10*3/uL (ref 1.4–7.7)
Neutrophils Relative %: 58.8 % (ref 43.0–77.0)
PLATELETS: 241 10*3/uL (ref 150.0–400.0)
RBC: 4.7 Mil/uL (ref 3.87–5.11)
RDW: 12.7 % (ref 11.5–15.5)
WBC: 6 10*3/uL (ref 4.0–10.5)

## 2015-03-10 LAB — MICROALBUMIN / CREATININE URINE RATIO
Creatinine,U: 84.7 mg/dL
Microalb Creat Ratio: 0.8 mg/g (ref 0.0–30.0)
Microalb, Ur: <0.7 mg/dL (ref 0.0–1.9)

## 2015-03-10 LAB — HEMOGLOBIN A1C: Hgb A1c MFr Bld: 6.9 % — ABNORMAL HIGH (ref 4.6–6.5)

## 2015-03-10 LAB — BASIC METABOLIC PANEL
BUN: 16 mg/dL (ref 6–23)
CHLORIDE: 104 meq/L (ref 96–112)
CO2: 26 mEq/L (ref 19–32)
CREATININE: 0.78 mg/dL (ref 0.40–1.20)
Calcium: 9.8 mg/dL (ref 8.4–10.5)
GFR: 77.69 mL/min (ref >60.00)
Glucose, Bld: 164 mg/dL — ABNORMAL HIGH (ref 70–99)
POTASSIUM: 4.4 meq/L (ref 3.5–5.1)
Sodium: 138 mEq/L (ref 135–145)

## 2015-03-10 LAB — TSH: TSH: 1.88 u[IU]/mL (ref 0.35–4.50)

## 2015-03-17 ENCOUNTER — Ambulatory Visit (INDEPENDENT_AMBULATORY_CARE_PROVIDER_SITE_OTHER): Payer: Medicare Other | Admitting: Family Medicine

## 2015-03-17 ENCOUNTER — Encounter: Payer: Self-pay | Admitting: Family Medicine

## 2015-03-17 ENCOUNTER — Encounter: Payer: Self-pay | Admitting: Gastroenterology

## 2015-03-17 VITALS — BP 140/76 | HR 87 | Temp 98.5°F | Ht 62.0 in | Wt 203.8 lb

## 2015-03-17 DIAGNOSIS — E1159 Type 2 diabetes mellitus with other circulatory complications: Secondary | ICD-10-CM

## 2015-03-17 DIAGNOSIS — Z0000 Encounter for general adult medical examination without abnormal findings: Secondary | ICD-10-CM | POA: Diagnosis not present

## 2015-03-17 DIAGNOSIS — Z1211 Encounter for screening for malignant neoplasm of colon: Secondary | ICD-10-CM

## 2015-03-17 DIAGNOSIS — I63119 Cerebral infarction due to embolism of unspecified vertebral artery: Secondary | ICD-10-CM

## 2015-03-17 DIAGNOSIS — G4733 Obstructive sleep apnea (adult) (pediatric): Secondary | ICD-10-CM

## 2015-03-17 DIAGNOSIS — D126 Benign neoplasm of colon, unspecified: Secondary | ICD-10-CM

## 2015-03-17 DIAGNOSIS — I495 Sick sinus syndrome: Secondary | ICD-10-CM

## 2015-03-17 NOTE — Progress Notes (Signed)
Pre visit review using our clinic review tool, if applicable. No additional management support is needed unless otherwise documented below in the visit note. 

## 2015-03-17 NOTE — Progress Notes (Signed)
Dr. Frederico Hamman T. Aleah Ahlgrim, MD, [facility] [place], [address] Phone: [phone] Fax: [phone]  03/17/2015  Patient: Nina Carpenter, MRN: [id number], DOB: 11-17-1945, 70 y.o.  Primary Physician:  [name], MD   Chief Complaint  Patient presents with  . Annual Exam    Medicare Wellness   Subjective:   Nina Carpenter is a 70 y.o. pleasant patient who presents for a medicare wellness examination:  Health Maintenance Summary Reviewed and updated, unless pt declines services.  Tobacco History Reviewed. Non-smoker Alcohol: No concerns, no excessive use Exercise Habits: rare activity, rec at least 30 mins 5 times a week STD concerns: none Drug Use: None Birth control method: n/a Menses regular: n/a Lumps or breast concerns: no Breast Cancer Family History: no  Feeling well No residual stroke sx  Health Maintenance  Topic Date Due  . Hepatitis C Screening  [birth year]  . TETANUS/TDAP  07/27/1964  . ZOSTAVAX  07/27/2005  . MAMMOGRAM  07/03/2010  . DEXA SCAN  07/28/2010  . FOOT EXAM  05/04/2015  . OPHTHALMOLOGY EXAM  05/04/2015  . INFLUENZA VACCINE  08/24/2015  . HEMOGLOBIN A1C  09/07/2015  . COLONOSCOPY  06/16/2018  . PNA vac Low Risk Adult  Completed   Immunization History  Administered Date(s) Administered  . Influenza Split 11/13/2011  . Influenza,inj,Quad PF,36+ Mos 11/18/2012, 10/06/2013, 12/23/2014  . Pneumococcal Conjugate-13 10/06/2013  . Pneumococcal Polysaccharide-23 10/11/2010    Patient Active Problem List   Diagnosis Date Noted  . CVA (cerebral vascular accident) ([place]) 01/06/2013    Priority: High  . Atrial fibrillation ([place]) 12/29/2012    Priority: High  . Type 2 diabetes mellitus with vascular disease ([place]) 05/04/2008    Priority: High  . Cardiac pacemaker in situ 01/06/2013    Priority: Medium  . History of noncompliance with medical treatment 01/06/2013    Priority:  Medium  . Tachy-brady syndrome ([place]) 12/29/2012    Priority: Medium  . Hx of rheumatic fever 08/07/2011    Priority: Medium  . NEURALGIA, TRIGEMINAL 01/26/2010    Priority: Medium  . HYPERLIPIDEMIA 05/04/2008    Priority: Medium  . Essential hypertension 05/04/2008    Priority: Medium  . ASTHMA 05/04/2008    Priority: Medium  . OSA (obstructive sleep apnea) 05/25/2014  . Chronic anticoagulation- Eliquis 01/27/2013  . FATIGUE 01/26/2010  . ALLERGIC RHINITIS 05/04/2008  . GERD 05/04/2008  . POSTMENOPAUSAL STATUS 05/04/2008   Past Medical History  Diagnosis Date  . Allergy   . Asthma     a. Remotely.  . Type 2 diabetes mellitus ([place])   . GERD (gastroesophageal reflux disease)   . Hyperlipidemia   . Hypertension   . Kidney stones     a. Remotely.  Marland Kitchen Hx of rheumatic fever     a. As a child.  Marland Kitchen PAF (paroxysmal atrial fibrillation) ([place])     a. Dx 09/2012, chronicity unclear at that time. Initially refused Xarelto due to risk of bleeding. b. Sustained CVA 12/2012 - agreeable to taking Eliquis. Tachybrady syndrome during that admission s/p Medtronic PPM implantation;  c. Failed flecainide, sotalol, amiodarone (itching), and propafenone.  . Obesity   . CVA (cerebral vascular accident) ([place])     a. Thromboemoblic L MCA CVA 90/2409 with no residual deficit except mild occasional word finding. b. 7-35% RICA/LICA by dopplers 32/9/92.  . Tachy-brady syndrome (HCC)     a. AF RVR with pauses >5sec requiring Medtronic PPM  12/2012.  . H/O medication noncompliance     a. Per PCP note 01/06/13: Previously refused anything greater than aspirin, refused treatment for DM, and often stopped hypertensive meds and lipid meds.  . Chronic combined systolic and diastolic CHF (congestive heart failure) ([place])     a. Felt related to AF. EF 45-50% by echo 12/2012.  Marland Kitchen Acid reflux disease    Past Surgical History  Procedure Laterality Date  . Cholecystectomy    . Tubal ligation      ectopic preganacy     . Destruction trigeminal nerve via neurolytic agent  NOv @013     Gamma Knife   . Pacemaker insertion  12-30-12    MDT dual chamber pacemaker implanted by Dr Caryl Comes for tachy-brady syndrome  . Insert / replace / remove pacemaker    . Permanent pacemaker insertion N/A 12/30/2012    Procedure: PERMANENT PACEMAKER INSERTION;  Surgeon: Deboraha Sprang, MD;  Location: [facility] CATH LAB;  Service: Cardiovascular;  Laterality: N/A;   Social History   Social History  . Marital Status: Married    Spouse Name: N/A  . Number of Children: 2  . Years of Education: N/A   Occupational History  . cosmetologist    Social History Main Topics  . Smoking status: Former Smoker -- 2.00 packs/day    Types: Cigarettes    Quit date: 01/24/1968  . Smokeless tobacco: Never Used  . Alcohol Use: 0.6 oz/week    1 Glasses of wine per week     Comment: rare use  . Drug Use: No  . Sexual Activity: Yes   Other Topics Concern  . Not on file   Social History Narrative   Lives in [place] with her husband.  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   Family History  Problem Relation Age of Onset  . Stroke Mother   . Diabetes Mother    Allergies  Allergen Reactions  . Amiodarone Itching  . Crestor [Rosuvastatin]     myalgia  . Fish Allergy Swelling    Shell fish  . Iodine Itching    itching  . Lipitor [Atorvastatin]     myalgias  . Penicillins Hives and Itching  . Ace Inhibitors Cough  . Lidocaine Hcl Palpitations    ALL CAINES given during dental procedures    Medication list has been reviewed and updated.   General: Denies fever, chills, sweats. No significant weight loss. Eyes: Denies blurring,significant itching ENT: Denies earache, sore throat, and hoarseness.  Cardiovascular: Denies chest pains, palpitations, dyspnea on exertion,  Respiratory: Denies cough, dyspnea at rest,wheeezing Breast: no concerns about lumps GI: Denies nausea, vomiting, diarrhea, constipation, change in bowel  habits, abdominal pain, melena, hematochezia GU: Denies dysuria, hematuria, urinary hesitancy, nocturia, denies STD risk, no concerns about discharge Musculoskeletal: Denies back pain, joint pain Derm: Denies rash, itching Neuro: Denies  paresthesias, frequent falls, frequent headaches Psych: Denies depression, anxiety Endocrine: Denies cold intolerance, heat intolerance, polydipsia Heme: Denies enlarged lymph nodes Allergy: No hayfever  Objective:   BP 140/76 mmHg  Pulse 87  Temp(Src) 98.5 F (36.9 C) (Oral)  Ht 5' 2"  (1.575 m)  Wt 203 lb 12 oz (92.42 kg)  BMI 37.26 kg/m2  The patient completed a fall screen and PHQ-2 and PHQ-9 if necessary, which is documented in the EHR. The CMA/LPN/RN who assisted the patient verbally completed with them and documented results in [facility] EHR.   Hearing Screening   Method: Audiometry   125Hz  250Hz   500Hz  1000Hz  2000Hz  4000Hz  8000Hz   Right ear:   25 25 20 25    Left ear:   20 20 25  0     Visual Acuity Screening   Right eye Left eye Both eyes  Without correction:     With correction: 20/13 20/13 20/13     GEN: well developed, well nourished, no acute distress Eyes: conjunctiva and lids normal, PERRLA, EOMI ENT: TM clear, nares clear, oral exam WNL Neck: supple, no lymphadenopathy, no thyromegaly, no JVD Pulm: clear to auscultation and percussion, respiratory effort normal CV: regular rate and rhythm, S1-S2, no murmur, rub or gallop, no bruits Chest: no scars, masses, no lumps BREAST: breast exam declined GI: soft, non-tender; no hepatosplenomegaly, masses; active bowel sounds all quadrants GU: GU exam declined Lymph: no cervical, axillary or inguinal adenopathy MSK: gait normal, muscle tone and strength WNL, no joint swelling, effusions, discoloration, crepitus  SKIN: clear, good turgor, color WNL, no rashes, lesions, or ulcerations Neuro: normal mental status, normal strength, sensation, and motion Psych: alert; oriented to  person, place and time, normally interactive and not anxious or depressed in appearance.   All labs reviewed with patient. Lipids:    Component Value Date/Time   CHOL 193 03/10/2015 0845   TRIG 185.0* 03/10/2015 0845   HDL 44.00 03/10/2015 0845   LDLDIRECT 178.0 11/18/2012 1049   VLDL 37.0 03/10/2015 0845   CHOLHDL 4 03/10/2015 0845   CBC: CBC Latest Ref Rng 03/10/2015 10/06/2013 05/09/2013  WBC 4.0 - 10.5 K/uL 6.0 7.1 7.2  Hemoglobin 12.0 - 15.0 g/dL 14.3 14.6 13.3  Hematocrit 36.0 - 46.0 % 42.3 43.9 40.0  Platelets 150.0 - 400.0 K/uL 241.0 230.0 937    Basic Metabolic Panel:    Component Value Date/Time   NA 138 03/10/2015 0845   NA 139 05/09/2013 2023   NA 141 03/31/2013 0918   K 4.4 03/10/2015 0845   K 3.9 05/09/2013 2023   CL 104 03/10/2015 0845   CL 103 05/09/2013 2023   CO2 26 03/10/2015 0845   CO2 29 05/09/2013 2023   BUN 16 03/10/2015 0845   BUN 14 05/09/2013 2023   BUN 19 03/31/2013 0918   CREATININE 0.78 03/10/2015 0845   CREATININE 0.97 05/09/2013 2023   GLUCOSE 164* 03/10/2015 0845   GLUCOSE 141* 05/09/2013 2023   GLUCOSE 181* 03/31/2013 0918   CALCIUM 9.8 03/10/2015 0845   CALCIUM 9.5 05/09/2013 2023   Hepatic Function Latest Ref Rng 03/10/2015 10/06/2013 01/11/2013  Total Protein 6.0 - 8.3 g/dL 7.1 7.3 6.6  Albumin 3.5 - 5.2 g/dL 4.4 4.2 3.7  AST 0 - 37 U/L 17 22 16   ALT 0 - 35 U/L 23 27 24   Alk Phosphatase 39 - 117 U/L 76 72 78  Total Bilirubin 0.2 - 1.2 mg/dL 0.7 0.6 0.4  Bilirubin, Direct 0.0 - 0.3 mg/dL 0.1 0.0 <0.1    Lab Results  Component Value Date   TSH 1.88 03/10/2015    No results found.  Assessment and Plan:   Healthcare maintenance  Encounter for screening colonoscopy - Plan: Ambulatory referral to Gastroenterology  Adenomatous polyp of colon - Plan: Ambulatory referral to Gastroenterology  Type 2 diabetes mellitus with vascular disease ([place])  Cerebrovascular accident (CVA) due to embolism of vertebral artery, unspecified  blood vessel laterality ([place])  Tachy-brady syndrome (HCC)  OSA (obstructive sleep apnea)  All chronic med issues stable  Health Maintenance Exam: The patient's preventative maintenance and recommended screening tests for an annual wellness exam were reviewed  in full today. Brought up to date unless services declined.  Counselled on the importance of diet, exercise, and its role in overall health and mortality. The patient's FH and SH was reviewed, including their home life, tobacco status, and drug and alcohol status.  I have personally reviewed the Medicare Annual Wellness questionnaire and have noted 1. The patient's medical and social history 2. Their use of alcohol, tobacco or illicit drugs 3. Their current medications and supplements 4. The patient's functional ability including ADL's, fall risks, home safety risks and hearing or visual             impairment. 5. Diet and physical activities 6. Evidence for depression or mood disorders 7. Reviewed Updated provider list, see scanned forms and [facility] Snapshot.   The patients weight, height, BMI and visual acuity have been recorded in the chart I have made referrals, counseling and provided education to the patient based review of the above and I have provided the pt with a written personalized care plan for preventive services.  I have provided the patient with a copy of your personalized plan for preventive services. Instructed to take the time to review along with their updated medication list.  Follow-up: No Follow-up on file. Or follow-up in 1 year for complete physical examination  New Prescriptions   No medications on file   Modified Medications   No medications on file   Orders Placed This Encounter  Procedures  . Ambulatory referral to Gastroenterology    Signed,  Frederico Hamman T. Jamelle Goldston, MD   Patient's Medications  New Prescriptions   No medications on file  Previous Medications   APIXABAN (ELIQUIS) 5 MG TABS  TABLET    Take 1 tablet (5 mg total) by mouth 2 (two) times daily.   GLIPIZIDE (GLUCOTROL XL) 5 MG 24 HR TABLET    take 1 tablet by mouth every morning before BREAKFAST   LOSARTAN (COZAAR) 100 MG TABLET    take 1 tablet by mouth once daily   METOPROLOL (LOPRESSOR) 100 MG TABLET    Take 1 tablet (100 mg total) by mouth 2 (two) times daily.   MONTELUKAST (SINGULAIR) 10 MG TABLET    Take 1 tablet (10 mg total) by mouth at bedtime.   PANTOPRAZOLE (PROTONIX) 40 MG TABLET    Take 40 mg by mouth daily as needed (for acid reflux).   PRAVASTATIN (PRAVACHOL) 80 MG TABLET    Take 1 tablet (80 mg total) by mouth daily.  Modified Medications   No medications on file  Discontinued Medications   CHLORPHENIRAMINE-HYDROCODONE (TUSSIONEX PENNKINETIC ER) 10-8 MG/5ML SUER    Take 5 mLs by mouth at bedtime as needed for cough.   MOMETASONE (NASONEX) 50 MCG/ACT NASAL SPRAY    Place 2 sprays into the nose daily.

## 2015-03-25 ENCOUNTER — Other Ambulatory Visit: Payer: Self-pay | Admitting: *Deleted

## 2015-03-25 MED ORDER — PRAVASTATIN SODIUM 80 MG PO TABS: 80.0000 mg | ORAL_TABLET | Freq: Every day | ORAL | Status: DC

## 2015-03-29 ENCOUNTER — Other Ambulatory Visit: Payer: Self-pay | Admitting: *Deleted

## 2015-03-29 MED ORDER — METOPROLOL TARTRATE 100 MG PO TABS: 100.0000 mg | ORAL_TABLET | Freq: Two times a day (BID) | ORAL | Status: DC

## 2015-03-30 ENCOUNTER — Encounter: Payer: Self-pay | Admitting: Internal Medicine

## 2015-03-30 ENCOUNTER — Ambulatory Visit (INDEPENDENT_AMBULATORY_CARE_PROVIDER_SITE_OTHER): Payer: Medicare Other | Admitting: Internal Medicine

## 2015-03-30 VITALS — BP 140/88 | HR 77 | Ht 62.0 in | Wt 207.5 lb

## 2015-03-30 DIAGNOSIS — I63119 Cerebral infarction due to embolism of unspecified vertebral artery: Secondary | ICD-10-CM

## 2015-03-30 DIAGNOSIS — I10 Essential (primary) hypertension: Secondary | ICD-10-CM | POA: Diagnosis not present

## 2015-03-30 DIAGNOSIS — I4891 Unspecified atrial fibrillation: Secondary | ICD-10-CM

## 2015-03-30 DIAGNOSIS — I495 Sick sinus syndrome: Secondary | ICD-10-CM | POA: Diagnosis not present

## 2015-03-30 MED ORDER — SIMVASTATIN 80 MG PO TABS: 80.0000 mg | ORAL_TABLET | Freq: Every day | ORAL | Status: DC

## 2015-03-30 NOTE — Patient Instructions (Signed)
Medication Instructions: 1) In 2 weeks (around 04/13/15) Decrease metoprolol tartrate to 50 mg twice daily 2) In 4 weeks (around 04/27/15) Stop pravastatin and start Simvastatin 80 mg once daily  Labwork: - none  Procedures/Testing: - none  Follow-Up: - Remote monitoring is used to monitor your Pacemaker of ICD from home. This monitoring reduces the number of office visits required to check your device to one time per year. It allows [name] to keep an eye on the functioning of your device to ensure it is working properly. You are scheduled for a device check from home on 06/29/15. You may send your transmission at any time that day. If you have a wireless device, the transmission will be sent automatically. After your physician reviews your transmission, you will receive a postcard with your next transmission date.  - Your physician wants you to follow-up in: 1 year with Dr. Caryl Comes. You will receive a reminder letter in the mail two months in advance. If you don't receive a letter, please call our office to schedule the follow-up appointment.  Any Additional Special Instructions Will Be Listed Below (If Applicable).     If you need a refill on your cardiac medications before your next appointment, please call your pharmacy.

## 2015-03-30 NOTE — Progress Notes (Signed)
Patient Care Team: [name], MD as PCP - General   HPI  Nina Carpenter is a 70 y.o. female Seen in follow-up for atrial fibrillation. She underwent PVI at [facility] 2015 There is been a marked diminution in atrial fibrillation symptoms and by device interrogation burden as well. She does have some nonsustained atrial tachycardia/slow flutter. She also pacemaker for tachybrady syndrome (2104)  She has previous CVA  She continues to struggle with hypertension. Her blood pressures at home are in the 150 range. She has still not been able to complete  her sleep study for technical reasons.  She denies chest pain shortness of breath or edema. She does have complaints of fatigue.  She has a history of bradycardia and prior pacemaker implantation.  LDL remains elevated on prava 80; these labs were reviewed Past Medical History  Diagnosis Date  . Allergy   . Asthma     a. Remotely.  . Type 2 diabetes mellitus ([place])   . GERD (gastroesophageal reflux disease)   . Hyperlipidemia   . Hypertension   . Kidney stones     a. Remotely.  Marland Kitchen Hx of rheumatic fever     a. As a child.  Marland Kitchen PAF (paroxysmal atrial fibrillation) ([place])     a. Dx 09/2012, chronicity unclear at that time. Initially refused Xarelto due to risk of bleeding. b. Sustained CVA 12/2012 - agreeable to taking Eliquis. Tachybrady syndrome during that admission s/p Medtronic PPM implantation;  c. Failed flecainide, sotalol, amiodarone (itching), and propafenone.  . Obesity   . CVA (cerebral vascular accident) ([place])     a. Thromboemoblic L MCA CVA 123456 with no residual deficit except mild occasional word finding. b. 123456 RICA/LICA by dopplers [id number].  . Tachy-brady syndrome ([place])     a. AF RVR with pauses >5sec requiring Medtronic PPM 12/2012.  . H/O medication noncompliance     a. Per PCP note 01/06/13: Previously refused anything greater than aspirin, refused treatment for DM, and often stopped hypertensive meds  and lipid meds.  . Chronic combined systolic and diastolic CHF (congestive heart failure) ([place])     a. Felt related to AF. EF 45-50% by echo 12/2012.  Marland Kitchen Acid reflux disease     Past Surgical History  Procedure Laterality Date  . Cholecystectomy    . Tubal ligation      ectopic preganacy   . Destruction trigeminal nerve via neurolytic agent  NOv @013     Gamma Knife   . Pacemaker insertion  12-30-12    MDT dual chamber pacemaker implanted by Dr Caryl Comes for tachy-brady syndrome  . Insert / replace / remove pacemaker    . Permanent pacemaker insertion N/A 12/30/2012    Procedure: PERMANENT PACEMAKER INSERTION;  Surgeon: Deboraha Sprang, MD;  [facility] CATH LAB;  Service: Cardiovascular;  Laterality: N/A;    Current Outpatient Prescriptions  Medication Sig Dispense Refill  . apixaban (ELIQUIS) 5 MG TABS tablet Take 1 tablet (5 mg total) by mouth 2 (two) times daily. 60 tablet 6  . glipiZIDE (GLUCOTROL XL) 5 MG 24 hr tablet take 1 tablet by mouth every morning before BREAKFAST 90 tablet 0  . losartan (COZAAR) 100 MG tablet take 1 tablet by mouth once daily 30 tablet 5  . metoprolol (LOPRESSOR) 100 MG tablet Take 1 tablet (100 mg total) by mouth 2 (two) times daily. 60 tablet 11  . montelukast (SINGULAIR) 10 MG tablet Take 1 tablet (10 mg total) by  mouth at bedtime. 30 tablet 2  . pantoprazole (PROTONIX) 40 MG tablet Take 40 mg by mouth daily as needed (for acid reflux).    . pravastatin (PRAVACHOL) 80 MG tablet Take 1 tablet (80 mg total) by mouth daily. 30 tablet 11   No current facility-administered medications for this visit.    Allergies  Allergen Reactions  . Amiodarone Itching  . Crestor [Rosuvastatin]     myalgia  . Fish Allergy Swelling    Shell fish  . Iodine Itching    itching  . Lipitor [Atorvastatin]     myalgias  . Penicillins Hives and Itching  . Ace Inhibitors Cough  . Lidocaine Hcl Palpitations    ALL CAINES given during dental procedures    Review of Systems  negative except from HPI and PMH  Physical Exam  BP 140/88 mmHg  Pulse 77  Ht 5\' 2"  (1.575 m)  Wt 207 lb 8 oz (94.121 kg)  BMI 37.94 kg/m2  Well developed and well nourished in no acute distress HENT normal E scleral and icterus clear Neck Supple JVP flat; carotids brisk and full Clear to ausculation Device pocket well healed; without hematoma or erythema.  There is no tethering Regular rate and rhythm, no murmurs gallops or rub Soft with active bowel sounds No clubbing cyanosis  Edema Alert and oriented, grossly normal motor and sensory function Skin Warm and Dry  ECG demonstrates atrial pacing at 77 Intervals 22/08/41 Otherwise normal  Assessment and  Plan  Atrial fibrillation-paroxysmal status post PVI [facility] for/15  Hypertension  Diabetes  Hyperlipidemia  HFpEF  Sleep disordered breathing  I would recommend change her xtatin to high dose>> simvastatin 80. She has not tolerated   atorvastatin or rosuvastatin in the past  Euvolemic continue current meds; she does use her diuretics when necessary  She was never able to complete her sleep study.  As relates to her fatigue, to potential targets are evident. The first is a beta blocker; we will decrease it to 100--50 twice a day to see whether we note any improvement. In the event that we do further medication adjustments would be in order. We will do this 2 weeks following device reprogramming where we have noted that her rate response seems to be exuberant.  Blood pressure is pretty close to target given her diabetes and the [place] Trial

## 2015-04-07 LAB — CUP PACEART INCLINIC DEVICE CHECK
Date Time Interrogation Session: 20170315125056
Implantable Lead Implant Date: 20141208
Implantable Lead Location: 753860
Implantable Lead Model: 5076
Implantable Lead Model: 5076
MDC IDC LEAD IMPLANT DT: 20141208
MDC IDC LEAD LOCATION: 753859

## 2015-04-14 ENCOUNTER — Other Ambulatory Visit: Payer: Self-pay | Admitting: *Deleted

## 2015-04-14 MED ORDER — GLIPIZIDE ER 5 MG PO TB24: ORAL_TABLET | ORAL | Status: DC

## 2015-04-14 MED ORDER — LOSARTAN POTASSIUM 100 MG PO TABS: 100.0000 mg | ORAL_TABLET | Freq: Every day | ORAL | Status: DC

## 2015-04-28 ENCOUNTER — Other Ambulatory Visit: Payer: Self-pay | Admitting: Internal Medicine

## 2015-05-18 ENCOUNTER — Ambulatory Visit: Payer: Medicare Other | Admitting: Gastroenterology

## 2015-05-19 DIAGNOSIS — D485 Neoplasm of uncertain behavior of skin: Secondary | ICD-10-CM | POA: Diagnosis not present

## 2015-05-19 DIAGNOSIS — C44629 Squamous cell carcinoma of skin of left upper limb, including shoulder: Secondary | ICD-10-CM | POA: Diagnosis not present

## 2015-05-19 DIAGNOSIS — L814 Other melanin hyperpigmentation: Secondary | ICD-10-CM | POA: Diagnosis not present

## 2015-05-19 DIAGNOSIS — L821 Other seborrheic keratosis: Secondary | ICD-10-CM | POA: Diagnosis not present

## 2015-06-02 DIAGNOSIS — C44629 Squamous cell carcinoma of skin of left upper limb, including shoulder: Secondary | ICD-10-CM | POA: Diagnosis not present

## 2015-06-28 LAB — HM DIABETES EYE EXAM

## 2015-06-29 ENCOUNTER — Ambulatory Visit (INDEPENDENT_AMBULATORY_CARE_PROVIDER_SITE_OTHER): Payer: Medicare Other | Admitting: *Deleted

## 2015-06-29 DIAGNOSIS — I495 Sick sinus syndrome: Secondary | ICD-10-CM | POA: Diagnosis not present

## 2015-06-29 NOTE — Progress Notes (Signed)
Remote pacemaker transmission.   

## 2015-07-08 LAB — CUP PACEART REMOTE DEVICE CHECK
Battery Remaining Longevity: 90 mo
Battery Voltage: 3.01 V
Brady Statistic AP VP Percent: 0.05 %
Brady Statistic AS VP Percent: 0.03 %
Brady Statistic AS VS Percent: 73.88 %
Brady Statistic RV Percent Paced: 0.09 %
Implantable Lead Implant Date: 20141208
Implantable Lead Location: 753859
Lead Channel Impedance Value: 399 Ohm
Lead Channel Impedance Value: 399 Ohm
Lead Channel Impedance Value: 437 Ohm
Lead Channel Pacing Threshold Amplitude: 0.5 V
Lead Channel Pacing Threshold Amplitude: 0.5 V
Lead Channel Sensing Intrinsic Amplitude: 2 mV
Lead Channel Sensing Intrinsic Amplitude: 4.875 mV
Lead Channel Sensing Intrinsic Amplitude: 4.875 mV
Lead Channel Setting Pacing Amplitude: 2 V
Lead Channel Setting Pacing Amplitude: 2.5 V
Lead Channel Setting Pacing Pulse Width: 0.4 ms
Lead Channel Setting Sensing Sensitivity: 2 mV
MDC IDC LEAD IMPLANT DT: 20141208
MDC IDC LEAD LOCATION: 753860
MDC IDC MSMT LEADCHNL RA IMPEDANCE VALUE: 342 Ohm
MDC IDC MSMT LEADCHNL RA PACING THRESHOLD PULSEWIDTH: 0.4 ms
MDC IDC MSMT LEADCHNL RA SENSING INTR AMPL: 2 mV
MDC IDC MSMT LEADCHNL RV PACING THRESHOLD PULSEWIDTH: 0.4 ms
MDC IDC SESS DTM: 20170606143007
MDC IDC STAT BRADY AP VS PERCENT: 26.04 %
MDC IDC STAT BRADY RA PERCENT PACED: 26.09 %

## 2015-07-14 ENCOUNTER — Encounter: Payer: Self-pay | Admitting: Cardiology

## 2015-07-16 ENCOUNTER — Telehealth: Payer: Self-pay | Admitting: Gastroenterology

## 2015-07-16 ENCOUNTER — Ambulatory Visit: Payer: Medicare Other | Admitting: Gastroenterology

## 2015-07-16 NOTE — Telephone Encounter (Signed)
Noted  

## 2015-07-19 ENCOUNTER — Encounter: Payer: Self-pay | Admitting: Family Medicine

## 2015-07-29 ENCOUNTER — Encounter: Payer: Self-pay | Admitting: Cardiology

## 2015-09-13 ENCOUNTER — Ambulatory Visit: Payer: Medicare Other | Admitting: Family Medicine

## 2015-09-20 ENCOUNTER — Encounter: Payer: Self-pay | Admitting: Family Medicine

## 2015-09-20 ENCOUNTER — Ambulatory Visit (INDEPENDENT_AMBULATORY_CARE_PROVIDER_SITE_OTHER): Payer: Medicare Other | Admitting: Family Medicine

## 2015-09-20 VITALS — BP 140/74 | HR 73 | Temp 98.5°F | Ht 62.0 in | Wt 207.1 lb

## 2015-09-20 DIAGNOSIS — E1159 Type 2 diabetes mellitus with other circulatory complications: Secondary | ICD-10-CM

## 2015-09-20 DIAGNOSIS — E785 Hyperlipidemia, unspecified: Secondary | ICD-10-CM | POA: Diagnosis not present

## 2015-09-20 DIAGNOSIS — I63119 Cerebral infarction due to embolism of unspecified vertebral artery: Secondary | ICD-10-CM

## 2015-09-20 DIAGNOSIS — I10 Essential (primary) hypertension: Secondary | ICD-10-CM | POA: Diagnosis not present

## 2015-09-20 LAB — HEMOGLOBIN A1C: Hgb A1c MFr Bld: 7.1 % — ABNORMAL HIGH (ref 4.6–6.5)

## 2015-09-20 NOTE — Progress Notes (Signed)
Pre visit review using our clinic review tool, if applicable. No additional management support is needed unless otherwise documented below in the visit note. 

## 2015-09-20 NOTE — Progress Notes (Signed)
Dr. Frederico Hamman T. Collin Hendley, MD, [facility] [place], [address] Phone: [phone] Fax: [phone]  09/20/2015  Patient: Nina Carpenter, MRN: [id number], DOB: 1945/08/24, 70 y.o.  Primary Physician:  [name], MD   Chief Complaint  Patient presents with  . Follow-up    6 month   Subjective:   Nina Carpenter is a 70 y.o. very pleasant female patient who presents with the following:  145/74 on recheck  Wt Readings from Last 3 Encounters:  09/20/15 207 lb 2 oz (94 kg)  03/30/15 207 lb 8 oz (94.1 kg)  03/17/15 203 lb 12 oz (92.4 kg)    Diabetes Mellitus: Tolerating Medications: yes Compliance with diet: very good Exercise: minimal / intermittent Avg blood sugars at home: not checking Foot problems: none Hypoglycemia: none No nausea, vomitting, blurred vision, polyuria.  Lab Results  Component Value Date   HGBA1C 7.1 (H) 09/20/2015   HGBA1C 6.9 (H) 03/10/2015   HGBA1C 7.4 (H) 05/04/2014   Lab Results  Component Value Date   MICROALBUR <0.7 03/10/2015   LDLCALC 112 (H) 03/10/2015   CREATININE 0.78 03/10/2015    Wt Readings from Last 3 Encounters:  09/20/15 207 lb 2 oz (94 kg)  03/30/15 207 lb 8 oz (94.1 kg)  03/17/15 203 lb 12 oz (92.4 kg)    Body mass index is 37.88 kg/m.   HTN: Tolerating all medications without side effects Stable and at goal No CP, no sob. No HA.  BP Readings from Last 3 Encounters:  09/21/15 140/74  03/30/15 140/88  03/17/15 [id number]    Basic Metabolic Panel:    Component Value Date/Time   NA 138 03/10/2015 0845   NA 139 05/09/2013 2023   K 4.4 03/10/2015 0845   K 3.9 05/09/2013 2023   CL 104 03/10/2015 0845   CL 103 05/09/2013 2023   CO2 26 03/10/2015 0845   CO2 29 05/09/2013 2023   BUN 16 03/10/2015 0845   BUN 14 05/09/2013 2023   CREATININE 0.78 03/10/2015 0845   CREATININE 0.97 05/09/2013 2023   GLUCOSE 164 (H) 03/10/2015 0845   GLUCOSE 141 (H)  05/09/2013 2023   CALCIUM 9.8 03/10/2015 0845   CALCIUM 9.5 05/09/2013 2023    Lipids: Doing well, stable. Tolerating meds fine with no SE. Panel reviewed with patient.  Lipids:    Component Value Date/Time   CHOL 193 03/10/2015 0845   TRIG 185.0 (H) 03/10/2015 0845   HDL 44.00 03/10/2015 0845   LDLDIRECT 178.0 11/18/2012 1049   VLDL 37.0 03/10/2015 0845   CHOLHDL 4 03/10/2015 0845    Lab Results  Component Value Date   ALT 23 03/10/2015   AST 17 03/10/2015   ALKPHOS 76 03/10/2015   BILITOT 0.7 03/10/2015     Past Medical History, Surgical History, Social History, Family History, Problem List, Medications, and Allergies have been reviewed and updated if relevant.  Patient Active Problem List   Diagnosis Date Noted  . CVA (cerebral vascular accident) ([place]) 01/06/2013    Priority: High  . Atrial fibrillation ([place]) 12/29/2012    Priority: High  . Type 2 diabetes mellitus with vascular disease ([place]) 05/04/2008    Priority: High  . Cardiac pacemaker in situ 01/06/2013    Priority: Medium  . History of noncompliance with medical treatment 01/06/2013    Priority: Medium  . Tachy-brady syndrome ([place]) 12/29/2012    Priority: Medium  . Hx of rheumatic fever 08/07/2011  Priority: Medium  . NEURALGIA, TRIGEMINAL 01/26/2010    Priority: Medium  . HYPERLIPIDEMIA 05/04/2008    Priority: Medium  . Essential hypertension 05/04/2008    Priority: Medium  . ASTHMA 05/04/2008    Priority: Medium  . OSA (obstructive sleep apnea) 05/25/2014  . Chronic anticoagulation- Eliquis 01/27/2013  . FATIGUE 01/26/2010  . ALLERGIC RHINITIS 05/04/2008  . GERD 05/04/2008  . POSTMENOPAUSAL STATUS 05/04/2008    Past Medical History:  Diagnosis Date  . Acid reflux disease   . Allergy   . Asthma    a. Remotely.  . Chronic combined systolic and diastolic CHF (congestive heart failure) ([place])    a. Felt related to AF. EF 45-50% by echo 12/2012.  Marland Kitchen CVA (cerebral vascular accident) ([place])     a. Thromboemoblic L MCA CVA 123456 with no residual deficit except mild occasional word finding. b. 123456 RICA/LICA by dopplers [id number].  Marland Kitchen GERD (gastroesophageal reflux disease)   . H/O medication noncompliance    a. Per PCP note 01/06/13: Previously refused anything greater than aspirin, refused treatment for DM, and often stopped hypertensive meds and lipid meds.  Marland Kitchen Hx of rheumatic fever    a. As a child.  . Hyperlipidemia   . Hypertension   . Kidney stones    a. Remotely.  . Obesity   . PAF (paroxysmal atrial fibrillation) ([place])    a. Dx 09/2012, chronicity unclear at that time. Initially refused Xarelto due to risk of bleeding. b. Sustained CVA 12/2012 - agreeable to taking Eliquis. Tachybrady syndrome during that admission s/p Medtronic PPM implantation;  c. Failed flecainide, sotalol, amiodarone (itching), and propafenone.  . Tachy-brady syndrome ([place])    a. AF RVR with pauses >5sec requiring Medtronic PPM 12/2012.  . Tubular adenoma of colon 05/2008  . Type 2 diabetes mellitus ([place])     Past Surgical History:  Procedure Laterality Date  . CHOLECYSTECTOMY    . DESTRUCTION TRIGEMINAL NERVE VIA NEUROLYTIC AGENT  NOv @013    Gamma Knife   . INSERT / REPLACE / REMOVE PACEMAKER    . PACEMAKER INSERTION  12-30-12   MDT dual chamber pacemaker implanted by Dr Caryl Comes for tachy-brady syndrome  . PERMANENT PACEMAKER INSERTION N/A 12/30/2012   Procedure: PERMANENT PACEMAKER INSERTION;  Surgeon: Deboraha Sprang, MD;  Location: [facility] CATH LAB;  Service: Cardiovascular;  Laterality: N/A;  . TUBAL LIGATION     ectopic preganacy     Social History   Social History  . Marital status: Married    Spouse name: N/A  . Number of children: 2  . Years of education: N/A   Occupational History  . cosmetologist Self Employed   Social History Main Topics  . Smoking status: Former Smoker    Packs/day: 2.00    Types: Cigarettes    Quit date: 01/24/1968  . Smokeless tobacco: Never Used  . Alcohol use  0.6 oz/week    1 Glasses of wine per week     Comment: rare use  . Drug use: No  . Sexual activity: Yes   Other Topics Concern  . Not on file   Social History Narrative   Lives in [place] with her husband.  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    Family History  Problem Relation Age of Onset  . Stroke Mother   . Diabetes Mother     Allergies  Allergen Reactions  . Amiodarone Itching  . Crestor [Rosuvastatin]     myalgia  . Fish Allergy Swelling  Shell fish  . Iodine Itching    itching  . Lipitor [Atorvastatin]     myalgias  . Penicillins Hives and Itching  . Ace Inhibitors Cough  . Lidocaine Hcl Palpitations    ALL CAINES given during dental procedures    Medication list reviewed and updated in full in [facility].   GEN: No acute illnesses, no fevers, chills. GI: No n/v/d, eating normally Pulm: No SOB Interactive and getting along well at home.  Otherwise, ROS is as per the HPI.  Objective:   Vitals:   09/20/15 1423 09/21/15 0940  BP: (!) 160/80 140/74  Pulse: 73   Temp: 98.5 F (36.9 C)   TempSrc: Oral   Weight: 207 lb 2 oz (94 kg)   Height: 5\' 2"  (1.575 m)      GEN: WDWN, NAD, Non-toxic, A & O x 3 HEENT: Atraumatic, Normocephalic. Neck supple. No masses, No LAD. Ears and Nose: No external deformity. CV: RRR, No M/G/R. No JVD. No thrill. No extra heart sounds. PULM: CTA B, no wheezes, crackles, rhonchi. No retractions. No resp. distress. No accessory muscle use. EXTR: No c/c/e NEURO Normal gait.  PSYCH: Normally interactive. Conversant. Not depressed or anxious appearing.  Calm demeanor.   Laboratory and Imaging Data:  Assessment and Plan:   Type 2 diabetes mellitus with vascular disease ([place]) - Plan: Hemoglobin A1c  Hyperlipidemia LDL goal <70  Essential hypertension  DM stable, BP stable Taking all meds now Doing well with diet.  Follow-up: Return in about 6 months (around 03/22/2016) for Medicare Wellness.  Orders  Placed This Encounter  Procedures  . Hemoglobin A1c    Signed,  Sophiya Morello T. Iwalani Templeton, MD   Patient's Medications  New Prescriptions   No medications on file  Previous Medications   ELIQUIS 5 MG TABS TABLET    take 1 tablet by mouth twice a day   GLIPIZIDE (GLUCOTROL XL) 5 MG 24 HR TABLET    take 1 tablet by mouth every morning before BREAKFAST   LOSARTAN (COZAAR) 100 MG TABLET    Take 1 tablet (100 mg total) by mouth daily.   METOPROLOL (LOPRESSOR) 100 MG TABLET    Take 0.5 tablets (50 mg total) by mouth 2 (two) times daily.   MONTELUKAST (SINGULAIR) 10 MG TABLET    Take 1 tablet (10 mg total) by mouth at bedtime.   PANTOPRAZOLE (PROTONIX) 40 MG TABLET    Take 40 mg by mouth daily as needed (for acid reflux).   SIMVASTATIN (ZOCOR) 80 MG TABLET    Take 1 tablet (80 mg total) by mouth daily.  Modified Medications   No medications on file  Discontinued Medications   No medications on file

## 2015-09-28 ENCOUNTER — Ambulatory Visit (INDEPENDENT_AMBULATORY_CARE_PROVIDER_SITE_OTHER): Payer: Medicare Other | Admitting: *Deleted

## 2015-09-28 ENCOUNTER — Telehealth: Payer: Self-pay | Admitting: Cardiology

## 2015-09-28 DIAGNOSIS — I495 Sick sinus syndrome: Secondary | ICD-10-CM

## 2015-09-28 NOTE — Telephone Encounter (Signed)
Spoke with pt and reminded pt of remote transmission that is due today. Pt verbalized understanding.   

## 2015-09-29 NOTE — Progress Notes (Signed)
Remote pacemaker transmission.   

## 2015-10-01 ENCOUNTER — Encounter: Payer: Self-pay | Admitting: Cardiology

## 2015-10-05 LAB — CUP PACEART REMOTE DEVICE CHECK
Brady Statistic AP VP Percent: 0.05 %
Brady Statistic AP VS Percent: 21.94 %
Brady Statistic RV Percent Paced: 0.08 %
Date Time Interrogation Session: 20170905173059
Implantable Lead Implant Date: 20141208
Implantable Lead Location: 753859
Implantable Lead Model: 5076
Lead Channel Impedance Value: 380 Ohm
Lead Channel Impedance Value: 380 Ohm
Lead Channel Impedance Value: 437 Ohm
Lead Channel Pacing Threshold Amplitude: 0.5 V
Lead Channel Sensing Intrinsic Amplitude: 1.25 mV
Lead Channel Sensing Intrinsic Amplitude: 5.5 mV
Lead Channel Setting Pacing Amplitude: 2 V
Lead Channel Setting Pacing Amplitude: 2.5 V
Lead Channel Setting Sensing Sensitivity: 2 mV
MDC IDC LEAD IMPLANT DT: 20141208
MDC IDC LEAD LOCATION: 753860
MDC IDC MSMT BATTERY REMAINING LONGEVITY: 88 mo
MDC IDC MSMT BATTERY VOLTAGE: 3.01 V
MDC IDC MSMT LEADCHNL RA IMPEDANCE VALUE: 342 Ohm
MDC IDC MSMT LEADCHNL RA PACING THRESHOLD AMPLITUDE: 0.5 V
MDC IDC MSMT LEADCHNL RA PACING THRESHOLD PULSEWIDTH: 0.4 ms
MDC IDC MSMT LEADCHNL RA SENSING INTR AMPL: 1.25 mV
MDC IDC MSMT LEADCHNL RV PACING THRESHOLD PULSEWIDTH: 0.4 ms
MDC IDC MSMT LEADCHNL RV SENSING INTR AMPL: 5.5 mV
MDC IDC SET LEADCHNL RV PACING PULSEWIDTH: 0.4 ms
MDC IDC STAT BRADY AS VP PERCENT: 0.04 %
MDC IDC STAT BRADY AS VS PERCENT: 77.98 %
MDC IDC STAT BRADY RA PERCENT PACED: 21.99 %

## 2015-10-13 ENCOUNTER — Other Ambulatory Visit: Payer: Self-pay | Admitting: Family Medicine

## 2015-10-15 ENCOUNTER — Encounter: Payer: Self-pay | Admitting: Cardiology

## 2015-10-17 ENCOUNTER — Other Ambulatory Visit: Payer: Self-pay | Admitting: Family Medicine

## 2015-11-30 ENCOUNTER — Other Ambulatory Visit: Payer: Self-pay | Admitting: Cardiovascular Disease

## 2015-12-04 ENCOUNTER — Other Ambulatory Visit: Payer: Self-pay | Admitting: Internal Medicine

## 2015-12-28 ENCOUNTER — Telehealth: Payer: Self-pay | Admitting: Cardiology

## 2015-12-28 ENCOUNTER — Ambulatory Visit (INDEPENDENT_AMBULATORY_CARE_PROVIDER_SITE_OTHER): Payer: Medicare Other | Admitting: *Deleted

## 2015-12-28 DIAGNOSIS — I495 Sick sinus syndrome: Secondary | ICD-10-CM | POA: Diagnosis not present

## 2015-12-28 NOTE — Telephone Encounter (Signed)
LMOVM reminding pt to send remote transmission.   

## 2015-12-29 NOTE — Progress Notes (Signed)
Remote pacemaker transmission.   

## 2016-01-05 ENCOUNTER — Encounter: Payer: Self-pay | Admitting: Cardiology

## 2016-01-13 LAB — CUP PACEART REMOTE DEVICE CHECK
Battery Remaining Longevity: 88 mo
Brady Statistic AP VP Percent: 0.07 %
Brady Statistic AP VS Percent: 19.4 %
Brady Statistic AS VS Percent: 80.5 %
Brady Statistic RV Percent Paced: 0.11 %
Implantable Lead Implant Date: 20141208
Implantable Lead Location: 753859
Implantable Lead Model: 5076
Lead Channel Impedance Value: 399 Ohm
Lead Channel Impedance Value: 399 Ohm
Lead Channel Impedance Value: 418 Ohm
Lead Channel Pacing Threshold Amplitude: 0.5 V
Lead Channel Sensing Intrinsic Amplitude: 2 mV
Lead Channel Sensing Intrinsic Amplitude: 6.625 mV
Lead Channel Setting Pacing Amplitude: 2.5 V
Lead Channel Setting Sensing Sensitivity: 2 mV
MDC IDC LEAD IMPLANT DT: 20141208
MDC IDC LEAD LOCATION: 753860
MDC IDC MSMT BATTERY VOLTAGE: 3.01 V
MDC IDC MSMT LEADCHNL RA IMPEDANCE VALUE: 361 Ohm
MDC IDC MSMT LEADCHNL RA PACING THRESHOLD AMPLITUDE: 0.5 V
MDC IDC MSMT LEADCHNL RA PACING THRESHOLD PULSEWIDTH: 0.4 ms
MDC IDC MSMT LEADCHNL RA SENSING INTR AMPL: 2 mV
MDC IDC MSMT LEADCHNL RV PACING THRESHOLD PULSEWIDTH: 0.4 ms
MDC IDC MSMT LEADCHNL RV SENSING INTR AMPL: 6.625 mV
MDC IDC PG IMPLANT DT: 20141208
MDC IDC SESS DTM: 20171205222823
MDC IDC SET LEADCHNL RA PACING AMPLITUDE: 2 V
MDC IDC SET LEADCHNL RV PACING PULSEWIDTH: 0.4 ms
MDC IDC STAT BRADY AS VP PERCENT: 0.03 %
MDC IDC STAT BRADY RA PERCENT PACED: 19.43 %

## 2016-01-28 ENCOUNTER — Other Ambulatory Visit: Payer: Self-pay | Admitting: Internal Medicine

## 2016-01-28 ENCOUNTER — Encounter: Payer: Self-pay | Admitting: Internal Medicine

## 2016-01-28 ENCOUNTER — Ambulatory Visit (INDEPENDENT_AMBULATORY_CARE_PROVIDER_SITE_OTHER): Payer: Medicare Other | Admitting: Internal Medicine

## 2016-01-28 VITALS — BP 138/78 | HR 74 | Temp 98.3°F | Wt 211.0 lb

## 2016-01-28 DIAGNOSIS — J329 Chronic sinusitis, unspecified: Secondary | ICD-10-CM | POA: Diagnosis not present

## 2016-01-28 DIAGNOSIS — B9789 Other viral agents as the cause of diseases classified elsewhere: Secondary | ICD-10-CM

## 2016-01-28 DIAGNOSIS — J309 Allergic rhinitis, unspecified: Secondary | ICD-10-CM

## 2016-01-28 MED ORDER — MOMETASONE FUROATE 50 MCG/ACT NA SUSP: 2.0000 | Freq: Every day | NASAL | 1 refills | Status: DC

## 2016-01-28 MED ORDER — HYDROCODONE-HOMATROPINE 5-1.5 MG/5ML PO SYRP: 5.0000 mL | ORAL_SOLUTION | Freq: Three times a day (TID) | ORAL | 0 refills | Status: DC | PRN

## 2016-01-28 NOTE — Addendum Note (Signed)
Addended by: Lurlean Nanny on: 01/28/2016 02:28 PM   Modules accepted: Orders

## 2016-01-28 NOTE — Progress Notes (Signed)
HPI  Pt presents to the clinic today with c/o headache, facial pressure, nasal congestion, ear fullness and cough. This started 3 days ago. She is blowing yellow mucous out of her nose. She denies ear pain, decreased hearing or difficulty swallowing. The cough is productive of clear mucous. She denies fever, chills or body aches. She has tried Mucinex, Tylenol and Singulair with some relief. [place]oes have a history of allergies and remote asthma. She does have DM 2, last A1C 7.1%. She had her pneumonia vaccines but is not UTD on her flu vaccine. She has had sick contacts.   Review of Systems     Past Medical History:  Diagnosis Date  . Acid reflux disease   . Allergy   . Asthma    a. Remotely.  . Chronic combined systolic and diastolic CHF (congestive heart failure) ([place])    a. Felt related to AF. EF 45-50% by echo 12/2012.  Marland Kitchen CVA (cerebral vascular accident) ([place])    a. Thromboemoblic L MCA CVA 123456 with no residual deficit except mild occasional word finding. b. 123456 RICA/LICA by dopplers [id number].  Marland Kitchen GERD (gastroesophageal reflux disease)   . H/O medication noncompliance    a. Per PCP note 01/06/13: Previously refused anything greater than aspirin, refused treatment for DM, and often stopped hypertensive meds and lipid meds.  Marland Kitchen Hx of rheumatic fever    a. As a child.  . Hyperlipidemia   . Hypertension   . Kidney stones    a. Remotely.  . Obesity   . PAF (paroxysmal atrial fibrillation) ([place])    a. Dx 09/2012, chronicity unclear at that time. Initially refused Xarelto due to risk of bleeding. b. Sustained CVA 12/2012 - agreeable to taking Eliquis. Tachybrady syndrome during that admission s/p Medtronic PPM implantation;  c. Failed flecainide, sotalol, amiodarone (itching), and propafenone.  . Tachy-brady syndrome ([place])    a. AF RVR with pauses >5sec requiring Medtronic PPM 12/2012.  . Tubular adenoma of colon 05/2008  . Type 2 diabetes mellitus ([facility])     Family History  Problem  Relation Age of Onset  . Stroke Mother   . Diabetes Mother     Social History   Social History  . Marital status: Married    Spouse name: N/A  . Number of children: 2  . Years of education: N/A   Occupational History  . cosmetologist Self Employed   Social History Main Topics  . Smoking status: Former Smoker    Packs/day: 2.00    Types: Cigarettes    Quit date: 01/24/1968  . Smokeless tobacco: Never Used  . Alcohol use 0.6 oz/week    1 Glasses of wine per week     Comment: rare use  . Drug use: No  . Sexual activity: Yes   Other Topics Concern  . Not on file   Social History Narrative   Lives in [place] with her husband.  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    Allergies  Allergen Reactions  . Amiodarone Itching  . Crestor [Rosuvastatin]     myalgia  . Fish Allergy Swelling    Shell fish  . Iodine Itching    itching  . Lipitor [Atorvastatin]     myalgias  . Penicillins Hives and Itching  . Ace Inhibitors Cough  . Lidocaine Hcl Palpitations    ALL CAINES given during dental procedures     Constitutional: Positive headache, fatigue. Denies fever or abrupt weight changes.  HEENT:  Positive facial  pressure, nasal congestion and sore throat. Denies eye redness, ear pain, ringing in the ears, wax buildup, runny nose or bloody nose. Respiratory: Positive cough. Denies difficulty breathing or shortness of breath.  Cardiovascular: Denies chest pain, chest tightness, palpitations or swelling in the hands or feet.   No other specific complaints in a complete review of systems (except as listed in HPI above).  Objective:   BP 138/78   Pulse 74   Temp 98.3 F (36.8 C) (Oral)   Wt 211 lb (95.7 kg)   SpO2 97%   BMI 38.59 kg/m   General: Appears her stated age, in NAD. HEENT: Head: normal shape and size, mild frontal sinus tenderness noted; Eyes: sclera white, no icterus, conjunctiva pink; Ears: Tm's gray and intact, normal light reflex, + effusion on  the right; Nose: mucosa pink and moist, septum midline; Throat/Mouth: + PND. Teeth present, mucosa pink and moist, no exudate noted, no lesions or ulcerations noted.  Neck:  No adenopathy noted.  Pulmonary/Chest: Normal effort and positive vesicular breath sounds. No respiratory distress. No wheezes, rales or ronchi noted.       Assessment & Plan:   Viral sinusitis  Can use a Neti Pot which can be purchased from your local drug store. eRx for Nasonex daily x 1 week Continue Singulair RX for Hycodan for cough  RTC as needed or if symptoms persist. Webb Silversmith, NP

## 2016-01-28 NOTE — Patient Instructions (Signed)

## 2016-01-31 ENCOUNTER — Telehealth: Payer: Self-pay | Admitting: *Deleted

## 2016-01-31 MED ORDER — DOXYCYCLINE HYCLATE 100 MG PO TABS: 100.0000 mg | ORAL_TABLET | Freq: Two times a day (BID) | ORAL | 0 refills | Status: DC

## 2016-01-31 NOTE — Addendum Note (Signed)
Addended by: Jearld Fenton on: 01/31/2016 11:14 AM   Modules accepted: Orders

## 2016-01-31 NOTE — Telephone Encounter (Signed)
Pt reports that the cough syrup is not helping with her sinus Sx---I advised pt that this medication is not helping for sinus Sx..the patient wanted to know if she can get Rx for another cough syrup that Dr Lorelei Pont used to give...please advise

## 2016-01-31 NOTE — Telephone Encounter (Signed)
Doxycycline sent to [facility]

## 2016-01-31 NOTE — Telephone Encounter (Signed)
Patient left a voicemail stating that she was seen Friday and advised to take Singular and cough medication. Patient stated that she is not any better and spitting up and getting green mucus out of her head.  Patient stated that she needs something else because she is not feeling any better.

## 2016-01-31 NOTE — Telephone Encounter (Signed)
She had Hycodan susp with 24 doses. Given on Friday.   I checked her chart, and I have given her Robitussin with codeine before, but what she has is quite a bit stronger.   Try to be patient and let her body heal.

## 2016-01-31 NOTE — Telephone Encounter (Signed)
Patient left a voicemail stating that she got notice of Doxycycline ready for pickup at the pharmacy. Patient stated that she needs some of the cough medication because she is out of that also.

## 2016-01-31 NOTE — Telephone Encounter (Signed)
She should have only taken 45 ml out of the 120 ml, os if she is out, she has been taking too much. This will not be refilled, it is a controlled substance. She can try Delsym on Nyquil OTC.

## 2016-02-01 NOTE — Telephone Encounter (Signed)
Ms. Carrazana notified as instructed by telephone.

## 2016-03-13 ENCOUNTER — Other Ambulatory Visit: Payer: Medicare Other

## 2016-03-13 ENCOUNTER — Other Ambulatory Visit (INDEPENDENT_AMBULATORY_CARE_PROVIDER_SITE_OTHER): Payer: Medicare Other

## 2016-03-13 ENCOUNTER — Ambulatory Visit: Payer: Medicare Other

## 2016-03-13 DIAGNOSIS — R5383 Other fatigue: Secondary | ICD-10-CM

## 2016-03-13 DIAGNOSIS — E119 Type 2 diabetes mellitus without complications: Secondary | ICD-10-CM | POA: Diagnosis not present

## 2016-03-13 DIAGNOSIS — Z79899 Other long term (current) drug therapy: Secondary | ICD-10-CM | POA: Diagnosis not present

## 2016-03-13 DIAGNOSIS — E784 Other hyperlipidemia: Secondary | ICD-10-CM | POA: Diagnosis not present

## 2016-03-13 DIAGNOSIS — Z1159 Encounter for screening for other viral diseases: Secondary | ICD-10-CM | POA: Diagnosis not present

## 2016-03-13 DIAGNOSIS — E7849 Other hyperlipidemia: Secondary | ICD-10-CM

## 2016-03-13 LAB — BASIC METABOLIC PANEL
BUN: 13 mg/dL (ref 6–23)
CHLORIDE: 105 meq/L (ref 96–112)
CO2: 26 meq/L (ref 19–32)
Calcium: 9.4 mg/dL (ref 8.4–10.5)
Creatinine, Ser: 0.85 mg/dL (ref 0.40–1.20)
GFR: 70.15 mL/min (ref >60.00)
Glucose, Bld: 166 mg/dL — ABNORMAL HIGH (ref 70–99)
POTASSIUM: 4.2 meq/L (ref 3.5–5.1)
Sodium: 139 mEq/L (ref 135–145)

## 2016-03-13 LAB — HEPATIC FUNCTION PANEL
ALT: 25 U/L (ref 0–35)
AST: 17 U/L (ref 0–37)
Albumin: 4.3 g/dL (ref 3.5–5.2)
Alkaline Phosphatase: 82 U/L (ref 39–117)
BILIRUBIN DIRECT: 0.2 mg/dL (ref 0.0–0.3)
TOTAL PROTEIN: 6.9 g/dL (ref 6.0–8.3)
Total Bilirubin: 0.8 mg/dL (ref 0.2–1.2)

## 2016-03-13 LAB — LIPID PANEL
CHOL/HDL RATIO: 4
CHOLESTEROL: 168 mg/dL (ref 0–200)
HDL: 39 mg/dL — AB (ref >39.00)
LDL Cholesterol: 90 mg/dL (ref 0–99)
NonHDL: 129.04
Triglycerides: 196 mg/dL — ABNORMAL HIGH (ref 0.0–149.0)
VLDL: 39.2 mg/dL (ref 0.0–40.0)

## 2016-03-13 LAB — MICROALBUMIN / CREATININE URINE RATIO
CREATININE, U: 73 mg/dL
MICROALB UR: 1.8 mg/dL (ref 0.0–1.9)
MICROALB/CREAT RATIO: 2.5 mg/g (ref 0.0–30.0)

## 2016-03-13 LAB — CBC WITH DIFFERENTIAL/PLATELET
BASOS PCT: 1.1 % (ref 0.0–3.0)
Basophils Absolute: 0.1 10*3/uL (ref 0.0–0.1)
EOS PCT: 2.3 % (ref 0.0–5.0)
Eosinophils Absolute: 0.2 10*3/uL (ref 0.0–0.7)
HEMATOCRIT: 42 % (ref 36.0–46.0)
HEMOGLOBIN: 14.2 g/dL (ref 12.0–15.0)
LYMPHS PCT: 28.1 % (ref 12.0–46.0)
Lymphs Abs: 1.9 10*3/uL (ref 0.7–4.0)
MCHC: 33.8 g/dL (ref 30.0–36.0)
MCV: 90.8 fl (ref 78.0–100.0)
Monocytes Absolute: 0.6 10*3/uL (ref 0.1–1.0)
Monocytes Relative: 8.9 % (ref 3.0–12.0)
NEUTROS ABS: 4.1 10*3/uL (ref 1.4–7.7)
Neutrophils Relative %: 59.6 % (ref 43.0–77.0)
Platelets: 242 10*3/uL (ref 150.0–400.0)
RBC: 4.63 Mil/uL (ref 3.87–5.11)
RDW: 12.9 % (ref 11.5–15.5)
WBC: 6.9 10*3/uL (ref 4.0–10.5)

## 2016-03-13 LAB — TSH: TSH: 1.74 u[IU]/mL (ref 0.35–4.50)

## 2016-03-13 LAB — HEMOGLOBIN A1C: Hgb A1c MFr Bld: 7.7 % — ABNORMAL HIGH (ref 4.6–6.5)

## 2016-03-14 ENCOUNTER — Other Ambulatory Visit: Payer: Self-pay | Admitting: Internal Medicine

## 2016-03-14 DIAGNOSIS — J309 Allergic rhinitis, unspecified: Secondary | ICD-10-CM

## 2016-03-14 LAB — HEPATITIS C ANTIBODY: HCV Ab: NEGATIVE

## 2016-03-14 NOTE — Telephone Encounter (Signed)
Last refilled by Baity--please advise if okay to refill

## 2016-03-14 NOTE — Telephone Encounter (Signed)
For some reason my refill did not go through? Can you help or call in for me?

## 2016-03-15 MED ORDER — MONTELUKAST SODIUM 10 MG PO TABS: 10.0000 mg | ORAL_TABLET | Freq: Every day | ORAL | 2 refills | Status: DC

## 2016-03-15 NOTE — Addendum Note (Signed)
Addended by: Carter Kitten on: 03/15/2016 08:50 AM   Modules accepted: Orders

## 2016-03-20 ENCOUNTER — Encounter: Payer: Self-pay | Admitting: Family Medicine

## 2016-03-20 ENCOUNTER — Ambulatory Visit (INDEPENDENT_AMBULATORY_CARE_PROVIDER_SITE_OTHER): Payer: Medicare Other | Admitting: Family Medicine

## 2016-03-20 ENCOUNTER — Ambulatory Visit: Payer: Medicare Other

## 2016-03-20 VITALS — BP 140/84 | HR 84 | Temp 99.1°F | Ht 62.0 in | Wt 205.0 lb

## 2016-03-20 DIAGNOSIS — Z0000 Encounter for general adult medical examination without abnormal findings: Secondary | ICD-10-CM | POA: Diagnosis not present

## 2016-03-20 DIAGNOSIS — Z23 Encounter for immunization: Secondary | ICD-10-CM

## 2016-03-20 NOTE — Progress Notes (Signed)
Pre visit review using our clinic review tool, if applicable. No additional management support is needed unless otherwise documented below in the visit note. 

## 2016-03-20 NOTE — Progress Notes (Signed)
Dr. Frederico Hamman T. Crisanto Nied, MD, [facility] [place], [address] Phone: [phone] Fax: [phone]  03/20/2016  Patient: Nina Carpenter, MRN: [id number], DOB: 01-22-1946, 71 y.o.  Primary Physician:  [name], MD   Chief Complaint  Patient presents with  . Medicare Wellness   Subjective:   LOZA PRELL is a 71 y.o. pleasant patient who presents for a medicare wellness examination:  Health Maintenance Summary Reviewed and updated, unless pt declines services.  Tobacco History Reviewed. Non-smoker Alcohol: No concerns, no excessive use Exercise Habits: 2-3 days a week gardening, rec at least 30 mins 5 times a week STD concerns: none Drug Use: None Birth control method: n/a Menses regular: n/a Lumps or breast concerns: no Breast Cancer Family History: no  Health Maintenance  Topic Date Due  . TETANUS/TDAP  07/27/1964  . DEXA SCAN  07/28/2010  . FOOT EXAM  05/04/2015  . INFLUENZA VACCINE  08/24/2015  . OPHTHALMOLOGY EXAM  06/27/2016  . HEMOGLOBIN A1C  09/10/2016  . MAMMOGRAM  05/30/2017  . COLONOSCOPY  06/16/2018  . Hepatitis C Screening  Completed  . PNA vac Low Risk Adult  Completed   Immunization History  Administered Date(s) Administered  . Influenza Split 11/13/2011  . Influenza,inj,Quad PF,36+ Mos 11/18/2012, 10/06/2013, 12/23/2014  . Pneumococcal Conjugate-13 10/06/2013  . Pneumococcal Polysaccharide-23 10/11/2010    Patient Active Problem List   Diagnosis Date Noted  . CVA (cerebral vascular accident) ([place]) 01/06/2013    Priority: High  . Atrial fibrillation ([place]) 12/29/2012    Priority: High  . Type 2 diabetes mellitus with vascular disease ([place]) 05/04/2008    Priority: High  . Cardiac pacemaker in situ 01/06/2013    Priority: Medium  . History of noncompliance with medical treatment 01/06/2013    Priority: Medium  . Tachy-brady syndrome ([place]) 12/29/2012    Priority: Medium    . Hx of rheumatic fever 08/07/2011    Priority: Medium  . NEURALGIA, TRIGEMINAL 01/26/2010    Priority: Medium  . Hyperlipidemia LDL goal <70 05/04/2008    Priority: Medium  . Essential hypertension 05/04/2008    Priority: Medium  . ASTHMA 05/04/2008    Priority: Medium  . OSA (obstructive sleep apnea) 05/25/2014  . Chronic anticoagulation- Eliquis 01/27/2013  . ALLERGIC RHINITIS 05/04/2008  . GERD 05/04/2008  . POSTMENOPAUSAL STATUS 05/04/2008   Past Medical History:  Diagnosis Date  . Acid reflux disease   . Allergy   . Asthma    a. Remotely.  . Chronic combined systolic and diastolic CHF (congestive heart failure) ([place])    a. Felt related to AF. EF 45-50% by echo 12/2012.  Marland Kitchen CVA (cerebral vascular accident) ([place])    a. Thromboemoblic L MCA CVA 72/2575 with no residual deficit except mild occasional word finding. b. 0-51% RICA/LICA by dopplers 83/3/58.  Marland Kitchen GERD (gastroesophageal reflux disease)   . H/O medication noncompliance    a. Per PCP note 01/06/13: Previously refused anything greater than aspirin, refused treatment for DM, and often stopped hypertensive meds and lipid meds.  Marland Kitchen Hx of rheumatic fever    a. As a child.  . Hyperlipidemia   . Hypertension   . Kidney stones    a. Remotely.  . Obesity   . PAF (paroxysmal atrial fibrillation) ([place])    a. Dx 09/2012, chronicity unclear at that time. Initially refused Xarelto due to risk of bleeding. b. Sustained CVA 12/2012 - agreeable to taking Eliquis. Tachybrady  syndrome during that admission s/p Medtronic PPM implantation;  c. Failed flecainide, sotalol, amiodarone (itching), and propafenone.  . Tachy-brady syndrome ([place])    a. AF RVR with pauses >5sec requiring Medtronic PPM 12/2012.  . Tubular adenoma of colon 05/2008  . Type 2 diabetes mellitus ([place])    Past Surgical History:  Procedure Laterality Date  . CHOLECYSTECTOMY    . DESTRUCTION TRIGEMINAL NERVE VIA NEUROLYTIC AGENT  NOv _0    Gamma Knife   . INSERT /  REPLACE / REMOVE PACEMAKER    . PACEMAKER INSERTION  12-30-12   MDT dual chamber pacemaker implanted by Dr Caryl Comes for tachy-brady syndrome  . PERMANENT PACEMAKER INSERTION N/A 12/30/2012   Procedure: PERMANENT PACEMAKER INSERTION;  Surgeon: Deboraha Sprang, MD;  Location: [facility] CATH LAB;  Service: Cardiovascular;  Laterality: N/A;  . TUBAL LIGATION     ectopic preganacy    Social History   Social History  . Marital status: Married    Spouse name: N/A  . Number of children: 2  . Years of education: N/A   Occupational History  . cosmetologist Self Employed   Social History Main Topics  . Smoking status: Former Smoker    Packs/day: 2.00    Types: Cigarettes    Quit date: 01/24/1968  . Smokeless tobacco: Never Used  . Alcohol use 0.6 oz/week    1 Glasses of wine per week     Comment: rare use  . Drug use: No  . Sexual activity: Yes   Other Topics Concern  . Not on file   Social History Narrative   Lives in [place] with her husband.  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   Family History  Problem Relation Age of Onset  . Stroke Mother   . Diabetes Mother    Allergies  Allergen Reactions  . Amiodarone Itching  . Crestor [Rosuvastatin]     myalgia  . Fish Allergy Swelling    Shell fish  . Iodine Itching    itching  . Lipitor [Atorvastatin]     myalgias  . Penicillins Hives and Itching  . Ace Inhibitors Cough  . Lidocaine Hcl Palpitations    ALL CAINES given during dental procedures    Medication list has been reviewed and updated.   General: Denies fever, chills, sweats. No significant weight loss. Eyes: Denies blurring,significant itching ENT: Denies earache, sore throat, and hoarseness.  Cardiovascular: Denies chest pains, palpitations, dyspnea on exertion,  Respiratory: Denies cough, dyspnea at rest,wheeezing Breast: no concerns about lumps GI: Denies nausea, vomiting, diarrhea, constipation, change in bowel habits, abdominal pain, melena,  hematochezia GU: Denies dysuria, hematuria, urinary hesitancy, nocturia, denies STD risk, no concerns about discharge Musculoskeletal: Denies back pain, joint pain Derm: Denies rash, itching Neuro: Denies  paresthesias, frequent falls, frequent headaches Psych: Denies depression, anxiety Endocrine: Denies cold intolerance, heat intolerance, polydipsia Heme: Denies enlarged lymph nodes Allergy: No hayfever  Objective:   BP 140/84   Pulse 84   Temp 99.1 F (37.3 C) (Oral)   Ht _1  (1.575 m)   Wt 205 lb (93 kg)   BMI 37.49 kg/m   The patient completed a fall screen and PHQ-2 and PHQ-9 if necessary, which is documented in the EHR. The CMA/LPN/RN who assisted the patient verbally completed with them and documented results in [facility] EHR.   Hearing Screening   Method: Audiometry   _2  _3  _4  _5  _6  _7  _8  _9  _10   Right ear:   20 20  25  40    Left ear:   _0 0    Vision Screening Comments: Wears Glasses-Eye Exam at [facility] in 2017  GEN: well developed, well nourished, no acute distress Eyes: conjunctiva and lids normal, PERRLA, EOMI ENT: TM clear, nares clear, oral exam WNL Neck: supple, no lymphadenopathy, no thyromegaly, no JVD Pulm: clear to auscultation and percussion, respiratory effort normal CV: regular rate and rhythm, S1-S2, no murmur, rub or gallop, no bruits Chest: no scars, masses, no lumps BREAST: breast exam declined GI: soft, non-tender; no hepatosplenomegaly, masses; active bowel sounds all quadrants GU: GU exam declined Lymph: no cervical, axillary or inguinal adenopathy MSK: gait normal, muscle tone and strength WNL, no joint swelling, effusions, discoloration, crepitus  SKIN: clear, good turgor, color WNL, no rashes, lesions, or ulcerations Neuro: normal mental status, normal strength, sensation, and motion Psych: alert; oriented to person, place and time, normally interactive and not anxious or depressed in  appearance.   All labs reviewed with patient. Lipids:    Component Value Date/Time   CHOL 168 03/13/2016 1025   TRIG 196.0 (H) 03/13/2016 1025   HDL 39.00 (L) 03/13/2016 1025   LDLDIRECT 178.0 11/18/2012 1049   VLDL 39.2 03/13/2016 1025   CHOLHDL 4 03/13/2016 1025   CBC: CBC Latest Ref Rng & Units 03/13/2016 03/10/2015 10/06/2013  WBC 4.0 - 10.5 K/uL 6.9 6.0 7.1  Hemoglobin 12.0 - 15.0 g/dL 14.2 14.3 14.6  Hematocrit 36.0 - 46.0 % 42.0 42.3 43.9  Platelets 150.0 - 400.0 K/uL 242.0 241.0 562.5    Basic Metabolic Panel:    Component Value Date/Time   NA 139 03/13/2016 1025   NA 139 05/09/2013 2023   K 4.2 03/13/2016 1025   K 3.9 05/09/2013 2023   CL 105 03/13/2016 1025   CL 103 05/09/2013 2023   CO2 26 03/13/2016 1025   CO2 29 05/09/2013 2023   BUN 13 03/13/2016 1025   BUN 14 05/09/2013 2023   CREATININE 0.85 03/13/2016 1025   CREATININE 0.97 05/09/2013 2023   GLUCOSE 166 (H) 03/13/2016 1025   GLUCOSE 141 (H) 05/09/2013 2023   CALCIUM 9.4 03/13/2016 1025   CALCIUM 9.5 05/09/2013 2023   Hepatic Function Latest Ref Rng & Units 03/13/2016 03/10/2015 10/06/2013  Total Protein 6.0 - 8.3 g/dL 6.9 7.1 7.3  Albumin 3.5 - 5.2 g/dL 4.3 4.4 4.2  AST 0 - 37 U/L _1 ALT 0 - 35 U/L _2 Alk Phosphatase 39 - 117 U/L 82 76 72  Total Bilirubin 0.2 - 1.2 mg/dL 0.8 0.7 0.6  Bilirubin, Direct 0.0 - 0.3 mg/dL 0.2 0.1 0.0    Lab Results  Component Value Date   TSH 1.74 03/13/2016    No results found.  Assessment and Plan:   Healthcare maintenance  Health Maintenance Exam: The patient's preventative maintenance and recommended screening tests for an annual wellness exam were reviewed in full today. Brought up to date unless services declined.  Counselled on the importance of diet, exercise, and its role in overall health and mortality. The patient's FH and SH was reviewed, including their home life, tobacco status, and drug and alcohol status.  Follow-up in 1 year for  physical exam or additional follow-up below.  I have personally reviewed the Medicare Annual Wellness questionnaire and have noted 1. The patient's medical and social history 2. Their use of alcohol, tobacco or illicit drugs 3. Their current medications and supplements 4. The patient's functional  ability including ADL's, fall risks, home safety risks and hearing or visual             impairment. 5. Diet and physical activities 6. Evidence for depression or mood disorders 7. Reviewed Updated provider list, see scanned forms and [facility] Snapshot.   The patients weight, height, BMI and visual acuity have been recorded in the chart I have made referrals, counseling and provided education to the patient based review of the above and I have provided the pt with a written personalized care plan for preventive services.  I have provided the patient with a copy of your personalized plan for preventive services. Instructed to take the time to review along with their updated medication list.  Follow-up: Return in about 6 months (around 09/17/2016). Or follow-up in 1 year unless noted.  Medications Discontinued During This Encounter  Medication Reason  . doxycycline (VIBRA-TABS) 100 MG tablet Completed Course  . HYDROcodone-homatropine (HYCODAN) 5-1.5 MG/5ML syrup Completed Course   Signed,  Frederico Hamman T. Caydon Feasel, MD   Allergies as of 03/20/2016      Reactions   Amiodarone Itching   Crestor [rosuvastatin]    myalgia   Fish Allergy Swelling   Shell fish   Iodine Itching   itching   Lipitor [atorvastatin]    myalgias   Penicillins Hives, Itching   Ace Inhibitors Cough   Lidocaine Hcl Palpitations   ALL CAINES given during dental procedures      Medication List       Accurate as of 03/20/16  3:26 PM. Always use your most recent med list.          ELIQUIS 5 MG Tabs tablet Generic drug:  apixaban take 1 tablet by mouth twice a day   glipiZIDE 5 MG 24 hr tablet Commonly known as:   GLUCOTROL XL take 1 tablet by mouth every morning BEFORE BREAKFAST   losartan 100 MG tablet Commonly known as:  COZAAR take 1 tablet by mouth once daily   metoprolol 100 MG tablet Commonly known as:  LOPRESSOR Take 0.5 tablets (50 mg total) by mouth 2 (two) times daily.   mometasone 50 MCG/ACT nasal spray Commonly known as:  NASONEX Place 2 sprays into the nose daily.   montelukast 10 MG tablet Commonly known as:  SINGULAIR Take 1 tablet (10 mg total) by mouth at bedtime.   pantoprazole 40 MG tablet Commonly known as:  PROTONIX Take 40 mg by mouth daily as needed (for acid reflux).   simvastatin 80 MG tablet Commonly known as:  ZOCOR take 1 tablet by mouth once daily

## 2016-03-20 NOTE — Addendum Note (Signed)
Addended by: Marchia Bond on: 03/20/2016 03:40 PM   Modules accepted: Orders

## 2016-03-29 ENCOUNTER — Other Ambulatory Visit: Payer: Self-pay | Admitting: Family Medicine

## 2016-04-03 ENCOUNTER — Other Ambulatory Visit: Payer: Self-pay | Admitting: Family Medicine

## 2016-04-03 ENCOUNTER — Other Ambulatory Visit: Payer: Self-pay | Admitting: Internal Medicine

## 2016-04-04 ENCOUNTER — Encounter: Payer: Self-pay | Admitting: Family Medicine

## 2016-04-04 ENCOUNTER — Telehealth: Payer: Self-pay | Admitting: Family Medicine

## 2016-04-04 ENCOUNTER — Ambulatory Visit (INDEPENDENT_AMBULATORY_CARE_PROVIDER_SITE_OTHER): Payer: Medicare Other | Admitting: Family Medicine

## 2016-04-04 VITALS — BP 140/78 | HR 72 | Temp 98.3°F | Resp 18 | Wt 208.0 lb

## 2016-04-04 DIAGNOSIS — J069 Acute upper respiratory infection, unspecified: Secondary | ICD-10-CM | POA: Diagnosis not present

## 2016-04-04 MED ORDER — HYDROCOD POLST-CPM POLST ER 10-8 MG/5ML PO SUER: 5.0000 mL | Freq: Two times a day (BID) | ORAL | 0 refills | Status: DC | PRN

## 2016-04-04 NOTE — Progress Notes (Signed)
SUBJECTIVE:  Nina Carpenter is a 71 y.o. female pt of Dr. Lorelei Pont, new to me,  who complains of congestion, sore throat and productive cough for 4 days. She denies a history of anorexia, chest pain and chills and admits to a history of asthma. Patient denies smoke cigarettes.   Current Outpatient Prescriptions on File Prior to Visit  Medication Sig Dispense Refill  . ELIQUIS 5 MG TABS tablet take 1 tablet by mouth twice a day 60 tablet 6  . glipiZIDE (GLUCOTROL XL) 5 MG 24 hr tablet take 1 tablet by mouth every morning BEFORE BREAKFAST 90 tablet 1  . losartan (COZAAR) 100 MG tablet take 1 tablet by mouth once daily 90 tablet 3  . metoprolol (LOPRESSOR) 100 MG tablet take 1 tablet by mouth twice a day 60 tablet 5  . mometasone (NASONEX) 50 MCG/ACT nasal spray Place 2 sprays into the nose daily. 17 g 1  . montelukast (SINGULAIR) 10 MG tablet Take 1 tablet (10 mg total) by mouth at bedtime. 30 tablet 2  . pantoprazole (PROTONIX) 40 MG tablet Take 40 mg by mouth daily as needed (for acid reflux).    . simvastatin (ZOCOR) 80 MG tablet take 1 tablet by mouth once daily 30 tablet 3   No current facility-administered medications on file prior to visit.     Allergies  Allergen Reactions  . Amiodarone Itching  . Crestor [Rosuvastatin]     myalgia  . Fish Allergy Swelling    Shell fish  . Iodine Itching    itching  . Lipitor [Atorvastatin]     myalgias  . Penicillins Hives and Itching  . Ace Inhibitors Cough  . Lidocaine Hcl Palpitations    ALL CAINES given during dental procedures    Past Medical History:  Diagnosis Date  . Acid reflux disease   . Allergy   . Asthma    a. Remotely.  . Chronic combined systolic and diastolic CHF (congestive heart failure) ([place])    a. Felt related to AF. EF 45-50% by echo 12/2012.  Marland Kitchen CVA (cerebral vascular accident) ([place])    a. Thromboemoblic L MCA CVA 99/8338 with no residual deficit except mild occasional word finding. b. 2-50% RICA/LICA by  dopplers 53/9/76.  Marland Kitchen GERD (gastroesophageal reflux disease)   . H/O medication noncompliance    a. Per PCP note 01/06/13: Previously refused anything greater than aspirin, refused treatment for DM, and often stopped hypertensive meds and lipid meds.  Marland Kitchen Hx of rheumatic fever    a. As a child.  . Hyperlipidemia   . Hypertension   . Kidney stones    a. Remotely.  . Obesity   . PAF (paroxysmal atrial fibrillation) ([place])    a. Dx 09/2012, chronicity unclear at that time. Initially refused Xarelto due to risk of bleeding. b. Sustained CVA 12/2012 - agreeable to taking Eliquis. Tachybrady syndrome during that admission s/p Medtronic PPM implantation;  c. Failed flecainide, sotalol, amiodarone (itching), and propafenone.  . Tachy-brady syndrome ([place])    a. AF RVR with pauses >5sec requiring Medtronic PPM 12/2012.  . Tubular adenoma of colon 05/2008  . Type 2 diabetes mellitus ([place])     Past Surgical History:  Procedure Laterality Date  . CHOLECYSTECTOMY    . DESTRUCTION TRIGEMINAL NERVE VIA NEUROLYTIC AGENT  NOv @013    Gamma Knife   . INSERT / REPLACE / REMOVE PACEMAKER    . PACEMAKER INSERTION  12-30-12   MDT dual chamber pacemaker implanted by Dr Caryl Comes for  tachy-brady syndrome  . PERMANENT PACEMAKER INSERTION N/A 12/30/2012   Procedure: PERMANENT PACEMAKER INSERTION;  Surgeon: Deboraha Sprang, MD;  Location: [facility] CATH LAB;  Service: Cardiovascular;  Laterality: N/A;  . TUBAL LIGATION     ectopic preganacy     Family History  Problem Relation Age of Onset  . Stroke Mother   . Diabetes Mother     Social History   Social History  . Marital status: Married    Spouse name: N/A  . Number of children: 2  . Years of education: N/A   Occupational History  . cosmetologist Self Employed   Social History Main Topics  . Smoking status: Former Smoker    Packs/day: 2.00    Types: Cigarettes    Quit date: 01/24/1968  . Smokeless tobacco: Never Used  . Alcohol use 0.6 oz/week    1 Glasses of  wine per week     Comment: rare use  . Drug use: No  . Sexual activity: Yes   Other Topics Concern  . Not on file   Social History Narrative   Lives in [place] with her husband.  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   The PMH, PSH, Social History, Family History, Medications, and allergies have been reviewed in [facility], and have been updated if relevant.  OBJECTIVE: BP 140/78 (BP Location: Left Arm, Patient Position: Sitting, Cuff Size: Large)   Pulse 72   Temp 98.3 F (36.8 C) (Oral)   Resp 18   Wt 208 lb (94.3 kg)   SpO2 98%   BMI 38.04 kg/m   She appears well, vital signs are as noted. Ears normal.  Throat and pharynx normal.  Neck supple. No adenopathy in the neck. Nose is congested. Sinuses non tender. The chest is clear, without wheezes or rales.  ASSESSMENT:  viral upper respiratory illness  PLAN: Symptomatic therapy suggested: push fluids, rest and return office visit prn if symptoms persist or worsen. Lack of antibiotic effectiveness discussed with her. Call or return to clinic prn if these symptoms worsen or fail to improve as anticipated.

## 2016-04-04 NOTE — Telephone Encounter (Signed)
Patient Name: Nina Carpenter  DOB: 07/14/1945    Initial Comment Caller states, she needs to talk to someone - she is requesting an rx. - cold symptoms. - cough, wheezing.    Nurse Assessment  Nurse: Leilani Merl, RN, Heather Date/Time (Eastern Time): 04/04/2016 8:44:59 AM  Confirm and document reason for call. If symptomatic, describe symptoms. ---Caller states, she needs to talk to someone - she is requesting an rx. - cold symptoms. - cough, wheezing that started on Friday.  Does the patient have any new or worsening symptoms? ---Yes  Will a triage be completed? ---Yes  Related visit to physician within the last 2 weeks? ---No  Does the PT have any chronic conditions? (i.e. diabetes, asthma, etc.) ---Yes  List chronic conditions. ---See MR  Is this a behavioral health or substance abuse call? ---No     Guidelines    Guideline Title Affirmed Question Affirmed Notes  Cough - Acute Productive Wheezing is present    Final Disposition User   See Physician within 4 Hours (or PCP triage) Leilani Merl, RN, Heather    Comments  Appt with Dr. Deborra Medina at 11:45 am today.   Referrals  REFERRED TO PCP OFFICE   Disagree/Comply: Comply

## 2016-04-04 NOTE — Patient Instructions (Signed)
Great to see you. Your lungs sound great.  Keep taking tylenol, mucinex and singulair.  Tussionex for severe cough.

## 2016-04-04 NOTE — Progress Notes (Signed)
Pre visit review using our clinic review tool, if applicable. No additional management support is needed unless otherwise documented below in the visit note. 

## 2016-04-04 NOTE — Telephone Encounter (Signed)
Pt has appt 04/04/16 at 11:45 with Dr Deborra Medina.

## 2016-04-11 ENCOUNTER — Other Ambulatory Visit: Payer: Self-pay | Admitting: Family Medicine

## 2016-04-18 ENCOUNTER — Other Ambulatory Visit: Payer: Self-pay | Admitting: Family Medicine

## 2016-05-09 ENCOUNTER — Ambulatory Visit (INDEPENDENT_AMBULATORY_CARE_PROVIDER_SITE_OTHER): Payer: Medicare Other | Admitting: Internal Medicine

## 2016-05-09 ENCOUNTER — Encounter: Payer: Self-pay | Admitting: Internal Medicine

## 2016-05-09 VITALS — BP 170/80 | HR 78 | Ht 62.0 in | Wt 207.5 lb

## 2016-05-09 DIAGNOSIS — Z950 Presence of cardiac pacemaker: Secondary | ICD-10-CM

## 2016-05-09 DIAGNOSIS — I480 Paroxysmal atrial fibrillation: Secondary | ICD-10-CM

## 2016-05-09 DIAGNOSIS — I495 Sick sinus syndrome: Secondary | ICD-10-CM

## 2016-05-09 NOTE — Progress Notes (Signed)
Patient Care Team: [name], MD as PCP - General   HPI  Nina Carpenter is a 71 y.o. female Seen in follow-up for atrial fibrillation. She underwent PVI at [facility] 2015 There is been a marked diminution in atrial fibrillation symptoms and by device interrogation burden as well. She does have some nonsustained atrial tachycardia/slow flutter. 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 Acid reflux disease   . Allergy   . Asthma    a. Remotely.  . Chronic combined systolic and diastolic CHF (congestive heart failure) ([place])    a. Felt related to AF. EF 45-50% by echo 12/2012.  Marland Kitchen CVA (cerebral vascular accident) ([place])    a. Thromboemoblic L MCA CVA 66/2947 with no residual deficit except mild occasional word finding. b. 6-54% RICA/LICA by dopplers 65/0/35.  Marland Kitchen GERD (gastroesophageal reflux disease)   . H/O medication noncompliance    a. Per PCP note 01/06/13: Previously refused anything greater than aspirin, refused treatment for DM, and often stopped hypertensive meds and lipid meds.  Marland Kitchen Hx of rheumatic fever    a. As a child.  . Hyperlipidemia   . Hypertension   . Kidney stones    a. Remotely.  . Obesity   . PAF (paroxysmal atrial fibrillation) ([place])    a. Dx 09/2012, chronicity unclear at that time. Initially refused Xarelto due to risk of bleeding. b. Sustained CVA 12/2012 - agreeable to taking Eliquis. Tachybrady syndrome during that admission s/p Medtronic PPM implantation;  c. Failed flecainide, sotalol, amiodarone (itching), and propafenone.  . Tachy-brady syndrome ([place])    a. AF RVR with pauses >5sec  requiring Medtronic PPM 12/2012.  . Tubular adenoma of colon 05/2008  . Type 2 diabetes mellitus ([place])     Past Surgical History:  Procedure Laterality Date  . CHOLECYSTECTOMY    . DESTRUCTION TRIGEMINAL NERVE VIA NEUROLYTIC AGENT  NOv @013    Gamma Knife   . INSERT / REPLACE / REMOVE PACEMAKER    . PACEMAKER INSERTION  12-30-12   MDT dual chamber pacemaker implanted by Dr Caryl Comes for tachy-brady syndrome  . PERMANENT PACEMAKER INSERTION N/A 12/30/2012   Procedure: PERMANENT PACEMAKER INSERTION;  Surgeon: Deboraha Sprang, MD;  Location: [facility] CATH LAB;  Service: Cardiovascular;  Laterality: N/A;  . TUBAL LIGATION     ectopic preganacy     Current Outpatient Prescriptions  Medication Sig Dispense Refill  . chlorpheniramine-HYDROcodone (TUSSIONEX PENNKINETIC ER) 10-8 MG/5ML SUER Take 5 mLs by mouth every 12 (twelve) hours as needed. 140 mL 0  . ELIQUIS 5 MG TABS tablet take 1 tablet by mouth twice a day 60 tablet 6  . glipiZIDE (GLUCOTROL XL) 5 MG 24 hr tablet take 1 tablet by mouth every morning before BREAKFAST 90 tablet 3  . losartan (COZAAR) 100 MG tablet take 1 tablet by mouth once daily 90 tablet 3  . metoprolol (LOPRESSOR) 100 MG tablet take 1 tablet by mouth twice a day 60 tablet 5  . mometasone (NASONEX) 50 MCG/ACT nasal spray Place 2 sprays into the nose daily. 17 g  1  . montelukast (SINGULAIR) 10 MG tablet Take 1 tablet (10 mg total) by mouth at bedtime. 30 tablet 2  . pantoprazole (PROTONIX) 40 MG tablet Take 40 mg by mouth daily as needed (for acid reflux).    . simvastatin (ZOCOR) 80 MG tablet take 1 tablet by mouth once daily 30 tablet 3   No current facility-administered medications for this visit.     Allergies  Allergen Reactions  . Amiodarone Itching  . Crestor [Rosuvastatin]     myalgia  . Fish Allergy Swelling    Shell fish  . Iodine Itching    itching  . Lipitor [Atorvastatin]     myalgias  . Penicillins Hives and Itching  . Ace Inhibitors Cough  . Lidocaine  Hcl Palpitations    ALL CAINES given during dental procedures    Review of Systems negative except from HPI and PMH  Physical Exam  BP (!) 170/80 (BP Location: Left Arm, Patient Position: Sitting, Cuff Size: Normal)   Pulse 78   Ht 5\' 2"  (1.575 m)   Wt 207 lb 8 oz (94.1 kg)   BMI 37.95 kg/m  Well developed and well nourished in no acute distress HENT normal E scleral and icterus clear Neck Supple JVP flat; carotids brisk and full Clear to ausculation Device pocket well healed; There is no tethering   Regular rate and rhythm, no murmurs gallops or rub Soft with active bowel sounds No clubbing cyanosis  Edema Alert and oriented, grossly normal motor and sensory function Skin Warm and Dry  ECG demonstrates atrial pacing at 77 Intervals 22/08/41 Otherwise normal  Assessment and  Plan  Atrial fibrillation-paroxysmal status post PVI [facility] 4/15  Hypertension  Diabetes  Hyperlipidemia  HFpEF  Pacemaker Medtronic The patient's device was interrogated.  The information was reviewed. No changes were made in the programming.     Encouraged exercise with increasing HgBA1c  Blood pressure is pretty close to target given her diabetes and the [employer] Trial  Euvolemic continue current meds  On Anticoagulation;  No bleeding issues   Infrequent At fib   LDL much improved continue simva

## 2016-05-09 NOTE — Patient Instructions (Signed)
Medication Instructions: - Your physician recommends that you continue on your current medications as directed. Please refer to the Current Medication list given to you today.  Labwork: - none ordered  Procedures/Testing: - none ordered  Follow-Up: - Remote monitoring is used to monitor your Pacemaker of ICD from home. This monitoring reduces the number of office visits required to check your device to one time per year. It allows [name] to keep an eye on the functioning of your device to ensure it is working properly. You are scheduled for a device check from home on 08/08/16. You may send your transmission at any time that day. If you have a wireless device, the transmission will be sent automatically. After your physician reviews your transmission, you will receive a postcard with your next transmission date.  - Your physician wants you to follow-up in: 1 year with Dr. Caryl Comes. You will receive a reminder letter in the mail two months in advance. If you don't receive a letter, please call our office to schedule the follow-up appointment.   Any Additional Special Instructions Will Be Listed Below (If Applicable).     If you need a refill on your cardiac medications before your next appointment, please call your pharmacy.

## 2016-05-10 LAB — CUP PACEART INCLINIC DEVICE CHECK
Battery Remaining Longevity: 109 mo
Brady Statistic AP VP Percent: 1.6 %
Brady Statistic AP VS Percent: 68.34 %
Brady Statistic AS VP Percent: 0.39 %
Brady Statistic AS VS Percent: 29.67 %
Brady Statistic RV Percent Paced: 2.14 %
Implantable Lead Implant Date: 20141208
Implantable Lead Location: 753860
Implantable Lead Model: 5076
Implantable Lead Model: 5076
Lead Channel Impedance Value: 399 Ohm
Lead Channel Impedance Value: 437 Ohm
Lead Channel Pacing Threshold Amplitude: 0.625 V
Lead Channel Pacing Threshold Pulse Width: 0.4 ms
Lead Channel Sensing Intrinsic Amplitude: 13.875 mV
Lead Channel Setting Pacing Amplitude: 1.75 V
Lead Channel Setting Pacing Pulse Width: 0.4 ms
Lead Channel Setting Sensing Sensitivity: 1.2 mV
MDC IDC LEAD IMPLANT DT: 20141208
MDC IDC LEAD LOCATION: 753859
MDC IDC MSMT BATTERY VOLTAGE: 3.02 V
MDC IDC MSMT LEADCHNL RA IMPEDANCE VALUE: 361 Ohm
MDC IDC MSMT LEADCHNL RA IMPEDANCE VALUE: 494 Ohm
MDC IDC MSMT LEADCHNL RA PACING THRESHOLD AMPLITUDE: 0.875 V
MDC IDC MSMT LEADCHNL RA SENSING INTR AMPL: 3.75 mV
MDC IDC MSMT LEADCHNL RV PACING THRESHOLD PULSEWIDTH: 0.4 ms
MDC IDC PG IMPLANT DT: 20141208
MDC IDC SESS DTM: 20180417135753
MDC IDC SET LEADCHNL RV PACING AMPLITUDE: 2 V
MDC IDC STAT BRADY RA PERCENT PACED: 69.53 %

## 2016-07-02 ENCOUNTER — Other Ambulatory Visit: Payer: Self-pay | Admitting: Internal Medicine

## 2016-08-06 ENCOUNTER — Other Ambulatory Visit: Payer: Self-pay | Admitting: Internal Medicine

## 2016-08-08 ENCOUNTER — Telehealth: Payer: Self-pay | Admitting: Cardiology

## 2016-08-08 ENCOUNTER — Ambulatory Visit (INDEPENDENT_AMBULATORY_CARE_PROVIDER_SITE_OTHER): Payer: Medicare Other | Admitting: *Deleted

## 2016-08-08 DIAGNOSIS — I495 Sick sinus syndrome: Secondary | ICD-10-CM

## 2016-08-08 NOTE — Telephone Encounter (Signed)
Spoke with pt and reminded pt of remote transmission that is due today. Pt verbalized understanding.   

## 2016-08-08 NOTE — Progress Notes (Signed)
Remote pacemaker transmission.   

## 2016-08-09 ENCOUNTER — Encounter: Payer: Self-pay | Admitting: Cardiology

## 2016-08-10 LAB — CUP PACEART REMOTE DEVICE CHECK
Brady Statistic AP VS Percent: 18.97 %
Brady Statistic AS VP Percent: 0.03 %
Brady Statistic AS VS Percent: 80.92 %
Brady Statistic RA Percent Paced: 18.94 %
Implantable Lead Location: 753860
Implantable Lead Model: 5076
Lead Channel Impedance Value: 361 Ohm
Lead Channel Pacing Threshold Pulse Width: 0.4 ms
Lead Channel Pacing Threshold Pulse Width: 0.4 ms
Lead Channel Sensing Intrinsic Amplitude: 2 mV
Lead Channel Sensing Intrinsic Amplitude: 7 mV
Lead Channel Sensing Intrinsic Amplitude: 7 mV
Lead Channel Setting Pacing Pulse Width: 0.4 ms
MDC IDC LEAD IMPLANT DT: 20141208
MDC IDC LEAD IMPLANT DT: 20141208
MDC IDC LEAD LOCATION: 753859
MDC IDC MSMT BATTERY REMAINING LONGEVITY: 86 mo
MDC IDC MSMT BATTERY VOLTAGE: 3.01 V
MDC IDC MSMT LEADCHNL RA IMPEDANCE VALUE: 399 Ohm
MDC IDC MSMT LEADCHNL RA PACING THRESHOLD AMPLITUDE: 0.5 V
MDC IDC MSMT LEADCHNL RA SENSING INTR AMPL: 2 mV
MDC IDC MSMT LEADCHNL RV IMPEDANCE VALUE: 399 Ohm
MDC IDC MSMT LEADCHNL RV IMPEDANCE VALUE: 437 Ohm
MDC IDC MSMT LEADCHNL RV PACING THRESHOLD AMPLITUDE: 0.5 V
MDC IDC PG IMPLANT DT: 20141208
MDC IDC SESS DTM: 20180717163956
MDC IDC SET LEADCHNL RA PACING AMPLITUDE: 2 V
MDC IDC SET LEADCHNL RV PACING AMPLITUDE: 2.5 V
MDC IDC SET LEADCHNL RV SENSING SENSITIVITY: 2 mV
MDC IDC STAT BRADY AP VP PERCENT: 0.08 %
MDC IDC STAT BRADY RV PERCENT PACED: 0.11 %

## 2016-08-23 ENCOUNTER — Encounter: Payer: Self-pay | Admitting: Cardiology

## 2016-09-11 ENCOUNTER — Encounter: Payer: Self-pay | Admitting: Family Medicine

## 2016-09-11 ENCOUNTER — Ambulatory Visit (INDEPENDENT_AMBULATORY_CARE_PROVIDER_SITE_OTHER): Payer: Medicare Other | Admitting: Family Medicine

## 2016-09-11 VITALS — BP 164/90 | HR 87 | Temp 98.2°F | Ht 62.0 in | Wt 209.0 lb

## 2016-09-11 DIAGNOSIS — I4891 Unspecified atrial fibrillation: Secondary | ICD-10-CM

## 2016-09-11 DIAGNOSIS — E1159 Type 2 diabetes mellitus with other circulatory complications: Secondary | ICD-10-CM | POA: Diagnosis not present

## 2016-09-11 DIAGNOSIS — Z79899 Other long term (current) drug therapy: Secondary | ICD-10-CM | POA: Diagnosis not present

## 2016-09-11 DIAGNOSIS — I10 Essential (primary) hypertension: Secondary | ICD-10-CM

## 2016-09-11 DIAGNOSIS — I63549 Cerebral infarction due to unspecified occlusion or stenosis of unspecified cerebellar artery: Secondary | ICD-10-CM

## 2016-09-11 DIAGNOSIS — E785 Hyperlipidemia, unspecified: Secondary | ICD-10-CM | POA: Diagnosis not present

## 2016-09-11 LAB — CBC WITH DIFFERENTIAL/PLATELET
BASOS PCT: 1 % (ref 0.0–3.0)
Basophils Absolute: 0.1 10*3/uL (ref 0.0–0.1)
Eosinophils Absolute: 0.1 10*3/uL (ref 0.0–0.7)
Eosinophils Relative: 1.9 % (ref 0.0–5.0)
HEMATOCRIT: 44.7 % (ref 36.0–46.0)
Hemoglobin: 14.9 g/dL (ref 12.0–15.0)
LYMPHS PCT: 26.4 % (ref 12.0–46.0)
Lymphs Abs: 1.6 10*3/uL (ref 0.7–4.0)
MCHC: 33.4 g/dL (ref 30.0–36.0)
MCV: 92.3 fl (ref 78.0–100.0)
MONOS PCT: 7.6 % (ref 3.0–12.0)
Monocytes Absolute: 0.5 10*3/uL (ref 0.1–1.0)
NEUTROS ABS: 3.8 10*3/uL (ref 1.4–7.7)
Neutrophils Relative %: 63.1 % (ref 43.0–77.0)
Platelets: 220 10*3/uL (ref 150.0–400.0)
RBC: 4.84 Mil/uL (ref 3.87–5.11)
RDW: 13.1 % (ref 11.5–15.5)
WBC: 6.1 10*3/uL (ref 4.0–10.5)

## 2016-09-11 LAB — BASIC METABOLIC PANEL
BUN: 16 mg/dL (ref 6–23)
CO2: 25 mEq/L (ref 19–32)
CREATININE: 0.93 mg/dL (ref 0.40–1.20)
Calcium: 9.8 mg/dL (ref 8.4–10.5)
Chloride: 102 mEq/L (ref 96–112)
GFR: 63.14 mL/min (ref >60.00)
Glucose, Bld: 238 mg/dL — ABNORMAL HIGH (ref 70–99)
Potassium: 4.2 mEq/L (ref 3.5–5.1)
Sodium: 136 mEq/L (ref 135–145)

## 2016-09-11 LAB — HEPATIC FUNCTION PANEL
ALT: 33 U/L (ref 0–35)
AST: 20 U/L (ref 0–37)
Albumin: 4 g/dL (ref 3.5–5.2)
Alkaline Phosphatase: 81 U/L (ref 39–117)
Bilirubin, Direct: 0.2 mg/dL (ref 0.0–0.3)
Total Bilirubin: 0.7 mg/dL (ref 0.2–1.2)
Total Protein: 7.1 g/dL (ref 6.0–8.3)

## 2016-09-11 LAB — HEMOGLOBIN A1C: HEMOGLOBIN A1C: 8.4 % — AB (ref 4.6–6.5)

## 2016-09-11 NOTE — Patient Instructions (Signed)
Metoprolol - increase to 1 full tablet twice a day.

## 2016-09-11 NOTE — Progress Notes (Signed)
Dr. Frederico Hamman T. Bryann Mcnealy, MD, [facility] [place], [address] Phone: [phone] Fax: [phone]  09/11/2016  Patient: Nina Carpenter, MRN: [id number], DOB: October 22, 1945, 71 y.o.  Primary Physician:  [name], MD   Chief Complaint  Patient presents with  . Follow-up    6 month   Subjective:   CHRISOULA ZEGARRA is a 71 y.o. very pleasant female patient who presents with the following:  Diabetes Mellitus: Tolerating Medications: yes Compliance with diet: fair Exercise: minimal / intermittent Avg blood sugars at home: not checking Foot problems: none Hypoglycemia: none No nausea, vomitting, blurred vision, polyuria.  Lab Results  Component Value Date   HGBA1C 7.7 (H) 03/13/2016   HGBA1C 7.1 (H) 09/20/2015   HGBA1C 6.9 (H) 03/10/2015   Lab Results  Component Value Date   MICROALBUR 1.8 03/13/2016   LDLCALC 90 03/13/2016   CREATININE 0.85 03/13/2016    Wt Readings from Last 3 Encounters:  09/11/16 209 lb (94.8 kg)  05/09/16 207 lb 8 oz (94.1 kg)  04/04/16 208 lb (94.3 kg)    Body mass index is 38.23 kg/m.   HTN: Tolerating all medications without side effects Stable and at goal No CP, no sob. No HA.  BP Readings from Last 3 Encounters:  09/11/16 (!) 164/90  05/09/16 (!) 170/80  04/04/16 295/18    Basic Metabolic Panel:    Component Value Date/Time   NA 139 03/13/2016 1025   NA 139 05/09/2013 2023   K 4.2 03/13/2016 1025   K 3.9 05/09/2013 2023   CL 105 03/13/2016 1025   CL 103 05/09/2013 2023   CO2 26 03/13/2016 1025   CO2 29 05/09/2013 2023   BUN 13 03/13/2016 1025   BUN 14 05/09/2013 2023   CREATININE 0.85 03/13/2016 1025   CREATININE 0.97 05/09/2013 2023   GLUCOSE 166 (H) 03/13/2016 1025   GLUCOSE 141 (H) 05/09/2013 2023   CALCIUM 9.4 03/13/2016 1025   CALCIUM 9.5 05/09/2013 2023    Lipids: Doing well, stable. Tolerating meds fine with no SE. Panel reviewed with  patient.  Lipids:    Component Value Date/Time   CHOL 168 03/13/2016 1025   TRIG 196.0 (H) 03/13/2016 1025   HDL 39.00 (L) 03/13/2016 1025   LDLDIRECT 178.0 11/18/2012 1049   VLDL 39.2 03/13/2016 1025   CHOLHDL 4 03/13/2016 1025    Lab Results  Component Value Date   ALT 25 03/13/2016   AST 17 03/13/2016   ALKPHOS 82 03/13/2016   BILITOT 0.8 03/13/2016       Past Medical History, Surgical History, Social History, Family History, Problem List, Medications, and Allergies have been reviewed and updated if relevant.  Patient Active Problem List   Diagnosis Date Noted  . CVA (cerebral vascular accident) ([place]) 01/06/2013    Priority: High  . Atrial fibrillation ([place]) 12/29/2012    Priority: High  . Type 2 diabetes mellitus with vascular disease ([place]) 05/04/2008    Priority: High  . Cardiac pacemaker in situ 01/06/2013    Priority: Medium  . History of noncompliance with medical treatment 01/06/2013    Priority: Medium  . Tachy-brady syndrome ([place]) 12/29/2012    Priority: Medium  . Hx of rheumatic fever 08/07/2011    Priority: Medium  . NEURALGIA, TRIGEMINAL 01/26/2010    Priority: Medium  . Hyperlipidemia LDL goal <70 05/04/2008    Priority: Medium  . Essential hypertension 05/04/2008    Priority: Medium  .  ASTHMA 05/04/2008    Priority: Medium  . OSA (obstructive sleep apnea) 05/25/2014  . Chronic anticoagulation- Eliquis 01/27/2013  . ALLERGIC RHINITIS 05/04/2008  . GERD 05/04/2008  . POSTMENOPAUSAL STATUS 05/04/2008    Past Medical History:  Diagnosis Date  . Acid reflux disease   . Allergy   . Asthma    a. Remotely.  . Chronic combined systolic and diastolic CHF (congestive heart failure) ([place])    a. Felt related to AF. EF 45-50% by echo 12/2012.  Marland Kitchen CVA (cerebral vascular accident) ([place])    a. Thromboemoblic L MCA CVA 73/4193 with no residual deficit except mild occasional word finding. b. 7-90% RICA/LICA by dopplers 24/0/97.  Marland Kitchen GERD (gastroesophageal  reflux disease)   . H/O medication noncompliance    a. Per PCP note 01/06/13: Previously refused anything greater than aspirin, refused treatment for DM, and often stopped hypertensive meds and lipid meds.  Marland Kitchen Hx of rheumatic fever    a. As a child.  . Hyperlipidemia   . Hypertension   . Kidney stones    a. Remotely.  . Obesity   . PAF (paroxysmal atrial fibrillation) ([place])    a. Dx 09/2012, chronicity unclear at that time. Initially refused Xarelto due to risk of bleeding. b. Sustained CVA 12/2012 - agreeable to taking Eliquis. Tachybrady syndrome during that admission s/p Medtronic PPM implantation;  c. Failed flecainide, sotalol, amiodarone (itching), and propafenone.  . Tachy-brady syndrome ([place])    a. AF RVR with pauses >5sec requiring Medtronic PPM 12/2012.  . Tubular adenoma of colon 05/2008  . Type 2 diabetes mellitus ([place])     Past Surgical History:  Procedure Laterality Date  . CHOLECYSTECTOMY    . DESTRUCTION TRIGEMINAL NERVE VIA NEUROLYTIC AGENT  NOv @013    Gamma Knife   . INSERT / REPLACE / REMOVE PACEMAKER    . PACEMAKER INSERTION  12-30-12   MDT dual chamber pacemaker implanted by Dr Caryl Comes for tachy-brady syndrome  . PERMANENT PACEMAKER INSERTION N/A 12/30/2012   Procedure: PERMANENT PACEMAKER INSERTION;  Surgeon: Deboraha Sprang, MD;  Location: [facility] CATH LAB;  Service: Cardiovascular;  Laterality: N/A;  . TUBAL LIGATION     ectopic preganacy     Social History   Social History  . Marital status: Married    Spouse name: N/A  . Number of children: 2  . Years of education: N/A   Occupational History  . cosmetologist Self Employed   Social History Main Topics  . Smoking status: Former Smoker    Packs/day: 2.00    Types: Cigarettes    Quit date: 01/24/1968  . Smokeless tobacco: Never Used  . Alcohol use 0.6 oz/week    1 Glasses of wine per week     Comment: rare use  . Drug use: No  . Sexual activity: Yes   Other Topics Concern  . Not on file   Social  History Narrative   Lives in [place] with her husband.  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    Family History  Problem Relation Age of Onset  . Stroke Mother   . Diabetes Mother     Allergies  Allergen Reactions  . Bee Venom Anaphylaxis  . Amiodarone Itching  . Crestor [Rosuvastatin]     myalgia  . Fish Allergy Swelling    Shell fish  . Iodine Itching    itching  . Lipitor [Atorvastatin]     myalgias  . Penicillins Hives and Itching  . Ace Inhibitors  Cough  . Lidocaine Hcl Palpitations    ALL CAINES given during dental procedures    Medication list reviewed and updated in full in [place].   GEN: No acute illnesses, no fevers, chills. GI: No n/v/d, eating normally Pulm: No SOB Interactive and getting along well at home.  Otherwise, ROS is as per the HPI.  Objective:   BP (!) 164/90   Pulse 87   Temp 98.2 F (36.8 C) (Oral)   Ht 5\' 2"  (1.575 m)   Wt 209 lb (94.8 kg)   BMI 38.23 kg/m   GEN: WDWN, NAD, Non-toxic, A & O x 3 HEENT: Atraumatic, Normocephalic. Neck supple. No masses, No LAD. Ears and Nose: No external deformity. CV: RRR, No M/G/R. No JVD. No thrill. No extra heart sounds. PULM: CTA B, no wheezes, crackles, rhonchi. No retractions. No resp. distress. No accessory muscle use. EXTR: No c/c/e NEURO Normal gait.  PSYCH: Normally interactive. Conversant. Not depressed or anxious appearing.  Calm demeanor.   Laboratory and Imaging Data:  Assessment and Plan:   Type 2 diabetes mellitus with vascular disease ([place]) - Plan: Hemoglobin A1c  Atrial fibrillation, unspecified type ([place])  Essential hypertension - Plan: Basic metabolic panel  Hyperlipidemia LDL goal <70  Encounter for long-term (current) use of medications - Plan: CBC with Differential/Platelet, Hepatic function panel  Cerebrovascular accident (CVA) due to occlusion of cerebellar artery, unspecified blood vessel laterality (HCC)  Multiple issues, history of  CVA.  Blood pressure is not controlled. Increase metoprolol dosing to 100 milligrams by mouth twice a day with close follow-up. I asked her to check her blood pressure daily and bring this into her next appointment.  Follow-up: Return in about 1 month (around 10/12/2016).  Future Appointments Date Time Provider [place]  10/16/2016 9:45 AM [name], MD [facility]  11/07/2016 10:45 AM [facility] DEVICE REMOTES [facility]    No orders of the defined types were placed in this encounter.  Medications Discontinued During This Encounter  Medication Reason  . chlorpheniramine-HYDROcodone (TUSSIONEX PENNKINETIC ER) 10-8 MG/5ML SUER Completed Course   Orders Placed This Encounter  Procedures  . CBC with Differential/Platelet  . Basic metabolic panel  . Hepatic function panel  . Hemoglobin A1c    Signed,  Yelena Metzer T. Joffrey Kerce, MD   Allergies as of 09/11/2016      Reactions   Bee Venom Anaphylaxis   Amiodarone Itching   Crestor [rosuvastatin]    myalgia   Fish Allergy Swelling   Shell fish   Iodine Itching   itching   Lipitor [atorvastatin]    myalgias   Penicillins Hives, Itching   Ace Inhibitors Cough   Lidocaine Hcl Palpitations   ALL CAINES given during dental procedures      Medication List       Accurate as of 09/11/16  2:44 PM. Always use your most recent med list.          ELIQUIS 5 MG Tabs tablet Generic drug:  apixaban take 1 tablet by mouth twice a day   glipiZIDE 5 MG 24 hr tablet Commonly known as:  GLUCOTROL XL take 1 tablet by mouth every morning before BREAKFAST   losartan 100 MG tablet Commonly known as:  COZAAR take 1 tablet by mouth once daily   metoprolol tartrate 100 MG tablet Commonly known as:  LOPRESSOR take 1 tablet by mouth twice a day   mometasone 50 MCG/ACT nasal spray Commonly known as:  NASONEX Place 2 sprays into  the nose daily.   montelukast 10 MG tablet Commonly known as:   SINGULAIR Take 1 tablet (10 mg total) by mouth at bedtime.   pantoprazole 40 MG tablet Commonly known as:  PROTONIX Take 40 mg by mouth daily as needed (for acid reflux).   simvastatin 80 MG tablet Commonly known as:  ZOCOR take 1 tablet by mouth once daily

## 2016-09-13 ENCOUNTER — Telehealth: Payer: Self-pay | Admitting: *Deleted

## 2016-09-13 MED ORDER — GLIPIZIDE ER 10 MG PO TB24: 10.0000 mg | ORAL_TABLET | Freq: Every day | ORAL | 1 refills | Status: DC

## 2016-09-13 NOTE — Telephone Encounter (Signed)
Ms. Tall notified as instructed by telephone.  Glipizide XR 10 mg prescription sent into [facility] on [address] as instructed by Dr. Lorelei Pont.

## 2016-09-13 NOTE — Telephone Encounter (Signed)
-----   Message from [name], MD sent at 09/13/2016 11:24 AM EDT ----- Call  a1c is up to 8.4  Increase glipizide and call in new Glipizide XR 10 mg, 1 po daily, 90, 1 ref  All others are fine

## 2016-10-16 ENCOUNTER — Encounter: Payer: Self-pay | Admitting: Family Medicine

## 2016-10-16 ENCOUNTER — Ambulatory Visit (INDEPENDENT_AMBULATORY_CARE_PROVIDER_SITE_OTHER): Payer: Medicare Other | Admitting: Family Medicine

## 2016-10-16 VITALS — BP 142/80 | HR 64 | Temp 98.3°F | Ht 62.0 in | Wt 207.5 lb

## 2016-10-16 DIAGNOSIS — I10 Essential (primary) hypertension: Secondary | ICD-10-CM

## 2016-10-16 DIAGNOSIS — Z8673 Personal history of transient ischemic attack (TIA), and cerebral infarction without residual deficits: Secondary | ICD-10-CM

## 2016-10-16 DIAGNOSIS — I63549 Cerebral infarction due to unspecified occlusion or stenosis of unspecified cerebellar artery: Secondary | ICD-10-CM

## 2016-10-16 NOTE — Progress Notes (Signed)
Dr. Frederico Hamman T. Anthonella Klausner, MD, [facility] [place], [address] Phone: [phone] Fax: [phone]  10/16/2016  Patient: Nina Carpenter, MRN: [id number], DOB: 09-17-1945, 71 y.o.  Primary Physician:  [name], MD   No chief complaint on file.  Subjective:   Nina Carpenter is a 71 y.o. very pleasant female patient who presents with the following:  She is basically here in follow-up about her high blood pressure, which had been trending higher again.  She does have a history of a stroke, and she is feeling much better now is asymptomatic.  HTN: Tolerating all medications without side effects Stable and at goal No CP, no sob. No HA.  BP Readings from Last 3 Encounters:  10/16/16 (!) 142/80  09/11/16 (!) 164/90  05/09/16 (!) 371/06    Basic Metabolic Panel:    Component Value Date/Time   NA 136 09/11/2016 1013   NA 139 05/09/2013 2023   K 4.2 09/11/2016 1013   K 3.9 05/09/2013 2023   CL 102 09/11/2016 1013   CL 103 05/09/2013 2023   CO2 25 09/11/2016 1013   CO2 29 05/09/2013 2023   BUN 16 09/11/2016 1013   BUN 14 05/09/2013 2023   CREATININE 0.93 09/11/2016 1013   CREATININE 0.97 05/09/2013 2023   GLUCOSE 238 (H) 09/11/2016 1013   GLUCOSE 141 (H) 05/09/2013 2023   CALCIUM 9.8 09/11/2016 1013   CALCIUM 9.5 05/09/2013 2023     Past Medical History, Surgical History, Social History, Family History, Problem List, Medications, and Allergies have been reviewed and updated if relevant.  Patient Active Problem List   Diagnosis Date Noted  . CVA (cerebral vascular accident) ([place]) 01/06/2013    Priority: High  . Atrial fibrillation ([place]) 12/29/2012    Priority: High  . Type 2 diabetes mellitus with vascular disease ([place]) 05/04/2008    Priority: High  . Cardiac pacemaker in situ 01/06/2013    Priority: Medium  . History of noncompliance with medical treatment 01/06/2013    Priority: Medium  .  Tachy-brady syndrome ([place]) 12/29/2012    Priority: Medium  . Hx of rheumatic fever 08/07/2011    Priority: Medium  . NEURALGIA, TRIGEMINAL 01/26/2010    Priority: Medium  . Hyperlipidemia LDL goal <70 05/04/2008    Priority: Medium  . Essential hypertension 05/04/2008    Priority: Medium  . ASTHMA 05/04/2008    Priority: Medium  . OSA (obstructive sleep apnea) 05/25/2014  . Chronic anticoagulation- Eliquis 01/27/2013  . ALLERGIC RHINITIS 05/04/2008  . GERD 05/04/2008  . POSTMENOPAUSAL STATUS 05/04/2008    Past Medical History:  Diagnosis Date  . Acid reflux disease   . Allergy   . Asthma    a. Remotely.  . Chronic combined systolic and diastolic CHF (congestive heart failure) ([place])    a. Felt related to AF. EF 45-50% by echo 12/2012.  Marland Kitchen CVA (cerebral vascular accident) ([place])    a. Thromboemoblic L MCA CVA 26/9485 with no residual deficit except mild occasional word finding. b. 4-62% RICA/LICA by dopplers 70/3/50.  Marland Kitchen GERD (gastroesophageal reflux disease)   . H/O medication noncompliance    a. Per PCP note 01/06/13: Previously refused anything greater than aspirin, refused treatment for DM, and often stopped hypertensive meds and lipid meds.  Marland Kitchen Hx of rheumatic fever    a. As a child.  . Hyperlipidemia   . Hypertension   . Kidney stones    a. Remotely.  Marland Kitchen  Obesity   . PAF (paroxysmal atrial fibrillation) ([place])    a. Dx 09/2012, chronicity unclear at that time. Initially refused Xarelto due to risk of bleeding. b. Sustained CVA 12/2012 - agreeable to taking Eliquis. Tachybrady syndrome during that admission s/p Medtronic PPM implantation;  c. Failed flecainide, sotalol, amiodarone (itching), and propafenone.  . Tachy-brady syndrome ([place])    a. AF RVR with pauses >5sec requiring Medtronic PPM 12/2012.  . Tubular adenoma of colon 05/2008  . Type 2 diabetes mellitus ([place])     Past Surgical History:  Procedure Laterality Date  . CHOLECYSTECTOMY    . DESTRUCTION TRIGEMINAL  NERVE VIA NEUROLYTIC AGENT  NOv @013    Gamma Knife   . INSERT / REPLACE / REMOVE PACEMAKER    . PACEMAKER INSERTION  12-30-12   MDT dual chamber pacemaker implanted by Dr Caryl Comes for tachy-brady syndrome  . PERMANENT PACEMAKER INSERTION N/A 12/30/2012   Procedure: PERMANENT PACEMAKER INSERTION;  Surgeon: Deboraha Sprang, MD;  Location: [facility] CATH LAB;  Service: Cardiovascular;  Laterality: N/A;  . TUBAL LIGATION     ectopic preganacy     Social History   Social History  . Marital status: Married    Spouse name: N/A  . Number of children: 2  . Years of education: N/A   Occupational History  . cosmetologist Self Employed   Social History Main Topics  . Smoking status: Former Smoker    Packs/day: 2.00    Types: Cigarettes    Quit date: 01/24/1968  . Smokeless tobacco: Never Used  . Alcohol use 0.6 oz/week    1 Glasses of wine per week     Comment: rare use  . Drug use: No  . Sexual activity: Yes   Other Topics Concern  . Not on file   Social History Narrative   Lives in [place] with her husband.  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    Family History  Problem Relation Age of Onset  . Stroke Mother   . Diabetes Mother     Allergies  Allergen Reactions  . Bee Venom Anaphylaxis  . Amiodarone Itching  . Crestor [Rosuvastatin]     myalgia  . Fish Allergy Swelling    Shell fish  . Iodine Itching    itching  . Lipitor [Atorvastatin]     myalgias  . Penicillins Hives and Itching  . Ace Inhibitors Cough  . Lidocaine Hcl Palpitations    ALL CAINES given during dental procedures    Medication list reviewed and updated in full in [place].   GEN: No acute illnesses, no fevers, chills. GI: No n/v/d, eating normally Pulm: No SOB Interactive and getting along well at home.  Otherwise, ROS is as per the HPI.  Objective:   BP (!) 142/80   Pulse 64   Temp 98.3 F (36.8 C) (Oral)   Ht 5\' 2"  (1.575 m)   Wt 207 lb 8 oz (94.1 kg)   BMI 37.95 kg/m    GEN: WDWN, NAD, Non-toxic, A & O x 3 HEENT: Atraumatic, Normocephalic. Neck supple. No masses, No LAD. Ears and Nose: No external deformity. CV: RRR, No M/G/R. No JVD. No thrill. No extra heart sounds. PULM: CTA B, no wheezes, crackles, rhonchi. No retractions. No resp. distress. No accessory muscle use. EXTR: No c/c/e NEURO Normal gait.  PSYCH: Normally interactive. Conversant. Not depressed or anxious appearing.  Calm demeanor.   Laboratory and Imaging Data:  Assessment and Plan:   Essential  hypertension  Cerebrovascular accident (CVA) due to occlusion of cerebellar artery, unspecified blood vessel laterality ([place])  Doing well and stable on increased BP meds.  Follow-up: Return in about 6 months (around 04/15/2017) for Medicare Wellness exam.  Future Appointments Date Time Provider [place]  11/07/2016 10:45 AM [facility] DEVICE REMOTES [facility]  04/16/2017 10:30 AM Eustace Pen, LPN [facility]  04/23/2017 10:30 AM Onesty Clair, Frederico Hamman, MD [facility]   Signed,  Maud Deed. Letetia Romanello, MD   Patient's Medications  New Prescriptions   No medications on file  Previous Medications   ELIQUIS 5 MG TABS TABLET    take 1 tablet by mouth twice a day   GLIPIZIDE (GLIPIZIDE XL) 10 MG 24 HR TABLET    Take 1 tablet (10 mg total) by mouth daily with breakfast.   LOSARTAN (COZAAR) 100 MG TABLET    take 1 tablet by mouth once daily   METOPROLOL (LOPRESSOR) 100 MG TABLET    take 1 tablet by mouth twice a day   MOMETASONE (NASONEX) 50 MCG/ACT NASAL SPRAY    Place 2 sprays into the nose daily.   MONTELUKAST (SINGULAIR) 10 MG TABLET    Take 1 tablet (10 mg total) by mouth at bedtime.   PANTOPRAZOLE (PROTONIX) 40 MG TABLET    Take 40 mg by mouth daily as needed (for acid reflux).   SIMVASTATIN (ZOCOR) 80 MG TABLET    take 1 tablet by mouth once daily  Modified Medications   No medications on file  Discontinued Medications   No medications on  file

## 2016-11-07 ENCOUNTER — Ambulatory Visit (INDEPENDENT_AMBULATORY_CARE_PROVIDER_SITE_OTHER): Payer: Medicare Other | Admitting: *Deleted

## 2016-11-07 ENCOUNTER — Telehealth: Payer: Self-pay | Admitting: Cardiology

## 2016-11-07 DIAGNOSIS — I495 Sick sinus syndrome: Secondary | ICD-10-CM | POA: Diagnosis not present

## 2016-11-07 NOTE — Telephone Encounter (Signed)
Spoke with pt and reminded pt of remote transmission that is due today. Pt verbalized understanding.   

## 2016-11-08 NOTE — Progress Notes (Signed)
Remote pacemaker transmission.   

## 2016-11-09 LAB — CUP PACEART REMOTE DEVICE CHECK
Battery Remaining Longevity: 76 mo
Battery Voltage: 3.01 V
Brady Statistic AS VS Percent: 73.53 %
Brady Statistic RV Percent Paced: 0.17 %
Date Time Interrogation Session: 20181017010947
Implantable Lead Implant Date: 20141208
Implantable Lead Implant Date: 20141208
Implantable Lead Location: 753859
Implantable Lead Location: 753860
Implantable Pulse Generator Implant Date: 20141208
Lead Channel Impedance Value: 437 Ohm
Lead Channel Pacing Threshold Amplitude: 0.5 V
Lead Channel Pacing Threshold Amplitude: 0.5 V
Lead Channel Sensing Intrinsic Amplitude: 2.5 mV
Lead Channel Sensing Intrinsic Amplitude: 4.875 mV
Lead Channel Setting Pacing Amplitude: 2 V
Lead Channel Setting Pacing Amplitude: 2.5 V
Lead Channel Setting Pacing Pulse Width: 0.4 ms
Lead Channel Setting Sensing Sensitivity: 2 mV
MDC IDC MSMT LEADCHNL RA IMPEDANCE VALUE: 361 Ohm
MDC IDC MSMT LEADCHNL RA IMPEDANCE VALUE: 399 Ohm
MDC IDC MSMT LEADCHNL RA PACING THRESHOLD PULSEWIDTH: 0.4 ms
MDC IDC MSMT LEADCHNL RA SENSING INTR AMPL: 2.5 mV
MDC IDC MSMT LEADCHNL RV IMPEDANCE VALUE: 380 Ohm
MDC IDC MSMT LEADCHNL RV PACING THRESHOLD PULSEWIDTH: 0.4 ms
MDC IDC MSMT LEADCHNL RV SENSING INTR AMPL: 4.875 mV
MDC IDC STAT BRADY AP VP PERCENT: 0.14 %
MDC IDC STAT BRADY AP VS PERCENT: 26.3 %
MDC IDC STAT BRADY AS VP PERCENT: 0.03 %
MDC IDC STAT BRADY RA PERCENT PACED: 26.33 %

## 2016-11-10 ENCOUNTER — Encounter: Payer: Self-pay | Admitting: Cardiology

## 2016-11-28 DIAGNOSIS — Z23 Encounter for immunization: Secondary | ICD-10-CM | POA: Diagnosis not present

## 2016-12-23 ENCOUNTER — Other Ambulatory Visit: Payer: Self-pay | Admitting: Family Medicine

## 2016-12-28 ENCOUNTER — Other Ambulatory Visit: Payer: Self-pay

## 2016-12-28 MED ORDER — SIMVASTATIN 80 MG PO TABS: 80.0000 mg | ORAL_TABLET | Freq: Every day | ORAL | 3 refills | Status: DC

## 2016-12-28 NOTE — Telephone Encounter (Signed)
Requested Prescriptions   Signed Prescriptions Disp Refills  . simvastatin (ZOCOR) 80 MG tablet 30 tablet 3    Sig: Take 1 tablet (80 mg total) by mouth daily.    Authorizing Provider: Deboraha Sprang    Ordering User: Janan Ridge

## 2017-01-03 ENCOUNTER — Other Ambulatory Visit: Payer: Self-pay

## 2017-01-03 MED ORDER — APIXABAN 5 MG PO TABS: 5.0000 mg | ORAL_TABLET | Freq: Two times a day (BID) | ORAL | 5 refills | Status: DC

## 2017-01-03 NOTE — Telephone Encounter (Signed)
Refill sent for Eliquis 5 mg  

## 2017-02-02 ENCOUNTER — Other Ambulatory Visit: Payer: Self-pay | Admitting: Family Medicine

## 2017-02-06 ENCOUNTER — Ambulatory Visit (INDEPENDENT_AMBULATORY_CARE_PROVIDER_SITE_OTHER): Payer: Medicare Other | Admitting: *Deleted

## 2017-02-06 DIAGNOSIS — I495 Sick sinus syndrome: Secondary | ICD-10-CM

## 2017-02-08 NOTE — Progress Notes (Signed)
Remote pacemaker transmission.   

## 2017-02-09 ENCOUNTER — Encounter: Payer: Self-pay | Admitting: Cardiology

## 2017-02-09 LAB — CUP PACEART REMOTE DEVICE CHECK
Battery Voltage: 3.01 V
Brady Statistic AS VP Percent: 0.03 %
Brady Statistic AS VS Percent: 74.29 %
Brady Statistic RA Percent Paced: 25.67 %
Implantable Lead Location: 753860
Implantable Lead Model: 5076
Lead Channel Impedance Value: 361 Ohm
Lead Channel Impedance Value: 399 Ohm
Lead Channel Impedance Value: 399 Ohm
Lead Channel Pacing Threshold Amplitude: 0.5 V
Lead Channel Pacing Threshold Amplitude: 0.5 V
Lead Channel Pacing Threshold Pulse Width: 0.4 ms
Lead Channel Pacing Threshold Pulse Width: 0.4 ms
Lead Channel Sensing Intrinsic Amplitude: 3.125 mV
Lead Channel Sensing Intrinsic Amplitude: 7.875 mV
Lead Channel Setting Pacing Amplitude: 2 V
Lead Channel Setting Sensing Sensitivity: 2 mV
MDC IDC LEAD IMPLANT DT: 20141208
MDC IDC LEAD IMPLANT DT: 20141208
MDC IDC LEAD LOCATION: 753859
MDC IDC MSMT BATTERY REMAINING LONGEVITY: 77 mo
MDC IDC MSMT LEADCHNL RA SENSING INTR AMPL: 3.125 mV
MDC IDC MSMT LEADCHNL RV IMPEDANCE VALUE: 437 Ohm
MDC IDC MSMT LEADCHNL RV SENSING INTR AMPL: 7.875 mV
MDC IDC PG IMPLANT DT: 20141208
MDC IDC SESS DTM: 20190115150753
MDC IDC SET LEADCHNL RV PACING AMPLITUDE: 2.5 V
MDC IDC SET LEADCHNL RV PACING PULSEWIDTH: 0.4 ms
MDC IDC STAT BRADY AP VP PERCENT: 0.06 %
MDC IDC STAT BRADY AP VS PERCENT: 25.63 %
MDC IDC STAT BRADY RV PERCENT PACED: 0.09 %

## 2017-02-13 DIAGNOSIS — Z23 Encounter for immunization: Secondary | ICD-10-CM | POA: Diagnosis not present

## 2017-02-13 DIAGNOSIS — L570 Actinic keratosis: Secondary | ICD-10-CM | POA: Diagnosis not present

## 2017-02-13 DIAGNOSIS — L820 Inflamed seborrheic keratosis: Secondary | ICD-10-CM | POA: Diagnosis not present

## 2017-02-13 DIAGNOSIS — D485 Neoplasm of uncertain behavior of skin: Secondary | ICD-10-CM | POA: Diagnosis not present

## 2017-03-23 ENCOUNTER — Other Ambulatory Visit: Payer: Self-pay | Admitting: Family Medicine

## 2017-04-16 ENCOUNTER — Ambulatory Visit (INDEPENDENT_AMBULATORY_CARE_PROVIDER_SITE_OTHER): Payer: Medicare Other

## 2017-04-16 VITALS — BP 140/76 | HR 77 | Temp 98.0°F | Ht 61.5 in | Wt 208.2 lb

## 2017-04-16 DIAGNOSIS — I10 Essential (primary) hypertension: Secondary | ICD-10-CM | POA: Diagnosis not present

## 2017-04-16 DIAGNOSIS — E785 Hyperlipidemia, unspecified: Secondary | ICD-10-CM | POA: Diagnosis not present

## 2017-04-16 DIAGNOSIS — E1159 Type 2 diabetes mellitus with other circulatory complications: Secondary | ICD-10-CM | POA: Diagnosis not present

## 2017-04-16 DIAGNOSIS — Z0000 Encounter for general adult medical examination without abnormal findings: Secondary | ICD-10-CM

## 2017-04-16 LAB — CBC WITH DIFFERENTIAL/PLATELET
Basophils Absolute: 0 10*3/uL (ref 0.0–0.1)
Basophils Relative: 1 % (ref 0.0–3.0)
EOS ABS: 0.1 10*3/uL (ref 0.0–0.7)
EOS PCT: 2.2 % (ref 0.0–5.0)
HCT: 43.1 % (ref 36.0–46.0)
HEMOGLOBIN: 14.6 g/dL (ref 12.0–15.0)
Lymphocytes Relative: 34.9 % (ref 12.0–46.0)
Lymphs Abs: 1.7 10*3/uL (ref 0.7–4.0)
MCHC: 33.8 g/dL (ref 30.0–36.0)
MCV: 91.3 fl (ref 78.0–100.0)
MONO ABS: 0.4 10*3/uL (ref 0.1–1.0)
Monocytes Relative: 7.6 % (ref 3.0–12.0)
Neutro Abs: 2.7 10*3/uL (ref 1.4–7.7)
Neutrophils Relative %: 54.3 % (ref 43.0–77.0)
Platelets: 201 10*3/uL (ref 150.0–400.0)
RBC: 4.72 Mil/uL (ref 3.87–5.11)
RDW: 13.1 % (ref 11.5–15.5)
WBC: 5 10*3/uL (ref 4.0–10.5)

## 2017-04-16 LAB — HEPATIC FUNCTION PANEL
ALK PHOS: 84 U/L (ref 39–117)
ALT: 26 U/L (ref 0–35)
AST: 17 U/L (ref 0–37)
Albumin: 4.1 g/dL (ref 3.5–5.2)
BILIRUBIN DIRECT: 0.1 mg/dL (ref 0.0–0.3)
Total Bilirubin: 0.8 mg/dL (ref 0.2–1.2)
Total Protein: 7.1 g/dL (ref 6.0–8.3)

## 2017-04-16 LAB — LIPID PANEL
CHOLESTEROL: 161 mg/dL (ref 0–200)
HDL: 41.6 mg/dL (ref >39.00)
LDL Cholesterol: 89 mg/dL (ref 0–99)
NonHDL: 119.37
Total CHOL/HDL Ratio: 4
Triglycerides: 150 mg/dL — ABNORMAL HIGH (ref 0.0–149.0)
VLDL: 30 mg/dL (ref 0.0–40.0)

## 2017-04-16 LAB — BASIC METABOLIC PANEL
BUN: 17 mg/dL (ref 6–23)
CHLORIDE: 103 meq/L (ref 96–112)
CO2: 28 mEq/L (ref 19–32)
CREATININE: 0.83 mg/dL (ref 0.40–1.20)
Calcium: 9.7 mg/dL (ref 8.4–10.5)
GFR: 71.88 mL/min (ref >60.00)
Glucose, Bld: 217 mg/dL — ABNORMAL HIGH (ref 70–99)
Potassium: 4.6 mEq/L (ref 3.5–5.1)
Sodium: 138 mEq/L (ref 135–145)

## 2017-04-16 LAB — MICROALBUMIN / CREATININE URINE RATIO
CREATININE, U: 138.8 mg/dL
MICROALB/CREAT RATIO: 0.9 mg/g (ref 0.0–30.0)
Microalb, Ur: 1.2 mg/dL (ref 0.0–1.9)

## 2017-04-16 LAB — TSH: TSH: 1.78 u[IU]/mL (ref 0.35–4.50)

## 2017-04-16 LAB — HEMOGLOBIN A1C: Hgb A1c MFr Bld: 8.6 % — ABNORMAL HIGH (ref 4.6–6.5)

## 2017-04-16 NOTE — Progress Notes (Signed)
Subjective:   Nina Carpenter is a 72 y.o. female who presents for Medicare Annual (Subsequent) preventive examination.  Review of Systems:  N/A Cardiac Risk Factors include: advanced age (>21men, >101 women);obesity (BMI >30kg/m2);diabetes mellitus;dyslipidemia     Objective:     Vitals: BP 140/76 (BP Location: Right Arm, Patient Position: Sitting, Cuff Size: Large) Comment: BP medication not taken  Pulse 77   Temp 98 F (36.7 C) (Oral)   Ht 5' 1.5" (1.562 m)   Wt 208 lb 4 oz (94.5 kg)   SpO2 95%   BMI 38.71 kg/m   Body mass index is 38.71 kg/m.  Advanced Directives 04/16/2017 02/22/2013 01/28/2013 01/11/2013 01/03/2013 12/28/2012  Does Patient Have a Medical Advance Directive? No Patient does not have advance directive;Patient would not like information Patient does not have advance directive Patient does not have advance directive Patient does not have advance directive Patient does not have advance directive  Would patient like information on creating a medical advance directive? Yes (MAU/Ambulatory/Procedural Areas - Information given) - - - - -  Pre-existing out of facility DNR order (yellow form or pink MOST form) - No No No No No    Tobacco Social History   Tobacco Use  Smoking Status Former Smoker  . Packs/day: 2.00  . Types: Cigarettes  . Last attempt to quit: 01/24/1968  . Years since quitting: 49.2  Smokeless Tobacco Never Used     Counseling given: No   Clinical Intake:  Pre-visit preparation completed: Yes  Pain : No/denies pain Pain Score: 0-No pain     Nutritional Status: BMI > 30  Obese Nutritional Risks: None Diabetes: Yes CBG done?: No Did pt. bring in CBG monitor from home?: No  How often do you need to have someone help you when you read instructions, pamphlets, or other written materials from your doctor or pharmacy?: 1 - Never What is the last grade level you completed in school?: educated in [place]  Interpreter Needed?:  No  Comments: pt lives with spouse Information entered by :: [employer], LPN  Past Medical History:  Diagnosis Date  . Acid reflux disease   . Allergy   . Asthma    a. Remotely.  . Chronic combined systolic and diastolic CHF (congestive heart failure) ([place])    a. Felt related to AF. EF 45-50% by echo 12/2012.  Marland Kitchen CVA (cerebral vascular accident) ([place])    a. Thromboemoblic L MCA CVA 16/1096 with no residual deficit except mild occasional word finding. b. 0-45% RICA/LICA by dopplers 40/9/81.  Marland Kitchen GERD (gastroesophageal reflux disease)   . H/O medication noncompliance    a. Per PCP note 01/06/13: Previously refused anything greater than aspirin, refused treatment for DM, and often stopped hypertensive meds and lipid meds.  Marland Kitchen Hx of rheumatic fever    a. As a child.  . Hyperlipidemia   . Hypertension   . Kidney stones    a. Remotely.  . Obesity   . PAF (paroxysmal atrial fibrillation) ([place])    a. Dx 09/2012, chronicity unclear at that time. Initially refused Xarelto due to risk of bleeding. b. Sustained CVA 12/2012 - agreeable to taking Eliquis. Tachybrady syndrome during that admission s/p Medtronic PPM implantation;  c. Failed flecainide, sotalol, amiodarone (itching), and propafenone.  . Tachy-brady syndrome ([place])    a. AF RVR with pauses >5sec requiring Medtronic PPM 12/2012.  . Tubular adenoma of colon 05/2008  . Type 2 diabetes mellitus ([place])    Past Surgical History:  Procedure  Laterality Date  . CHOLECYSTECTOMY    . DESTRUCTION TRIGEMINAL NERVE VIA NEUROLYTIC AGENT  NOv @013    Gamma Knife   . INSERT / REPLACE / REMOVE PACEMAKER    . PACEMAKER INSERTION  12-30-12   MDT dual chamber pacemaker implanted by Dr Caryl Comes for tachy-brady syndrome  . PERMANENT PACEMAKER INSERTION N/A 12/30/2012   Procedure: PERMANENT PACEMAKER INSERTION;  Surgeon: Deboraha Sprang, MD;  Location: [facility] CATH LAB;  Service: Cardiovascular;  Laterality: N/A;  . TUBAL LIGATION     ectopic preganacy    Family  History  Problem Relation Age of Onset  . Stroke Mother   . Diabetes Mother    Social History   Socioeconomic History  . Marital status: Married    Spouse name: Not on file  . Number of children: 2  . Years of education: Not on file  . Highest education level: Not on file  Occupational History  . Occupation: Paediatric nurse: [employer]  . Financial resource strain: Not on file  . Food insecurity:    Worry: Not on file    Inability: Not on file  . Transportation needs:    Medical: Not on file    Non-medical: Not on file  Tobacco Use  . Smoking status: Former Smoker    Packs/day: 2.00    Types: Cigarettes    Last attempt to quit: 01/24/1968    Years since quitting: 49.2  . Smokeless tobacco: Never Used  Substance and Sexual Activity  . Alcohol use: Yes    Alcohol/week: 0.6 oz    Types: 1 Glasses of wine per week    Comment: rare use  . Drug use: No  . Sexual activity: Yes  Lifestyle  . Physical activity:    Days per week: Not on file    Minutes per session: Not on file  . Stress: Not on file  Relationships  . Social connections:    Talks on phone: Not on file    Gets together: Not on file    Attends religious service: Not on file    Active member of club or organization: Not on file    Attends meetings of clubs or organizations: Not on file    Relationship status: Not on file  Other Topics Concern  . Not on file  Social History Narrative   Lives in [place] with her husband.  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    Outpatient Encounter Medications as of 04/16/2017  Medication Sig  . apixaban (ELIQUIS) 5 MG TABS tablet Take 1 tablet (5 mg total) by mouth 2 (two) times daily.  Marland Kitchen glipiZIDE (GLUCOTROL XL) 10 MG 24 hr tablet TAKE 1 TABLET BY MOUTH ONCE DAILY WITH BREAKFAST  . losartan (COZAAR) 100 MG tablet TAKE 1 TABLET BY MOUTH ONCE DAILY  . metoprolol tartrate (LOPRESSOR) 100 MG tablet take 1 tablet by mouth twice a day  .  mometasone (NASONEX) 50 MCG/ACT nasal spray Place 2 sprays into the nose daily.  . montelukast (SINGULAIR) 10 MG tablet take 1 tablet by mouth at bedtime  . pantoprazole (PROTONIX) 40 MG tablet Take 40 mg by mouth daily as needed (for acid reflux).  . simvastatin (ZOCOR) 80 MG tablet Take 1 tablet (80 mg total) by mouth daily.   No facility-administered encounter medications on file as of 04/16/2017.     Activities of Daily Living In your present state of health, do you have any difficulty performing the  following activities: 04/16/2017  Hearing? N  Vision? N  Difficulty concentrating or making decisions? N  Walking or climbing stairs? N  Dressing or bathing? N  Doing errands, shopping? N  Preparing Food and eating ? N  Using the Toilet? N  In the past six months, have you accidently leaked urine? N  Do you have problems with loss of bowel control? N  Managing your Medications? N  Managing your Finances? N  Housekeeping or managing your Housekeeping? N  Some recent data might be hidden    Patient Care Team: [name], MD as PCP - General    Assessment:   This is a routine wellness examination for [place].   Hearing Screening   125Hz  250Hz  500Hz  1000Hz  2000Hz  3000Hz  4000Hz  6000Hz  8000Hz   Right ear:   40 40 40  40    Left ear:   40 40 40  0    Vision Screening Comments: Last vision exam in Jun 2018 with Dr. Kerin Ransom    Exercise Activities and Dietary recommendations Current Exercise Habits: The patient does not participate in regular exercise at present, Exercise limited by: None identified  Goals    . DIET - INCREASE WATER INTAKE     Starting 04/16/2017, I will continue to drink at least 6-8 glasses of water daily.        Fall Risk Fall Risk  04/16/2017 03/20/2016 03/17/2015  Falls in the past year? No No No   Depression Screen PHQ 2/9 Scores 04/16/2017 03/20/2016 03/17/2015  PHQ - 2 Score 0 0 0  PHQ- 9 Score 0 - -     Cognitive Function MMSE - Mini Mental  State Exam 04/16/2017  Orientation to time 5  Orientation to Place 5  Registration 3  Attention/ Calculation 0  Recall 3  Language- name 2 objects 0  Language- repeat 1  Language- follow 3 step command 3  Language- read & follow direction 0  Write a sentence 0  Copy design 0  Total score 20     PLEASE NOTE: A Mini-Cog screen was completed. Maximum score is 20. A value of 0 denotes this part of [place] MMSE was not completed or the patient failed this part of the Mini-Cog screening.   Mini-Cog Screening Orientation to Time - Max 5 pts Orientation to Place - Max 5 pts Registration - Max 3 pts Recall - Max 3 pts Language Repeat - Max 1 pts Language Follow 3 Step Command - Max 3 pts     Immunization History  Administered Date(s) Administered  . Influenza Split 11/13/2011  . Influenza, High Dose Seasonal PF 11/23/2016  . Influenza,inj,Quad PF,6+ Mos 11/18/2012, 10/06/2013, 12/23/2014, 03/20/2016  . Pneumococcal Conjugate-13 10/06/2013  . Pneumococcal Polysaccharide-23 10/11/2010    Screening Tests Health Maintenance  Topic Date Due  . FOOT EXAM  04/23/2017 (Originally 05/04/2015)  . DEXA SCAN  04/17/2018 (Originally 07/28/2010)  . TETANUS/TDAP  04/17/2018 (Originally 07/27/1964)  . MAMMOGRAM  05/30/2017  . OPHTHALMOLOGY EXAM  06/23/2017  . HEMOGLOBIN A1C  10/17/2017  . COLONOSCOPY  06/16/2018  . INFLUENZA VACCINE  Completed  . Hepatitis C Screening  Completed  . PNA vac Low Risk Adult  Completed      Plan:     I have personally reviewed, addressed, and noted the following in the patient's chart:  A. Medical and social history B. Use of alcohol, tobacco or illicit drugs  C. Current medications and supplements D. Functional ability and status E.  Nutritional status F.  Physical activity G. Advance directives H. List of other physicians I.  Hospitalizations, surgeries, and ER visits in previous 12 months J.  [place] to include hearing, vision, cognitive,  depression L. Referrals and appointments - none  In addition, I have reviewed and discussed with patient certain preventive protocols, quality metrics, and best practice recommendations. A written personalized care plan for preventive services as well as general preventive health recommendations were provided to patient.  See attached scanned questionnaire for additional information.   Signed,   Lindell Noe, MHA, BS, LPN Health Coach

## 2017-04-16 NOTE — Progress Notes (Signed)
I reviewed health advisor's note, was available for consultation, and agree with documentation and plan.   Signed,  Teressa Mcglocklin T. Svetlana Bagby, MD  

## 2017-04-16 NOTE — Progress Notes (Signed)
PCP notes:   Health maintenance:  Foot exam - PCP please address at next appt Bone density - pt declined Tetanus vaccine - postponed/insurance A1C - completed  Abnormal screenings:   Hearing - failed  Hearing Screening   125Hz  250Hz  500Hz  1000Hz  2000Hz  3000Hz  4000Hz  6000Hz  8000Hz   Right ear:   40 40 40  40    Left ear:   40 40 40  0     Patient concerns:   None  Nurse concerns:  None  Next PCP appt:   04/23/17 @ 1040

## 2017-04-16 NOTE — Patient Instructions (Signed)
Nina Carpenter , Thank you for taking time to come for your Medicare Wellness Visit. I appreciate your ongoing commitment to your health goals. Please review the following plan we discussed and let me know if I can assist you in the future.   These are the goals we discussed: Goals    . DIET - INCREASE WATER INTAKE     Starting 04/16/2017, I will continue to drink at least 6-8 glasses of water daily.        This is a list of the screening recommended for you and due dates:  Health Maintenance  Topic Date Due  . Complete foot exam   04/23/2017*  . DEXA scan (bone density measurement)  04/17/2018*  . Tetanus Vaccine  04/17/2018*  . Mammogram  05/30/2017  . Eye exam for diabetics  06/23/2017  . Hemoglobin A1C  10/17/2017  . Colon Cancer Screening  06/16/2018  . Flu Shot  Completed  .  Hepatitis C: One time screening is recommended by Center for Disease Control  (CDC) for  adults born from [birth year] through 1965.   Completed  . Pneumonia vaccines  Completed  *Topic was postponed. The date shown is not the original due date.   Preventive Care for Adults  A healthy lifestyle and preventive care can promote health and wellness. Preventive health guidelines for adults include the following key practices.  . A routine yearly physical is a good way to check with your health care provider about your health and preventive screening. It is a chance to share any concerns and updates on your health and to receive a thorough exam.  . Visit your dentist for a routine exam and preventive care every 6 months. Brush your teeth twice a day and floss once a day. Good oral hygiene prevents tooth decay and gum disease.  . The frequency of eye exams is based on your age, health, family medical history, use  of contact lenses, and other factors. Follow your health care provider's recommendations for frequency of eye exams.  . Eat a healthy diet. Foods like vegetables, fruits, whole grains, low-fat dairy products,  and lean protein foods contain the nutrients you need without too many calories. Decrease your intake of foods high in solid fats, added sugars, and salt. Eat the right amount of calories for you. Get information about a proper diet from your health care provider, if necessary.  . Regular physical exercise is one of the most important things you can do for your health. Most adults should get at least 150 minutes of moderate-intensity exercise (any activity that increases your heart rate and causes you to sweat) each week. In addition, most adults need muscle-strengthening exercises on 2 or more days a week.  Silver Sneakers may be a benefit available to you. To determine eligibility, you may visit the website: www.silversneakers.com or contact program at 530-695-0148 Mon-Fri between 8AM-8PM.   . Maintain a healthy weight. The body mass index (BMI) is a screening tool to identify possible weight problems. It provides an estimate of body fat based on height and weight. Your health care provider can find your BMI and can help you achieve or maintain a healthy weight.   For adults 20 years and older: ? A BMI below 18.5 is considered underweight. ? A BMI of 18.5 to 24.9 is normal. ? A BMI of 25 to 29.9 is considered overweight. ? A BMI of 30 and above is considered obese.   . Maintain normal blood lipids and cholesterol  levels by exercising and minimizing your intake of saturated fat. Eat a balanced diet with plenty of fruit and vegetables. Blood tests for lipids and cholesterol should begin at age 53 and be repeated every 5 years. If your lipid or cholesterol levels are high, you are over 50, or you are at high risk for heart disease, you may need your cholesterol levels checked more frequently. Ongoing high lipid and cholesterol levels should be treated with medicines if diet and exercise are not working.  . If you smoke, find out from your health care provider how to quit. If you do not use tobacco,  please do not start.  . If you choose to drink alcohol, please do not consume more than 2 drinks per day. One drink is considered to be 12 ounces (355 mL) of beer, 5 ounces (148 mL) of wine, or 1.5 ounces (44 mL) of liquor.  . If you are 52-44 years old, ask your health care provider if you should take aspirin to prevent strokes.  . Use sunscreen. Apply sunscreen liberally and repeatedly throughout the day. You should seek shade when your shadow is shorter than you. Protect yourself by wearing long sleeves, pants, a wide-brimmed hat, and sunglasses year round, whenever you are outdoors.  . Once a month, do a whole body skin exam, using a mirror to look at the skin on your back. Tell your health care provider of new moles, moles that have irregular borders, moles that are larger than a pencil eraser, or moles that have changed in shape or color.

## 2017-04-23 ENCOUNTER — Encounter: Payer: Self-pay | Admitting: Family Medicine

## 2017-04-23 ENCOUNTER — Ambulatory Visit (INDEPENDENT_AMBULATORY_CARE_PROVIDER_SITE_OTHER): Payer: Medicare Other | Admitting: Family Medicine

## 2017-04-23 ENCOUNTER — Other Ambulatory Visit: Payer: Self-pay

## 2017-04-23 VITALS — BP 148/60 | HR 80 | Temp 98.7°F | Ht 61.5 in | Wt 209.8 lb

## 2017-04-23 DIAGNOSIS — E1159 Type 2 diabetes mellitus with other circulatory complications: Secondary | ICD-10-CM

## 2017-04-23 DIAGNOSIS — I10 Essential (primary) hypertension: Secondary | ICD-10-CM | POA: Diagnosis not present

## 2017-04-23 DIAGNOSIS — I639 Cerebral infarction, unspecified: Secondary | ICD-10-CM | POA: Diagnosis not present

## 2017-04-23 DIAGNOSIS — Z950 Presence of cardiac pacemaker: Secondary | ICD-10-CM | POA: Diagnosis not present

## 2017-04-23 DIAGNOSIS — I4891 Unspecified atrial fibrillation: Secondary | ICD-10-CM

## 2017-04-23 DIAGNOSIS — Z7901 Long term (current) use of anticoagulants: Secondary | ICD-10-CM | POA: Diagnosis not present

## 2017-04-23 MED ORDER — GLIPIZIDE ER 10 MG PO TB24: ORAL_TABLET | ORAL | 1 refills | Status: DC

## 2017-04-23 MED ORDER — MOMETASONE FUROATE 50 MCG/ACT NA SUSP: 2.0000 | Freq: Every day | NASAL | 2 refills | Status: DC

## 2017-04-23 NOTE — Progress Notes (Signed)
Dr. Frederico Hamman T. Lariyah Shetterly, MD, [facility] [place], [address] Phone: [phone] Fax: [phone]  04/23/2017  Patient: Nina Carpenter, MRN: [id number], DOB: 06-Dec-1945, 72 y.o.  Primary Physician:  [name], MD   Chief Complaint  Patient presents with  . Annual Exam    Part 2  . Fall    4 weeks ago  . Back Pain   Subjective:   Nina Carpenter is a 72 y.o. very pleasant female patient who presents with the following:  F/u DM, CVA, HTN:   Fell at work and tossed over and landed on her hip / buttocks. Happened four weeks ago, and then it was hurting again in her lower back. Two days ago, now getting better. Icy hot.   Diabetes Mellitus: Tolerating Medications: yes Compliance with diet: fair Exercise: minimal / intermittent Avg blood sugars at home: not checking Foot problems: none Hypoglycemia: none No nausea, vomitting, blurred vision, polyuria.  Lab Results  Component Value Date   HGBA1C 8.6 (H) 04/16/2017   HGBA1C 8.4 (H) 09/11/2016   HGBA1C 7.7 (H) 03/13/2016   Lab Results  Component Value Date   MICROALBUR 1.2 04/16/2017   LDLCALC 89 04/16/2017   CREATININE 0.83 04/16/2017    Wt Readings from Last 3 Encounters:  04/23/17 209 lb 12 oz (95.1 kg)  04/16/17 208 lb 4 oz (94.5 kg)  10/16/16 207 lb 8 oz (94.1 kg)    Body mass index is 38.99 kg/m.   HTN: Tolerating all medications without side effects Stable and at goal No CP, no sob. No HA.  BP Readings from Last 3 Encounters:  04/23/17 (!) 148/60  04/16/17 140/76  10/16/16 (!) 836/62    Basic Metabolic Panel:    Component Value Date/Time   NA 138 04/16/2017 1123   NA 139 05/09/2013 2023   K 4.6 04/16/2017 1123   K 3.9 05/09/2013 2023   CL 103 04/16/2017 1123   CL 103 05/09/2013 2023   CO2 28 04/16/2017 1123   CO2 29 05/09/2013 2023   BUN 17 04/16/2017 1123   BUN 14 05/09/2013 2023   CREATININE 0.83 04/16/2017 1123   CREATININE 0.97 05/09/2013 2023   GLUCOSE 217 (H) 04/16/2017 1123   GLUCOSE 141 (H) 05/09/2013 2023   CALCIUM 9.7 04/16/2017 1123   CALCIUM 9.5 05/09/2013 2023    Lipids: Doing well, stable. Tolerating meds fine with no SE. Panel reviewed with patient.  Lipids:    Component Value Date/Time   CHOL 161 04/16/2017 1123   TRIG 150.0 (H) 04/16/2017 1123   HDL 41.60 04/16/2017 1123   LDLDIRECT 178.0 11/18/2012 1049   VLDL 30.0 04/16/2017 1123   CHOLHDL 4 04/16/2017 1123    Lab Results  Component Value Date   ALT 26 04/16/2017   AST 17 04/16/2017   ALKPHOS 84 04/16/2017   BILITOT 0.8 04/16/2017     a1c is up again  Past Medical History, Surgical History, Social History, Family History, Problem List, Medications, and Allergies have been reviewed and updated if relevant.  Patient Active Problem List   Diagnosis Date Noted  . CVA (cerebral vascular accident) ([place]) 01/06/2013    Priority: High  . Atrial fibrillation ([place]) 12/29/2012    Priority: High  . Type 2 diabetes mellitus with vascular disease ([place]) 05/04/2008    Priority: High  . Cardiac pacemaker in situ 01/06/2013    Priority: Medium  . History of noncompliance with medical treatment  01/06/2013    Priority: Medium  . Tachy-brady syndrome ([place]) 12/29/2012    Priority: Medium  . Hx of rheumatic fever 08/07/2011    Priority: Medium  . NEURALGIA, TRIGEMINAL 01/26/2010    Priority: Medium  . Hyperlipidemia LDL goal <70 05/04/2008    Priority: Medium  . Essential hypertension 05/04/2008    Priority: Medium  . ASTHMA 05/04/2008    Priority: Medium  . OSA (obstructive sleep apnea) 05/25/2014  . Chronic anticoagulation- Eliquis 01/27/2013  . ALLERGIC RHINITIS 05/04/2008  . GERD 05/04/2008  . POSTMENOPAUSAL STATUS 05/04/2008    Past Medical History:  Diagnosis Date  . Acid reflux disease   . Allergy   . Asthma    a. Remotely.  . Chronic combined systolic and diastolic CHF (congestive heart failure) ([place])     a. Felt related to AF. EF 45-50% by echo 12/2012.  Marland Kitchen CVA (cerebral vascular accident) ([place])    a. Thromboemoblic L MCA CVA 16/1096 with no residual deficit except mild occasional word finding. b. 0-45% RICA/LICA by dopplers 40/9/81.  Marland Kitchen GERD (gastroesophageal reflux disease)   . H/O medication noncompliance    a. Per PCP note 01/06/13: Previously refused anything greater than aspirin, refused treatment for DM, and often stopped hypertensive meds and lipid meds.  Marland Kitchen Hx of rheumatic fever    a. As a child.  . Hyperlipidemia   . Hypertension   . Kidney stones    a. Remotely.  . Obesity   . PAF (paroxysmal atrial fibrillation) ([place])    a. Dx 09/2012, chronicity unclear at that time. Initially refused Xarelto due to risk of bleeding. b. Sustained CVA 12/2012 - agreeable to taking Eliquis. Tachybrady syndrome during that admission s/p Medtronic PPM implantation;  c. Failed flecainide, sotalol, amiodarone (itching), and propafenone.  . Tachy-brady syndrome ([place])    a. AF RVR with pauses >5sec requiring Medtronic PPM 12/2012.  . Tubular adenoma of colon 05/2008  . Type 2 diabetes mellitus ([place])     Past Surgical History:  Procedure Laterality Date  . CHOLECYSTECTOMY    . DESTRUCTION TRIGEMINAL NERVE VIA NEUROLYTIC AGENT  NOv @013    Gamma Knife   . INSERT / REPLACE / REMOVE PACEMAKER    . PACEMAKER INSERTION  12-30-12   MDT dual chamber pacemaker implanted by Dr Caryl Comes for tachy-brady syndrome  . PERMANENT PACEMAKER INSERTION N/A 12/30/2012   Procedure: PERMANENT PACEMAKER INSERTION;  Surgeon: Deboraha Sprang, MD;  Location: [facility] CATH LAB;  Service: Cardiovascular;  Laterality: N/A;  . TUBAL LIGATION     ectopic preganacy     Social History   Socioeconomic History  . Marital status: Married    Spouse name: Not on file  . Number of children: 2  . Years of education: Not on file  . Highest education level: Not on file  Occupational History  . Occupation: Paediatric nurse: [employer]  . Financial resource strain: Not on file  . Food insecurity:    Worry: Not on file    Inability: Not on file  . Transportation needs:    Medical: Not on file    Non-medical: Not on file  Tobacco Use  . Smoking status: Former Smoker    Packs/day: 2.00    Types: Cigarettes    Last attempt to quit: 01/24/1968    Years since quitting: 49.2  . Smokeless tobacco: Never Used  Substance and Sexual Activity  . Alcohol use: Yes    Alcohol/week: 0.6  oz    Types: 1 Glasses of wine per week    Comment: rare use  . Drug use: No  . Sexual activity: Yes  Lifestyle  . Physical activity:    Days per week: Not on file    Minutes per session: Not on file  . Stress: Not on file  Relationships  . Social connections:    Talks on phone: Not on file    Gets together: Not on file    Attends religious service: Not on file    Active member of club or organization: Not on file    Attends meetings of clubs or organizations: Not on file    Relationship status: Not on file  . Intimate partner violence:    Fear of current or ex partner: Not on file    Emotionally abused: Not on file    Physically abused: Not on file    Forced sexual activity: Not on file  Other Topics Concern  . Not on file  Social History Narrative   Lives in [place] with her husband.  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    Family History  Problem Relation Age of Onset  . Stroke Mother   . Diabetes Mother     Allergies  Allergen Reactions  . Bee Venom Anaphylaxis  . Amiodarone Itching  . Crestor [Rosuvastatin]     myalgia  . Fish Allergy Swelling    Shell fish  . Iodine Itching    itching  . Lipitor [Atorvastatin]     myalgias  . Penicillins Hives and Itching  . Ace Inhibitors Cough  . Lidocaine Hcl Palpitations    ALL CAINES given during dental procedures    Medication list reviewed and updated in full in [place].   GEN: No acute illnesses, no fevers, chills. GI: No  n/v/d, eating normally Pulm: No SOB Interactive and getting along well at home.  Otherwise, ROS is as per the HPI.  Objective:   BP (!) 148/60   Pulse 80   Temp 98.7 F (37.1 C) (Oral)   Ht 5' 1.5" (1.562 m)   Wt 209 lb 12 oz (95.1 kg)   BMI 38.99 kg/m   GEN: WDWN, NAD, Non-toxic, A & O x 3 HEENT: Atraumatic, Normocephalic. Neck supple. No masses, No LAD. Ears and Nose: No external deformity. CV: RRR, No M/G/R. No JVD. No thrill. No extra heart sounds. PULM: CTA B, no wheezes, crackles, rhonchi. No retractions. No resp. distress. No accessory muscle use. EXTR: No c/c/e NEURO Normal gait.  PSYCH: Normally interactive. Conversant. Not depressed or anxious appearing.  Calm demeanor.   ROM WNL throughput low back with ttp l4-s1 b neurovasc intact  Laboratory and Imaging Data: Results for orders placed or performed in visit on 04/16/17  Hemoglobin A1c  Result Value Ref Range   Hgb A1c MFr Bld 8.6 (H) 4.6 - 6.5 %  Microalbumin/Creatinine Ratio, Urine  Result Value Ref Range   Microalb, Ur 1.2 0.0 - 1.9 mg/dL   Creatinine,U 138.8 mg/dL   Microalb Creat Ratio 0.9 0.0 - 30.0 mg/g  Hepatic Function Panel  Result Value Ref Range   Total Bilirubin 0.8 0.2 - 1.2 mg/dL   Bilirubin, Direct 0.1 0.0 - 0.3 mg/dL   Alkaline Phosphatase 84 39 - 117 U/L   AST 17 0 - 37 U/L   ALT 26 0 - 35 U/L   Total Protein 7.1 6.0 - 8.3 g/dL   Albumin 4.1 3.5 - 5.2  g/dL  Basic Metabolic Panel  Result Value Ref Range   Sodium 138 135 - 145 mEq/L   Potassium 4.6 3.5 - 5.1 mEq/L   Chloride 103 96 - 112 mEq/L   CO2 28 19 - 32 mEq/L   Glucose, Bld 217 (H) 70 - 99 mg/dL   BUN 17 6 - 23 mg/dL   Creatinine, Ser 0.83 0.40 - 1.20 mg/dL   Calcium 9.7 8.4 - 10.5 mg/dL   GFR 71.88 >60.00 mL/min  CBC with Differential  Result Value Ref Range   WBC 5.0 4.0 - 10.5 K/uL   RBC 4.72 3.87 - 5.11 Mil/uL   Hemoglobin 14.6 12.0 - 15.0 g/dL   HCT 43.1 36.0 - 46.0 %   MCV 91.3 78.0 - 100.0 fl   MCHC 33.8 30.0 -  36.0 g/dL   RDW 13.1 11.5 - 15.5 %   Platelets 201.0 150.0 - 400.0 K/uL   Neutrophils Relative % 54.3 43.0 - 77.0 %   Lymphocytes Relative 34.9 12.0 - 46.0 %   Monocytes Relative 7.6 3.0 - 12.0 %   Eosinophils Relative 2.2 0.0 - 5.0 %   Basophils Relative 1.0 0.0 - 3.0 %   Neutro Abs 2.7 1.4 - 7.7 K/uL   Lymphs Abs 1.7 0.7 - 4.0 K/uL   Monocytes Absolute 0.4 0.1 - 1.0 K/uL   Eosinophils Absolute 0.1 0.0 - 0.7 K/uL   Basophils Absolute 0.0 0.0 - 0.1 K/uL  TSH  Result Value Ref Range   TSH 1.78 0.35 - 4.50 uIU/mL  Lipid Panel  Result Value Ref Range   Cholesterol 161 0 - 200 mg/dL   Triglycerides 150.0 (H) 0.0 - 149.0 mg/dL   HDL 41.60 >39.00 mg/dL   VLDL 30.0 0.0 - 40.0 mg/dL   LDL Cholesterol 89 0 - 99 mg/dL   Total CHOL/HDL Ratio 4    NonHDL 119.37      Assessment and Plan:   Type 2 diabetes mellitus with vascular disease (HCC)  Essential hypertension  Cardiac pacemaker in situ  Atrial fibrillation, unspecified type (HCC)  Cerebrovascular accident (CVA), unspecified mechanism ([place])  Chronic anticoagulation- Eliquis  A1c is doing somewhat worse.  She was previously unable to tolerate metformin.  I am going to increase her glipizide.  Allergy flareup.  Mechanical back pain, reassurance, continue with basic range of motion, massage, heat, topicals.  Tylenol if needed.  If symptoms persist, PT would be reasonable.  Follow-up: Return in about 6 months (around 10/23/2017).  Meds ordered this encounter  Medications  . mometasone (NASONEX) 50 MCG/ACT nasal spray    Sig: Place 2 sprays into the nose daily.    Dispense:  17 g    Refill:  2  . glipiZIDE (GLUCOTROL XL) 10 MG 24 hr tablet    Sig: TAKE 2 TABLET BY MOUTH ONCE DAILY WITH BREAKFAST    Dispense:  180 tablet    Refill:  1   Medications Discontinued During This Encounter  Medication Reason  . mometasone (NASONEX) 50 MCG/ACT nasal spray Reorder  . glipiZIDE (GLUCOTROL XL) 10 MG 24 hr tablet Reorder   No  orders of the defined types were placed in this encounter.   Signed,  Maud Deed. Maxamilian Amadon, MD   Allergies as of 04/23/2017      Reactions   Bee Venom Anaphylaxis   Amiodarone Itching   Crestor [rosuvastatin]    myalgia   Fish Allergy Swelling   Shell fish   Iodine Itching   itching   Lipitor [  atorvastatin]    myalgias   Penicillins Hives, Itching   Ace Inhibitors Cough   Lidocaine Hcl Palpitations   ALL CAINES given during dental procedures      Medication List        Accurate as of 04/23/17 11:59 PM. Always use your most recent med list.          apixaban 5 MG Tabs tablet Commonly known as:  ELIQUIS Take 1 tablet (5 mg total) by mouth 2 (two) times daily.   glipiZIDE 10 MG 24 hr tablet Commonly known as:  GLUCOTROL XL TAKE 2 TABLET BY MOUTH ONCE DAILY WITH BREAKFAST   losartan 100 MG tablet Commonly known as:  COZAAR TAKE 1 TABLET BY MOUTH ONCE DAILY   metoprolol tartrate 100 MG tablet Commonly known as:  LOPRESSOR take 1 tablet by mouth twice a day   mometasone 50 MCG/ACT nasal spray Commonly known as:  NASONEX Place 2 sprays into the nose daily.   montelukast 10 MG tablet Commonly known as:  SINGULAIR take 1 tablet by mouth at bedtime   pantoprazole 40 MG tablet Commonly known as:  PROTONIX Take 40 mg by mouth daily as needed (for acid reflux).   simvastatin 80 MG tablet Commonly known as:  ZOCOR Take 1 tablet (80 mg total) by mouth daily.

## 2017-05-22 ENCOUNTER — Other Ambulatory Visit: Payer: Self-pay

## 2017-05-22 MED ORDER — SIMVASTATIN 80 MG PO TABS: 80.0000 mg | ORAL_TABLET | Freq: Every day | ORAL | 0 refills | Status: DC

## 2017-06-17 ENCOUNTER — Other Ambulatory Visit: Payer: Self-pay | Admitting: Internal Medicine

## 2017-06-19 ENCOUNTER — Other Ambulatory Visit: Payer: Self-pay | Admitting: Family Medicine

## 2017-06-20 ENCOUNTER — Other Ambulatory Visit: Payer: Self-pay | Admitting: Family Medicine

## 2017-06-20 ENCOUNTER — Telehealth: Payer: Self-pay

## 2017-06-20 ENCOUNTER — Telehealth: Payer: Self-pay | Admitting: Family Medicine

## 2017-06-20 NOTE — Telephone Encounter (Addendum)
Refill for Metoprolol was sent in earlier today.  Lisinopril was refilled on 03/23/2017 for #90 with 1 refills.  Should have refill remaining on that medication.Marland Kitchen  Pharmacy advised of that earlier today as well.

## 2017-06-20 NOTE — Telephone Encounter (Signed)
l mom to schedule 1 yr f/u with dr Caryl Comes

## 2017-06-20 NOTE — Telephone Encounter (Signed)
-----   Message from Anselm Pancoast, [place] sent at 06/19/2017 11:08 AM EDT ----- Please contact patient for a follow up with Dr. Caryl Comes.  Patient was to be seen in 04/2017.  Thanks, Ivin Booty

## 2017-06-20 NOTE — Telephone Encounter (Signed)
Copied from [place] [id number]. Topic: Quick Communication - Rx Refill/Question >> Jun 20, 2017  8:43 AM Pricilla Handler wrote: Medications: Losartan (COZAAR) 100 MG tablet and Metoprolol tartrate (LOPRESSOR) 100 MG tablet   Has the patient contacted their pharmacy? Yes.   (Agent: If no, request that the patient contact the pharmacy for the refill.) (Agent: If yes, when and what did the pharmacy advise?)  Preferred Pharmacy (with phone number or street name): [id number] - [place], [place] - [place] [phone] (Phone) [phone] (Fax)  Agent: Please be advised that RX refills may take up to 3 business days. We ask that you follow-up with your pharmacy.

## 2017-06-21 NOTE — Telephone Encounter (Signed)
Pt scheduled for July 9th

## 2017-06-22 ENCOUNTER — Other Ambulatory Visit: Payer: Self-pay

## 2017-06-22 NOTE — Telephone Encounter (Signed)
Please review for Eliquis refill ° °

## 2017-06-25 MED ORDER — APIXABAN 5 MG PO TABS: 5.0000 mg | ORAL_TABLET | Freq: Two times a day (BID) | ORAL | 1 refills | Status: DC

## 2017-07-31 ENCOUNTER — Ambulatory Visit (INDEPENDENT_AMBULATORY_CARE_PROVIDER_SITE_OTHER): Payer: Medicare Other | Admitting: Internal Medicine

## 2017-07-31 ENCOUNTER — Encounter: Payer: Self-pay | Admitting: Internal Medicine

## 2017-07-31 VITALS — BP 160/78 | HR 77 | Ht 62.5 in | Wt 210.2 lb

## 2017-07-31 DIAGNOSIS — I495 Sick sinus syndrome: Secondary | ICD-10-CM | POA: Diagnosis not present

## 2017-07-31 DIAGNOSIS — Z950 Presence of cardiac pacemaker: Secondary | ICD-10-CM

## 2017-07-31 DIAGNOSIS — I639 Cerebral infarction, unspecified: Secondary | ICD-10-CM | POA: Diagnosis not present

## 2017-07-31 DIAGNOSIS — I480 Paroxysmal atrial fibrillation: Secondary | ICD-10-CM

## 2017-07-31 LAB — CUP PACEART INCLINIC DEVICE CHECK
Battery Remaining Longevity: 70 mo
Brady Statistic AP VP Percent: 0.08 %
Brady Statistic AP VS Percent: 24.53 %
Brady Statistic AS VS Percent: 75.36 %
Implantable Lead Implant Date: 20141208
Implantable Lead Implant Date: 20141208
Implantable Lead Location: 753860
Implantable Lead Model: 5076
Lead Channel Impedance Value: 361 Ohm
Lead Channel Impedance Value: 399 Ohm
Lead Channel Impedance Value: 399 Ohm
Lead Channel Impedance Value: 437 Ohm
Lead Channel Pacing Threshold Amplitude: 0.5 V
Lead Channel Pacing Threshold Pulse Width: 0.4 ms
Lead Channel Sensing Intrinsic Amplitude: 7.125 mV
Lead Channel Setting Pacing Amplitude: 2 V
MDC IDC LEAD LOCATION: 753859
MDC IDC MSMT BATTERY VOLTAGE: 3 V
MDC IDC MSMT LEADCHNL RA PACING THRESHOLD AMPLITUDE: 0.5 V
MDC IDC MSMT LEADCHNL RA PACING THRESHOLD PULSEWIDTH: 0.4 ms
MDC IDC MSMT LEADCHNL RA SENSING INTR AMPL: 2.5 mV
MDC IDC PG IMPLANT DT: 20141208
MDC IDC SESS DTM: 20190709122707
MDC IDC SET LEADCHNL RV PACING AMPLITUDE: 2.5 V
MDC IDC SET LEADCHNL RV PACING PULSEWIDTH: 0.4 ms
MDC IDC SET LEADCHNL RV SENSING SENSITIVITY: 2 mV
MDC IDC STAT BRADY AS VP PERCENT: 0.03 %
MDC IDC STAT BRADY RA PERCENT PACED: 24.55 %
MDC IDC STAT BRADY RV PERCENT PACED: 0.11 %

## 2017-07-31 NOTE — Progress Notes (Signed)
Patient Care Team: [name], MD as PCP - General   HPI  Nina Carpenter is a 72 y.o. female Seen in follow-up for atrial fibrillation and tachybradysyndrome for which she underwent pacing 2014. [facility]VI at [facility] 2015 There is been a marked diminution in atrial fibrillation symptoms and by device interrogation burden as well. She does have some nonsustained atrial tachycardia/slow flutter    Thromboembolic risk factors ( age -75, HTN-1, TIA/CVA-2, DM-1, Gender-1) for a CHADSVASc Score of 6   Most recent LDL 90<<,115;  HgB A1c 6.9>>7.7 since 2/17     Date Cr K Hgb  3/19 0.83 4.6 14.6  The patient denies chest pain, shortness of breath, nocturnal dyspnea, orthopnea.  She has peripheral edema with prolonged standing.  There have been no palpitations, lightheadedness or syncope.    She fell recently trying to move her back.  She is had problems with her hip  Past Medical History:  Diagnosis Date  . Acid reflux disease   . Allergy   . Asthma    a. Remotely.  . Chronic combined systolic and diastolic CHF (congestive heart failure) ([place])    a. Felt related to AF. EF 45-50% by echo 12/2012.  Marland Kitchen CVA (cerebral vascular accident) ([place])    a. Thromboemoblic L MCA CVA 60/7371 with no residual deficit except mild occasional word finding. b. 0-62% RICA/LICA by dopplers 69/4/85.  Marland Kitchen GERD (gastroesophageal reflux disease)   . H/O medication noncompliance    a. Per PCP note 01/06/13: Previously refused anything greater than aspirin, refused treatment for DM, and often stopped hypertensive meds and lipid meds.  Marland Kitchen Hx of rheumatic fever    a. As a child.  . Hyperlipidemia   . Hypertension   . Kidney stones    a. Remotely.  . Obesity   . PAF (paroxysmal atrial fibrillation) ([place])    a. Dx 09/2012, chronicity unclear at that time. Initially refused Xarelto due to risk of bleeding. b. Sustained CVA 12/2012 - agreeable to taking Eliquis. Tachybrady syndrome during that admission s/p  Medtronic PPM implantation;  c. Failed flecainide, sotalol, amiodarone (itching), and propafenone.  . Tachy-brady syndrome ([place])    a. AF RVR with pauses >5sec requiring Medtronic PPM 12/2012.  . Tubular adenoma of colon 05/2008  . Type 2 diabetes mellitus ([place])     Past Surgical History:  Procedure Laterality Date  . CHOLECYSTECTOMY    . DESTRUCTION TRIGEMINAL NERVE VIA NEUROLYTIC AGENT  NOv @013    Gamma Knife   . INSERT / REPLACE / REMOVE PACEMAKER    . PACEMAKER INSERTION  12-30-12   MDT dual chamber pacemaker implanted by Dr Caryl Comes for tachy-brady syndrome  . PERMANENT PACEMAKER INSERTION N/A 12/30/2012   Procedure: PERMANENT PACEMAKER INSERTION;  Surgeon: Deboraha Sprang, MD;  Location: [facility] CATH LAB;  Service: Cardiovascular;  Laterality: N/A;  . TUBAL LIGATION     ectopic preganacy     Current Outpatient Medications  Medication Sig Dispense Refill  . apixaban (ELIQUIS) 5 MG TABS tablet Take 1 tablet (5 mg total) by mouth 2 (two) times daily. 60 tablet 1  . glipiZIDE (GLUCOTROL XL) 10 MG 24 hr tablet TAKE 2 TABLET BY MOUTH ONCE DAILY WITH BREAKFAST 180 tablet 1  . losartan (COZAAR) 100 MG tablet TAKE 1 TABLET BY MOUTH ONCE DAILY 90 tablet 1  . metoprolol tartrate (LOPRESSOR) 100 MG tablet TAKE 1 TABLET BY MOUTH TWICE A DAY 180 tablet 1  . mometasone (NASONEX) 50  MCG/ACT nasal spray Place 2 sprays into the nose daily. (Patient taking differently: Place 2 sprays into the nose as needed. ) 17 g 2  . montelukast (SINGULAIR) 10 MG tablet take 1 tablet by mouth at bedtime (Patient taking differently: take 1 tablet by mouth at bedtime prn) 90 tablet 3  . pantoprazole (PROTONIX) 40 MG tablet Take 40 mg by mouth daily as needed (for acid reflux).    . simvastatin (ZOCOR) 80 MG tablet TAKE 1 TABLET BY MOUTH DAILY 30 tablet 0   No current facility-administered medications for this visit.     Allergies  Allergen Reactions  . Bee Venom Anaphylaxis  . Amiodarone Itching  . Crestor  [Rosuvastatin]     myalgia  . Fish Allergy Swelling    Shell fish  . Iodine Itching    itching  . Lipitor [Atorvastatin]     myalgias  . Penicillins Hives and Itching  . Ace Inhibitors Cough  . Lidocaine Hcl Palpitations    ALL CAINES given during dental procedures    Review of Systems negative except from HPI and PMH  Physical Exam  BP (!) 160/78 (BP Location: Left Arm, Patient Position: Sitting, Cuff Size: Large)   Pulse 77   Ht 5' 2.5" (1.588 m)   Wt 210 lb 4 oz (95.4 kg)   BMI 37.84 kg/m  Well developed and nourished in no acute distress HENT normal Neck supple with JVP-flat Clear Regular rate and rhythm, no murmurs or gallops Abd-soft with active BS No Clubbing cyanosis edema Skin-warm and dry A & Oriented  Grossly normal sensory and motor function    ECG demonstrates atrial pacing at 77 Interval 21/07/41  Assessment and  Plan  Atrial fibrillation-paroxysmal status post PVI [facility] 4/15  Hypertension  Hyperlipidemia  HFpEF  Pacemaker Medtronic The patient's device was interrogated.  The information was reviewed. No changes were made in the programming.      Blood pressure remains elevated.  Her PCP has been following this along.  On Anticoagulation;  No bleeding issues   No intercurrent atrial fibrillation or flutter  LDL much improved

## 2017-07-31 NOTE — Patient Instructions (Signed)
Medication Instructions: - Your physician recommends that you continue on your current medications as directed. Please refer to the Current Medication list given to you today.  Labwork: - none ordered  Procedures/Testing: - none ordered  Follow-Up: - Remote monitoring is used to monitor your Pacemaker of ICD from home. This monitoring reduces the number of office visits required to check your device to one time per year. It allows [name] to keep an eye on the functioning of your device to ensure it is working properly. You are scheduled for a device check from home on 10/30/17. You may send your transmission at any time that day. If you have a wireless device, the transmission will be sent automatically. After your physician reviews your transmission, you will receive a postcard with your next transmission date.  - Your physician wants you to follow-up in: 1 year with Dr. Caryl Comes. You will receive a reminder letter in the mail two months in advance. If you don't receive a letter, please call our office to schedule the follow-up appointment.   Any Additional Special Instructions Will Be Listed Below (If Applicable).     If you need a refill on your cardiac medications before your next appointment, please call your pharmacy.

## 2017-08-30 ENCOUNTER — Other Ambulatory Visit: Payer: Self-pay

## 2017-08-30 NOTE — Telephone Encounter (Signed)
Please review for refill.  

## 2017-08-31 MED ORDER — APIXABAN 5 MG PO TABS: 5.0000 mg | ORAL_TABLET | Freq: Two times a day (BID) | ORAL | 5 refills | Status: DC

## 2017-09-10 ENCOUNTER — Other Ambulatory Visit: Payer: Self-pay | Admitting: Internal Medicine

## 2017-09-14 ENCOUNTER — Other Ambulatory Visit: Payer: Self-pay | Admitting: Family Medicine

## 2017-10-04 ENCOUNTER — Other Ambulatory Visit: Payer: Self-pay | Admitting: Family Medicine

## 2017-10-05 NOTE — Telephone Encounter (Signed)
Electronic refill request Last refill 03/23/17 #90/1 Last office visit 04/23/17 See allergy/contraindication

## 2017-10-10 ENCOUNTER — Other Ambulatory Visit: Payer: Self-pay | Admitting: Family Medicine

## 2017-10-10 DIAGNOSIS — E1159 Type 2 diabetes mellitus with other circulatory complications: Secondary | ICD-10-CM

## 2017-10-15 ENCOUNTER — Other Ambulatory Visit: Payer: Medicare Other

## 2017-10-22 ENCOUNTER — Encounter: Payer: Self-pay | Admitting: Family Medicine

## 2017-10-22 ENCOUNTER — Ambulatory Visit (INDEPENDENT_AMBULATORY_CARE_PROVIDER_SITE_OTHER): Payer: Medicare Other | Admitting: Family Medicine

## 2017-10-22 VITALS — BP 150/82 | HR 92 | Temp 98.4°F | Ht 61.5 in | Wt 209.5 lb

## 2017-10-22 DIAGNOSIS — I4891 Unspecified atrial fibrillation: Secondary | ICD-10-CM | POA: Diagnosis not present

## 2017-10-22 DIAGNOSIS — I639 Cerebral infarction, unspecified: Secondary | ICD-10-CM

## 2017-10-22 DIAGNOSIS — I10 Essential (primary) hypertension: Secondary | ICD-10-CM

## 2017-10-22 DIAGNOSIS — E1159 Type 2 diabetes mellitus with other circulatory complications: Secondary | ICD-10-CM

## 2017-10-22 LAB — POCT GLYCOSYLATED HEMOGLOBIN (HGB A1C): HEMOGLOBIN A1C: 9.7 % — AB (ref 4.0–5.6)

## 2017-10-22 MED ORDER — SITAGLIPTIN PHOSPHATE 100 MG PO TABS: 100.0000 mg | ORAL_TABLET | Freq: Every day | ORAL | 1 refills | Status: DC

## 2017-10-22 NOTE — Patient Instructions (Signed)
Write down your BP readings every 3 or 4 days, and bring in to your next appointment.

## 2017-10-22 NOTE — Progress Notes (Signed)
Dr. Frederico Hamman T. Zelphia Glover, MD, [facility] [place], [address] Phone: [phone] Fax: [phone]  10/22/2017  Patient: Nina Carpenter, MRN: [id number], DOB: 1945-09-19, 72 y.o.  Primary Physician:  [name], MD   Chief Complaint  Patient presents with  . Diabetes   Subjective:   Nina Carpenter is a 72 y.o. very pleasant female patient who presents with the following:  Diabetes Mellitus: Tolerating Medications: yes Compliance with diet: fair Exercise: minimal / intermittent Avg blood sugars at home: not checking Foot problems: none Hypoglycemia: none No nausea, vomitting, blurred vision, polyuria.  Unable to tol metformin  Lab Results  Component Value Date   HGBA1C 9.7 (A) 10/22/2017   HGBA1C 8.6 (H) 04/16/2017   HGBA1C 8.4 (H) 09/11/2016   Lab Results  Component Value Date   MICROALBUR 1.2 04/16/2017   LDLCALC 89 04/16/2017   CREATININE 0.83 04/16/2017    Wt Readings from Last 3 Encounters:  10/22/17 209 lb 8 oz (95 kg)  07/31/17 210 lb 4 oz (95.4 kg)  04/23/17 209 lb 12 oz (95.1 kg)    Body mass index is 38.94 kg/m.   HTN: Tolerating all medications without side effects Not at goal No CP, no sob. No HA.  BP Readings from Last 3 Encounters:  10/22/17 (!) 150/82  07/31/17 (!) 160/78  04/23/17 (!) 702/63    Basic Metabolic Panel:    Component Value Date/Time   NA 138 04/16/2017 1123   NA 139 05/09/2013 2023   K 4.6 04/16/2017 1123   K 3.9 05/09/2013 2023   CL 103 04/16/2017 1123   CL 103 05/09/2013 2023   CO2 28 04/16/2017 1123   CO2 29 05/09/2013 2023   BUN 17 04/16/2017 1123   BUN 14 05/09/2013 2023   CREATININE 0.83 04/16/2017 1123   CREATININE 0.97 05/09/2013 2023   GLUCOSE 217 (H) 04/16/2017 1123   GLUCOSE 141 (H) 05/09/2013 2023   CALCIUM 9.7 04/16/2017 1123   CALCIUM 9.5 05/09/2013 2023     Past Medical History, Surgical History, Social History, Family  History, Problem List, Medications, and Allergies have been reviewed and updated if relevant.  Patient Active Problem List   Diagnosis Date Noted  . CVA (cerebral vascular accident) ([place]) 01/06/2013    Priority: High  . Atrial fibrillation ([place]) 12/29/2012    Priority: High  . Type 2 diabetes mellitus with vascular disease ([place]) 05/04/2008    Priority: High  . Cardiac pacemaker in situ 01/06/2013    Priority: Medium  . History of noncompliance with medical treatment 01/06/2013    Priority: Medium  . Tachy-brady syndrome ([place]) 12/29/2012    Priority: Medium  . Hx of rheumatic fever 08/07/2011    Priority: Medium  . NEURALGIA, TRIGEMINAL 01/26/2010    Priority: Medium  . Hyperlipidemia LDL goal <70 05/04/2008    Priority: Medium  . Essential hypertension 05/04/2008    Priority: Medium  . ASTHMA 05/04/2008    Priority: Medium  . OSA (obstructive sleep apnea) 05/25/2014  . Chronic anticoagulation- Eliquis 01/27/2013  . ALLERGIC RHINITIS 05/04/2008  . GERD 05/04/2008  . POSTMENOPAUSAL STATUS 05/04/2008    Past Medical History:  Diagnosis Date  . Acid reflux disease   . Allergy   . Asthma    a. Remotely.  . Chronic combined systolic and diastolic CHF (congestive heart failure) ([place])    a. Felt related to AF. EF 45-50% by echo 12/2012.  Marland Kitchen CVA (  cerebral vascular accident) ([place])    a. Thromboemoblic L MCA CVA 74/2595 with no residual deficit except mild occasional word finding. b. 6-38% RICA/LICA by dopplers 75/6/43.  Marland Kitchen GERD (gastroesophageal reflux disease)   . H/O medication noncompliance    a. Per PCP note 01/06/13: Previously refused anything greater than aspirin, refused treatment for DM, and often stopped hypertensive meds and lipid meds.  Marland Kitchen Hx of rheumatic fever    a. As a child.  . Hyperlipidemia   . Hypertension   . Kidney stones    a. Remotely.  . Obesity   . PAF (paroxysmal atrial fibrillation) ([place])    a. Dx 09/2012, chronicity unclear at that time. Initially  refused Xarelto due to risk of bleeding. b. Sustained CVA 12/2012 - agreeable to taking Eliquis. Tachybrady syndrome during that admission s/p Medtronic PPM implantation;  c. Failed flecainide, sotalol, amiodarone (itching), and propafenone.  . Tachy-brady syndrome ([place])    a. AF RVR with pauses >5sec requiring Medtronic PPM 12/2012.  . Tubular adenoma of colon 05/2008  . Type 2 diabetes mellitus ([place])     Past Surgical History:  Procedure Laterality Date  . CHOLECYSTECTOMY    . DESTRUCTION TRIGEMINAL NERVE VIA NEUROLYTIC AGENT  NOv @013    Gamma Knife   . INSERT / REPLACE / REMOVE PACEMAKER    . PACEMAKER INSERTION  12-30-12   MDT dual chamber pacemaker implanted by Dr Caryl Comes for tachy-brady syndrome  . PERMANENT PACEMAKER INSERTION N/A 12/30/2012   Procedure: PERMANENT PACEMAKER INSERTION;  Surgeon: Deboraha Sprang, MD;  Location: [facility] CATH LAB;  Service: Cardiovascular;  Laterality: N/A;  . TUBAL LIGATION     ectopic preganacy     Social History   Socioeconomic History  . Marital status: Married    Spouse name: Not on file  . Number of children: 2  . Years of education: Not on file  . Highest education level: Not on file  Occupational History  . Occupation: Paediatric nurse: [employer]  . Financial resource strain: Not on file  . Food insecurity:    Worry: Not on file    Inability: Not on file  . Transportation needs:    Medical: Not on file    Non-medical: Not on file  Tobacco Use  . Smoking status: Former Smoker    Packs/day: 2.00    Types: Cigarettes    Last attempt to quit: 01/24/1968    Years since quitting: 49.7  . Smokeless tobacco: Never Used  Substance and Sexual Activity  . Alcohol use: Yes    Alcohol/week: 1.0 standard drinks    Types: 1 Glasses of wine per week    Comment: rare use  . Drug use: No  . Sexual activity: Yes  Lifestyle  . Physical activity:    Days per week: Not on file    Minutes per session: Not on file  . Stress:  Not on file  Relationships  . Social connections:    Talks on phone: Not on file    Gets together: Not on file    Attends religious service: Not on file    Active member of club or organization: Not on file    Attends meetings of clubs or organizations: Not on file    Relationship status: Not on file  . Intimate partner violence:    Fear of current or ex partner: Not on file    Emotionally abused: Not on file    Physically abused:  Not on file    Forced sexual activity: Not on file  Other Topics Concern  . Not on file  Social History Narrative   Lives in [place] with her husband.  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    Family History  Problem Relation Age of Onset  . Stroke Mother   . Diabetes Mother     Allergies  Allergen Reactions  . Bee Venom Anaphylaxis  . Amiodarone Itching  . Crestor [Rosuvastatin]     myalgia  . Fish Allergy Swelling    Shell fish  . Iodine Itching    itching  . Lipitor [Atorvastatin]     myalgias  . Penicillins Hives and Itching  . Ace Inhibitors Cough  . Lidocaine Hcl Palpitations    ALL CAINES given during dental procedures    Medication list reviewed and updated in full in [place].   GEN: No acute illnesses, no fevers, chills. GI: No n/v/d, eating normally Pulm: No SOB Interactive and getting along well at home.  Otherwise, ROS is as per the HPI.  Objective:   BP (!) 150/82   Pulse 92   Temp 98.4 F (36.9 C) (Oral)   Ht 5' 1.5" (1.562 m)   Wt 209 lb 8 oz (95 kg)   BMI 38.94 kg/m   GEN: WDWN, NAD, Non-toxic, A & O x 3 HEENT: Atraumatic, Normocephalic. Neck supple. No masses, No LAD. Ears and Nose: No external deformity. CV: RRR, No M/G/R. No JVD. No thrill. No extra heart sounds. PULM: CTA B, no wheezes, crackles, rhonchi. No retractions. No resp. distress. No accessory muscle use. EXTR: No c/c/e NEURO Normal gait.  PSYCH: Normally interactive. Conversant. Not depressed or anxious appearing.  Calm  demeanor.   Laboratory and Imaging Data:  Results for orders placed or performed in visit on 10/22/17  POCT glycosylated hemoglobin (Hb A1C)  Result Value Ref Range   Hemoglobin A1C 9.7 (A) 4.0 - 5.6 %   HbA1c POC (<> result, manual entry)     HbA1c, POC (prediabetic range)     HbA1c, POC (controlled diabetic range)       Assessment and Plan:   Type 2 diabetes mellitus with vascular disease ([place]) - Plan: POCT glycosylated hemoglobin (Hb A1C)  Essential hypertension  Atrial fibrillation, unspecified type ([place])  Add januvia Resistant to adding BP meds - check home readings.   Weight loss  Follow-up: Return in about 3 months (around 01/21/2018) for DM recheck, a1c at time of OV, come 20 mins before.  Meds ordered this encounter  Medications  . sitaGLIPtin (JANUVIA) 100 MG tablet    Sig: Take 1 tablet (100 mg total) by mouth daily.    Dispense:  90 tablet    Refill:  1    Signed,  Evah Rashid T. Lamontae Ricardo, MD   Allergies as of 10/22/2017      Reactions   Bee Venom Anaphylaxis   Amiodarone Itching   Crestor [rosuvastatin]    myalgia   Fish Allergy Swelling   Shell fish   Iodine Itching   itching   Lipitor [atorvastatin]    myalgias   Penicillins Hives, Itching   Ace Inhibitors Cough   Lidocaine Hcl Palpitations   ALL CAINES given during dental procedures      Medication List        Accurate as of 10/22/17  9:46 AM. Always use your most recent med list.          apixaban 5 MG  Tabs tablet Commonly known as:  ELIQUIS Take 1 tablet (5 mg total) by mouth 2 (two) times daily.   glipiZIDE 10 MG 24 hr tablet Commonly known as:  GLUCOTROL XL TAKE 2 TABLET BY MOUTH ONCE DAILY WITH BREAKFAST   losartan 100 MG tablet Commonly known as:  COZAAR TAKE 1 TABLET BY MOUTH ONCE DAILY   metoprolol tartrate 100 MG tablet Commonly known as:  LOPRESSOR TAKE 1 TABLET BY MOUTH TWICE A DAY   mometasone 50 MCG/ACT nasal spray Commonly known as:  NASONEX Place 2 sprays into  the nose daily.   montelukast 10 MG tablet Commonly known as:  SINGULAIR take 1 tablet by mouth at bedtime   pantoprazole 40 MG tablet Commonly known as:  PROTONIX Take 40 mg by mouth daily as needed (for acid reflux).   simvastatin 80 MG tablet Commonly known as:  ZOCOR TAKE 1 TABLET BY MOUTH DAILY   sitaGLIPtin 100 MG tablet Commonly known as:  JANUVIA Take 1 tablet (100 mg total) by mouth daily.

## 2017-10-26 ENCOUNTER — Telehealth: Payer: Self-pay | Admitting: Family Medicine

## 2017-10-26 MED ORDER — GLIPIZIDE ER 10 MG PO TB24: ORAL_TABLET | ORAL | 1 refills | Status: DC

## 2017-10-26 NOTE — Telephone Encounter (Signed)
Prescription request

## 2017-10-26 NOTE — Telephone Encounter (Signed)
Copied from [place] [id number]. Topic: Quick Communication - Rx Refill/Question >> Oct 26, 2017  8:48 AM Keene Breath wrote: Medication: glipiZIDE (GLUCOTROL XL) 10 MG 24 hr tablet  Patient called to refill the above medication.  CB# [phone]  Preferred Pharmacy (with phone number or street name): [id number] - [address], [place] [phone] (Phone) [phone] (Fax)

## 2017-10-26 NOTE — Telephone Encounter (Signed)
No, I would start the [place] and work on weight loss.   Lab Results  Component Value Date   HGBA1C 9.7 (A) 10/22/2017    Even with max dosage of both of these medications, I am not sure her blood sugar will get to goal.   And she has had a major stroke in the past - high risk.

## 2017-10-26 NOTE — Telephone Encounter (Signed)
Pt seen on 10/22/17 for DM appt; pt said Dr Lorelei Pont added Celesta Gentile; pt said she is serious about dieting and has lost 4 lbs since 10/22/17. Pt wants to know since pt has already lost 4 lbs and pt continues to diet if Dr Lorelei Pont would think it is OK not to start Januvia yet and see what BS is when has appt and lab recked 01/28/2018. Pt plans to continue taking the glipizide which was refilled per [facility] [address]. Pt request cb after Dr Lorelei Pont reviews.

## 2017-10-26 NOTE — Telephone Encounter (Signed)
Left message for Ms. Yankovich that Dr. Lorelei Pont wants her to go ahead and  start the [place] and continue  Working on weight loss.   Recent Labs       Lab Results  Component Value Date   HGBA1C 9.7 (A) 10/22/2017      Even with max dosage of both of these medications Dr. Lorelei Pont is not sure her blood sugar will get to goal,  and she has had a major stroke in the past which make her high risk. I ask that she call us back with any questions or concerns.

## 2017-10-30 ENCOUNTER — Ambulatory Visit (INDEPENDENT_AMBULATORY_CARE_PROVIDER_SITE_OTHER): Payer: Medicare Other | Admitting: *Deleted

## 2017-10-30 DIAGNOSIS — I480 Paroxysmal atrial fibrillation: Secondary | ICD-10-CM

## 2017-10-30 DIAGNOSIS — I495 Sick sinus syndrome: Secondary | ICD-10-CM

## 2017-10-31 NOTE — Progress Notes (Signed)
Remote pacemaker transmission.   

## 2017-11-08 ENCOUNTER — Encounter: Payer: Self-pay | Admitting: Cardiology

## 2017-11-22 LAB — CUP PACEART REMOTE DEVICE CHECK
Battery Remaining Longevity: 69 mo
Brady Statistic AP VP Percent: 0.09 %
Brady Statistic AP VS Percent: 27.34 %
Brady Statistic AS VS Percent: 72.54 %
Brady Statistic RV Percent Paced: 0.12 %
Implantable Lead Implant Date: 20141208
Implantable Lead Implant Date: 20141208
Implantable Lead Location: 753859
Implantable Lead Model: 5076
Lead Channel Impedance Value: 342 Ohm
Lead Channel Impedance Value: 380 Ohm
Lead Channel Impedance Value: 418 Ohm
Lead Channel Impedance Value: 475 Ohm
Lead Channel Pacing Threshold Amplitude: 0.625 V
Lead Channel Pacing Threshold Amplitude: 0.625 V
Lead Channel Pacing Threshold Pulse Width: 0.4 ms
Lead Channel Sensing Intrinsic Amplitude: 1.5 mV
Lead Channel Sensing Intrinsic Amplitude: 1.5 mV
Lead Channel Sensing Intrinsic Amplitude: 6.25 mV
Lead Channel Setting Pacing Amplitude: 2.5 V
Lead Channel Setting Sensing Sensitivity: 2 mV
MDC IDC LEAD LOCATION: 753860
MDC IDC MSMT BATTERY VOLTAGE: 3 V
MDC IDC MSMT LEADCHNL RA PACING THRESHOLD PULSEWIDTH: 0.4 ms
MDC IDC MSMT LEADCHNL RV SENSING INTR AMPL: 6.25 mV
MDC IDC PG IMPLANT DT: 20141208
MDC IDC SESS DTM: 20191008181632
MDC IDC SET LEADCHNL RA PACING AMPLITUDE: 2 V
MDC IDC SET LEADCHNL RV PACING PULSEWIDTH: 0.4 ms
MDC IDC STAT BRADY AS VP PERCENT: 0.03 %
MDC IDC STAT BRADY RA PERCENT PACED: 27.4 %

## 2017-12-11 ENCOUNTER — Other Ambulatory Visit: Payer: Self-pay | Admitting: Cardiovascular Disease

## 2017-12-15 ENCOUNTER — Other Ambulatory Visit: Payer: Self-pay | Admitting: Family Medicine

## 2018-01-23 LAB — HM DIABETES EYE EXAM

## 2018-01-24 ENCOUNTER — Encounter: Payer: Self-pay | Admitting: Family Medicine

## 2018-01-27 NOTE — Progress Notes (Signed)
Dr. Frederico Hamman T. Ashantae Pangallo, MD, [facility] [place], [address] Phone: [phone] Fax: [phone]  01/28/2018  Patient: Nina Carpenter, MRN: [id number], DOB: 11-18-1945, 73 y.o.  Primary Physician:  [name], MD   Chief Complaint  Patient presents with  . Diabetes   Subjective:   Nina Carpenter is a 73 y.o. very pleasant female patient who presents with the following:  Diabetes Mellitus: Tolerating Medications: yes Compliance with diet: fair Exercise: minimal / intermittent Avg blood sugars at home: not checking Foot problems: none Hypoglycemia: none No nausea, vomitting, blurred vision, polyuria.  Lab Results  Component Value Date   HGBA1C 7.1 (A) 01/28/2018   HGBA1C 9.7 (A) 10/22/2017   HGBA1C 8.6 (H) 04/16/2017   Lab Results  Component Value Date   MICROALBUR 1.2 04/16/2017   LDLCALC 89 04/16/2017   CREATININE 0.83 04/16/2017    Wt Readings from Last 3 Encounters:  01/28/18 203 lb 4 oz (92.2 kg)  10/22/17 209 lb 8 oz (95 kg)  07/31/17 210 lb 4 oz (95.4 kg)    Body mass index is 37.78 kg/m.   HTN: Tolerating all medications without side effects Not at goal, but taking all regular meds No CP, no sob. No HA.  BP Readings from Last 3 Encounters:  01/28/18 (!) 142/78  10/22/17 (!) 150/82  07/31/17 (!) 413/24    Basic Metabolic Panel:    Component Value Date/Time   NA 138 04/16/2017 1123   NA 139 05/09/2013 2023   K 4.6 04/16/2017 1123   K 3.9 05/09/2013 2023   CL 103 04/16/2017 1123   CL 103 05/09/2013 2023   CO2 28 04/16/2017 1123   CO2 29 05/09/2013 2023   BUN 17 04/16/2017 1123   BUN 14 05/09/2013 2023   CREATININE 0.83 04/16/2017 1123   CREATININE 0.97 05/09/2013 2023   GLUCOSE 217 (H) 04/16/2017 1123   GLUCOSE 141 (H) 05/09/2013 2023   CALCIUM 9.7 04/16/2017 1123   CALCIUM 9.5 05/09/2013 2023    Lipids: Doing well, stable. Tolerating meds fine with no SE. Panel  reviewed with patient.  Lipids:    Component Value Date/Time   CHOL 161 04/16/2017 1123   TRIG 150.0 (H) 04/16/2017 1123   HDL 41.60 04/16/2017 1123   LDLDIRECT 178.0 11/18/2012 1049   VLDL 30.0 04/16/2017 1123   CHOLHDL 4 04/16/2017 1123    Lab Results  Component Value Date   ALT 26 04/16/2017   AST 17 04/16/2017   ALKPHOS 84 04/16/2017   BILITOT 0.8 04/16/2017     Past Medical History, Surgical History, Social History, Family History, Problem List, Medications, and Allergies have been reviewed and updated if relevant.  Patient Active Problem List   Diagnosis Date Noted  . CVA (cerebral vascular accident) ([place]) 01/06/2013    Priority: High  . Atrial fibrillation ([place]) 12/29/2012    Priority: High  . Type 2 diabetes mellitus with vascular disease ([place]) 05/04/2008    Priority: High  . Cardiac pacemaker in situ 01/06/2013    Priority: Medium  . History of noncompliance with medical treatment 01/06/2013    Priority: Medium  . Tachy-brady syndrome ([place]) 12/29/2012    Priority: Medium  . Hx of rheumatic fever 08/07/2011    Priority: Medium  . NEURALGIA, TRIGEMINAL 01/26/2010    Priority: Medium  . Hyperlipidemia LDL goal <70 05/04/2008    Priority: Medium  . Essential hypertension 05/04/2008    Priority: Medium  .  ASTHMA 05/04/2008    Priority: Medium  . OSA (obstructive sleep apnea) 05/25/2014  . Chronic anticoagulation- Eliquis 01/27/2013  . ALLERGIC RHINITIS 05/04/2008  . GERD 05/04/2008  . POSTMENOPAUSAL STATUS 05/04/2008    Past Medical History:  Diagnosis Date  . Acid reflux disease   . Allergy   . Asthma    a. Remotely.  . Chronic combined systolic and diastolic CHF (congestive heart failure) ([place])    a. Felt related to AF. EF 45-50% by echo 12/2012.  Marland Kitchen CVA (cerebral vascular accident) ([place])    a. Thromboemoblic L MCA CVA 50/5697 with no residual deficit except mild occasional word finding. b. 9-48% RICA/LICA by dopplers 01/28/53.  Marland Kitchen GERD  (gastroesophageal reflux disease)   . H/O medication noncompliance    a. Per PCP note 01/06/13: Previously refused anything greater than aspirin, refused treatment for DM, and often stopped hypertensive meds and lipid meds.  Marland Kitchen Hx of rheumatic fever    a. As a child.  . Hyperlipidemia   . Hypertension   . Kidney stones    a. Remotely.  . Obesity   . PAF (paroxysmal atrial fibrillation) ([place])    a. Dx 09/2012, chronicity unclear at that time. Initially refused Xarelto due to risk of bleeding. b. Sustained CVA 12/2012 - agreeable to taking Eliquis. Tachybrady syndrome during that admission s/p Medtronic PPM implantation;  c. Failed flecainide, sotalol, amiodarone (itching), and propafenone.  . Tachy-brady syndrome ([place])    a. AF RVR with pauses >5sec requiring Medtronic PPM 12/2012.  . Tubular adenoma of colon 05/2008  . Type 2 diabetes mellitus ([place])     Past Surgical History:  Procedure Laterality Date  . CHOLECYSTECTOMY    . DESTRUCTION TRIGEMINAL NERVE VIA NEUROLYTIC AGENT  NOv @013    Gamma Knife   . INSERT / REPLACE / REMOVE PACEMAKER    . PACEMAKER INSERTION  12-30-12   MDT dual chamber pacemaker implanted by Dr Caryl Comes for tachy-brady syndrome  . PERMANENT PACEMAKER INSERTION N/A 12/30/2012   Procedure: PERMANENT PACEMAKER INSERTION;  Surgeon: Deboraha Sprang, MD;  Location: [facility] CATH LAB;  Service: Cardiovascular;  Laterality: N/A;  . TUBAL LIGATION     ectopic preganacy     Social History   Socioeconomic History  . Marital status: Married    Spouse name: Not on file  . Number of children: 2  . Years of education: Not on file  . Highest education level: Not on file  Occupational History  . Occupation: Paediatric nurse: [employer]  . Financial resource strain: Not on file  . Food insecurity:    Worry: Not on file    Inability: Not on file  . Transportation needs:    Medical: Not on file    Non-medical: Not on file  Tobacco Use  . Smoking  status: Former Smoker    Packs/day: 2.00    Types: Cigarettes    Last attempt to quit: 01/24/1968    Years since quitting: 50.0  . Smokeless tobacco: Never Used  Substance and Sexual Activity  . Alcohol use: Yes    Alcohol/week: 1.0 standard drinks    Types: 1 Glasses of wine per week    Comment: rare use  . Drug use: No  . Sexual activity: Yes  Lifestyle  . Physical activity:    Days per week: Not on file    Minutes per session: Not on file  . Stress: Not on file  Relationships  . Social  connections:    Talks on phone: Not on file    Gets together: Not on file    Attends religious service: Not on file    Active member of club or organization: Not on file    Attends meetings of clubs or organizations: Not on file    Relationship status: Not on file  . Intimate partner violence:    Fear of current or ex partner: Not on file    Emotionally abused: Not on file    Physically abused: Not on file    Forced sexual activity: Not on file  Other Topics Concern  . Not on file  Social History Narrative   Lives in [place] with her husband.  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    Family History  Problem Relation Age of Onset  . Stroke Mother   . Diabetes Mother     Allergies  Allergen Reactions  . Bee Venom Anaphylaxis  . Amiodarone Itching  . Crestor [Rosuvastatin]     myalgia  . Fish Allergy Swelling    Shell fish  . Iodine Itching    itching  . Lipitor [Atorvastatin]     myalgias  . Penicillins Hives and Itching  . Ace Inhibitors Cough  . Lidocaine Hcl Palpitations    ALL CAINES given during dental procedures    Medication list reviewed and updated in full in [place].   GEN: No acute illnesses, no fevers, chills. GI: No n/v/d, eating normally Pulm: No SOB Interactive and getting along well at home.  Otherwise, ROS is as per the HPI.  Objective:   BP (!) 142/78   Pulse 88   Temp 98.3 F (36.8 C) (Oral)   Ht 5' 1.5" (1.562 m)   Wt 203  lb 4 oz (92.2 kg)   BMI 37.78 kg/m   GEN: WDWN, NAD, Non-toxic, A & O x 3 HEENT: Atraumatic, Normocephalic. Neck supple. No masses, No LAD. Ears and Nose: No external deformity. CV: RRR, No M/G/R. No JVD. No thrill. No extra heart sounds. PULM: CTA B, no wheezes, crackles, rhonchi. No retractions. No resp. distress. No accessory muscle use. EXTR: No c/c/e NEURO Normal gait.  PSYCH: Normally interactive. Conversant. Not depressed or anxious appearing.  Calm demeanor.   Laboratory and Imaging Data:  Results for orders placed or performed in visit on 01/28/18  POCT glycosylated hemoglobin (Hb A1C)  Result Value Ref Range   Hemoglobin A1C 7.1 (A) 4.0 - 5.6 %   HbA1c POC (<> result, manual entry)     HbA1c, POC (prediabetic range)     HbA1c, POC (controlled diabetic range)       Assessment and Plan:   Type 2 diabetes mellitus with vascular disease ([place]) - Plan: POCT glycosylated hemoglobin (Hb A1C)  Paroxysmal atrial fibrillation (H[facility])  Hyperlipidemia LDL goal <70  Essential hypertension  Need for prophylactic vaccination and inoculation against influenza - Plan: Flu Vaccine QUAD 6+ mos PF IM (Fluarix Quad PF)  Cerebrovascular accident (CVA), unspecified mechanism ([place])  DM doing better  HTN not at goal, add HCTZ H/o CVA and DM  Follow-up: 4 mo  Meds ordered this encounter  Medications  . hydrochlorothiazide (HYDRODIURIL) 12.5 MG tablet    Sig: Take 1 tablet (12.5 mg total) by mouth daily.    Dispense:  90 tablet    Refill:  3   Orders Placed This Encounter  Procedures  . Flu Vaccine QUAD 6+ mos PF IM (Fluarix Quad PF)  .  POCT glycosylated hemoglobin (Hb A1C)    Signed,  Azhar Yogi T. Chirstine Defrain, MD   Outpatient Encounter Medications as of 01/28/2018  Medication Sig  . apixaban (ELIQUIS) 5 MG TABS tablet Take 1 tablet (5 mg total) by mouth 2 (two) times daily.  Marland Kitchen glipiZIDE (GLUCOTROL XL) 10 MG 24 hr tablet TAKE 2 TABLET BY MOUTH ONCE DAILY WITH BREAKFAST  .  losartan (COZAAR) 100 MG tablet TAKE 1 TABLET BY MOUTH ONCE DAILY  . metoprolol tartrate (LOPRESSOR) 100 MG tablet TAKE 1 TABLET BY MOUTH TWICE A DAY  . mometasone (NASONEX) 50 MCG/ACT nasal spray Place 2 sprays into the nose daily. (Patient taking differently: Place 2 sprays into the nose as needed. )  . montelukast (SINGULAIR) 10 MG tablet take 1 tablet by mouth at bedtime (Patient taking differently: take 1 tablet by mouth at bedtime prn)  . pantoprazole (PROTONIX) 40 MG tablet Take 40 mg by mouth daily as needed (for acid reflux).  . simvastatin (ZOCOR) 80 MG tablet TAKE 1 TABLET BY MOUTH DAILY  . sitaGLIPtin (JANUVIA) 100 MG tablet Take 1 tablet (100 mg total) by mouth daily.  . hydrochlorothiazide (HYDRODIURIL) 12.5 MG tablet Take 1 tablet (12.5 mg total) by mouth daily.   No facility-administered encounter medications on file as of 01/28/2018.

## 2018-01-28 ENCOUNTER — Encounter: Payer: Self-pay | Admitting: Family Medicine

## 2018-01-28 ENCOUNTER — Ambulatory Visit (INDEPENDENT_AMBULATORY_CARE_PROVIDER_SITE_OTHER): Payer: Medicare Other | Admitting: Family Medicine

## 2018-01-28 VITALS — BP 142/78 | HR 88 | Temp 98.3°F | Ht 61.5 in | Wt 203.2 lb

## 2018-01-28 DIAGNOSIS — E785 Hyperlipidemia, unspecified: Secondary | ICD-10-CM

## 2018-01-28 DIAGNOSIS — I639 Cerebral infarction, unspecified: Secondary | ICD-10-CM | POA: Diagnosis not present

## 2018-01-28 DIAGNOSIS — I480 Paroxysmal atrial fibrillation: Secondary | ICD-10-CM | POA: Diagnosis not present

## 2018-01-28 DIAGNOSIS — E1159 Type 2 diabetes mellitus with other circulatory complications: Secondary | ICD-10-CM

## 2018-01-28 DIAGNOSIS — I10 Essential (primary) hypertension: Secondary | ICD-10-CM

## 2018-01-28 DIAGNOSIS — Z23 Encounter for immunization: Secondary | ICD-10-CM

## 2018-01-28 LAB — POCT GLYCOSYLATED HEMOGLOBIN (HGB A1C): Hemoglobin A1C: 7.1 % — AB (ref 4.0–5.6)

## 2018-01-28 MED ORDER — HYDROCHLOROTHIAZIDE 12.5 MG PO TABS: 12.5000 mg | ORAL_TABLET | Freq: Every day | ORAL | 3 refills | Status: DC

## 2018-01-29 ENCOUNTER — Ambulatory Visit (INDEPENDENT_AMBULATORY_CARE_PROVIDER_SITE_OTHER): Payer: Medicare Other

## 2018-01-29 DIAGNOSIS — I495 Sick sinus syndrome: Secondary | ICD-10-CM

## 2018-01-30 NOTE — Progress Notes (Signed)
Remote pacemaker transmission.   

## 2018-01-31 LAB — CUP PACEART REMOTE DEVICE CHECK
Brady Statistic AP VP Percent: 0.11 %
Brady Statistic AP VS Percent: 48.4 %
Brady Statistic AS VP Percent: 0.02 %
Implantable Lead Implant Date: 20141208
Implantable Lead Implant Date: 20141208
Implantable Lead Location: 753859
Implantable Lead Model: 5076
Lead Channel Impedance Value: 342 Ohm
Lead Channel Impedance Value: 380 Ohm
Lead Channel Impedance Value: 399 Ohm
Lead Channel Pacing Threshold Amplitude: 0.5 V
Lead Channel Pacing Threshold Pulse Width: 0.4 ms
Lead Channel Pacing Threshold Pulse Width: 0.4 ms
Lead Channel Sensing Intrinsic Amplitude: 2.75 mV
Lead Channel Sensing Intrinsic Amplitude: 7.125 mV
Lead Channel Sensing Intrinsic Amplitude: 7.125 mV
Lead Channel Setting Pacing Amplitude: 2 V
Lead Channel Setting Pacing Amplitude: 2.5 V
Lead Channel Setting Pacing Pulse Width: 0.4 ms
MDC IDC LEAD LOCATION: 753860
MDC IDC MSMT BATTERY REMAINING LONGEVITY: 67 mo
MDC IDC MSMT BATTERY VOLTAGE: 2.99 V
MDC IDC MSMT LEADCHNL RA PACING THRESHOLD AMPLITUDE: 0.625 V
MDC IDC MSMT LEADCHNL RA SENSING INTR AMPL: 2.75 mV
MDC IDC MSMT LEADCHNL RV IMPEDANCE VALUE: 437 Ohm
MDC IDC PG IMPLANT DT: 20141208
MDC IDC SESS DTM: 20200107223713
MDC IDC SET LEADCHNL RV SENSING SENSITIVITY: 2 mV
MDC IDC STAT BRADY AS VS PERCENT: 51.47 %
MDC IDC STAT BRADY RA PERCENT PACED: 48.45 %
MDC IDC STAT BRADY RV PERCENT PACED: 0.13 %

## 2018-02-14 ENCOUNTER — Other Ambulatory Visit: Payer: Self-pay | Admitting: Internal Medicine

## 2018-03-24 ENCOUNTER — Other Ambulatory Visit: Payer: Self-pay | Admitting: Family Medicine

## 2018-03-27 DIAGNOSIS — J029 Acute pharyngitis, unspecified: Secondary | ICD-10-CM | POA: Diagnosis not present

## 2018-03-27 DIAGNOSIS — J019 Acute sinusitis, unspecified: Secondary | ICD-10-CM | POA: Diagnosis not present

## 2018-03-27 DIAGNOSIS — J101 Influenza due to other identified influenza virus with other respiratory manifestations: Secondary | ICD-10-CM | POA: Diagnosis not present

## 2018-03-27 DIAGNOSIS — J209 Acute bronchitis, unspecified: Secondary | ICD-10-CM | POA: Diagnosis not present

## 2018-03-27 DIAGNOSIS — R6889 Other general symptoms and signs: Secondary | ICD-10-CM | POA: Diagnosis not present

## 2018-03-27 DIAGNOSIS — B9689 Other specified bacterial agents as the cause of diseases classified elsewhere: Secondary | ICD-10-CM | POA: Diagnosis not present

## 2018-03-28 ENCOUNTER — Telehealth: Payer: Self-pay

## 2018-03-28 ENCOUNTER — Ambulatory Visit: Payer: Medicare Other | Admitting: Family Medicine

## 2018-03-28 NOTE — Telephone Encounter (Signed)
Yes, thanks

## 2018-03-28 NOTE — Telephone Encounter (Signed)
Appointment cancelled

## 2018-03-28 NOTE — Telephone Encounter (Signed)
[place] Night - Client Nonclinical Telephone Record [facility] Night - Client Client Site [place] - Night Contact Type Call Who Is Calling Patient / Member / Family / Caregiver Caller Name Mr. Riona Lahti Phone Number [phone] Patient Name Sruti Ayllon Patient DOB October 01, 1945 Call Type Message Only Information Provided Reason for Call Request to [facility] Appointment Initial Comment Caller states his wife had an appt scheduled for tomorrow at [facility] that she is needing to cancel. Additional Comment Call Closed By: Delton Coombes Transaction Date/Time: 03/27/2018 6:37:38 PM (ET)

## 2018-03-28 NOTE — Telephone Encounter (Signed)
Dr.Tower, According to note patient called yesterday after hours to cancel her appointment for today at 9:00. Can I cancel the appointment?

## 2018-04-09 ENCOUNTER — Other Ambulatory Visit: Payer: Self-pay | Admitting: Family Medicine

## 2018-04-29 ENCOUNTER — Ambulatory Visit: Payer: Medicare Other

## 2018-04-30 ENCOUNTER — Other Ambulatory Visit: Payer: Self-pay

## 2018-04-30 ENCOUNTER — Ambulatory Visit (INDEPENDENT_AMBULATORY_CARE_PROVIDER_SITE_OTHER): Payer: Medicare Other | Admitting: *Deleted

## 2018-04-30 DIAGNOSIS — I495 Sick sinus syndrome: Secondary | ICD-10-CM

## 2018-05-01 ENCOUNTER — Telehealth: Payer: Self-pay

## 2018-05-01 NOTE — Telephone Encounter (Signed)
Spoke with patient to remind of missed remote transmission 

## 2018-05-02 LAB — CUP PACEART REMOTE DEVICE CHECK
Battery Remaining Longevity: 61 mo
Battery Voltage: 2.99 V
Brady Statistic AP VP Percent: 0.08 %
Brady Statistic AP VS Percent: 45.63 %
Brady Statistic AS VP Percent: 0.03 %
Brady Statistic AS VS Percent: 54.26 %
Brady Statistic RA Percent Paced: 45.64 %
Brady Statistic RV Percent Paced: 0.11 %
Date Time Interrogation Session: 20200408215727
Implantable Lead Implant Date: 20141208
Implantable Lead Implant Date: 20141208
Implantable Lead Location: 753859
Implantable Lead Location: 753860
Implantable Lead Model: 5076
Implantable Lead Model: 5076
Implantable Pulse Generator Implant Date: 20141208
Lead Channel Impedance Value: 361 Ohm
Lead Channel Impedance Value: 380 Ohm
Lead Channel Impedance Value: 399 Ohm
Lead Channel Impedance Value: 437 Ohm
Lead Channel Pacing Threshold Amplitude: 0.625 V
Lead Channel Pacing Threshold Amplitude: 0.625 V
Lead Channel Pacing Threshold Pulse Width: 0.4 ms
Lead Channel Pacing Threshold Pulse Width: 0.4 ms
Lead Channel Sensing Intrinsic Amplitude: 2.125 mV
Lead Channel Sensing Intrinsic Amplitude: 2.125 mV
Lead Channel Sensing Intrinsic Amplitude: 6.875 mV
Lead Channel Sensing Intrinsic Amplitude: 6.875 mV
Lead Channel Setting Pacing Amplitude: 2 V
Lead Channel Setting Pacing Amplitude: 2.5 V
Lead Channel Setting Pacing Pulse Width: 0.4 ms
Lead Channel Setting Sensing Sensitivity: 2 mV

## 2018-05-06 ENCOUNTER — Encounter: Payer: Medicare Other | Admitting: Family Medicine

## 2018-05-08 NOTE — Progress Notes (Signed)
Remote pacemaker transmission.   

## 2018-05-16 ENCOUNTER — Other Ambulatory Visit: Payer: Self-pay | Admitting: Family Medicine

## 2018-06-08 ENCOUNTER — Other Ambulatory Visit: Payer: Self-pay | Admitting: Family Medicine

## 2018-07-12 ENCOUNTER — Other Ambulatory Visit: Payer: Self-pay | Admitting: Family Medicine

## 2018-07-12 DIAGNOSIS — E1159 Type 2 diabetes mellitus with other circulatory complications: Secondary | ICD-10-CM

## 2018-07-12 DIAGNOSIS — E785 Hyperlipidemia, unspecified: Secondary | ICD-10-CM

## 2018-07-12 DIAGNOSIS — Z79899 Other long term (current) drug therapy: Secondary | ICD-10-CM

## 2018-07-15 ENCOUNTER — Ambulatory Visit (INDEPENDENT_AMBULATORY_CARE_PROVIDER_SITE_OTHER): Payer: Medicare Other

## 2018-07-15 ENCOUNTER — Other Ambulatory Visit: Payer: Medicare Other

## 2018-07-15 ENCOUNTER — Telehealth: Payer: Self-pay | Admitting: Family Medicine

## 2018-07-15 DIAGNOSIS — Z0000 Encounter for general adult medical examination without abnormal findings: Secondary | ICD-10-CM | POA: Diagnosis not present

## 2018-07-15 NOTE — Patient Instructions (Signed)
Nina Carpenter , Thank you for taking time to come for your Medicare Wellness Visit. I appreciate your ongoing commitment to your health goals. Please review the following plan we discussed and let me know if I can assist you in the future.   These are the goals we discussed: Goals    . DIET - INCREASE WATER INTAKE     Starting 07/15/18, I will continue to drink at least 6-8 glasses of water daily.        This is a list of the screening recommended for you and due dates:  Health Maintenance  Topic Date Due  . Complete foot exam   01/23/2020*  . Mammogram  01/23/2020*  . DEXA scan (bone density measurement)  01/23/2020*  . Colon Cancer Screening  01/23/2020*  . Tetanus Vaccine  01/23/2020*  . Hemoglobin A1C  07/29/2018  . Flu Shot  08/24/2018  . Eye exam for diabetics  01/24/2019  .  Hepatitis C: One time screening is recommended by Center for Disease Control  (CDC) for  adults born from [birth year] through 1965.   Completed  . Pneumonia vaccines  Completed  *Topic was postponed. The date shown is not the original due date.   Preventive Care for Adults  A healthy lifestyle and preventive care can promote health and wellness. Preventive health guidelines for adults include the following key practices.  . A routine yearly physical is a good way to check with your health care provider about your health and preventive screening. It is a chance to share any concerns and updates on your health and to receive a thorough exam.  . Visit your dentist for a routine exam and preventive care every 6 months. Brush your teeth twice a day and floss once a day. Good oral hygiene prevents tooth decay and gum disease.  . The frequency of eye exams is based on your age, health, family medical history, use  of contact lenses, and other factors. Follow your health care provider's recommendations for frequency of eye exams.  . Eat a healthy diet. Foods like vegetables, fruits, whole grains, low-fat dairy products,  and lean protein foods contain the nutrients you need without too many calories. Decrease your intake of foods high in solid fats, added sugars, and salt. Eat the right amount of calories for you. Get information about a proper diet from your health care provider, if necessary.  . Regular physical exercise is one of the most important things you can do for your health. Most adults should get at least 150 minutes of moderate-intensity exercise (any activity that increases your heart rate and causes you to sweat) each week. In addition, most adults need muscle-strengthening exercises on 2 or more days a week.  Silver Sneakers may be a benefit available to you. To determine eligibility, you may visit the website: www.silversneakers.com or contact program at (573)772-7820 Mon-Fri between 8AM-8PM.   . Maintain a healthy weight. The body mass index (BMI) is a screening tool to identify possible weight problems. It provides an estimate of body fat based on height and weight. Your health care provider can find your BMI and can help you achieve or maintain a healthy weight.   For adults 20 years and older: ? A BMI below 18.5 is considered underweight. ? A BMI of 18.5 to 24.9 is normal. ? A BMI of 25 to 29.9 is considered overweight. ? A BMI of 30 and above is considered obese.   . Maintain normal blood lipids and cholesterol  levels by exercising and minimizing your intake of saturated fat. Eat a balanced diet with plenty of fruit and vegetables. Blood tests for lipids and cholesterol should begin at age 53 and be repeated every 5 years. If your lipid or cholesterol levels are high, you are over 50, or you are at high risk for heart disease, you may need your cholesterol levels checked more frequently. Ongoing high lipid and cholesterol levels should be treated with medicines if diet and exercise are not working.  . If you smoke, find out from your health care provider how to quit. If you do not use tobacco,  please do not start.  . If you choose to drink alcohol, please do not consume more than 2 drinks per day. One drink is considered to be 12 ounces (355 mL) of beer, 5 ounces (148 mL) of wine, or 1.5 ounces (44 mL) of liquor.  . If you are 52-44 years old, ask your health care provider if you should take aspirin to prevent strokes.  . Use sunscreen. Apply sunscreen liberally and repeatedly throughout the day. You should seek shade when your shadow is shorter than you. Protect yourself by wearing long sleeves, pants, a wide-brimmed hat, and sunglasses year round, whenever you are outdoors.  . Once a month, do a whole body skin exam, using a mirror to look at the skin on your back. Tell your health care provider of new moles, moles that have irregular borders, moles that are larger than a pencil eraser, or moles that have changed in shape or color.

## 2018-07-15 NOTE — Progress Notes (Signed)
I reviewed health advisor's note, was available for consultation, and agree with documentation and plan.   Signed,  Reace Breshears T. Trygg Mantz, MD  

## 2018-07-15 NOTE — Telephone Encounter (Signed)
Best number [phone] Pt called wanting to know what she needs to do  Her spouse has been exposed to covid through his coworker that test positive Thursday or Friday. Pt not showing any symptoms.  Pt has lab appointment today  Can she come to that appointment? Spouse is going to be tested for covid

## 2018-07-15 NOTE — Progress Notes (Signed)
PCP notes:   Health maintenance:  Foot exam - to be determined Mammogram - to be determined Bone density - to be determined Colonoscopy - to be determined Tetanus vaccine - postponed/insurance  Abnormal screenings:   None  Patient concerns:   Patient stated her husband may have been exposed to [place] at work. Spouse has testing pending for 07/16/18. Patient's lab appt was rescheduled until spouse's testing  results are received.    Nurse concerns:  None  Next PCP appt:   07/31/18 @ 1020

## 2018-07-15 NOTE — Progress Notes (Signed)
Subjective:   Nina Carpenter is a 73 y.o. female who presents for Medicare Annual (Subsequent) preventive examination.  Review of Systems:  N/A Cardiac Risk Factors include: advanced age (>87men, >20 women);diabetes mellitus;dyslipidemia;obesity (BMI >30kg/m2);hypertension     Objective:     Vitals: There were no vitals taken for this visit.  There is no height or weight on file to calculate BMI.  Advanced Directives 07/15/2018 04/16/2017 02/22/2013 01/28/2013 01/11/2013 01/03/2013 12/28/2012  Does Patient Have a Medical Advance Directive? No No Patient does not have advance directive;Patient would not like information Patient does not have advance directive Patient does not have advance directive Patient does not have advance directive Patient does not have advance directive  Would patient like information on creating a medical advance directive? No - Patient declined Yes (MAU/Ambulatory/Procedural Areas - Information given) - - - - -  Pre-existing out of facility DNR order (yellow form or pink MOST form) - - No No No No No    Tobacco Social History   Tobacco Use  Smoking Status Former Smoker  . Packs/day: 2.00  . Types: Cigarettes  . Quit date: 01/24/1968  . Years since quitting: 50.5  Smokeless Tobacco Never Used     Counseling given: No   Clinical Intake:  Pre-visit preparation completed: Yes  Pain : No/denies pain Pain Score: 0-No pain     Nutritional Status: BMI > 30  Obese Nutritional Risks: None Diabetes: Yes CBG done?: No Did pt. bring in CBG monitor from home?: No  How often do you need to have someone help you when you read instructions, pamphlets, or other written materials from your doctor or pharmacy?: 1 - Never What is the last grade level you completed in school?: Cosmetology school  Interpreter Needed?: No  Comments: pt lives with spouse Information entered by :: LPinson, RN  Past Medical History:  Diagnosis Date  . Acid reflux disease   .  Allergy   . Asthma    a. Remotely.  . Chronic combined systolic and diastolic CHF (congestive heart failure) ([place])    a. Felt related to AF. EF 45-50% by echo 12/2012.  Marland Kitchen CVA (cerebral vascular accident) ([place])    a. Thromboemoblic L MCA CVA 53/6468 with no residual deficit except mild occasional word finding. b. 0-32% RICA/LICA by dopplers 12/25/46.  Marland Kitchen GERD (gastroesophageal reflux disease)   . H/O medication noncompliance    a. Per PCP note 01/06/13: Previously refused anything greater than aspirin, refused treatment for DM, and often stopped hypertensive meds and lipid meds.  Marland Kitchen Hx of rheumatic fever    a. As a child.  . Hyperlipidemia   . Hypertension   . Kidney stones    a. Remotely.  . Obesity   . PAF (paroxysmal atrial fibrillation) ([place])    a. Dx 09/2012, chronicity unclear at that time. Initially refused Xarelto due to risk of bleeding. b. Sustained CVA 12/2012 - agreeable to taking Eliquis. Tachybrady syndrome during that admission s/p Medtronic PPM implantation;  c. Failed flecainide, sotalol, amiodarone (itching), and propafenone.  . Tachy-brady syndrome ([place])    a. AF RVR with pauses >5sec requiring Medtronic PPM 12/2012.  . Tubular adenoma of colon 05/2008  . Type 2 diabetes mellitus ([place])    Past Surgical History:  Procedure Laterality Date  . CHOLECYSTECTOMY    . DESTRUCTION TRIGEMINAL NERVE VIA NEUROLYTIC AGENT  NOv @013    Gamma Knife   . INSERT / REPLACE / REMOVE PACEMAKER    . PACEMAKER INSERTION  12-30-12   MDT dual chamber pacemaker implanted by Dr Caryl Comes for tachy-brady syndrome  . PERMANENT PACEMAKER INSERTION N/A 12/30/2012   Procedure: PERMANENT PACEMAKER INSERTION;  Surgeon: Deboraha Sprang, MD;  Location: [facility] CATH LAB;  Service: Cardiovascular;  Laterality: N/A;  . TUBAL LIGATION     ectopic preganacy    Family History  Problem Relation Age of Onset  . Stroke Mother   . Diabetes Mother    Social History   Socioeconomic History  . Marital status: Married     Spouse name: Not on file  . Number of children: 2  . Years of education: Not on file  . Highest education level: Not on file  Occupational History  . Occupation: Paediatric nurse: [employer]  . Financial resource strain: Not on file  . Food insecurity    Worry: Not on file    Inability: Not on file  . Transportation needs    Medical: Not on file    Non-medical: Not on file  Tobacco Use  . Smoking status: Former Smoker    Packs/day: 2.00    Types: Cigarettes    Quit date: 01/24/1968    Years since quitting: 50.5  . Smokeless tobacco: Never Used  Substance and Sexual Activity  . Alcohol use: Yes    Alcohol/week: 1.0 standard drinks    Types: 1 Glasses of wine per week    Comment: rare use  . Drug use: No  . Sexual activity: Yes  Lifestyle  . Physical activity    Days per week: Not on file    Minutes per session: Not on file  . Stress: Not on file  Relationships  . Social Herbalist on phone: Not on file    Gets together: Not on file    Attends religious service: Not on file    Active member of club or organization: Not on file    Attends meetings of clubs or organizations: Not on file    Relationship status: Not on file  Other Topics Concern  . Not on file  Social History Narrative   Lives in [place] with her husband.  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    Outpatient Encounter Medications as of 07/15/2018  Medication Sig  . ELIQUIS 5 MG TABS tablet TAKE 1 TABLET BY MOUTH TWICE DAILY  . glipiZIDE (GLUCOTROL XL) 10 MG 24 hr tablet TAKE 2 TABLETS BY MOUTH EVERY DAY WITH BREAKFAST  . hydrochlorothiazide (HYDRODIURIL) 12.5 MG tablet Take 1 tablet (12.5 mg total) by mouth daily.  Marland Kitchen JANUVIA 100 MG tablet TAKE 1 TABLET(100 MG) BY MOUTH DAILY  . losartan (COZAAR) 100 MG tablet TAKE 1 TABLET BY MOUTH ONCE DAILY  . metoprolol tartrate (LOPRESSOR) 100 MG tablet TAKE 1 TABLET BY MOUTH TWICE A DAY  . mometasone (NASONEX) 50  MCG/ACT nasal spray Place 2 sprays into the nose daily. (Patient taking differently: Place 2 sprays into the nose as needed. )  . montelukast (SINGULAIR) 10 MG tablet TAKE 1 TABLET BY MOUTH AT BEDTIME  . pantoprazole (PROTONIX) 40 MG tablet Take 40 mg by mouth daily as needed (for acid reflux).  . simvastatin (ZOCOR) 80 MG tablet TAKE 1 TABLET BY MOUTH DAILY   No facility-administered encounter medications on file as of 07/15/2018.     Activities of Daily Living In your present state of health, do you have any difficulty performing the following activities: 07/15/2018  Hearing? N  Vision? N  Difficulty concentrating or making decisions? N  Walking or climbing stairs? N  Dressing or bathing? N  Doing errands, shopping? N  Preparing Food and eating ? N  Using the Toilet? N  In the past six months, have you accidently leaked urine? N  Do you have problems with loss of bowel control? N  Managing your Medications? N  Managing your Finances? N  Housekeeping or managing your Housekeeping? N  Some recent data might be hidden    Patient Care Team: [name], MD as PCP - General    Assessment:   This is a routine wellness examination for [place].   Hearing Screening   125Hz  250Hz  500Hz  1000Hz  2000Hz  3000Hz  4000Hz  6000Hz  8000Hz   Right ear:           Left ear:           Vision Screening Comments: Vision exam in Nov 2019 @ [facility]   Exercise Activities and Dietary recommendations Current Exercise Habits: The patient does not participate in regular exercise at present, Exercise limited by: None identified  Goals    . DIET - INCREASE WATER INTAKE     Starting 07/15/18, I will continue to drink at least 6-8 glasses of water daily.        Fall Risk Fall Risk  07/15/2018 04/16/2017 03/20/2016 03/17/2015  Falls in the past year? 0 No No No   Depression Screen PHQ 2/9 Scores 07/15/2018 04/16/2017 03/20/2016 03/17/2015  PHQ - 2 Score 0 0 0 0  PHQ- 9 Score 0 0 - -      Cognitive Function MMSE - Mini Mental State Exam 07/15/2018 04/16/2017  Orientation to time 5 5  Orientation to Place 5 5  Registration 3 3  Attention/ Calculation 0 0  Recall 3 3  Language- name 2 objects 0 0  Language- repeat 1 1  Language- follow 3 step command 0 3  Language- read & follow direction 0 0  Write a sentence 0 0  Copy design 0 0  Total score 17 20     PLEASE NOTE: A Mini-Cog screen was completed. Maximum score is 17. A value of 0 denotes this part of [place] MMSE was not completed or the patient failed this part of the Mini-Cog screening.   Mini-Cog Screening Orientation to Time - Max 5 pts Orientation to Place - Max 5 pts Registration - Max 3 pts Recall - Max 3 pts Language Repeat - Max 1 pts   Immunization History  Administered Date(s) Administered  . Influenza Split 11/13/2011  . Influenza, High Dose Seasonal PF 11/23/2016  . Influenza,inj,Quad PF,6+ Mos 11/18/2012, 10/06/2013, 12/23/2014, 03/20/2016, 01/28/2018  . Pneumococcal Conjugate-13 10/06/2013  . Pneumococcal Polysaccharide-23 10/11/2010    Screening Tests Health Maintenance  Topic Date Due  . FOOT EXAM  01/23/2020 (Originally 04/24/2018)  . MAMMOGRAM  01/23/2020 (Originally 05/30/2017)  . DEXA SCAN  01/23/2020 (Originally 07/28/2010)  . COLONOSCOPY  01/23/2020 (Originally 06/16/2018)  . TETANUS/TDAP  01/23/2020 (Originally 07/27/1964)  . HEMOGLOBIN A1C  07/29/2018  . INFLUENZA VACCINE  08/24/2018  . OPHTHALMOLOGY EXAM  01/24/2019  . Hepatitis C Screening  Completed  . PNA vac Low Risk Adult  Completed       Plan:     I have personally reviewed, addressed, and noted the following in the patient's chart:  A. Medical and social history B. Use of alcohol, tobacco or illicit drugs  C. Current medications and supplements D. Functional ability and status E.  Nutritional  status F.  Physical activity G. Advance directives H. List of other physicians I.  Hospitalizations, surgeries, and ER visits  in previous 12 months J.  Vitals (unless it is a telemedicine encounter) K. Screenings to include cognitive, depression, hearing, vision (NOTE: hearing and vision screenings not completed in telemedicine encounter) L. Referrals and appointments   In addition, I have reviewed and discussed with patient certain preventive protocols, quality metrics, and best practice recommendations. A written personalized care plan for preventive services and recommendations were provided to patient.  With patient's permission, we connected on 07/15/18 at 11:00 AM EDT. Interactive audio and video telecommunications were attempted with patient. This attempt was unsuccessful due to patient having technical difficulties OR patient did not have access to video capability.  Encounter was completed with audio only.  Two patient identifiers were used to ensure the encounter occurred with the correct person. Patient was in home and writer was in office.     Signed,   Lindell Noe, MHA, BS, RN Health Coach

## 2018-07-16 NOTE — Telephone Encounter (Signed)
Agree with Pinson's plan of care

## 2018-07-17 ENCOUNTER — Ambulatory Visit: Payer: Medicare Other

## 2018-07-29 ENCOUNTER — Other Ambulatory Visit (INDEPENDENT_AMBULATORY_CARE_PROVIDER_SITE_OTHER): Payer: Medicare Other

## 2018-07-29 DIAGNOSIS — Z79899 Other long term (current) drug therapy: Secondary | ICD-10-CM | POA: Diagnosis not present

## 2018-07-29 DIAGNOSIS — E785 Hyperlipidemia, unspecified: Secondary | ICD-10-CM

## 2018-07-29 DIAGNOSIS — E1159 Type 2 diabetes mellitus with other circulatory complications: Secondary | ICD-10-CM | POA: Diagnosis not present

## 2018-07-29 LAB — CBC WITH DIFFERENTIAL/PLATELET
Basophils Absolute: 0.1 10*3/uL (ref 0.0–0.1)
Basophils Relative: 0.7 % (ref 0.0–3.0)
Eosinophils Absolute: 0.1 10*3/uL (ref 0.0–0.7)
Eosinophils Relative: 2.1 % (ref 0.0–5.0)
HCT: 43.2 % (ref 36.0–46.0)
Hemoglobin: 14.3 g/dL (ref 12.0–15.0)
Lymphocytes Relative: 31.1 % (ref 12.0–46.0)
Lymphs Abs: 2.2 10*3/uL (ref 0.7–4.0)
MCHC: 33.2 g/dL (ref 30.0–36.0)
MCV: 92.1 fl (ref 78.0–100.0)
Monocytes Absolute: 0.6 10*3/uL (ref 0.1–1.0)
Monocytes Relative: 8.2 % (ref 3.0–12.0)
Neutro Abs: 4.2 10*3/uL (ref 1.4–7.7)
Neutrophils Relative %: 57.9 % (ref 43.0–77.0)
Platelets: 227 10*3/uL (ref 150.0–400.0)
RBC: 4.69 Mil/uL (ref 3.87–5.11)
RDW: 13 % (ref 11.5–15.5)
WBC: 7.2 10*3/uL (ref 4.0–10.5)

## 2018-07-29 LAB — BASIC METABOLIC PANEL
BUN: 17 mg/dL (ref 6–23)
CO2: 27 mEq/L (ref 19–32)
Calcium: 9.5 mg/dL (ref 8.4–10.5)
Chloride: 104 mEq/L (ref 96–112)
Creatinine, Ser: 0.99 mg/dL (ref 0.40–1.20)
GFR: 54.98 mL/min — ABNORMAL LOW (ref >60.00)
Glucose, Bld: 187 mg/dL — ABNORMAL HIGH (ref 70–99)
Potassium: 4.7 mEq/L (ref 3.5–5.1)
Sodium: 139 mEq/L (ref 135–145)

## 2018-07-29 LAB — HEPATIC FUNCTION PANEL
ALT: 25 U/L (ref 0–35)
AST: 15 U/L (ref 0–37)
Albumin: 4.3 g/dL (ref 3.5–5.2)
Alkaline Phosphatase: 78 U/L (ref 39–117)
Bilirubin, Direct: 0.1 mg/dL (ref 0.0–0.3)
Total Bilirubin: 0.6 mg/dL (ref 0.2–1.2)
Total Protein: 6.5 g/dL (ref 6.0–8.3)

## 2018-07-29 LAB — LIPID PANEL
Cholesterol: 168 mg/dL (ref 0–200)
HDL: 40.3 mg/dL (ref >39.00)
NonHDL: 128.16
Total CHOL/HDL Ratio: 4
Triglycerides: 205 mg/dL — ABNORMAL HIGH (ref 0.0–149.0)
VLDL: 41 mg/dL — ABNORMAL HIGH (ref 0.0–40.0)

## 2018-07-29 LAB — MICROALBUMIN / CREATININE URINE RATIO
Creatinine,U: 76.8 mg/dL
Microalb Creat Ratio: 1.6 mg/g (ref 0.0–30.0)
Microalb, Ur: 1.3 mg/dL (ref 0.0–1.9)

## 2018-07-29 LAB — TSH: TSH: 2.27 u[IU]/mL (ref 0.35–4.50)

## 2018-07-29 LAB — HEMOGLOBIN A1C: Hgb A1c MFr Bld: 8 % — ABNORMAL HIGH (ref 4.6–6.5)

## 2018-07-29 LAB — LDL CHOLESTEROL, DIRECT: Direct LDL: 103 mg/dL

## 2018-07-30 ENCOUNTER — Ambulatory Visit (INDEPENDENT_AMBULATORY_CARE_PROVIDER_SITE_OTHER): Payer: Medicare Other | Admitting: *Deleted

## 2018-07-30 DIAGNOSIS — I495 Sick sinus syndrome: Secondary | ICD-10-CM

## 2018-07-30 LAB — CUP PACEART REMOTE DEVICE CHECK
Battery Remaining Longevity: 63 mo
Battery Voltage: 2.99 V
Brady Statistic AP VP Percent: 0.04 %
Brady Statistic AP VS Percent: 33.02 %
Brady Statistic AS VP Percent: 0.03 %
Brady Statistic AS VS Percent: 66.91 %
Brady Statistic RA Percent Paced: 33.02 %
Brady Statistic RV Percent Paced: 0.07 %
Date Time Interrogation Session: 20200707115349
Implantable Lead Implant Date: 20141208
Implantable Lead Implant Date: 20141208
Implantable Lead Location: 753859
Implantable Lead Location: 753860
Implantable Lead Model: 5076
Implantable Lead Model: 5076
Implantable Pulse Generator Implant Date: 20141208
Lead Channel Impedance Value: 342 Ohm
Lead Channel Impedance Value: 380 Ohm
Lead Channel Impedance Value: 380 Ohm
Lead Channel Impedance Value: 418 Ohm
Lead Channel Pacing Threshold Amplitude: 0.5 V
Lead Channel Pacing Threshold Amplitude: 0.625 V
Lead Channel Pacing Threshold Pulse Width: 0.4 ms
Lead Channel Pacing Threshold Pulse Width: 0.4 ms
Lead Channel Sensing Intrinsic Amplitude: 1.375 mV
Lead Channel Sensing Intrinsic Amplitude: 1.375 mV
Lead Channel Sensing Intrinsic Amplitude: 6.75 mV
Lead Channel Sensing Intrinsic Amplitude: 6.75 mV
Lead Channel Setting Pacing Amplitude: 2 V
Lead Channel Setting Pacing Amplitude: 2.5 V
Lead Channel Setting Pacing Pulse Width: 0.4 ms
Lead Channel Setting Sensing Sensitivity: 2 mV

## 2018-07-31 ENCOUNTER — Encounter: Payer: Medicare Other | Admitting: Family Medicine

## 2018-08-11 ENCOUNTER — Encounter: Payer: Self-pay | Admitting: Cardiology

## 2018-08-11 NOTE — Progress Notes (Signed)
Remote pacemaker transmission.   

## 2018-09-02 ENCOUNTER — Encounter: Payer: Medicare Other | Admitting: Family Medicine

## 2018-09-05 ENCOUNTER — Other Ambulatory Visit: Payer: Self-pay | Admitting: Family Medicine

## 2018-09-11 ENCOUNTER — Other Ambulatory Visit: Payer: Self-pay | Admitting: Internal Medicine

## 2018-09-11 ENCOUNTER — Other Ambulatory Visit: Payer: Self-pay | Admitting: Family Medicine

## 2018-09-11 NOTE — Telephone Encounter (Addendum)
Eliquis 5mg  refill request received; pt is 73 yrs old, wt-92.2kg, Crea-0.99 on 07/29/2018, last seen by Dr. Caryl Comes on 07/31/2017 and recall was sent per documentation-so pt needs an appt.  Called the pt and spoke with her and instructed her that she needs an appt with the Cardiologist and that I could only send in 30 day supply and she verbalized understanding. She stated she would call the main number for [place] back to schedule. Advised that I would send in the 30 day supply and she needs to call back soon as she can to schedule an appt.

## 2018-09-22 NOTE — Progress Notes (Signed)
Shandra Szymborski T. Reynoldo Mainer, MD Primary Care and [place] at [facility] [place], [address] Phone: [phone]  FAX: [phone]  Nina Carpenter - 73 y.o. female  MRN [id number]  Date of Birth: January 19, 1946  Visit Date: 09/23/2018  PCP: [name], MD  Referred by: [name], MD  CC: f/u multiple med problems  Subjective:   Nina Carpenter is a 73 y.o. very pleasant female patient who presents with the following:   Medicare wellness: done by Lattie Haw  Diabetes Mellitus: Tolerating Medications: yes Compliance with diet: fair, Body mass index is 37.63 kg/m. Exercise: minimal / intermittent Avg blood sugars at home: not checking Foot problems: none Hypoglycemia: none No nausea, vomitting, blurred vision, polyuria.  Lab Results  Component Value Date   HGBA1C 8.0 (H) 07/29/2018   HGBA1C 7.1 (A) 01/28/2018   HGBA1C 9.7 (A) 10/22/2017   Lab Results  Component Value Date   MICROALBUR 1.3 07/29/2018   LDLCALC 89 04/16/2017   CREATININE 0.99 07/29/2018    Wt Readings from Last 3 Encounters:  09/23/18 205 lb 12 oz (93.3 kg)  01/28/18 203 lb 4 oz (92.2 kg)  10/22/17 209 lb 8 oz (95 kg)    Lipids: Doing well, stable. Tolerating meds fine with no SE. Panel reviewed with patient.  Lipids:    Component Value Date/Time   CHOL 168 07/29/2018 0947   TRIG 205.0 (H) 07/29/2018 0947   HDL 40.30 07/29/2018 0947   LDLDIRECT 103.0 07/29/2018 0947   VLDL 41.0 (H) 07/29/2018 0947   CHOLHDL 4 07/29/2018 0947    Lab Results  Component Value Date   ALT 25 07/29/2018   AST 15 07/29/2018   ALKPHOS 78 07/29/2018   BILITOT 0.6 07/29/2018    She also has a history of having a stroke. Historically she did not do a great job taking her medicine.  She is currently on Zocor, metoprolol, Cozaar, hydrochlorothiazide as well as Eliquis.  She is on Eliquis for atrial fibrillation.    Past Medical History, Surgical  History, Social History, Family History, Problem List, Medications, and Allergies have been reviewed and updated if relevant.  Patient Active Problem List   Diagnosis Date Noted  . CVA (cerebral vascular accident) ([place]) 01/06/2013    Priority: High  . Atrial fibrillation ([place]) 12/29/2012    Priority: High  . Type 2 diabetes mellitus with vascular disease ([place]) 05/04/2008    Priority: High  . Cardiac pacemaker in situ 01/06/2013    Priority: Medium  . History of noncompliance with medical treatment 01/06/2013    Priority: Medium  . Tachy-brady syndrome ([place]) 12/29/2012    Priority: Medium  . Hx of rheumatic fever 08/07/2011    Priority: Medium  . NEURALGIA, TRIGEMINAL 01/26/2010    Priority: Medium  . Hyperlipidemia LDL goal <70 05/04/2008    Priority: Medium  . Essential hypertension 05/04/2008    Priority: Medium  . ASTHMA 05/04/2008    Priority: Medium  . OSA (obstructive sleep apnea) 05/25/2014  . Chronic anticoagulation- Eliquis 01/27/2013  . ALLERGIC RHINITIS 05/04/2008  . GERD 05/04/2008  . POSTMENOPAUSAL STATUS 05/04/2008  . Asthma 05/04/2008    Past Medical History:  Diagnosis Date  . Acid reflux disease   . Allergy   . Asthma    a. Remotely.  . Chronic combined systolic and diastolic CHF (congestive heart failure) ([place])    a. Felt related to AF. EF 45-50% by echo 12/2012.  Marland Kitchen CVA (  cerebral vascular accident) ([place])    a. Thromboemoblic L MCA CVA 81/7711 with no residual deficit except mild occasional word finding. b. 6-57% RICA/LICA by dopplers 90/3/83.  Marland Kitchen GERD (gastroesophageal reflux disease)   . H/O medication noncompliance    a. Per PCP note 01/06/13: Previously refused anything greater than aspirin, refused treatment for DM, and often stopped hypertensive meds and lipid meds.  Marland Kitchen Hx of rheumatic fever    a. As a child.  . Hyperlipidemia   . Hypertension   . Kidney stones    a. Remotely.  . Obesity   . PAF (paroxysmal atrial fibrillation) ([place])    a.  Dx 09/2012, chronicity unclear at that time. Initially refused Xarelto due to risk of bleeding. b. Sustained CVA 12/2012 - agreeable to taking Eliquis. Tachybrady syndrome during that admission s/p Medtronic PPM implantation;  c. Failed flecainide, sotalol, amiodarone (itching), and propafenone.  . Tachy-brady syndrome ([place])    a. AF RVR with pauses >5sec requiring Medtronic PPM 12/2012.  . Tubular adenoma of colon 05/2008  . Type 2 diabetes mellitus ([place])     Past Surgical History:  Procedure Laterality Date  . CHOLECYSTECTOMY    . DESTRUCTION TRIGEMINAL NERVE VIA NEUROLYTIC AGENT  NOv @013    Gamma Knife   . INSERT / REPLACE / REMOVE PACEMAKER    . PACEMAKER INSERTION  12-30-12   MDT dual chamber pacemaker implanted by Dr Caryl Comes for tachy-brady syndrome  . PERMANENT PACEMAKER INSERTION N/A 12/30/2012   Procedure: PERMANENT PACEMAKER INSERTION;  Surgeon: Deboraha Sprang, MD;  Location: [facility] CATH LAB;  Service: Cardiovascular;  Laterality: N/A;  . TUBAL LIGATION     ectopic preganacy     Social History   Socioeconomic History  . Marital status: Married    Spouse name: Not on file  . Number of children: 2  . Years of education: Not on file  . Highest education level: Not on file  Occupational History  . Occupation: Paediatric nurse: [employer]  . Financial resource strain: Not on file  . Food insecurity    Worry: Not on file    Inability: Not on file  . Transportation needs    Medical: Not on file    Non-medical: Not on file  Tobacco Use  . Smoking status: Former Smoker    Packs/day: 2.00    Types: Cigarettes    Quit date: 01/24/1968    Years since quitting: 50.7  . Smokeless tobacco: Never Used  Substance and Sexual Activity  . Alcohol use: Yes    Alcohol/week: 1.0 standard drinks    Types: 1 Glasses of wine per week    Comment: rare use  . Drug use: No  . Sexual activity: Yes  Lifestyle  . Physical activity    Days per week: Not on file     Minutes per session: Not on file  . Stress: Not on file  Relationships  . Social Herbalist on phone: Not on file    Gets together: Not on file    Attends religious service: Not on file    Active member of club or organization: Not on file    Attends meetings of clubs or organizations: Not on file    Relationship status: Not on file  . Intimate partner violence    Fear of current or ex partner: Not on file    Emotionally abused: Not on file    Physically abused: Not on  file    Forced sexual activity: Not on file  Other Topics Concern  . Not on file  Social History Narrative   Lives in [place] with her husband.  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    Family History  Problem Relation Age of Onset  . Stroke Mother   . Diabetes Mother     Allergies  Allergen Reactions  . Bee Venom Anaphylaxis  . Amiodarone Itching  . Crestor [Rosuvastatin]     myalgia  . Fish Allergy Swelling    Shell fish  . Iodine Itching    itching  . Lipitor [Atorvastatin]     myalgias  . Penicillins Hives and Itching  . Ace Inhibitors Cough  . Lidocaine Hcl Palpitations    ALL CAINES given during dental procedures    Medication list reviewed and updated in full in [place].   GEN: No acute illnesses, no fevers, chills. GI: No n/v/d, eating normally Pulm: No SOB Interactive and getting along well at home.  Otherwise, ROS is as per the HPI.  Objective:   BP (!) 148/75   Pulse 89   Temp 98.7 F (37.1 C) (Temporal)   Ht 5' 2"  (1.575 m)   Wt 205 lb 12 oz (93.3 kg)   SpO2 96%   BMI 37.63 kg/m   GEN: WDWN, NAD, Non-toxic, A & O x 3 HEENT: Atraumatic, Normocephalic. Neck supple. No masses, No LAD. Ears and Nose: No external deformity. CV: RRR, No M/G/R. No JVD. No thrill. No extra heart sounds. PULM: CTA B, no wheezes, crackles, rhonchi. No retractions. No resp. distress. No accessory muscle use. EXTR: No c/c/e NEURO Normal gait.  PSYCH: Normally  interactive. Conversant. Not depressed or anxious appearing.  Calm demeanor.  Abd rash  Laboratory and Imaging Data: Lipids: Lab Results  Component Value Date   CHOL 168 07/29/2018   Lab Results  Component Value Date   HDL 40.30 07/29/2018   Lab Results  Component Value Date   LDLCALC 89 04/16/2017   Lab Results  Component Value Date   TRIG 205.0 (H) 07/29/2018   Lab Results  Component Value Date   CHOLHDL 4 07/29/2018   CBC: CBC Latest Ref Rng & Units 07/29/2018 04/16/2017 09/11/2016  WBC 4.0 - 10.5 K/uL 7.2 5.0 6.1  Hemoglobin 12.0 - 15.0 g/dL 14.3 14.6 14.9  Hematocrit 36.0 - 46.0 % 43.2 43.1 44.7  Platelets 150.0 - 400.0 K/uL 227.0 201.0 779.3    Basic Metabolic Panel:    Component Value Date/Time   NA 139 07/29/2018 0947   NA 139 05/09/2013 2023   K 4.7 07/29/2018 0947   K 3.9 05/09/2013 2023   CL 104 07/29/2018 0947   CL 103 05/09/2013 2023   CO2 27 07/29/2018 0947   CO2 29 05/09/2013 2023   BUN 17 07/29/2018 0947   BUN 14 05/09/2013 2023   CREATININE 0.99 07/29/2018 0947   CREATININE 0.97 05/09/2013 2023   GLUCOSE 187 (H) 07/29/2018 0947   GLUCOSE 141 (H) 05/09/2013 2023   CALCIUM 9.5 07/29/2018 0947   CALCIUM 9.5 05/09/2013 2023   Hepatic Function Latest Ref Rng & Units 07/29/2018 04/16/2017 09/11/2016  Total Protein 6.0 - 8.3 g/dL 6.5 7.1 7.1  Albumin 3.5 - 5.2 g/dL 4.3 4.1 4.0  AST 0 - 37 U/L 15 17 20   ALT 0 - 35 U/L 25 26 33  Alk Phosphatase 39 - 117 U/L 78 84 81  Total Bilirubin 0.2 - 1.2 mg/dL 0.6  0.8 0.7  Bilirubin, Direct 0.0 - 0.3 mg/dL 0.1 0.1 0.2    Lab Results  Component Value Date   HGBA1C 8.0 (H) 07/29/2018   Lab Results  Component Value Date   TSH 2.27 07/29/2018    Assessment and Plan:     ICD-10-CM   1. Type 2 diabetes mellitus with vascular disease (HCC)  E11.59   2. Cardiac pacemaker in situ  Z95.0   3. Atrial fibrillation, unspecified type ([place])  I48.91   4. Cerebrovascular accident (CVA), unspecified mechanism ([place])   I63.9   5. Rash  R21    DM worsened with poor diet compliance  AF, cards defer  Compliant with CVA / heart meds  Rash NOS, improved with husbands TAC, so try this and if no better, add anti-fungal  Follow-up: Return in about 6 months (around 03/23/2019) for diabetes follow-up.  Meds ordered this encounter  Medications  . triamcinolone cream (KENALOG) 0.1 %    Sig: Apply 1 application topically 2 (two) times daily.    Dispense:  454 g    Refill:  0   No orders of the defined types were placed in this encounter.   Signed,  Maud Deed. Kyrstal Monterrosa, MD   Outpatient Encounter Medications as of 09/23/2018  Medication Sig  . ELIQUIS 5 MG TABS tablet TAKE 1 TABLET BY MOUTH TWICE DAILY  . glipiZIDE (GLUCOTROL XL) 10 MG 24 hr tablet TAKE 2 TABLETS BY MOUTH EVERY DAY WITH BREAKFAST  . hydrochlorothiazide (HYDRODIURIL) 12.5 MG tablet Take 1 tablet (12.5 mg total) by mouth daily.  Marland Kitchen JANUVIA 100 MG tablet TAKE 1 TABLET(100 MG) BY MOUTH DAILY  . losartan (COZAAR) 100 MG tablet TAKE 1 TABLET BY MOUTH EVERY DAY  . metoprolol tartrate (LOPRESSOR) 100 MG tablet TAKE 1 TABLET BY MOUTH TWICE DAILY  . mometasone (NASONEX) 50 MCG/ACT nasal spray Place 2 sprays into the nose daily. (Patient taking differently: Place 2 sprays into the nose as needed. )  . montelukast (SINGULAIR) 10 MG tablet TAKE 1 TABLET BY MOUTH AT BEDTIME  . simvastatin (ZOCOR) 80 MG tablet TAKE 1 TABLET BY MOUTH DAILY  . triamcinolone cream (KENALOG) 0.1 % Apply 1 application topically 2 (two) times daily.  . [DISCONTINUED] pantoprazole (PROTONIX) 40 MG tablet Take 40 mg by mouth daily as needed (for acid reflux).   No facility-administered encounter medications on file as of 09/23/2018.

## 2018-09-23 ENCOUNTER — Ambulatory Visit (INDEPENDENT_AMBULATORY_CARE_PROVIDER_SITE_OTHER): Payer: Medicare Other | Admitting: Family Medicine

## 2018-09-23 ENCOUNTER — Encounter: Payer: Self-pay | Admitting: Family Medicine

## 2018-09-23 ENCOUNTER — Other Ambulatory Visit: Payer: Self-pay

## 2018-09-23 VITALS — BP 148/75 | HR 89 | Temp 98.7°F | Ht 62.0 in | Wt 205.8 lb

## 2018-09-23 DIAGNOSIS — R21 Rash and other nonspecific skin eruption: Secondary | ICD-10-CM | POA: Diagnosis not present

## 2018-09-23 DIAGNOSIS — E1159 Type 2 diabetes mellitus with other circulatory complications: Secondary | ICD-10-CM

## 2018-09-23 DIAGNOSIS — I639 Cerebral infarction, unspecified: Secondary | ICD-10-CM | POA: Diagnosis not present

## 2018-09-23 DIAGNOSIS — Z950 Presence of cardiac pacemaker: Secondary | ICD-10-CM

## 2018-09-23 DIAGNOSIS — I4891 Unspecified atrial fibrillation: Secondary | ICD-10-CM

## 2018-09-23 MED ORDER — TRIAMCINOLONE ACETONIDE 0.1 % EX CREA: 1.0000 "application " | TOPICAL_CREAM | Freq: Two times a day (BID) | CUTANEOUS | 0 refills | Status: DC

## 2018-10-07 ENCOUNTER — Other Ambulatory Visit: Payer: Self-pay | Admitting: *Deleted

## 2018-10-07 MED ORDER — APIXABAN 5 MG PO TABS: 5.0000 mg | ORAL_TABLET | Freq: Two times a day (BID) | ORAL | 5 refills | Status: DC

## 2018-10-07 NOTE — Telephone Encounter (Signed)
Eliquis 5mg  refill request received; pt is 73 yrs old, wt-92.2kg, Crea-0.99 on 07/29/2018, last seen by Dr. Caryl Comes on 07/31/2017 and pt has an appt on 10/15/2018; will send in another refill.

## 2018-10-15 ENCOUNTER — Ambulatory Visit (INDEPENDENT_AMBULATORY_CARE_PROVIDER_SITE_OTHER): Payer: Medicare Other | Admitting: Internal Medicine

## 2018-10-15 ENCOUNTER — Encounter: Payer: Self-pay | Admitting: Internal Medicine

## 2018-10-15 ENCOUNTER — Other Ambulatory Visit: Payer: Self-pay

## 2018-10-15 VITALS — BP 144/86 | HR 64 | Ht 62.0 in | Wt 205.0 lb

## 2018-10-15 DIAGNOSIS — Z950 Presence of cardiac pacemaker: Secondary | ICD-10-CM

## 2018-10-15 DIAGNOSIS — I480 Paroxysmal atrial fibrillation: Secondary | ICD-10-CM

## 2018-10-15 DIAGNOSIS — I495 Sick sinus syndrome: Secondary | ICD-10-CM | POA: Diagnosis not present

## 2018-10-15 DIAGNOSIS — I639 Cerebral infarction, unspecified: Secondary | ICD-10-CM

## 2018-10-15 LAB — CUP PACEART INCLINIC DEVICE CHECK
Battery Remaining Longevity: 54 mo
Battery Voltage: 2.99 V
Brady Statistic AP VP Percent: 0.08 %
Brady Statistic AP VS Percent: 38.77 %
Brady Statistic AS VP Percent: 0.03 %
Brady Statistic AS VS Percent: 61.12 %
Brady Statistic RA Percent Paced: 38.8 %
Brady Statistic RV Percent Paced: 0.11 %
Date Time Interrogation Session: 20200922101709
Implantable Lead Implant Date: 20141208
Implantable Lead Implant Date: 20141208
Implantable Lead Location: 753859
Implantable Lead Location: 753860
Implantable Lead Model: 5076
Implantable Lead Model: 5076
Implantable Pulse Generator Implant Date: 20141208
Lead Channel Impedance Value: 380 Ohm
Lead Channel Impedance Value: 399 Ohm
Lead Channel Impedance Value: 418 Ohm
Lead Channel Impedance Value: 437 Ohm
Lead Channel Pacing Threshold Amplitude: 0.5 V
Lead Channel Pacing Threshold Amplitude: 0.75 V
Lead Channel Pacing Threshold Pulse Width: 0.4 ms
Lead Channel Pacing Threshold Pulse Width: 0.4 ms
Lead Channel Sensing Intrinsic Amplitude: 2.75 mV
Lead Channel Sensing Intrinsic Amplitude: 9.625 mV
Lead Channel Setting Pacing Amplitude: 2 V
Lead Channel Setting Pacing Amplitude: 2.5 V
Lead Channel Setting Pacing Pulse Width: 0.4 ms
Lead Channel Setting Sensing Sensitivity: 2 mV

## 2018-10-15 NOTE — Patient Instructions (Addendum)
Medication Instructions:  - Your physician recommends that you continue on your current medications as directed. Please refer to the Current Medication list given to you today.  If you need a refill on your cardiac medications before your next appointment, please call your pharmacy.   Lab work: - none ordered  If you have labs (blood work) drawn today and your tests are completely normal, you will receive your results only by: Marland Kitchen MyChart Message (if you have MyChart) OR . A paper copy in the mail If you have any lab test that is abnormal or we need to change your treatment, we will call you to review the results.  Testing/Procedures: - none ordered  Follow-Up: At [facility] [place], you and your health needs are our priority.  As part of our continuing mission to provide you with exceptional heart care, we have created designated Provider Care Teams.  These Care Teams include your primary Cardiologist (physician) and Advanced Practice Providers (APPs -  Physician Assistants and Nurse Practitioners) who all work together to provide you with the care you need, when you need it. You will need a follow up appointment in 1 year (September 2021) with Dr. Caryl Comes.   Marland Kitchen Please call our office 2 months in advance to schedule this appointment.  (Call in early July 2021 to schedule).   - Remote monitoring is used to monitor your Pacemaker of ICD from home. This monitoring reduces the number of office visits required to check your device to one time per year. It allows [name] to keep an eye on the functioning of your device to ensure it is working properly. You are scheduled for a device check from home on 10/29/2018. You may send your transmission at any time that day. If you have a wireless device, the transmission will be sent automatically. After your physician reviews your transmission, you will receive a postcard with your next transmission date.   Any Other Special Instructions Will Be Listed Below (If  Applicable). - N/A

## 2018-10-15 NOTE — Progress Notes (Signed)
Patient Care Team: [name], MD as PCP - General   HPI  Nina Carpenter is a 73 y.o. female Seen in follow-up for atrial fibrillation and tachybradysyndrome for which she underwent pacing 2014. PVI at [facility] 2015 There is been a marked diminution in atrial fibrillation symptoms and by device interrogation burden as well. She does have some nonsustained atrial tachycardia/slow flutter    Thromboembolic risk factors ( age -85, HTN-1, TIA/CVA-2, DM-1, Gender-1) for a CHADSVASc Score of 6  The patient denies chest pain, shortness of breath, nocturnal dyspnea, orthopnea or peripheral edema.  There have been no palpitations, lightheadedness or syncope.   Date Cr K Hgb  3/19 0.83 4.6 14.6  7/20 0.99 4.7 14.3     Past Medical History:  Diagnosis Date  . Acid reflux disease   . Allergy   . Asthma    a. Remotely.  . Chronic combined systolic and diastolic CHF (congestive heart failure) ([place])    a. Felt related to AF. EF 45-50% by echo 12/2012.  Marland Kitchen CVA (cerebral vascular accident) ([place])    a. Thromboemoblic L MCA CVA 123456 with no residual deficit except mild occasional word finding. b. 123456 RICA/LICA by dopplers [id number].  Marland Kitchen GERD (gastroesophageal reflux disease)   . H/O medication noncompliance    a. Per PCP note 01/06/13: Previously refused anything greater than aspirin, refused treatment for DM, and often stopped hypertensive meds and lipid meds.  Marland Kitchen Hx of rheumatic fever    a. As a child.  . Hyperlipidemia   . Hypertension   . Kidney stones    a. Remotely.  . Obesity   . PAF (paroxysmal atrial fibrillation) ([place])    a. Dx 09/2012, chronicity unclear at that time. Initially refused Xarelto due to risk of bleeding. b. Sustained CVA 12/2012 - agreeable to taking Eliquis. Tachybrady syndrome during that admission s/p Medtronic PPM implantation;  c. Failed flecainide, sotalol, amiodarone (itching), and propafenone.  . Tachy-brady syndrome ([place])    a. AF RVR with pauses  >5sec requiring Medtronic PPM 12/2012.  . Tubular adenoma of colon 05/2008  . Type 2 diabetes mellitus ([place])     Past Surgical History:  Procedure Laterality Date  . CHOLECYSTECTOMY    . DESTRUCTION TRIGEMINAL NERVE VIA NEUROLYTIC AGENT  NOv @013    Gamma Knife   . INSERT / REPLACE / REMOVE PACEMAKER    . PACEMAKER INSERTION  12-30-12   MDT dual chamber pacemaker implanted by Dr Caryl Comes for tachy-brady syndrome  . PERMANENT PACEMAKER INSERTION N/A 12/30/2012   Procedure: PERMANENT PACEMAKER INSERTION;  Surgeon: Deboraha Sprang, MD;  Location: [facility] CATH LAB;  Service: Cardiovascular;  Laterality: N/A;  . TUBAL LIGATION     ectopic preganacy     Current Outpatient Medications  Medication Sig Dispense Refill  . apixaban (ELIQUIS) 5 MG TABS tablet Take 1 tablet (5 mg total) by mouth 2 (two) times daily. 60 tablet 5  . glipiZIDE (GLUCOTROL XL) 10 MG 24 hr tablet TAKE 2 TABLETS BY MOUTH EVERY DAY WITH BREAKFAST 180 tablet 1  . hydrochlorothiazide (HYDRODIURIL) 12.5 MG tablet Take 1 tablet (12.5 mg total) by mouth daily. 90 tablet 3  . JANUVIA 100 MG tablet TAKE 1 TABLET(100 MG) BY MOUTH DAILY 90 tablet 1  . losartan (COZAAR) 100 MG tablet TAKE 1 TABLET BY MOUTH EVERY DAY 90 tablet 1  . metoprolol tartrate (LOPRESSOR) 100 MG tablet TAKE 1 TABLET BY MOUTH TWICE DAILY 180 tablet 1  .  mometasone (NASONEX) 50 MCG/ACT nasal spray Place 2 sprays into the nose daily. (Patient taking differently: Place 2 sprays into the nose as needed. ) 17 g 2  . montelukast (SINGULAIR) 10 MG tablet TAKE 1 TABLET BY MOUTH AT BEDTIME 90 tablet 3  . simvastatin (ZOCOR) 80 MG tablet TAKE 1 TABLET BY MOUTH DAILY 90 tablet 1  . triamcinolone cream (KENALOG) 0.1 % Apply 1 application topically 2 (two) times daily. 454 g 0   No current facility-administered medications for this visit.     Allergies  Allergen Reactions  . Bee Venom Anaphylaxis  . Amiodarone Itching  . Crestor [Rosuvastatin]     myalgia  . Fish Allergy  Swelling    Shell fish  . Iodine Itching    itching  . Lipitor [Atorvastatin]     myalgias  . Penicillins Hives and Itching  . Ace Inhibitors Cough  . Lidocaine Hcl Palpitations    ALL CAINES given during dental procedures    Review of Systems negative except from HPI and PMH  Physical Exam  BP (!) 144/86 (BP Location: Left Arm, Patient Position: Sitting, Cuff Size: Large)   Pulse 64   Ht 5\' 2"  (1.575 m)   Wt 205 lb (93 kg)   BMI 37.49 kg/m  Well developed and nourished in no acute distress HENT normal Neck supple with JVP-  flat   Clear Regular rate and rhythm, no murmurs or gallops Abd-soft with active BS No Clubbing cyanosis edema Skin-warm and dry A & Oriented  Grossly normal sensory and motor function  ECG sinus @ 64 17/*18/45  Assessment and  Plan  Atrial fibrillation-paroxysmal status post PVI [facility] 4/15  Hypertension  Hyperlipidemia  Sinus node dysfunction   HFpEF  Pacemaker Medtronic The patient's device was interrogated.  The information was reviewed. No changes were made in the programming.      BP reasonably controlled  Euvolemic continue current meds

## 2018-10-20 ENCOUNTER — Other Ambulatory Visit: Payer: Self-pay | Admitting: Family Medicine

## 2018-10-22 ENCOUNTER — Other Ambulatory Visit: Payer: Self-pay | Admitting: Family Medicine

## 2018-10-24 ENCOUNTER — Telehealth: Payer: Self-pay

## 2018-10-24 MED ORDER — SIMVASTATIN 80 MG PO TABS: 80.0000 mg | ORAL_TABLET | Freq: Every day | ORAL | 3 refills | Status: DC

## 2018-10-24 NOTE — Telephone Encounter (Signed)
Requested Prescriptions   Signed Prescriptions Disp Refills  . simvastatin (ZOCOR) 80 MG tablet 90 tablet 3    Sig: Take 1 tablet (80 mg total) by mouth daily.    Authorizing Provider: Deboraha Sprang    Ordering User: Raelene Bott, Gracieann Stannard L

## 2018-10-29 ENCOUNTER — Ambulatory Visit (INDEPENDENT_AMBULATORY_CARE_PROVIDER_SITE_OTHER): Payer: Medicare Other | Admitting: *Deleted

## 2018-10-29 DIAGNOSIS — I639 Cerebral infarction, unspecified: Secondary | ICD-10-CM

## 2018-10-29 DIAGNOSIS — I495 Sick sinus syndrome: Secondary | ICD-10-CM | POA: Diagnosis not present

## 2018-10-31 LAB — CUP PACEART REMOTE DEVICE CHECK
Battery Remaining Longevity: 54 mo
Battery Voltage: 2.99 V
Brady Statistic AP VP Percent: 0.06 %
Brady Statistic AP VS Percent: 40.16 %
Brady Statistic AS VP Percent: 0.03 %
Brady Statistic AS VS Percent: 59.75 %
Brady Statistic RA Percent Paced: 40.19 %
Brady Statistic RV Percent Paced: 0.09 %
Date Time Interrogation Session: 20201006180009
Implantable Lead Implant Date: 20141208
Implantable Lead Implant Date: 20141208
Implantable Lead Location: 753859
Implantable Lead Location: 753860
Implantable Lead Model: 5076
Implantable Lead Model: 5076
Implantable Pulse Generator Implant Date: 20141208
Lead Channel Impedance Value: 361 Ohm
Lead Channel Impedance Value: 380 Ohm
Lead Channel Impedance Value: 399 Ohm
Lead Channel Impedance Value: 456 Ohm
Lead Channel Pacing Threshold Amplitude: 0.625 V
Lead Channel Pacing Threshold Amplitude: 0.625 V
Lead Channel Pacing Threshold Pulse Width: 0.4 ms
Lead Channel Pacing Threshold Pulse Width: 0.4 ms
Lead Channel Sensing Intrinsic Amplitude: 2 mV
Lead Channel Sensing Intrinsic Amplitude: 2 mV
Lead Channel Sensing Intrinsic Amplitude: 7 mV
Lead Channel Sensing Intrinsic Amplitude: 7 mV
Lead Channel Setting Pacing Amplitude: 2 V
Lead Channel Setting Pacing Amplitude: 2.5 V
Lead Channel Setting Pacing Pulse Width: 0.4 ms
Lead Channel Setting Sensing Sensitivity: 2 mV

## 2018-11-08 NOTE — Progress Notes (Signed)
Remote pacemaker transmission.   

## 2019-01-28 ENCOUNTER — Ambulatory Visit (INDEPENDENT_AMBULATORY_CARE_PROVIDER_SITE_OTHER): Payer: Medicare Other | Admitting: *Deleted

## 2019-01-28 DIAGNOSIS — I495 Sick sinus syndrome: Secondary | ICD-10-CM

## 2019-01-28 LAB — CUP PACEART REMOTE DEVICE CHECK
Battery Remaining Longevity: 55 mo
Battery Voltage: 2.99 V
Brady Statistic AP VP Percent: 0.06 %
Brady Statistic AP VS Percent: 38.27 %
Brady Statistic AS VP Percent: 0.03 %
Brady Statistic AS VS Percent: 61.64 %
Brady Statistic RA Percent Paced: 38.28 %
Brady Statistic RV Percent Paced: 0.09 %
Date Time Interrogation Session: 20210105103756
Implantable Lead Implant Date: 20141208
Implantable Lead Implant Date: 20141208
Implantable Lead Location: 753859
Implantable Lead Location: 753860
Implantable Lead Model: 5076
Implantable Lead Model: 5076
Implantable Pulse Generator Implant Date: 20141208
Lead Channel Impedance Value: 380 Ohm
Lead Channel Impedance Value: 399 Ohm
Lead Channel Impedance Value: 437 Ohm
Lead Channel Impedance Value: 456 Ohm
Lead Channel Pacing Threshold Amplitude: 0.625 V
Lead Channel Pacing Threshold Amplitude: 0.625 V
Lead Channel Pacing Threshold Pulse Width: 0.4 ms
Lead Channel Pacing Threshold Pulse Width: 0.4 ms
Lead Channel Sensing Intrinsic Amplitude: 1.75 mV
Lead Channel Sensing Intrinsic Amplitude: 1.75 mV
Lead Channel Sensing Intrinsic Amplitude: 7.75 mV
Lead Channel Sensing Intrinsic Amplitude: 7.75 mV
Lead Channel Setting Pacing Amplitude: 2 V
Lead Channel Setting Pacing Amplitude: 2.5 V
Lead Channel Setting Pacing Pulse Width: 0.4 ms
Lead Channel Setting Sensing Sensitivity: 2 mV

## 2019-03-07 ENCOUNTER — Ambulatory Visit: Payer: Medicare Other | Attending: Internal Medicine

## 2019-03-07 DIAGNOSIS — Z23 Encounter for immunization: Secondary | ICD-10-CM

## 2019-03-07 NOTE — Progress Notes (Signed)
   Covid-19 Vaccination Clinic  Name:  Nina Carpenter    MRN: [id number] DOB: July 14, 1945  03/07/2019  Nina Carpenter was observed post Covid-19 immunization for 15 minutes without incidence. She was provided with Vaccine Information Sheet and instruction to access the V-Safe system.   Nina Carpenter was instructed to call 911 with any severe reactions post vaccine: Marland Kitchen Difficulty breathing  . Swelling of your face and throat  . A fast heartbeat  . A bad rash all over your body  . Dizziness and weakness    Immunizations Administered    Name Date Dose VIS Date Route   Pfizer COVID-19 Vaccine 03/07/2019 11:11 AM 0.3 mL 01/03/2019 Intramuscular   Manufacturer: [place]   Lot: [id number]   [place]: [id number]

## 2019-03-17 ENCOUNTER — Other Ambulatory Visit: Payer: Self-pay | Admitting: *Deleted

## 2019-03-17 DIAGNOSIS — E1159 Type 2 diabetes mellitus with other circulatory complications: Secondary | ICD-10-CM

## 2019-03-17 MED ORDER — SITAGLIPTIN PHOSPHATE 100 MG PO TABS: ORAL_TABLET | ORAL | 1 refills | Status: DC

## 2019-03-23 NOTE — Progress Notes (Signed)
Anyela Napierkowski T. Toluwanimi Radebaugh, MD [facility] and [place] at [facility] [place], [address] Phone: [phone]  FAX: [phone]  Nina Carpenter - 74 y.o. female  MRN [id number]  Date of Birth: 20-Apr-1945  Visit Date: 03/24/2019  PCP: [name], MD  Referred by: [name], MD  Chief Complaint  Patient presents with  . Diabetes    This visit occurred during the SARS-CoV-2 public health emergency.  Safety protocols were in place, including screening questions prior to the visit, additional usage of staff PPE, and extensive cleaning of exam room while observing appropriate contact time as indicated for disinfecting solutions.   Subjective:   Nina Carpenter is a 74 y.o. very pleasant female patient who presents with the following:  She presents today for follow-up of multiple medical problems.  Diabetes Mellitus: Tolerating Medications: yes Compliance with diet: fair, Body mass index is 38.5 kg/m. Exercise: minimal / intermittent Avg blood sugars at home: not checking Foot problems: none Hypoglycemia: none No nausea, vomitting, blurred vision, polyuria.  Lab Results  Component Value Date   HGBA1C 9.0 (A) 03/24/2019   HGBA1C 8.0 (H) 07/29/2018   HGBA1C 7.1 (A) 01/28/2018   Lab Results  Component Value Date   MICROALBUR 1.3 07/29/2018   LDLCALC 89 04/16/2017   CREATININE 0.99 07/29/2018    Wt Readings from Last 3 Encounters:  03/24/19 210 lb 8 oz (95.5 kg)  10/15/18 205 lb (93 kg)  09/23/18 205 lb 12 oz (93.3 kg)    Max with Januvia and Glipizide    HTN: Tolerating all medications without side effects Stable and at goal No CP, no sob. No HA.  BP Readings from Last 3 Encounters:  03/24/19 (!) 151/81  10/15/18 (!) 144/86  09/23/18 (!) [id number]    Basic Metabolic Panel:    Component Value Date/Time   NA 139 07/29/2018 0947   NA 139 05/09/2013 2023   K 4.7 07/29/2018 0947   K 3.9  05/09/2013 2023   CL 104 07/29/2018 0947   CL 103 05/09/2013 2023   CO2 27 07/29/2018 0947   CO2 29 05/09/2013 2023   BUN 17 07/29/2018 0947   BUN 14 05/09/2013 2023   CREATININE 0.99 07/29/2018 0947   CREATININE 0.97 05/09/2013 2023   GLUCOSE 187 (H) 07/29/2018 0947   GLUCOSE 141 (H) 05/09/2013 2023   CALCIUM 9.5 07/29/2018 0947   CALCIUM 9.5 05/09/2013 2023    Lipids: Doing well, stable. Tolerating meds fine with no SE. Panel reviewed with patient.  Lipids:    Component Value Date/Time   CHOL 168 07/29/2018 0947   TRIG 205.0 (H) 07/29/2018 0947   HDL 40.30 07/29/2018 0947   LDLDIRECT 103.0 07/29/2018 0947   VLDL 41.0 (H) 07/29/2018 0947   CHOLHDL 4 07/29/2018 0947    Lab Results  Component Value Date   ALT 25 07/29/2018   AST 15 07/29/2018   ALKPHOS 78 07/29/2018   BILITOT 0.6 07/29/2018    She also has a history of stroke and a history of atrial fibrillation.    Review of Systems is noted in the HPI, as appropriate  Objective:   BP (!) 151/81 (BP Location: Left Arm, Cuff Size: Normal) Comment: Patient's BP machine  Pulse 78   Temp 97.7 F (36.5 C) (Temporal)   Ht 5\' 2"  (1.575 m)   Wt 210 lb 8 oz (95.5 kg)   SpO2 96%   BMI 38.50 kg/m  GEN: WDWN, NAD, Non-toxic HEENT: Atraumatic, Normocephalic. Neck supple. No masses. CV: RRR, No M/G/R. No JVD. No thrill. No extra heart sounds. PULM: CTA B, no wheezes, crackles, rhonchi. No retractions. No resp. distress. No accessory muscle use. EXTR: No c/c/e NEURO Normal gait.  PSYCH: Normally interactive. Conversant.   Laboratory and Imaging Data: Results for orders placed or performed in visit on 03/24/19  POCT glycosylated hemoglobin (Hb A1C)  Result Value Ref Range   Hemoglobin A1C 9.0 (A) 4.0 - 5.6 %   HbA1c POC (<> result, manual entry)     HbA1c, POC (prediabetic range)     HbA1c, POC (controlled diabetic range)       Assessment and Plan:     ICD-10-CM   1. Type 2 diabetes mellitus with vascular  disease (HCC)  E11.59 POCT glycosylated hemoglobin (Hb A1C)  2. Essential hypertension  I10   3. Hyperlipidemia LDL goal <70  E78.5   4. History of stroke  Z86.73    Patient Instructions  1.  Start Metformin 1 tablet a day for 2 weeks, then increase to 2 tablets a day after 2 weeks.  2.  Start with pioglitazone 15 mg, 1 tablet a day for 1 month, then CHANGE TO 30 MG A DAY - I WILL SEND IN A SEPARATE PRESCRIPTION FOR THIS  Blood pressure: We need to increase your HYDROCHLOROTHIAZIDE.  Increase this to 25 mg a day.  I sent in a new prescription.  This doubles your dose.     Follow-up: Return in about 3 months (around 06/24/2019) for diabetes and blood pressure check.  Meds ordered this encounter  Medications  . pioglitazone (ACTOS) 15 MG tablet    Sig: Take 1 tablet (15 mg total) by mouth daily.    Dispense:  30 tablet    Refill:  0  . metFORMIN (GLUCOPHAGE-XR) 500 MG 24 hr tablet    Sig: Take 2 tablets (1,000 mg total) by mouth daily with breakfast.    Dispense:  60 tablet    Refill:  5  . pioglitazone (ACTOS) 30 MG tablet    Sig: Take 1 tablet (30 mg total) by mouth daily.    Dispense:  30 tablet    Refill:  5    This is for refill / increased dose after 1 month of 15 mg  . hydrochlorothiazide (HYDRODIURIL) 25 MG tablet    Sig: Take 1 tablet (25 mg total) by mouth daily.    Dispense:  30 tablet    Refill:  5   Medications Discontinued During This Encounter  Medication Reason  . hydrochlorothiazide (HYDRODIURIL) 12.5 MG tablet    Orders Placed This Encounter  Procedures  . POCT glycosylated hemoglobin (Hb A1C)    Signed,  Von Inscoe T. Kostas Marrow, MD   Outpatient Encounter Medications as of 03/24/2019  Medication Sig  . apixaban (ELIQUIS) 5 MG TABS tablet Take 1 tablet (5 mg total) by mouth 2 (two) times daily.  Marland Kitchen glipiZIDE (GLUCOTROL XL) 10 MG 24 hr tablet TAKE 2 TABLETS BY MOUTH EVERY DAY WITH BREAKFAST  . hydrochlorothiazide (HYDRODIURIL) 25 MG tablet Take 1 tablet (25 mg  total) by mouth daily.  Marland Kitchen losartan (COZAAR) 100 MG tablet TAKE 1 TABLET BY MOUTH EVERY DAY  . metoprolol tartrate (LOPRESSOR) 100 MG tablet TAKE 1 TABLET BY MOUTH TWICE DAILY  . mometasone (NASONEX) 50 MCG/ACT nasal spray Place 2 sprays into the nose daily. (Patient taking differently: Place 2 sprays into the nose as needed. )  .  montelukast (SINGULAIR) 10 MG tablet TAKE 1 TABLET BY MOUTH AT BEDTIME  . simvastatin (ZOCOR) 80 MG tablet Take 1 tablet (80 mg total) by mouth daily.  . sitaGLIPtin (JANUVIA) 100 MG tablet TAKE 1 TABLET(100 MG) BY MOUTH DAILY  . triamcinolone cream (KENALOG) 0.1 % Apply 1 application topically 2 (two) times daily.  . [DISCONTINUED] hydrochlorothiazide (HYDRODIURIL) 12.5 MG tablet Take 1 tablet (12.5 mg total) by mouth daily.  . metFORMIN (GLUCOPHAGE-XR) 500 MG 24 hr tablet Take 2 tablets (1,000 mg total) by mouth daily with breakfast.  . pioglitazone (ACTOS) 15 MG tablet Take 1 tablet (15 mg total) by mouth daily.  . pioglitazone (ACTOS) 30 MG tablet Take 1 tablet (30 mg total) by mouth daily.   No facility-administered encounter medications on file as of 03/24/2019.

## 2019-03-24 ENCOUNTER — Ambulatory Visit (INDEPENDENT_AMBULATORY_CARE_PROVIDER_SITE_OTHER): Payer: Medicare Other | Admitting: Family Medicine

## 2019-03-24 ENCOUNTER — Other Ambulatory Visit: Payer: Self-pay

## 2019-03-24 ENCOUNTER — Encounter: Payer: Self-pay | Admitting: Family Medicine

## 2019-03-24 VITALS — BP 151/81 | HR 78 | Temp 97.7°F | Ht 62.0 in | Wt 210.5 lb

## 2019-03-24 DIAGNOSIS — I10 Essential (primary) hypertension: Secondary | ICD-10-CM

## 2019-03-24 DIAGNOSIS — Z8673 Personal history of transient ischemic attack (TIA), and cerebral infarction without residual deficits: Secondary | ICD-10-CM | POA: Diagnosis not present

## 2019-03-24 DIAGNOSIS — E1159 Type 2 diabetes mellitus with other circulatory complications: Secondary | ICD-10-CM | POA: Diagnosis not present

## 2019-03-24 DIAGNOSIS — E785 Hyperlipidemia, unspecified: Secondary | ICD-10-CM

## 2019-03-24 LAB — POCT GLYCOSYLATED HEMOGLOBIN (HGB A1C): Hemoglobin A1C: 9 % — AB (ref 4.0–5.6)

## 2019-03-24 MED ORDER — HYDROCHLOROTHIAZIDE 25 MG PO TABS: 25.0000 mg | ORAL_TABLET | Freq: Every day | ORAL | 5 refills | Status: DC

## 2019-03-24 MED ORDER — METFORMIN HCL ER 500 MG PO TB24: 1000.0000 mg | ORAL_TABLET | Freq: Every day | ORAL | 5 refills | Status: DC

## 2019-03-24 MED ORDER — PIOGLITAZONE HCL 15 MG PO TABS: 15.0000 mg | ORAL_TABLET | Freq: Every day | ORAL | 0 refills | Status: DC

## 2019-03-24 MED ORDER — PIOGLITAZONE HCL 30 MG PO TABS: 30.0000 mg | ORAL_TABLET | Freq: Every day | ORAL | 5 refills | Status: DC

## 2019-03-24 NOTE — Patient Instructions (Addendum)
1.  Start Metformin 1 tablet a day for 2 weeks, then increase to 2 tablets a day after 2 weeks.  2.  Start with pioglitazone 15 mg, 1 tablet a day for 1 month, then CHANGE TO 30 MG A DAY - I WILL SEND IN A SEPARATE PRESCRIPTION FOR THIS  Blood pressure: We need to increase your HYDROCHLOROTHIAZIDE.  Increase this to 25 mg a day.  I sent in a new prescription.  This doubles your dose.

## 2019-03-27 ENCOUNTER — Other Ambulatory Visit: Payer: Self-pay | Admitting: *Deleted

## 2019-03-27 MED ORDER — LOSARTAN POTASSIUM 100 MG PO TABS: 100.0000 mg | ORAL_TABLET | Freq: Every day | ORAL | 1 refills | Status: DC

## 2019-03-31 ENCOUNTER — Other Ambulatory Visit: Payer: Self-pay | Admitting: *Deleted

## 2019-03-31 MED ORDER — METOPROLOL TARTRATE 100 MG PO TABS: 100.0000 mg | ORAL_TABLET | Freq: Two times a day (BID) | ORAL | 1 refills | Status: DC

## 2019-04-01 ENCOUNTER — Ambulatory Visit: Payer: Medicare Other | Attending: Internal Medicine

## 2019-04-01 DIAGNOSIS — Z23 Encounter for immunization: Secondary | ICD-10-CM | POA: Insufficient documentation

## 2019-04-01 NOTE — Progress Notes (Signed)
   Covid-19 Vaccination Clinic  Name:  Nina Carpenter    MRN: [id number] DOB: 05-Mar-1945  04/01/2019  [name] was observed post Covid-19 immunization for 15 minutes without incident. She was provided with Vaccine Information Sheet and instruction to access the V-Safe system.   [name] was instructed to call 911 with any severe reactions post vaccine: Marland Kitchen Difficulty breathing  . Swelling of face and throat  . A fast heartbeat  . A bad rash all over body  . Dizziness and weakness   Immunizations Administered    Name Date Dose VIS Date Route   Pfizer COVID-19 Vaccine 04/01/2019 12:14 PM 0.3 mL 01/03/2019 Intramuscular   Manufacturer: [place]   Lot: [id number]   [place]: [id number]

## 2019-04-10 ENCOUNTER — Other Ambulatory Visit: Payer: Self-pay | Admitting: Internal Medicine

## 2019-04-11 NOTE — Telephone Encounter (Signed)
Pt last saw Dr Caryl Comes 10/15/18, last labs 07/29/18 Creat 0.99, age 74, weight 95.5kg, based on specified criteria pt is on appropriate dosage of Eliquis 5mg  BID.  Will refill rx.

## 2019-04-21 ENCOUNTER — Telehealth: Payer: Self-pay | Admitting: *Deleted

## 2019-04-21 ENCOUNTER — Other Ambulatory Visit: Payer: Self-pay | Admitting: *Deleted

## 2019-04-21 DIAGNOSIS — E1159 Type 2 diabetes mellitus with other circulatory complications: Secondary | ICD-10-CM

## 2019-04-21 MED ORDER — ACCU-CHEK GUIDE VI STRP: ORAL_STRIP | 1 refills | Status: DC

## 2019-04-21 MED ORDER — ACCU-CHEK GUIDE ME W/DEVICE KIT: 1.0000 | PACK | 0 refills | Status: AC

## 2019-04-21 MED ORDER — ACCU-CHEK FASTCLIX LANCETS MISC: 0 refills | Status: DC

## 2019-04-21 MED ORDER — ACCU-CHEK GUIDE CONTROL VI LIQD: 1.0000 | 0 refills | Status: AC

## 2019-04-21 MED ORDER — GLIPIZIDE ER 10 MG PO TB24: ORAL_TABLET | ORAL | 1 refills | Status: DC

## 2019-04-21 NOTE — Telephone Encounter (Signed)
Patient called stating that she is confused about her diabetic medications after reading her AVS. Pulled up notes from last office visit and read to her the directions and she verbalized understanding. Patient wants to know if she should be checking her blood sugar on a regular basis with her A1C being so high?  Patient stated that she is going to check with her insurance company to see what meter they will cover if she needs to get one. Patient is aware that Dr. Lorelei Pont is out of the office this week.

## 2019-04-21 NOTE — Telephone Encounter (Signed)
Left detailed message for [facility] with information below from Dr. Lorelei Pont.  Rx for glucose meter, test strips, control and lancets sent to [facility].

## 2019-04-21 NOTE — Telephone Encounter (Signed)
I think she would be fine to check it every 2 weeks.  Same day check fasting and once 2 hours after a meal.  Write down, meter type makes no difference.  Take all medicine  Thanks!

## 2019-04-24 DIAGNOSIS — M461 Sacroiliitis, not elsewhere classified: Secondary | ICD-10-CM | POA: Diagnosis not present

## 2019-04-24 DIAGNOSIS — M9904 Segmental and somatic dysfunction of sacral region: Secondary | ICD-10-CM | POA: Diagnosis not present

## 2019-04-24 DIAGNOSIS — D259 Leiomyoma of uterus, unspecified: Secondary | ICD-10-CM | POA: Diagnosis not present

## 2019-04-24 DIAGNOSIS — M9903 Segmental and somatic dysfunction of lumbar region: Secondary | ICD-10-CM | POA: Diagnosis not present

## 2019-04-28 DIAGNOSIS — M461 Sacroiliitis, not elsewhere classified: Secondary | ICD-10-CM | POA: Diagnosis not present

## 2019-04-28 DIAGNOSIS — M9904 Segmental and somatic dysfunction of sacral region: Secondary | ICD-10-CM | POA: Diagnosis not present

## 2019-04-28 DIAGNOSIS — D259 Leiomyoma of uterus, unspecified: Secondary | ICD-10-CM | POA: Diagnosis not present

## 2019-04-28 DIAGNOSIS — M9903 Segmental and somatic dysfunction of lumbar region: Secondary | ICD-10-CM | POA: Diagnosis not present

## 2019-04-29 ENCOUNTER — Ambulatory Visit (INDEPENDENT_AMBULATORY_CARE_PROVIDER_SITE_OTHER): Payer: Medicare Other | Admitting: *Deleted

## 2019-04-29 DIAGNOSIS — I495 Sick sinus syndrome: Secondary | ICD-10-CM

## 2019-05-01 LAB — CUP PACEART REMOTE DEVICE CHECK
Battery Remaining Longevity: 50 mo
Battery Voltage: 2.98 V
Brady Statistic AP VP Percent: 0.07 %
Brady Statistic AP VS Percent: 34.31 %
Brady Statistic AS VP Percent: 0.03 %
Brady Statistic AS VS Percent: 65.6 %
Brady Statistic RA Percent Paced: 34.34 %
Brady Statistic RV Percent Paced: 0.1 %
Date Time Interrogation Session: 20210407201031
Implantable Lead Implant Date: 20141208
Implantable Lead Implant Date: 20141208
Implantable Lead Location: 753859
Implantable Lead Location: 753860
Implantable Lead Model: 5076
Implantable Lead Model: 5076
Implantable Pulse Generator Implant Date: 20141208
Lead Channel Impedance Value: 361 Ohm
Lead Channel Impedance Value: 380 Ohm
Lead Channel Impedance Value: 399 Ohm
Lead Channel Impedance Value: 437 Ohm
Lead Channel Pacing Threshold Amplitude: 0.5 V
Lead Channel Pacing Threshold Amplitude: 0.625 V
Lead Channel Pacing Threshold Pulse Width: 0.4 ms
Lead Channel Pacing Threshold Pulse Width: 0.4 ms
Lead Channel Sensing Intrinsic Amplitude: 2 mV
Lead Channel Sensing Intrinsic Amplitude: 2 mV
Lead Channel Sensing Intrinsic Amplitude: 8.125 mV
Lead Channel Sensing Intrinsic Amplitude: 8.125 mV
Lead Channel Setting Pacing Amplitude: 2 V
Lead Channel Setting Pacing Amplitude: 2.5 V
Lead Channel Setting Pacing Pulse Width: 0.4 ms
Lead Channel Setting Sensing Sensitivity: 2 mV

## 2019-05-01 NOTE — Progress Notes (Signed)
PPM Remote  

## 2019-05-19 DIAGNOSIS — M9904 Segmental and somatic dysfunction of sacral region: Secondary | ICD-10-CM | POA: Diagnosis not present

## 2019-05-19 DIAGNOSIS — M9903 Segmental and somatic dysfunction of lumbar region: Secondary | ICD-10-CM | POA: Diagnosis not present

## 2019-05-19 DIAGNOSIS — M461 Sacroiliitis, not elsewhere classified: Secondary | ICD-10-CM | POA: Diagnosis not present

## 2019-05-19 DIAGNOSIS — D259 Leiomyoma of uterus, unspecified: Secondary | ICD-10-CM | POA: Diagnosis not present

## 2019-05-23 ENCOUNTER — Telehealth: Payer: Self-pay | Admitting: Family Medicine

## 2019-05-23 NOTE — Progress Notes (Signed)
  Chronic Care Management   Outreach Note  05/23/2019 Name: Nina Carpenter MRN: [id number] DOB: 01/06/1946  Referred by: [name], MD Reason for referral : No chief complaint on file.   An unsuccessful telephone outreach was attempted today. The patient was referred to the pharmacist for assistance with care management and care coordination.    This note is not being shared with the patient for the following reason: To respect privacy (The patient or proxy has requested that the information not be shared).  Follow Up Plan:   Raynicia Dukes [employer] Scheduler

## 2019-06-02 DIAGNOSIS — M9904 Segmental and somatic dysfunction of sacral region: Secondary | ICD-10-CM | POA: Diagnosis not present

## 2019-06-02 DIAGNOSIS — M461 Sacroiliitis, not elsewhere classified: Secondary | ICD-10-CM | POA: Diagnosis not present

## 2019-06-02 DIAGNOSIS — M9903 Segmental and somatic dysfunction of lumbar region: Secondary | ICD-10-CM | POA: Diagnosis not present

## 2019-06-02 DIAGNOSIS — D259 Leiomyoma of uterus, unspecified: Secondary | ICD-10-CM | POA: Diagnosis not present

## 2019-06-10 DIAGNOSIS — M1711 Unilateral primary osteoarthritis, right knee: Secondary | ICD-10-CM | POA: Diagnosis not present

## 2019-06-10 DIAGNOSIS — M25561 Pain in right knee: Secondary | ICD-10-CM | POA: Diagnosis not present

## 2019-06-13 ENCOUNTER — Telehealth: Payer: Self-pay

## 2019-06-13 NOTE — Telephone Encounter (Signed)
Pt left v/m requesting cb; pt had a question (that was entire v/m). I left v/m for pt to cb.

## 2019-06-13 NOTE — Telephone Encounter (Signed)
Pt left v/m pt went to ortho clinic on on 06/10/19 and had a cortisone shot in knee and thinks has meniscus problem. Pt was advised by ortho Dr Maxie Better that BS might run up since had cortisone. Pt wants to know if needs to increase her diabetes medication; at lunchtime today her BS was 198 which is higher than normal. Pt request cb.

## 2019-06-13 NOTE — Telephone Encounter (Signed)
Left message for Nina Carpenter that she does not need to increase any of her diabetic medication.  Per Dr. Diona Browner. She just needs to increase her water intake and call us back if her blood sugars start running >250 mg/dl.

## 2019-06-19 ENCOUNTER — Telehealth: Payer: Self-pay | Admitting: Internal Medicine

## 2019-06-19 ENCOUNTER — Other Ambulatory Visit (HOSPITAL_COMMUNITY): Payer: Self-pay | Admitting: Specialist

## 2019-06-19 ENCOUNTER — Other Ambulatory Visit: Payer: Self-pay | Admitting: Specialist

## 2019-06-19 DIAGNOSIS — M25561 Pain in right knee: Secondary | ICD-10-CM

## 2019-06-19 NOTE — Telephone Encounter (Signed)
Patient is calling to inquire about whether or not she is eligible to have an MRI due to having a pacemaker. Please advise.

## 2019-06-19 NOTE — Telephone Encounter (Signed)
Patient called and informed that her Pacemaker and leads are compatible with MRI. Advised to call back with any further questions. Verbalizes understanding.

## 2019-06-27 ENCOUNTER — Telehealth: Payer: Self-pay | Admitting: Family Medicine

## 2019-06-27 NOTE — Telephone Encounter (Signed)
Froms were received and placed in Dr. Lorelei Pont office in box to review.

## 2019-06-27 NOTE — Telephone Encounter (Signed)
The patient has been managed by cardiology for some time.  I defer cardiology management to the cardiology team.

## 2019-06-27 NOTE — Telephone Encounter (Signed)
[employer] called. They stated they will be faxing over a cardiovascular risk assessment form today and wanted to make sure the office was aware so we can watch for the paperwork

## 2019-06-29 NOTE — Progress Notes (Signed)
Nina Fabrizio T. Emnet Monk, MD, [place] at [facility] [place], [address]  Phone: [phone]  FAX: [phone]  Nina Carpenter - 74 y.o. female  MRN [id number]  Date of Birth: October 15, 1945  Date: 06/30/2019  PCP: [name], MD  Referral: [name], MD  Chief Complaint  Patient presents with  . Diabetes  . Blood Pressure Check    This visit occurred during the SARS-CoV-2 public health emergency.  Safety protocols were in place, including screening questions prior to the visit, additional usage of staff PPE, and extensive cleaning of exam room while observing appropriate contact time as indicated for disinfecting solutions.   Subjective:   Nina Carpenter is a 74 y.o. very pleasant female patient with Body mass index is 36.85 kg/m. who presents with the following:  She is a well-known patient who has poorly controlled diabetes, history of stroke, as well as high blood pressure and hyperlipidemia.  Diabetes Mellitus: Tolerating Medications: yes Compliance with diet: fair, Body mass index is 36.85 kg/m. doing a lot better off of soft drinks Exercise: minimal / intermittent Avg blood sugars at home: not checking Foot problems: none Hypoglycemia: none No nausea, vomitting, blurred vision, polyuria.  Lab Results  Component Value Date   HGBA1C 6.4 (A) 06/30/2019   HGBA1C 9.0 (A) 03/24/2019   HGBA1C 8.0 (H) 07/29/2018   Lab Results  Component Value Date   MICROALBUR 1.3 07/29/2018   LDLCALC 89 04/16/2017   CREATININE 0.99 07/29/2018    Wt Readings from Last 3 Encounters:  06/30/19 201 lb 8 oz (91.4 kg)  03/24/19 210 lb 8 oz (95.5 kg)  10/15/18 205 lb (93 kg)     HTN: Tolerating all medications without side effects Stable and at goal No CP, no sob. No HA.  119/70  BP Readings from Last 3 Encounters:  06/30/19 140/70  03/24/19 (!) 151/81  10/15/18 (!)  093/23    Basic Metabolic Panel:    Component Value Date/Time   NA 139 07/29/2018 0947   NA 139 05/09/2013 2023   K 4.7 07/29/2018 0947   K 3.9 05/09/2013 2023   CL 104 07/29/2018 0947   CL 103 05/09/2013 2023   CO2 27 07/29/2018 0947   CO2 29 05/09/2013 2023   BUN 17 07/29/2018 0947   BUN 14 05/09/2013 2023   CREATININE 0.99 07/29/2018 0947   CREATININE 0.97 05/09/2013 2023   GLUCOSE 187 (H) 07/29/2018 0947   GLUCOSE 141 (H) 05/09/2013 2023   CALCIUM 9.5 07/29/2018 0947   CALCIUM 9.5 05/09/2013 2023      06/27/2019 Last OV with [name], MD  She presents today for follow-up of multiple medical problems.   Diabetes Mellitus: Tolerating Medications: yes Compliance with diet: fair, Body mass index is 38.5 kg/m. Exercise: minimal / intermittent Avg blood sugars at home: not checking Foot problems: none Hypoglycemia: none No nausea, vomitting, blurred vision, polyuria.   Recent Labs       Lab Results  Component Value Date    HGBA1C 9.0 (A) 03/24/2019    HGBA1C 8.0 (H) 07/29/2018    HGBA1C 7.1 (A) 01/28/2018      Recent Labs       Lab Results  Component Value Date    MICROALBUR 1.3 07/29/2018    LDLCALC 89 04/16/2017    CREATININE 0.99 07/29/2018           Wt Readings from Last  3 Encounters:  03/24/19 210 lb 8 oz (95.5 kg)  10/15/18 205 lb (93 kg)  09/23/18 205 lb 12 oz (93.3 kg)    Max with Januvia and Glipizide       HTN: Tolerating all medications without side effects Stable and at goal No CP, no sob. No HA.      BP Readings from Last 3 Encounters:  03/24/19 (!) 151/81  10/15/18 (!) 144/86  09/23/18 (!) 148/75      Basic Metabolic Panel: Labs (Brief)          Component Value Date/Time    NA 139 07/29/2018 0947    NA 139 05/09/2013 2023    K 4.7 07/29/2018 0947    K 3.9 05/09/2013 2023    CL 104 07/29/2018 0947    CL 103 05/09/2013 2023    CO2 27 07/29/2018 0947    CO2 29 05/09/2013 2023    BUN 17 07/29/2018 0947    BUN 14  05/09/2013 2023    CREATININE 0.99 07/29/2018 0947    CREATININE 0.97 05/09/2013 2023    GLUCOSE 187 (H) 07/29/2018 0947    GLUCOSE 141 (H) 05/09/2013 2023    CALCIUM 9.5 07/29/2018 0947    CALCIUM 9.5 05/09/2013 2023      Lipids: Doing well, stable. Tolerating meds fine with no SE. Panel reviewed with patient.   Lipids: Labs (Brief)          Component Value Date/Time    CHOL 168 07/29/2018 0947    TRIG 205.0 (H) 07/29/2018 0947    HDL 40.30 07/29/2018 0947    LDLDIRECT 103.0 07/29/2018 0947    VLDL 41.0 (H) 07/29/2018 0947    CHOLHDL 4 07/29/2018 0947        Recent Labs       Lab Results  Component Value Date    ALT 25 07/29/2018    AST 15 07/29/2018    ALKPHOS 78 07/29/2018    BILITOT 0.6 07/29/2018      She also has a history of stroke and a history of atrial fibrillation.   Review of Systems is noted in the HPI, as appropriate  Objective:   BP 140/70   Pulse 87   Temp 98 F (36.7 C) (Temporal)   Ht 5' 2" (1.575 m)   Wt 201 lb 8 oz (91.4 kg)   SpO2 99%   BMI 36.85 kg/m   GEN: No acute distress; alert,appropriate. PULM: Breathing comfortably in no respiratory distress CV: RRR, no m/g/r  PSYCH: Normally interactive.   Laboratory and Imaging Data: Results for orders placed or performed in visit on 06/30/19  POCT glycosylated hemoglobin (Hb A1C)  Result Value Ref Range   Hemoglobin A1C 6.4 (A) 4.0 - 5.6 %   HbA1c POC (<> result, manual entry)     HbA1c, POC (prediabetic range)     HbA1c, POC (controlled diabetic range)       Assessment and Plan:     ICD-10-CM   1. Type 2 diabetes mellitus with vascular disease (HCC)  E11.59 POCT glycosylated hemoglobin (Hb A1C)  2. Essential hypertension  I10   3. Hyperlipidemia LDL goal <70  E78.5   4. Chronic anticoagulation- Eliquis  Z79.01   5. Paroxysmal atrial fibrillation (HCC)  I48.0   6. History of stroke  Z86.73    BP and DM doing much better  Some myalgia, so try lower dose of  zocor  Follow-up: Return in about 6 months (around 12/30/2019) for medicare wellness /   physical with me.  Meds ordered this encounter  Medications  . simvastatin (ZOCOR) 20 MG tablet    Sig: Take 1 tablet (20 mg total) by mouth at bedtime.    Dispense:  90 tablet    Refill:  3   Medications Discontinued During This Encounter  Medication Reason  . simvastatin (ZOCOR) 80 MG tablet    Orders Placed This Encounter  Procedures  . POCT glycosylated hemoglobin (Hb A1C)    Signed,  Spencer T. [name], MD   Outpatient Encounter Medications as of 06/30/2019  Medication Sig  . Accu-Chek FastClix Lancets MISC Use to check FBS and 2 HR PP once every 2 weeks  . Blood Glucose Calibration (ACCU-CHEK GUIDE CONTROL) LIQD 1 each by In Vitro route as directed.  . Blood Glucose Monitoring Suppl (ACCU-CHEK GUIDE ME) w/Device KIT 1 each by Does not apply route every 14 (fourteen) days. Use to check FBS and 2 HR PP once every 2 weeks  . ELIQUIS 5 MG TABS tablet TAKE 1 TABLET(5 MG) BY MOUTH TWICE DAILY  . glipiZIDE (GLUCOTROL XL) 10 MG 24 hr tablet Take 2 tablets by mouth everyday with breakfast  . glucose blood (ACCU-CHEK GUIDE) test strip Use to check FBS and 2 HR PP once every 2 weeks  . hydrochlorothiazide (HYDRODIURIL) 25 MG tablet Take 1 tablet (25 mg total) by mouth daily.  . losartan (COZAAR) 100 MG tablet Take 1 tablet (100 mg total) by mouth daily.  . metFORMIN (GLUCOPHAGE-XR) 500 MG 24 hr tablet Take 2 tablets (1,000 mg total) by mouth daily with breakfast.  . metoprolol tartrate (LOPRESSOR) 100 MG tablet Take 1 tablet (100 mg total) by mouth 2 (two) times daily.  . mometasone (NASONEX) 50 MCG/ACT nasal spray Place 2 sprays into the nose daily. (Patient taking differently: Place 2 sprays into the nose as needed. )  . montelukast (SINGULAIR) 10 MG tablet TAKE 1 TABLET BY MOUTH AT BEDTIME  . pioglitazone (ACTOS) 15 MG tablet Take 1 tablet (15 mg total) by mouth daily.  . pioglitazone (ACTOS) 30  MG tablet Take 1 tablet (30 mg total) by mouth daily.  . sitaGLIPtin (JANUVIA) 100 MG tablet TAKE 1 TABLET(100 MG) BY MOUTH DAILY  . triamcinolone cream (KENALOG) 0.1 % Apply 1 application topically 2 (two) times daily.  . [DISCONTINUED] simvastatin (ZOCOR) 80 MG tablet Take 1 tablet (80 mg total) by mouth daily. (Patient taking differently: Take 80 mg by mouth 2 (two) times a week. )  . simvastatin (ZOCOR) 20 MG tablet Take 1 tablet (20 mg total) by mouth at bedtime.   No facility-administered encounter medications on file as of 06/30/2019.    

## 2019-06-30 ENCOUNTER — Other Ambulatory Visit: Payer: Self-pay

## 2019-06-30 ENCOUNTER — Ambulatory Visit (INDEPENDENT_AMBULATORY_CARE_PROVIDER_SITE_OTHER): Payer: Medicare Other | Admitting: Family Medicine

## 2019-06-30 ENCOUNTER — Encounter: Payer: Self-pay | Admitting: Family Medicine

## 2019-06-30 VITALS — BP 140/70 | HR 87 | Temp 98.0°F | Ht 62.0 in | Wt 201.5 lb

## 2019-06-30 DIAGNOSIS — Z8673 Personal history of transient ischemic attack (TIA), and cerebral infarction without residual deficits: Secondary | ICD-10-CM | POA: Diagnosis not present

## 2019-06-30 DIAGNOSIS — E785 Hyperlipidemia, unspecified: Secondary | ICD-10-CM | POA: Diagnosis not present

## 2019-06-30 DIAGNOSIS — I10 Essential (primary) hypertension: Secondary | ICD-10-CM | POA: Diagnosis not present

## 2019-06-30 DIAGNOSIS — E1159 Type 2 diabetes mellitus with other circulatory complications: Secondary | ICD-10-CM | POA: Diagnosis not present

## 2019-06-30 DIAGNOSIS — Z7901 Long term (current) use of anticoagulants: Secondary | ICD-10-CM | POA: Diagnosis not present

## 2019-06-30 DIAGNOSIS — I480 Paroxysmal atrial fibrillation: Secondary | ICD-10-CM

## 2019-06-30 LAB — POCT GLYCOSYLATED HEMOGLOBIN (HGB A1C): Hemoglobin A1C: 6.4 % — AB (ref 4.0–5.6)

## 2019-06-30 MED ORDER — SIMVASTATIN 20 MG PO TABS
20.0000 mg | ORAL_TABLET | Freq: Every day | ORAL | 3 refills | Status: DC
Start: 2019-06-30 — End: 2020-01-05

## 2019-07-09 ENCOUNTER — Other Ambulatory Visit: Payer: Self-pay

## 2019-07-09 ENCOUNTER — Ambulatory Visit (HOSPITAL_COMMUNITY)
Admission: RE | Admit: 2019-07-09 | Discharge: 2019-07-09 | Disposition: A | Discharge status: Home / Self Care | Payer: Medicare Other | Source: Ambulatory Visit | Attending: Specialist | Admitting: Specialist

## 2019-07-09 DIAGNOSIS — M25561 Pain in right knee: Secondary | ICD-10-CM | POA: Diagnosis not present

## 2019-07-10 ENCOUNTER — Encounter: Payer: Self-pay | Admitting: Obstetrics and Gynecology

## 2019-07-10 ENCOUNTER — Ambulatory Visit (INDEPENDENT_AMBULATORY_CARE_PROVIDER_SITE_OTHER): Payer: Medicare Other | Admitting: Obstetrics and Gynecology

## 2019-07-10 ENCOUNTER — Other Ambulatory Visit (HOSPITAL_COMMUNITY)
Admission: RE | Admit: 2019-07-10 | Discharge: 2019-07-10 | Disposition: A | Discharge status: Home / Self Care | Payer: Medicare Other | Source: Ambulatory Visit | Attending: Obstetrics and Gynecology | Admitting: Obstetrics and Gynecology

## 2019-07-10 VITALS — BP 120/84 | Ht 63.0 in | Wt 200.0 lb

## 2019-07-10 DIAGNOSIS — N939 Abnormal uterine and vaginal bleeding, unspecified: Secondary | ICD-10-CM

## 2019-07-10 DIAGNOSIS — R300 Dysuria: Secondary | ICD-10-CM | POA: Diagnosis not present

## 2019-07-10 DIAGNOSIS — Z124 Encounter for screening for malignant neoplasm of cervix: Secondary | ICD-10-CM

## 2019-07-10 DIAGNOSIS — N950 Postmenopausal bleeding: Secondary | ICD-10-CM | POA: Diagnosis present

## 2019-07-10 DIAGNOSIS — N898 Other specified noninflammatory disorders of vagina: Secondary | ICD-10-CM

## 2019-07-10 DIAGNOSIS — Z1151 Encounter for screening for human papillomavirus (HPV): Secondary | ICD-10-CM | POA: Diagnosis not present

## 2019-07-10 LAB — POCT WET PREP WITH KOH
Clue Cells Wet Prep HPF POC: NEGATIVE
KOH Prep POC: NEGATIVE
Trichomonas, UA: NEGATIVE
Yeast Wet Prep HPF POC: NEGATIVE

## 2019-07-10 LAB — POCT URINALYSIS DIPSTICK
Bilirubin, UA: NEGATIVE
Blood, UA: NEGATIVE
Glucose, UA: NEGATIVE
Ketones, UA: NEGATIVE
Leukocytes, UA: NEGATIVE
Nitrite, UA: NEGATIVE
Protein, UA: NEGATIVE
Spec Grav, UA: 1.025 (ref 1.010–1.025)
pH, UA: 5 (ref 5.0–8.0)

## 2019-07-10 MED ORDER — FLUCONAZOLE 150 MG PO TABS: 150.0000 mg | ORAL_TABLET | Freq: Once | ORAL | 0 refills | Status: AC

## 2019-07-10 NOTE — Patient Instructions (Signed)
I value your feedback and entrusting us with your care. If you get a [place] patient survey, I would appreciate you taking the time to let us know about your experience today. Thank you!  As of January 02, 2019, your lab results will be released to your MyChart immediately, before I even have a chance to see them. Please give me time to review them and contact you if there are any abnormalities. Thank you for your patience.  

## 2019-07-10 NOTE — Progress Notes (Signed)
[name], MD   Chief Complaint  Patient presents with   Vaginal Discharge    at times itching and irritation, no odor x 3 weeks    HPI:      [name] is a 74 y.o. No obstetric history on file. whose LMP was No LMP recorded. Patient is postmenopausal., presents today for NP eval of pink vaginal d/c, vaginal irritation, and occas dysuria that started about 3 wks ago. Has had a few episodes of scant amt of pinkish blood with wiping only. No blood in toilet or on underwear. No known hematuria. No sx in past week or today. Hx of kidney stones in past. No pelvic/back pain. Pt denies any increased white vag d/c, fishy odor, but has had some vaginal itching and dysuria. Hx of controlled DM. No recent abx use, no new soaps/detergents. No meds to treat. No urinary frequency, urgency.   She is sex active with husband of 44 yrs, no vag dryness if uses lubricants. No postcoital bleeding. No new partners.  She has not had a recent pap smear. Hx of abn pap with repeat in her 34s.   No FH of breast/ovar cancer/uterine cancer.   Past Medical History:  Diagnosis Date   Acid reflux disease    Allergy    Asthma    a. Remotely.   Chronic combined systolic and diastolic CHF (congestive heart failure) ([place])    a. Felt related to AF. EF 45-50% by echo 12/2012.   CVA (cerebral vascular accident) ([place])    a. Thromboemoblic L MCA CVA 67/6720 with no residual deficit except mild occasional word finding. b. 9-47% RICA/LICA by dopplers 09/29/26.   GERD (gastroesophageal reflux disease)    H/O medication noncompliance    a. Per PCP note 01/06/13: Previously refused anything greater than aspirin, refused treatment for DM, and often stopped hypertensive meds and lipid meds.   Hx of rheumatic fever    a. As a child.   Hyperlipidemia    Hypertension    Kidney stones    a. Remotely.   Obesity    PAF (paroxysmal atrial fibrillation) ([place])    a. Dx 09/2012, chronicity unclear  at that time. Initially refused Xarelto due to risk of bleeding. b. Sustained CVA 12/2012 - agreeable to taking Eliquis. Tachybrady syndrome during that admission s/p Medtronic PPM implantation;  c. Failed flecainide, sotalol, amiodarone (itching), and propafenone.   Tachy-brady syndrome ([place])    a. AF RVR with pauses >5sec requiring Medtronic PPM 12/2012.   Tubular adenoma of colon 05/2008   Type 2 diabetes mellitus ([place])     Past Surgical History:  Procedure Laterality Date   CHOLECYSTECTOMY     DESTRUCTION TRIGEMINAL NERVE VIA NEUROLYTIC AGENT  NOv @013    Gamma Knife    ENDOMETRIAL ABLATION     INSERT / REPLACE / REMOVE PACEMAKER     PACEMAKER INSERTION  12-30-12   MDT dual chamber pacemaker implanted by Dr Caryl Comes for tachy-brady syndrome   PERMANENT PACEMAKER INSERTION N/A 12/30/2012   Procedure: PERMANENT PACEMAKER INSERTION;  Surgeon: Deboraha Sprang, MD;  Location: [facility] CATH LAB;  Service: Cardiovascular;  Laterality: N/A;   TUBAL LIGATION     ectopic preganacy     Family History  Problem Relation Age of Onset   Stroke Mother    Diabetes Mother    Hypertension Mother     Social History   Socioeconomic History   Marital status: Married    Spouse name: Not  on file   Number of children: 2   Years of education: Not on file   Highest education level: Not on file  Occupational History   Occupation: cosmetologist    Employer: SELF EMPLOYED  Tobacco Use   Smoking status: Former Smoker    Packs/day: 2.00    Types: Cigarettes    Quit date: 01/24/1968    Years since quitting: 51.4   Smokeless tobacco: Never Used  Vaping Use   Vaping Use: Never used  Substance and Sexual Activity   Alcohol use: Yes    Alcohol/week: 1.0 standard drink    Types: 1 Glasses of wine per week    Comment: rare use   Drug use: No   Sexual activity: Yes    Birth control/protection: Post-menopausal  Other Topics Concern   Not on file  Social History Narrative   Lives in  [place] with her husband.  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   Social Determinants of Health   Financial Resource Strain:    Difficulty of Paying Living Expenses:   Food Insecurity:    Worried About Charity fundraiser in the Last Year:    Arboriculturist in the Last Year:   Transportation Needs:    Film/video editor (Medical):    Lack of Transportation (Non-Medical):   Physical Activity:    Days of Exercise per Week:    Minutes of Exercise per Session:   Stress:    Feeling of Stress :   Social Connections:    Frequency of Communication with Friends and Family:    Frequency of Social Gatherings with Friends and Family:    Attends Religious Services:    Active Member of Clubs or Organizations:    Attends Music therapist:    Marital Status:   Intimate Partner Violence:    Fear of Current or Ex-Partner:    Emotionally Abused:    Physically Abused:    Sexually Abused:     Outpatient Medications Prior to Visit  Medication Sig Dispense Refill   Accu-Chek FastClix Lancets MISC Use to check FBS and 2 HR PP once every 2 weeks 102 each 0   Blood Glucose Calibration (ACCU-CHEK GUIDE CONTROL) LIQD 1 each by In Vitro route as directed. 1 each 0   Blood Glucose Monitoring Suppl (ACCU-CHEK GUIDE ME) w/Device KIT 1 each by Does not apply route every 14 (fourteen) days. Use to check FBS and 2 HR PP once every 2 weeks 1 kit 0   ELIQUIS 5 MG TABS tablet TAKE 1 TABLET(5 MG) BY MOUTH TWICE DAILY 60 tablet 5   glipiZIDE (GLUCOTROL XL) 10 MG 24 hr tablet Take 2 tablets by mouth everyday with breakfast 180 tablet 1   glucose blood (ACCU-CHEK GUIDE) test strip Use to check FBS and 2 HR PP once every 2 weeks 50 each 1   hydrochlorothiazide (HYDRODIURIL) 25 MG tablet Take 1 tablet (25 mg total) by mouth daily. 30 tablet 5   losartan (COZAAR) 100 MG tablet Take 1 tablet (100 mg total) by mouth daily. 90 tablet 1   metFORMIN (GLUCOPHAGE-XR) 500 MG  24 hr tablet Take 2 tablets (1,000 mg total) by mouth daily with breakfast. 60 tablet 5   metoprolol tartrate (LOPRESSOR) 100 MG tablet Take 1 tablet (100 mg total) by mouth 2 (two) times daily. 180 tablet 1   mometasone (NASONEX) 50 MCG/ACT nasal spray Place 2 sprays into the nose daily. (Patient taking differently: Place 2 sprays  into the nose as needed. ) 17 g 2   montelukast (SINGULAIR) 10 MG tablet TAKE 1 TABLET BY MOUTH AT BEDTIME 90 tablet 3   pioglitazone (ACTOS) 15 MG tablet Take 1 tablet (15 mg total) by mouth daily. 30 tablet 0   pioglitazone (ACTOS) 30 MG tablet Take 1 tablet (30 mg total) by mouth daily. 30 tablet 5   simvastatin (ZOCOR) 20 MG tablet Take 1 tablet (20 mg total) by mouth at bedtime. 90 tablet 3   sitaGLIPtin (JANUVIA) 100 MG tablet TAKE 1 TABLET(100 MG) BY MOUTH DAILY 90 tablet 1   triamcinolone cream (KENALOG) 0.1 % Apply 1 application topically 2 (two) times daily. 454 g 0   No facility-administered medications prior to visit.      ROS:  Review of Systems  Constitutional: Negative for fatigue, fever and unexpected weight change.  Respiratory: Negative for cough, shortness of breath and wheezing.   Cardiovascular: Negative for chest pain, palpitations and leg swelling.  Gastrointestinal: Negative for blood in stool, constipation, diarrhea, nausea and vomiting.  Endocrine: Negative for cold intolerance, heat intolerance and polyuria.  Genitourinary: Positive for dysuria, vaginal bleeding and vaginal discharge. Negative for dyspareunia, flank pain, frequency, genital sores, hematuria, menstrual problem, pelvic pain, urgency and vaginal pain.  Musculoskeletal: Negative for back pain, joint swelling and myalgias.  Skin: Negative for rash.  Neurological: Negative for dizziness, syncope, light-headedness, numbness and headaches.  Hematological: Negative for adenopathy.  Psychiatric/Behavioral: Negative for agitation, confusion, sleep disturbance and  suicidal ideas. The patient is not nervous/anxious.     OBJECTIVE:   Vitals:  BP 120/84    Ht 5' 3"  (1.6 m)    Wt 200 lb (90.7 kg)    BMI 35.43 kg/m   Physical Exam Vitals reviewed.  Constitutional:      Appearance: She is well-developed.  Pulmonary:     Effort: Pulmonary effort is normal.  Genitourinary:    General: Normal vulva.     Pubic Area: No rash.      Labia:        Right: No rash, tenderness or lesion.        Left: No rash, tenderness or lesion.      Vagina: Normal. No vaginal discharge, erythema or tenderness.     Cervix: Normal.     Uterus: Normal. Not enlarged and not tender.      Adnexa: Right adnexa normal and left adnexa normal.       Right: No mass or tenderness.         Left: No mass or tenderness.       Comments: NO EVID OF BLOOD ON VAG EXAM; NO ERYTHEMA, WHITE D/C Musculoskeletal:        General: Normal range of motion.     Cervical back: Normal range of motion.  Skin:    General: Skin is warm and dry.  Neurological:     General: No focal deficit present.     Mental Status: She is alert and oriented to person, place, and time.  Psychiatric:        Mood and Affect: Mood normal.        Behavior: Behavior normal.        Thought Content: Thought content normal.        Judgment: Judgment normal.     Results: Results for orders placed or performed in visit on 07/10/19 (from the past 24 hour(s))  POCT Wet Prep with KOH     Status: Normal   Collection  Time: 07/10/19  3:26 PM  Result Value Ref Range   Trichomonas, UA Negative    Clue Cells Wet Prep HPF POC neg    Epithelial Wet Prep HPF POC     Yeast Wet Prep HPF POC neg    Bacteria Wet Prep HPF POC     RBC Wet Prep HPF POC     WBC Wet Prep HPF POC     KOH Prep POC Negative Negative  POCT Urinalysis Dipstick     Status: Normal   Collection Time: 07/10/19  4:43 PM  Result Value Ref Range   Color, UA YELLOW    Clarity, UA clear    Glucose, UA Negative Negative   Bilirubin, UA neg    Ketones, UA  neg    Spec Grav, UA 1.025 1.010 - 1.025   Blood, UA neg    pH, UA 5.0 5.0 - 8.0   Protein, UA Negative Negative   Urobilinogen, UA     Nitrite, UA neg    Leukocytes, UA Negative Negative   Appearance     Odor     NOT ENOUGH URINE TO SEND OFF FOR UA OR C&S  Assessment/Plan: Vaginal itching - Plan: POCT Wet Prep with KOH, fluconazole (DIFLUCAN) 150 MG tablet; Neg wet prep/exam. Treat empirically for yeast given hx of DM. Rx diflucan, f/u prn.   Dysuria - Plan: POCT Urinalysis Dipstick; neg UA. Not enough urine to send for C&S/UA.  Could be related to vaginitis.   Vaginal bleeding - Plan: Cytology - PAP; Neg exam, no sx for past wk. Discussed etiology of uterus, cx, bladder, vagina. Rule out cx with pap and treat for possible vaginitis. Will f/u with pap results. If neg, pt to RTO for repeat eval if sx recur. Would rechk urine and send for UA/C&S (not enough specimen today) and check GYN u/s. Pt understands.   Cervical cancer screening - Plan: Cytology - PAP; will call pt with results and f/u.   Meds ordered this encounter  Medications   fluconazole (DIFLUCAN) 150 MG tablet    Sig: Take 1 tablet (150 mg total) by mouth once for 1 dose.    Dispense:  1 tablet    Refill:  0    Order Specific Question:   Supervising Provider    Answer:   Gae Dry [[id number]]      Return if symptoms worsen or fail to improve.  Eleno Weimar B. Marg Macmaster, PA-C 07/10/2019 4:48 PM

## 2019-07-11 ENCOUNTER — Telehealth: Payer: Self-pay | Admitting: Internal Medicine

## 2019-07-11 NOTE — Telephone Encounter (Signed)
I have received the form x 2. Dr. Caryl Comes has been out of the office for 2 weeks- waiting to review with MD.

## 2019-07-11 NOTE — Telephone Encounter (Signed)
[employer] calling to confirm risk assessment form received.  Unable to clarify. Per request sending to clinical to review and reply

## 2019-07-14 ENCOUNTER — Telehealth: Payer: Self-pay

## 2019-07-14 LAB — CYTOLOGY - PAP: Diagnosis: NEGATIVE

## 2019-07-14 NOTE — Telephone Encounter (Signed)
Pt returning call re pap results.  [phone]  Mailbox full.

## 2019-07-15 NOTE — Telephone Encounter (Signed)
Per Epic pt has seen [facility]'s comments on pap smear.

## 2019-07-21 NOTE — Telephone Encounter (Signed)
[employer] is calling back to check status of risk assessment form.  Form should be faxed to [phone]

## 2019-07-29 ENCOUNTER — Ambulatory Visit (INDEPENDENT_AMBULATORY_CARE_PROVIDER_SITE_OTHER): Payer: Medicare Other | Admitting: *Deleted

## 2019-07-29 DIAGNOSIS — I495 Sick sinus syndrome: Secondary | ICD-10-CM

## 2019-07-31 ENCOUNTER — Telehealth: Payer: Self-pay | Admitting: Internal Medicine

## 2019-07-31 LAB — CUP PACEART REMOTE DEVICE CHECK
Battery Remaining Longevity: 43 mo
Battery Voltage: 2.97 V
Brady Statistic AP VP Percent: 0.14 %
Brady Statistic AP VS Percent: 53.3 %
Brady Statistic AS VP Percent: 0.03 %
Brady Statistic AS VS Percent: 46.53 %
Brady Statistic RA Percent Paced: 53.24 %
Brady Statistic RV Percent Paced: 0.17 %
Date Time Interrogation Session: 20210707225823
Implantable Lead Implant Date: 20141208
Implantable Lead Implant Date: 20141208
Implantable Lead Location: 753859
Implantable Lead Location: 753860
Implantable Lead Model: 5076
Implantable Lead Model: 5076
Implantable Pulse Generator Implant Date: 20141208
Lead Channel Impedance Value: 323 Ohm
Lead Channel Impedance Value: 342 Ohm
Lead Channel Impedance Value: 361 Ohm
Lead Channel Impedance Value: 399 Ohm
Lead Channel Pacing Threshold Amplitude: 0.625 V
Lead Channel Pacing Threshold Amplitude: 0.75 V
Lead Channel Pacing Threshold Pulse Width: 0.4 ms
Lead Channel Pacing Threshold Pulse Width: 0.4 ms
Lead Channel Sensing Intrinsic Amplitude: 1.625 mV
Lead Channel Sensing Intrinsic Amplitude: 1.625 mV
Lead Channel Sensing Intrinsic Amplitude: 6 mV
Lead Channel Sensing Intrinsic Amplitude: 6 mV
Lead Channel Setting Pacing Amplitude: 2 V
Lead Channel Setting Pacing Amplitude: 2.5 V
Lead Channel Setting Pacing Pulse Width: 0.4 ms
Lead Channel Setting Sensing Sensitivity: 2 mV

## 2019-07-31 NOTE — Telephone Encounter (Signed)
Patient informed that she has not missed a remote. Her 07/30/19 remote transmission was received and there no new findings and the device function was WNL. Expressed concerns that MRI done on her knee may have caused issue with device. Reassured device function was not affected by test.

## 2019-07-31 NOTE — Telephone Encounter (Signed)
Dr. Caryl Comes has been reviewing the order form received from [facility] for genetic testing for the patient.  He is hesistent to complete this as he is unfamiliar with this company and is concerned about what type of follow up they have with the patient if testing comes back positive.  I spoke with a [employer] rep (Tiffany) yesterday and advised her of the above MD concerns. She advised she would fax a information pamphlet to [name] to review.  Today Dr. Caryl Comes advised that the patient has a recall set for September, but he would like to go ahead and move up her appointment to discuss this with her in person.   Will forward to scheduling to please reach out to the patient to set her up for a follow up appointment with Dr. Caryl Comes.

## 2019-07-31 NOTE — Telephone Encounter (Signed)
Patient calling  Patient believes she missed her last home device check and wants to reschedule  Please call to discuss cv

## 2019-07-31 NOTE — Progress Notes (Signed)
Remote pacemaker transmission.   

## 2019-07-31 NOTE — Telephone Encounter (Signed)
Scheduled

## 2019-08-01 NOTE — Telephone Encounter (Signed)
Noted  

## 2019-08-12 ENCOUNTER — Ambulatory Visit (INDEPENDENT_AMBULATORY_CARE_PROVIDER_SITE_OTHER): Payer: Medicare Other | Admitting: Internal Medicine

## 2019-08-12 ENCOUNTER — Encounter: Payer: Self-pay | Admitting: Internal Medicine

## 2019-08-12 ENCOUNTER — Other Ambulatory Visit: Payer: Self-pay

## 2019-08-12 VITALS — BP 150/72 | HR 66 | Ht 62.0 in | Wt 201.0 lb

## 2019-08-12 DIAGNOSIS — Z950 Presence of cardiac pacemaker: Secondary | ICD-10-CM | POA: Diagnosis not present

## 2019-08-12 DIAGNOSIS — I480 Paroxysmal atrial fibrillation: Secondary | ICD-10-CM | POA: Diagnosis not present

## 2019-08-12 DIAGNOSIS — I495 Sick sinus syndrome: Secondary | ICD-10-CM | POA: Diagnosis not present

## 2019-08-12 DIAGNOSIS — I5032 Chronic diastolic (congestive) heart failure: Secondary | ICD-10-CM | POA: Diagnosis not present

## 2019-08-12 DIAGNOSIS — Z79899 Other long term (current) drug therapy: Secondary | ICD-10-CM | POA: Diagnosis not present

## 2019-08-12 NOTE — Progress Notes (Signed)
Patient Care Team: [name], MD as PCP - General   HPI  Nina Carpenter is a 74 y.o. female Seen in follow-up for atrial fibrillation and tachybradysyndrome for which she underwent pacing 2014. PVI at [facility] 2015 There is been a marked diminution in atrial fibrillation symptoms and by device interrogation burden as well. She does have some nonsustained atrial tachycardia/slow flutter    Thromboembolic risk factors ( age -54, HTN-1, TIA/CVA-2, DM-1, Gender-1) for a CHADSVASc Score of 6  The patient denies chest pain, shortness of breath, nocturnal dyspnea, orthopnea  There have been no palpitations, lightheadedness or syncope.  Some peripheral edema at the end of the day.  Is here also to discuss a request taking to [name] seeking medical necessity for cardiac genetic testing.  She is unaware of the source of this request    DATE TEST EF   2/14 Echo   45-50 %           Date Cr K Hgb A1c  3/19 0.83 4.6 14.6   7/20 0.99 4.7 14.3 8.0  7/21    6.4     Past Medical History:  Diagnosis Date  . Acid reflux disease   . Allergy   . Asthma    a. Remotely.  . Chronic combined systolic and diastolic CHF (congestive heart failure) ([place])    a. Felt related to AF. EF 45-50% by echo 12/2012.  Marland Kitchen CVA (cerebral vascular accident) ([place])    a. Thromboemoblic L MCA CVA 15/0569 with no residual deficit except mild occasional word finding. b. 7-94% RICA/LICA by dopplers 80/1/65.  Marland Kitchen GERD (gastroesophageal reflux disease)   . H/O medication noncompliance    a. Per PCP note 01/06/13: Previously refused anything greater than aspirin, refused treatment for DM, and often stopped hypertensive meds and lipid meds.  Marland Kitchen Hx of rheumatic fever    a. As a child.  . Hyperlipidemia   . Hypertension   . Kidney stones    a. Remotely.  . Obesity   . PAF (paroxysmal atrial fibrillation) ([place])    a. Dx 09/2012, chronicity unclear at that time. Initially refused Xarelto due to risk of bleeding. b.  Sustained CVA 12/2012 - agreeable to taking Eliquis. Tachybrady syndrome during that admission s/p Medtronic PPM implantation;  c. Failed flecainide, sotalol, amiodarone (itching), and propafenone.  . Tachy-brady syndrome ([place])    a. AF RVR with pauses >5sec requiring Medtronic PPM 12/2012.  . Tubular adenoma of colon 05/2008  . Type 2 diabetes mellitus ([place])     Past Surgical History:  Procedure Laterality Date  . CHOLECYSTECTOMY    . DESTRUCTION TRIGEMINAL NERVE VIA NEUROLYTIC AGENT  NOv @013    Gamma Knife   . ENDOMETRIAL ABLATION    . INSERT / REPLACE / REMOVE PACEMAKER    . PACEMAKER INSERTION  12-30-12   MDT dual chamber pacemaker implanted by Dr Caryl Comes for tachy-brady syndrome  . PERMANENT PACEMAKER INSERTION N/A 12/30/2012   Procedure: PERMANENT PACEMAKER INSERTION;  Surgeon: Deboraha Sprang, MD;  Location: [facility] CATH LAB;  Service: Cardiovascular;  Laterality: N/A;  . TUBAL LIGATION     ectopic preganacy     Current Outpatient Medications  Medication Sig Dispense Refill  . Accu-Chek FastClix Lancets MISC Use to check FBS and 2 HR PP once every 2 weeks 102 each 0  . Blood Glucose Calibration (ACCU-CHEK GUIDE CONTROL) LIQD 1 each by In Vitro route as directed. 1 each 0  . Blood  Glucose Monitoring Suppl (ACCU-CHEK GUIDE ME) w/Device KIT 1 each by Does not apply route every 14 (fourteen) days. Use to check FBS and 2 HR PP once every 2 weeks 1 kit 0  . ELIQUIS 5 MG TABS tablet TAKE 1 TABLET(5 MG) BY MOUTH TWICE DAILY 60 tablet 5  . glipiZIDE (GLUCOTROL XL) 10 MG 24 hr tablet Take 2 tablets by mouth everyday with breakfast 180 tablet 1  . glucose blood (ACCU-CHEK GUIDE) test strip Use to check FBS and 2 HR PP once every 2 weeks 50 each 1  . hydrochlorothiazide (HYDRODIURIL) 25 MG tablet Take 1 tablet (25 mg total) by mouth daily. 30 tablet 5  . losartan (COZAAR) 100 MG tablet Take 1 tablet (100 mg total) by mouth daily. 90 tablet 1  . metFORMIN (GLUCOPHAGE-XR) 500 MG 24 hr tablet Take 2  tablets (1,000 mg total) by mouth daily with breakfast. 60 tablet 5  . metoprolol tartrate (LOPRESSOR) 100 MG tablet Take 1 tablet (100 mg total) by mouth 2 (two) times daily. 180 tablet 1  . mometasone (NASONEX) 50 MCG/ACT nasal spray Place 2 sprays into the nose daily. (Patient taking differently: Place 2 sprays into the nose as needed. ) 17 g 2  . montelukast (SINGULAIR) 10 MG tablet TAKE 1 TABLET BY MOUTH AT BEDTIME 90 tablet 3  . pioglitazone (ACTOS) 30 MG tablet Take 1 tablet (30 mg total) by mouth daily. 30 tablet 5  . simvastatin (ZOCOR) 20 MG tablet Take 1 tablet (20 mg total) by mouth at bedtime. 90 tablet 3  . sitaGLIPtin (JANUVIA) 100 MG tablet TAKE 1 TABLET(100 MG) BY MOUTH DAILY 90 tablet 1  . triamcinolone cream (KENALOG) 0.1 % Apply 1 application topically 2 (two) times daily. 454 g 0   No current facility-administered medications for this visit.    Allergies  Allergen Reactions  . Bee Venom Anaphylaxis  . Amiodarone Itching  . Crestor [Rosuvastatin]     myalgia  . Fish Allergy Swelling    Shell fish  . Iodine Itching    itching  . Lipitor [Atorvastatin]     myalgias  . Penicillins Hives and Itching  . Ace Inhibitors Cough  . Lidocaine Hcl Palpitations    ALL CAINES given during dental procedures    Review of Systems negative except from HPI and PMH  Physical Exam  BP (!) 150/72 (BP Location: Left Arm, Patient Position: Sitting, Cuff Size: Normal)   Pulse 66   Ht 5' 2"  (1.575 m)   Wt 201 lb (91.2 kg)   BMI 36.76 kg/m  Well developed and well nourished in no acute distress HENT normal Neck supple with JVP-flat Clear Device pocket well healed; without hematoma or erythema.  There is no tethering  Regular rate and rhythm, no * murmur Abd-soft with active BS No Clubbing cyanosis left greater than right 1+ versus trace edema Skin-warm and dry A & Oriented  Grossly normal sensory and motor function  ECG atrial pacing at 66 Interval 16/08/43  Assessment  and  Plan  Atrial fibrillation-paroxysmal status post PVI [facility] 4/15  Hypertension  Hyperlipidemia  Sinus node dysfunction  Diabetes.  LV dysfunction-mild  Heart failure-systolic/diastolic  Pacemaker Medtronic     Euvolemic continue current meds  On Anticoagulation;  No bleeding issues   Infrequent atrial fibrillation; some atrial tachycardia  Blood pressure is elevated.  However, she is working on losing weight so we will hold off on further therapy.  Device function normal.  Hemoglobin A1c 9--6.4.  Encouraged weight loss.  With left ventricular dysfunction a few years ago, will recheck as there are no new indications for therapy and patients with intermediate left ventricular dysfunction

## 2019-08-12 NOTE — Patient Instructions (Addendum)
Medication Instructions:  - Your physician recommends that you continue on your current medications as directed. Please refer to the Current Medication list given to you today.  *If you need a refill on your cardiac medications before your next appointment, please call your pharmacy*   Lab Work: - Your physician recommends that you have lab work today: BMP/ CBC  If you have labs (blood work) drawn today and your tests are completely normal, you will receive your results only by: Marland Kitchen MyChart Message (if you have MyChart) OR . A paper copy in the mail If you have any lab test that is abnormal or we need to change your treatment, we will call you to review the results.   Testing/Procedures: - Your physician has requested that you have an echocardiogram. Echocardiography is a painless test that uses sound waves to create images of your heart. It provides your doctor with information about the size and shape of your heart and how well your heart's chambers and valves are working. This procedure takes approximately one hour. There are no restrictions for this procedure.   Follow-Up: At [facility], you and your health needs are our priority.  As part of our continuing mission to provide you with exceptional heart care, we have created designated Provider Care Teams.  These Care Teams include your primary Cardiologist (physician) and Advanced Practice Providers (APPs -  Physician Assistants and Nurse Practitioners) who all work together to provide you with the care you need, when you need it.  We recommend signing up for the patient portal called "MyChart".  Sign up information is provided on this After Visit Summary.  MyChart is used to connect with patients for Virtual Visits (Telemedicine).  Patients are able to view lab/test results, encounter notes, upcoming appointments, etc.  Non-urgent messages can be sent to your provider as well.   To learn more about what you can do with MyChart, go to  [email or web address].    Your next appointment:   1 year(s)  The format for your next appointment:   In Person  Provider:   Virl Axe, MD   Other Instructions n/a    Echocardiogram An echocardiogram is a procedure that uses painless sound waves (ultrasound) to produce an image of the heart. Images from an echocardiogram can provide important information about:  Signs of coronary artery disease (CAD).  Aneurysm detection. An aneurysm is a weak or damaged part of an artery wall that bulges out from the normal force of blood pumping through the body.  Heart size and shape. Changes in the size or shape of the heart can be associated with certain conditions, including heart failure, aneurysm, and CAD.  Heart muscle function.  Heart valve function.  Signs of a past heart attack.  Fluid buildup around the heart.  Thickening of the heart muscle.  A tumor or infectious growth around the heart valves. Tell a health care provider about:  Any allergies you have.  All medicines you are taking, including vitamins, herbs, eye drops, creams, and over-the-counter medicines.  Any blood disorders you have.  Any surgeries you have had.  Any medical conditions you have.  Whether you are pregnant or may be pregnant. What are the risks? Generally, this is a safe procedure. However, problems may occur, including:  Allergic reaction to dye (contrast) that may be used during the procedure. What happens before the procedure? No specific preparation is needed. You may eat and drink normally. What happens during the procedure?   An  IV tube may be inserted into one of your veins.  You may receive contrast through this tube. A contrast is an injection that improves the quality of the pictures from your heart.  A gel will be applied to your chest.  A wand-like tool (transducer) will be moved over your chest. The gel will help to transmit the sound waves from the  transducer.  The sound waves will harmlessly bounce off of your heart to allow the heart images to be captured in real-time motion. The images will be recorded on a computer. The procedure may vary among health care providers and hospitals. What happens after the procedure?  You may return to your normal, everyday life, including diet, activities, and medicines, unless your health care provider tells you not to do that. Summary  An echocardiogram is a procedure that uses painless sound waves (ultrasound) to produce an image of the heart.  Images from an echocardiogram can provide important information about the size and shape of your heart, heart muscle function, heart valve function, and fluid buildup around your heart.  You do not need to do anything to prepare before this procedure. You may eat and drink normally.  After the echocardiogram is completed, you may return to your normal, everyday life, unless your health care provider tells you not to do that. This information is not intended to replace advice given to you by your health care provider. Make sure you discuss any questions you have with your health care provider. Document Revised: 05/02/2018 Document Reviewed: 02/12/2016 Elsevier Patient Education  2020 Elsevier Inc.   

## 2019-08-15 LAB — CBC WITH DIFFERENTIAL/PLATELET
Basophils Absolute: 0.1 10*3/uL (ref 0.0–0.2)
Basos: 1 %
EOS (ABSOLUTE): 0.1 10*3/uL (ref 0.0–0.4)
Eos: 2 %
Hematocrit: 42.9 % (ref 34.0–46.6)
Hemoglobin: 13.9 g/dL (ref 11.1–15.9)
Immature Grans (Abs): 0.1 10*3/uL (ref 0.0–0.1)
Immature Granulocytes: 1 %
Lymphocytes Absolute: 1.4 10*3/uL (ref 0.7–3.1)
Lymphs: 28 %
MCH: 30.8 pg (ref 26.6–33.0)
MCHC: 32.4 g/dL (ref 31.5–35.7)
MCV: 95 fL (ref 79–97)
Monocytes Absolute: 0.4 10*3/uL (ref 0.1–0.9)
Monocytes: 9 %
Neutrophils Absolute: 2.9 10*3/uL (ref 1.4–7.0)
Neutrophils: 59 %
Platelets: 196 10*3/uL (ref 150–450)
RBC: 4.52 x10E6/uL (ref 3.77–5.28)
RDW: 13.3 % (ref 11.7–15.4)
WBC: 4.9 10*3/uL (ref 3.4–10.8)

## 2019-08-15 LAB — BASIC METABOLIC PANEL
BUN/Creatinine Ratio: 19 (ref 12–28)
BUN: 22 mg/dL (ref 8–27)
Creatinine, Ser: 1.14 mg/dL — ABNORMAL HIGH (ref 0.57–1.00)
GFR calc Af Amer: 55 mL/min/{1.73_m2} — ABNORMAL LOW (ref >59)
GFR calc non Af Amer: 47 mL/min/{1.73_m2} — ABNORMAL LOW (ref >59)

## 2019-08-27 ENCOUNTER — Other Ambulatory Visit: Payer: Self-pay | Admitting: Family Medicine

## 2019-09-15 ENCOUNTER — Ambulatory Visit (INDEPENDENT_AMBULATORY_CARE_PROVIDER_SITE_OTHER): Payer: Medicare Other

## 2019-09-15 ENCOUNTER — Other Ambulatory Visit: Payer: Self-pay

## 2019-09-15 DIAGNOSIS — I5032 Chronic diastolic (congestive) heart failure: Secondary | ICD-10-CM

## 2019-09-15 LAB — ECHOCARDIOGRAM COMPLETE
Area-P 1/2: 4.89 cm2
S' Lateral: 3.1 cm

## 2019-09-16 ENCOUNTER — Other Ambulatory Visit: Payer: Self-pay | Admitting: Family Medicine

## 2019-09-16 DIAGNOSIS — E1159 Type 2 diabetes mellitus with other circulatory complications: Secondary | ICD-10-CM

## 2019-09-17 ENCOUNTER — Other Ambulatory Visit: Payer: Self-pay | Admitting: Family Medicine

## 2019-09-18 ENCOUNTER — Other Ambulatory Visit: Payer: Self-pay | Admitting: Family Medicine

## 2019-09-25 ENCOUNTER — Other Ambulatory Visit: Payer: Self-pay | Admitting: Family Medicine

## 2019-10-16 ENCOUNTER — Other Ambulatory Visit: Payer: Self-pay | Admitting: Family Medicine

## 2019-10-19 ENCOUNTER — Other Ambulatory Visit: Payer: Self-pay | Admitting: Internal Medicine

## 2019-10-20 NOTE — Telephone Encounter (Signed)
Pt's age 73, wt 91.2 kg, SCr 1.14, CrCl 62.33, last ov w/ SK 08/12/19.

## 2019-10-28 ENCOUNTER — Ambulatory Visit (INDEPENDENT_AMBULATORY_CARE_PROVIDER_SITE_OTHER): Payer: Medicare Other

## 2019-10-28 DIAGNOSIS — I495 Sick sinus syndrome: Secondary | ICD-10-CM | POA: Diagnosis not present

## 2019-10-28 LAB — CUP PACEART REMOTE DEVICE CHECK
Battery Remaining Longevity: 45 mo
Battery Voltage: 2.97 V
Brady Statistic AP VP Percent: 0.12 %
Brady Statistic AP VS Percent: 45.75 %
Brady Statistic AS VP Percent: 0.03 %
Brady Statistic AS VS Percent: 54.11 %
Brady Statistic RA Percent Paced: 45.82 %
Brady Statistic RV Percent Paced: 0.14 %
Date Time Interrogation Session: 20211005093407
Implantable Lead Implant Date: 20141208
Implantable Lead Implant Date: 20141208
Implantable Lead Location: 753859
Implantable Lead Location: 753860
Implantable Lead Model: 5076
Implantable Lead Model: 5076
Implantable Pulse Generator Implant Date: 20141208
Lead Channel Impedance Value: 323 Ohm
Lead Channel Impedance Value: 361 Ohm
Lead Channel Impedance Value: 380 Ohm
Lead Channel Impedance Value: 418 Ohm
Lead Channel Pacing Threshold Amplitude: 0.5 V
Lead Channel Pacing Threshold Amplitude: 0.75 V
Lead Channel Pacing Threshold Pulse Width: 0.4 ms
Lead Channel Pacing Threshold Pulse Width: 0.4 ms
Lead Channel Sensing Intrinsic Amplitude: 1.625 mV
Lead Channel Sensing Intrinsic Amplitude: 1.625 mV
Lead Channel Sensing Intrinsic Amplitude: 6.25 mV
Lead Channel Sensing Intrinsic Amplitude: 6.25 mV
Lead Channel Setting Pacing Amplitude: 2 V
Lead Channel Setting Pacing Amplitude: 2.5 V
Lead Channel Setting Pacing Pulse Width: 0.4 ms
Lead Channel Setting Sensing Sensitivity: 2 mV

## 2019-10-31 NOTE — Progress Notes (Signed)
Remote pacemaker transmission.   

## 2019-12-01 ENCOUNTER — Telehealth: Payer: Self-pay | Admitting: Family Medicine

## 2019-12-01 NOTE — Telephone Encounter (Signed)
LVM for pt to rtn my call to R/S AWV with NHA on 12/29/19.

## 2019-12-29 ENCOUNTER — Other Ambulatory Visit (INDEPENDENT_AMBULATORY_CARE_PROVIDER_SITE_OTHER): Payer: Medicare Other

## 2019-12-29 ENCOUNTER — Other Ambulatory Visit: Payer: Self-pay

## 2019-12-29 ENCOUNTER — Ambulatory Visit: Payer: Medicare Other

## 2019-12-29 DIAGNOSIS — E785 Hyperlipidemia, unspecified: Secondary | ICD-10-CM

## 2019-12-29 DIAGNOSIS — E1159 Type 2 diabetes mellitus with other circulatory complications: Secondary | ICD-10-CM

## 2019-12-29 DIAGNOSIS — E559 Vitamin D deficiency, unspecified: Secondary | ICD-10-CM

## 2019-12-29 DIAGNOSIS — R7989 Other specified abnormal findings of blood chemistry: Secondary | ICD-10-CM

## 2019-12-29 DIAGNOSIS — Z79899 Other long term (current) drug therapy: Secondary | ICD-10-CM | POA: Diagnosis not present

## 2019-12-29 LAB — CBC WITH DIFFERENTIAL/PLATELET
Basophils Absolute: 0.1 10*3/uL (ref 0.0–0.1)
Basophils Relative: 1 % (ref 0.0–3.0)
Eosinophils Absolute: 0.1 10*3/uL (ref 0.0–0.7)
Eosinophils Relative: 1.9 % (ref 0.0–5.0)
HCT: 37.3 % (ref 36.0–46.0)
Hemoglobin: 12.4 g/dL (ref 12.0–15.0)
Lymphocytes Relative: 27.2 % (ref 12.0–46.0)
Lymphs Abs: 1.6 10*3/uL (ref 0.7–4.0)
MCHC: 33.3 g/dL (ref 30.0–36.0)
MCV: 91.7 fl (ref 78.0–100.0)
Monocytes Absolute: 0.5 10*3/uL (ref 0.1–1.0)
Monocytes Relative: 8.6 % (ref 3.0–12.0)
Neutro Abs: 3.5 10*3/uL (ref 1.4–7.7)
Neutrophils Relative %: 61.3 % (ref 43.0–77.0)
Platelets: 226 10*3/uL (ref 150.0–400.0)
RBC: 4.06 Mil/uL (ref 3.87–5.11)
RDW: 13.7 % (ref 11.5–15.5)
WBC: 5.7 10*3/uL (ref 4.0–10.5)

## 2019-12-29 LAB — BASIC METABOLIC PANEL
BUN: 30 mg/dL — ABNORMAL HIGH (ref 6–23)
CO2: 23 mEq/L (ref 19–32)
Calcium: 9.7 mg/dL (ref 8.4–10.5)
Chloride: 104 mEq/L (ref 96–112)
Creatinine, Ser: 1.32 mg/dL — ABNORMAL HIGH (ref 0.40–1.20)
GFR: 39.8 mL/min — ABNORMAL LOW (ref >60.00)
Glucose, Bld: 89 mg/dL (ref 70–99)
Potassium: 4 mEq/L (ref 3.5–5.1)
Sodium: 140 mEq/L (ref 135–145)

## 2019-12-29 LAB — LIPID PANEL
Cholesterol: 232 mg/dL — ABNORMAL HIGH (ref 0–200)
HDL: 41.9 mg/dL (ref >39.00)
NonHDL: 189.61
Total CHOL/HDL Ratio: 6
Triglycerides: 228 mg/dL — ABNORMAL HIGH (ref 0.0–149.0)
VLDL: 45.6 mg/dL — ABNORMAL HIGH (ref 0.0–40.0)

## 2019-12-29 LAB — HEPATIC FUNCTION PANEL
ALT: 14 U/L (ref 0–35)
AST: 15 U/L (ref 0–37)
Albumin: 4.4 g/dL (ref 3.5–5.2)
Alkaline Phosphatase: 65 U/L (ref 39–117)
Bilirubin, Direct: 0.1 mg/dL (ref 0.0–0.3)
Total Bilirubin: 0.6 mg/dL (ref 0.2–1.2)
Total Protein: 7 g/dL (ref 6.0–8.3)

## 2019-12-29 LAB — LDL CHOLESTEROL, DIRECT: Direct LDL: 139 mg/dL

## 2019-12-29 LAB — VITAMIN D 25 HYDROXY (VIT D DEFICIENCY, FRACTURES): VITD: 28.65 ng/mL — ABNORMAL LOW (ref 30.00–100.00)

## 2019-12-29 LAB — MICROALBUMIN / CREATININE URINE RATIO
Creatinine,U: 103.7 mg/dL
Microalb Creat Ratio: 1.5 mg/g (ref 0.0–30.0)
Microalb, Ur: 1.5 mg/dL (ref 0.0–1.9)

## 2019-12-29 LAB — HEMOGLOBIN A1C: Hgb A1c MFr Bld: 6.1 % (ref 4.6–6.5)

## 2020-01-04 ENCOUNTER — Encounter: Payer: Self-pay | Admitting: Family Medicine

## 2020-01-04 NOTE — Progress Notes (Signed)
Afua Hoots T. Jerrye Seebeck, MD, [place] at [facility] [place], [address]  Phone: [phone]  FAX: [phone]  Nina Carpenter - 74 y.o. female  MRN [id number]  Date of Birth: 1945/10/01  Date: 01/05/2020  PCP: [name], MD  Referral: [name], MD  Chief Complaint  Patient presents with  . Annual Exam    This visit occurred during the SARS-CoV-2 public health emergency.  Safety protocols were in place, including screening questions prior to the visit, additional usage of staff PPE, and extensive cleaning of exam room while observing appropriate contact time as indicated for disinfecting solutions.   Patient Care Team: [name], MD as PCP - General Subjective:   Nina Carpenter is a 74 y.o. pleasant patient who presents for a medicare wellness examination:  The patient came in today with multiple ongoing medical issues requiring management, and is unable to do Medicare wellness exam secondary to need to address these issues and she had not completed any of the preevaluation Medicare work.  Diabetes Mellitus: Tolerating Medications: yes Compliance with diet: fair, Body mass index is 37.98 kg/m. Exercise: minimal / intermittent Avg blood sugars at home: not checking Foot problems: none Hypoglycemia: none No nausea, vomitting, blurred vision, polyuria.  Lab Results  Component Value Date   HGBA1C 6.1 12/29/2019   HGBA1C 6.4 (A) 06/30/2019   HGBA1C 9.0 (A) 03/24/2019   Lab Results  Component Value Date   MICROALBUR 1.5 12/29/2019   LDLCALC 89 04/16/2017   CREATININE 1.32 (H) 12/29/2019    Wt Readings from Last 3 Encounters:  01/05/20 201 lb (91.2 kg)  08/12/19 201 lb (91.2 kg)  07/10/19 200 lb (90.7 kg)    HTN: Tolerating all medications without side effects She does routinely check her blood pressure at home and she says that it is about  120/75. No CP, no sob. No HA.  BP Readings from Last 3 Encounters:  01/05/20 (!) 160/78  08/12/19 (!) 150/72  07/10/19 106/26    Basic Metabolic Panel:    Component Value Date/Time   NA 140 12/29/2019 1009   NA CANCELED 08/12/2019 0933   NA 139 05/09/2013 2023   K 4.0 12/29/2019 1009   K 3.9 05/09/2013 2023   CL 104 12/29/2019 1009   CL 103 05/09/2013 2023   CO2 23 12/29/2019 1009   CO2 29 05/09/2013 2023   BUN 30 (H) 12/29/2019 1009   BUN 22 08/12/2019 0933   BUN 14 05/09/2013 2023   CREATININE 1.32 (H) 12/29/2019 1009   CREATININE 0.97 05/09/2013 2023   GLUCOSE 89 12/29/2019 1009   GLUCOSE 141 (H) 05/09/2013 2023   CALCIUM 9.7 12/29/2019 1009   CALCIUM 9.5 05/09/2013 2023      Flu - today #3 Covid - hesitant  Mammo - does at GYN office  Colon  Statin - zocor Stopped   Lipids: Stopped her statin, this is in the setting of prior stroke and diabetes.  Her cholesterol subsequently did increase she stopped secondary to myalgia  Lipids: Lab Results  Component Value Date   CHOL 232 (H) 12/29/2019   Lab Results  Component Value Date   HDL 41.90 12/29/2019   Lab Results  Component Value Date   LDLCALC 89 04/16/2017   Lab Results  Component Value Date   TRIG 228.0 (H) 12/29/2019   Lab Results  Component Value Date   CHOLHDL 6 12/29/2019  Lab Results  Component Value Date   ALT 14 12/29/2019   AST 15 12/29/2019   ALKPHOS 65 12/29/2019   BILITOT 0.6 12/29/2019     She is compliant with taking all of her cardiac medication as well as her antihypertensive at this point.  Health Maintenance  Topic Date Due  . OPHTHALMOLOGY EXAM  01/24/2019  . COVID-19 Vaccine (3 - Booster for Pfizer series) 10/02/2019  . FOOT EXAM  01/23/2020 (Originally 04/24/2018)  . MAMMOGRAM  01/23/2020 (Originally 05/30/2017)  . DEXA SCAN  01/23/2020 (Originally 07/28/2010)  . COLONOSCOPY  01/23/2020 (Originally 06/16/2018)  . TETANUS/TDAP  01/23/2020 (Originally 07/27/1964)  .  HEMOGLOBIN A1C  06/28/2020  . INFLUENZA VACCINE  Completed  . Hepatitis C Screening  Completed  . PNA vac Low Risk Adult  Completed   Immunization History  Administered Date(s) Administered  . Fluad Quad(high Dose 65+) 01/05/2020  . Influenza Split 11/13/2011  . Influenza, High Dose Seasonal PF 11/23/2016  . Influenza,inj,Quad PF,6+ Mos 11/18/2012, 10/06/2013, 12/23/2014, 03/20/2016, 01/28/2018  . PFIZER SARS-COV-2 Vaccination 03/07/2019, 04/01/2019  . Pneumococcal Conjugate-13 10/06/2013  . Pneumococcal Polysaccharide-23 10/11/2010   Diabetes Mellitus: Tolerating Medications: yes Compliance with diet: fair, Body mass index is 37.98 kg/m. Exercise: minimal / intermittent Avg blood sugars at home: not checking Foot problems: none Hypoglycemia: none No nausea, vomitting, blurred vision, polyuria.  Lab Results  Component Value Date   HGBA1C 6.1 12/29/2019   HGBA1C 6.4 (A) 06/30/2019   HGBA1C 9.0 (A) 03/24/2019   Lab Results  Component Value Date   MICROALBUR 1.5 12/29/2019   LDLCALC 89 04/16/2017   CREATININE 1.32 (H) 12/29/2019    Wt Readings from Last 3 Encounters:  01/05/20 201 lb (91.2 kg)  08/12/19 201 lb (91.2 kg)  07/10/19 200 lb (90.7 kg)    HTN: Tolerating all medications without side effects Stable and at goal No CP, no sob. No HA.  BP Readings from Last 3 Encounters:  01/05/20 (!) 160/78  08/12/19 (!) 150/72  07/10/19 638/45    Basic Metabolic Panel:    Component Value Date/Time   NA 140 12/29/2019 1009   NA CANCELED 08/12/2019 0933   NA 139 05/09/2013 2023   K 4.0 12/29/2019 1009   K 3.9 05/09/2013 2023   CL 104 12/29/2019 1009   CL 103 05/09/2013 2023   CO2 23 12/29/2019 1009   CO2 29 05/09/2013 2023   BUN 30 (H) 12/29/2019 1009   BUN 22 08/12/2019 0933   BUN 14 05/09/2013 2023   CREATININE 1.32 (H) 12/29/2019 1009   CREATININE 0.97 05/09/2013 2023   GLUCOSE 89 12/29/2019 1009   GLUCOSE 141 (H) 05/09/2013 2023   CALCIUM 9.7 12/29/2019  1009   CALCIUM 9.5 05/09/2013 2023     Patient Active Problem List   Diagnosis Date Noted  . History of stroke 01/06/2013    Priority: High  . Atrial fibrillation ([place]) 12/29/2012    Priority: High  . Type 2 diabetes mellitus with vascular disease ([place]) 05/04/2008    Priority: High  . Cardiac pacemaker in situ 01/06/2013    Priority: Medium  . History of noncompliance with medical treatment 01/06/2013    Priority: Medium  . Tachy-brady syndrome ([place]) 12/29/2012    Priority: Medium  . Hx of rheumatic fever 08/07/2011    Priority: Medium  . NEURALGIA, TRIGEMINAL 01/26/2010    Priority: Medium  . Hyperlipidemia LDL goal <70 05/04/2008    Priority: Medium  . Essential hypertension 05/04/2008    Priority:  Medium  . ASTHMA 05/04/2008    Priority: Medium  . OSA (obstructive sleep apnea) 05/25/2014  . Chronic anticoagulation- Eliquis 01/27/2013  . ALLERGIC RHINITIS 05/04/2008  . GERD 05/04/2008  . Asthma 05/04/2008    Past Medical History:  Diagnosis Date  . Acid reflux disease   . Asthma    a. Remotely.  . Chronic combined systolic and diastolic CHF (congestive heart failure) ([place])    a. Felt related to AF. EF 45-50% by echo 12/2012.  Marland Kitchen CVA (cerebral vascular accident) ([place])    a. Thromboemoblic L MCA CVA 83/2549 with no residual deficit except mild occasional word finding. b. 8-26% RICA/LICA by dopplers 41/5/83.  Marland Kitchen GERD (gastroesophageal reflux disease)   . H/O medication noncompliance    a. Per PCP note 01/06/13: Previously refused anything greater than aspirin, refused treatment for DM, and often stopped hypertensive meds and lipid meds.  Marland Kitchen Hx of rheumatic fever    a. As a child.  . Hyperlipidemia   . Hypertension   . Kidney stones    a. Remotely.  Marland Kitchen PAF (paroxysmal atrial fibrillation) ([place])    a. Dx 09/2012, chronicity unclear at that time. Initially refused Xarelto due to risk of bleeding. b. Sustained CVA 12/2012 - agreeable to taking Eliquis. Tachybrady syndrome  during that admission s/p Medtronic PPM implantation;  c. Failed flecainide, sotalol, amiodarone (itching), and propafenone.  . Tachy-brady syndrome ([place])    a. AF RVR with pauses >5sec requiring Medtronic PPM 12/2012.  . Tubular adenoma of colon 05/2008  . Type 2 diabetes mellitus ([place])     Past Surgical History:  Procedure Laterality Date  . CHOLECYSTECTOMY    . DESTRUCTION TRIGEMINAL NERVE VIA NEUROLYTIC AGENT  NOv @013    Gamma Knife   . ENDOMETRIAL ABLATION    . INSERT / REPLACE / REMOVE PACEMAKER    . PACEMAKER INSERTION  12-30-12   MDT dual chamber pacemaker implanted by Dr Caryl Comes for tachy-brady syndrome  . PERMANENT PACEMAKER INSERTION N/A 12/30/2012   Procedure: PERMANENT PACEMAKER INSERTION;  Surgeon: Deboraha Sprang, MD;  Location: [facility] C[facility] LAB;  Service: Cardiovascular;  Laterality: N/A;  . TUBAL LIGATION     ectopic preganacy     Family History  Problem Relation Age of Onset  . Stroke Mother   . Diabetes Mother   . Hypertension Mother     Past Medical History, Surgical History, Social History, Family History, Problem List, Medications, and Allergies have been reviewed and updated if relevant.  Review of Systems: Pertinent positives are listed above.  Otherwise, a full 14 point review of systems has been done in full and it is negative except where it is noted positive.  Objective:   BP (!) 160/78 (BP Location: Left Arm, Patient Position: Sitting)   Pulse (!) 51   Temp 97.8 F (36.6 C)   Ht 5' 1"  (1.549 m)   Wt 201 lb (91.2 kg)   SpO2 93%   BMI 37.98 kg/m  Fall Risk  01/05/2020 07/15/2018 04/16/2017 03/20/2016 03/17/2015  Falls in the past year? 1 0 No No No  Number falls in past yr: 0 - - - -  Injury with Fall? 0 - - - -  Risk for fall due to : No Fall Risks - - - -  Follow up Falls evaluation completed - - - -   Ideal Body Weight: Weight in (lb) to have BMI = 25: 132  Hearing Screening   125Hz  250Hz  500Hz  1000Hz  2000Hz  3000Hz  4000Hz  6000Hz   8000Hz   Right ear:            Left ear:           Vision Screening Comments: June 2021 Depression screen [facility] 2/9 01/05/2020 07/15/2018 04/16/2017 03/20/2016 03/17/2015  Decreased Interest 0 0 0 0 0  Down, Depressed, Hopeless 0 0 0 0 0  PHQ - 2 Score 0 0 0 0 0  Altered sleeping - 0 0 - -  Tired, decreased energy - 0 0 - -  Change in appetite - 0 0 - -  Feeling bad or failure about yourself  - 0 0 - -  Trouble concentrating - 0 0 - -  Moving slowly or fidgety/restless - 0 0 - -  Suicidal thoughts - 0 0 - -  PHQ-9 Score - 0 0 - -  Difficult doing work/chores - Not difficult at all Not difficult at all - -     GEN: well developed, well nourished, no acute distress Eyes: conjunctiva and lids normal, PERRLA, EOMI ENT: TM clear, nares clear, oral exam WNL Neck: supple, no lymphadenopathy, no thyromegaly, no JVD Pulm: clear to auscultation and percussion, respiratory effort normal CV: regular rate and rhythm, S1-S2, no murmur, rub or gallop, no bruits Chest: no scars, masses, no lumps BREAST: breast exam declined GI: soft, non-tender; no hepatosplenomegaly, masses; active bowel sounds all quadrants GU: GU exam declined Lymph: no cervical, axillary or inguinal adenopathy MSK: gait normal, muscle tone and strength WNL, no joint swelling, effusions, discoloration, crepitus  SKIN: clear, good turgor, color WNL, no rashes, lesions, or ulcerations Neuro: normal mental status, normal strength, sensation, and motion Psych: alert; oriented to person, place and time, normally interactive and not anxious or depressed in appearance.   All labs reviewed with patient.  Lipids: Lab Results  Component Value Date   CHOL 232 (H) 12/29/2019   Lab Results  Component Value Date   HDL 41.90 12/29/2019   Lab Results  Component Value Date   LDLCALC 89 04/16/2017   Lab Results  Component Value Date   TRIG 228.0 (H) 12/29/2019   Lab Results  Component Value Date   CHOLHDL 6 12/29/2019   CBC: CBC Latest Ref Rng &  Units 12/29/2019 08/12/2019 07/29/2018  WBC 4.0 - 10.5 K/uL 5.7 4.9 7.2  Hemoglobin 12.0 - 15.0 g/dL 12.4 13.9 14.3  Hematocrit 36.0 - 46.0 % 37.3 42.9 43.2  Platelets 150.0 - 400.0 K/uL 226.0 196 559.7    Basic Metabolic Panel:    Component Value Date/Time   NA 140 12/29/2019 1009   NA CANCELED 08/12/2019 0933   NA 139 05/09/2013 2023   K 4.0 12/29/2019 1009   K 3.9 05/09/2013 2023   CL 104 12/29/2019 1009   CL 103 05/09/2013 2023   CO2 23 12/29/2019 1009   CO2 29 05/09/2013 2023   BUN 30 (H) 12/29/2019 1009   BUN 22 08/12/2019 0933   BUN 14 05/09/2013 2023   CREATININE 1.32 (H) 12/29/2019 1009   CREATININE 0.97 05/09/2013 2023   GLUCOSE 89 12/29/2019 1009   GLUCOSE 141 (H) 05/09/2013 2023   CALCIUM 9.7 12/29/2019 1009   CALCIUM 9.5 05/09/2013 2023   Hepatic Function Latest Ref Rng & Units 12/29/2019 07/29/2018 04/16/2017  Total Protein 6.0 - 8.3 g/dL 7.0 6.5 7.1  Albumin 3.5 - 5.2 g/dL 4.4 4.3 4.1  AST 0 - 37 U/L 15 15 17   ALT 0 - 35 U/L 14 25 26   Alk Phosphatase 39 - 117 U/L 65  78 84  Total Bilirubin 0.2 - 1.2 mg/dL 0.6 0.6 0.8  Bilirubin, Direct 0.0 - 0.3 mg/dL 0.1 0.1 0.1    Lab Results  Component Value Date   HGBA1C 6.1 12/29/2019   Lab Results  Component Value Date   TSH 2.27 07/29/2018    No results found.  Assessment and Plan:     ICD-10-CM   1. Type 2 diabetes mellitus with vascular disease (HCC)  E11.59   2. History of stroke  Z86.73   3. Hyperlipidemia LDL goal <70  E78.5   4. Essential hypertension  I10   5. Screen for colon cancer  Z12.11 Ambulatory referral to Gastroenterology  6. Need for influenza vaccination  Z23 Flu Vaccine QUAD High Dose(Fluad)   Diabetes is stable. History of stroke, off of statin.  Restart at lower dose.  Continue with Eliquis Start Crestor 5 mg daily Check blood pressure at least once weekly and write down Refer for colonoscopy  Health Maintenance Exam: The patient's preventative maintenance and recommended screening  tests for an annual wellness exam were reviewed in full today. Brought up to date unless services declined.  Counselled on the importance of diet, exercise, and its role in overall health and mortality. The patient's FH and SH was reviewed, including their home life, tobacco status, and drug and alcohol status.  Follow-up in 1 year for physical exam or additional follow-up below.  I have personally reviewed the Medicare Annual Wellness questionnaire and have noted 1. The patient's medical and social history 2. Their use of alcohol, tobacco or illicit drugs 3. Their current medications and supplements 4. The patient's functional ability including ADL's, fall risks, home safety risks and hearing or visual             impairment. 5. Diet and physical activities 6. Evidence for depression or mood disorders 7. Reviewed Updated provider list, see scanned forms and [facility] Snapshot.  8. Reviewed whether or not the patient has HCPOA or living will, and discussed what this means with the patient.  Recommended she bring in a copy for his chart in [facility].  The patients weight, height, BMI and visual acuity have been recorded in the chart I have made referrals, counseling and provided education to the patient based review of the above and I have provided the pt with a written personalized care plan for preventive services.  I have provided the patient with a copy of your personalized plan for preventive services. Instructed to take the time to review along with their updated medication list.  Follow-up: No follow-ups on file. Or follow-up in 1 year unless noted.  Meds ordered this encounter  Medications  . rosuvastatin (CRESTOR) 5 MG tablet    Sig: Take 1 tablet (5 mg total) by mouth daily.    Dispense:  30 tablet    Refill:  3   There are no discontinued medications. Orders Placed This Encounter  Procedures  . Flu Vaccine QUAD High Dose(Fluad)  . Ambulatory referral to Gastroenterology     Signed,  Frederico Hamman T. Hammad Finkler, MD   Allergies as of 01/05/2020      Reactions   Bee Venom Anaphylaxis   Amiodarone Itching   Crestor [rosuvastatin]    myalgia   Fish Allergy Swelling   Shell fish   Iodine Itching   itching   Lipitor [atorvastatin]    myalgias   Penicillins Hives, Itching   Ace Inhibitors Cough   Lidocaine Hcl Palpitations   ALL CAINES given during dental  procedures      Medication List       Accurate as of January 05, 2020  1:34 PM. If you have any questions, ask your nurse or doctor.        Accu-Chek FastClix Lancets Misc Use to check FBS and 2 HR PP once every 2 weeks   Accu-Chek Guide Control Liqd 1 each by In Vitro route as directed.   Accu-Chek Guide Me w/Device Kit 1 each by Does not apply route every 14 (fourteen) days. Use to check FBS and 2 HR PP once every 2 weeks   Accu-Chek Guide test strip Generic drug: glucose blood Use to check FBS and 2 HR PP once every 2 weeks   Eliquis 5 MG Tabs tablet Generic drug: apixaban TAKE 1 TABLET(5 MG) BY MOUTH TWICE DAILY   glipiZIDE 10 MG 24 hr tablet Commonly known as: GLUCOTROL XL TAKE 2 TABLETS BY MOUTH EVERY DAY WITH BREAKFAST   hydrochlorothiazide 25 MG tablet Commonly known as: HYDRODIURIL Take 1 tablet (25 mg total) by mouth daily.   losartan 100 MG tablet Commonly known as: COZAAR TAKE 1 TABLET(100 MG) BY MOUTH DAILY   metFORMIN 500 MG 24 hr tablet Commonly known as: GLUCOPHAGE-XR TAKE 2 TABLETS(1000 MG) BY MOUTH DAILY WITH BREAKFAST   metoprolol tartrate 100 MG tablet Commonly known as: LOPRESSOR TAKE 1 TABLET(100 MG) BY MOUTH TWICE DAILY   mometasone 50 MCG/ACT nasal spray Commonly known as: Nasonex Place 2 sprays into the nose daily. What changed:   when to take this  reasons to take this   montelukast 10 MG tablet Commonly known as: SINGULAIR TAKE 1 TABLET BY MOUTH AT BEDTIME   pioglitazone 30 MG tablet Commonly known as: ACTOS TAKE 1 TABLET(30 MG) BY MOUTH  DAILY   rosuvastatin 5 MG tablet Commonly known as: Crestor Take 1 tablet (5 mg total) by mouth daily. Started by: [name], MD   simvastatin 20 MG tablet Commonly known as: ZOCOR Take 1 tablet (20 mg total) by mouth at bedtime.   sitaGLIPtin 100 MG tablet Commonly known as: Januvia TAKE 1 TABLET(100 MG) BY MOUTH DAILY

## 2020-01-05 ENCOUNTER — Ambulatory Visit (INDEPENDENT_AMBULATORY_CARE_PROVIDER_SITE_OTHER): Payer: Medicare Other | Admitting: Family Medicine

## 2020-01-05 ENCOUNTER — Encounter: Payer: Self-pay | Admitting: Family Medicine

## 2020-01-05 ENCOUNTER — Other Ambulatory Visit: Payer: Self-pay

## 2020-01-05 ENCOUNTER — Other Ambulatory Visit: Payer: Self-pay | Admitting: Family Medicine

## 2020-01-05 VITALS — BP 160/78 | HR 51 | Temp 97.8°F | Ht 61.0 in | Wt 201.0 lb

## 2020-01-05 DIAGNOSIS — Z1211 Encounter for screening for malignant neoplasm of colon: Secondary | ICD-10-CM | POA: Diagnosis not present

## 2020-01-05 DIAGNOSIS — Z8673 Personal history of transient ischemic attack (TIA), and cerebral infarction without residual deficits: Secondary | ICD-10-CM

## 2020-01-05 DIAGNOSIS — Z23 Encounter for immunization: Secondary | ICD-10-CM

## 2020-01-05 DIAGNOSIS — E785 Hyperlipidemia, unspecified: Secondary | ICD-10-CM | POA: Diagnosis not present

## 2020-01-05 DIAGNOSIS — I10 Essential (primary) hypertension: Secondary | ICD-10-CM

## 2020-01-05 DIAGNOSIS — E1159 Type 2 diabetes mellitus with other circulatory complications: Secondary | ICD-10-CM

## 2020-01-05 MED ORDER — ROSUVASTATIN CALCIUM 5 MG PO TABS: 5.0000 mg | ORAL_TABLET | Freq: Every day | ORAL | 3 refills | Status: DC

## 2020-01-05 NOTE — Patient Instructions (Signed)
Start your rosuvatatin (Crestor) at 5 mg a day. If you get muscle aches then take 1/2 tablet each day If you still get muscle aches, decrease dosing to 1/2 tablet every other day

## 2020-01-14 ENCOUNTER — Other Ambulatory Visit: Payer: Self-pay | Admitting: Family Medicine

## 2020-01-19 ENCOUNTER — Telehealth: Payer: Self-pay | Admitting: Family Medicine

## 2020-01-19 NOTE — Progress Notes (Signed)
  Chronic Care Management   Outreach Note  01/19/2020 Name: DONIS KOTOWSKI MRN: [id number] DOB: 1945-02-12  Referred by: [name], MD Reason for referral : Chronic Care Management   An unsuccessful telephone outreach was attempted today. The patient was referred to the pharmacist for assistance with care management and care coordination.   Follow Up Plan:   Hilario Quarry  Upstream Scheduler

## 2020-01-21 ENCOUNTER — Telehealth: Payer: Self-pay | Admitting: Family Medicine

## 2020-01-21 NOTE — Progress Notes (Signed)
  Chronic Care Management   Outreach Note  01/21/2020 Name: DONALYN SCHNEEBERGER MRN: [id number] DOB: 07-23-1945  Referred by: [name], MD Reason for referral : Chronic Care Management   A second unsuccessful telephone outreach was attempted today. The patient was referred to pharmacist for assistance with care management and care coordination.  Follow Up Plan:   Aggie Hacker  Upstream Scheduler

## 2020-01-22 ENCOUNTER — Telehealth: Payer: Self-pay | Admitting: Family Medicine

## 2020-01-22 NOTE — Chronic Care Management (AMB) (Signed)
  Chronic Care Management   Note  01/22/2020 Name: Nina Carpenter MRN: [id number] DOB: 13-Mar-1945  Nina Carpenter is a 75 y.o. year old female who is a primary care patient of [name], Karleen Hampshire, MD. I reached out to Marko Plume by phone today in response to a referral sent by Ms. [name]'s PCP, [name], MD.   [name] was given information about Chronic Care Management services today including:  1. CCM service includes personalized support from designated clinical staff supervised by her physician, including individualized plan of care and coordination with other care providers 2. 24/7 contact phone numbers for assistance for urgent and routine care needs. 3. Service will only be billed when office clinical staff spend 20 minutes or more in a month to coordinate care. 4. Only one practitioner may furnish and bill the service in a calendar month. 5. The patient may stop CCM services at any time (effective at the end of the month) by phone call to the office staff.   Patient agreed to services and verbal consent obtained.   Follow up plan:   Aggie Hacker  Upstream Scheduler

## 2020-01-25 ENCOUNTER — Other Ambulatory Visit: Payer: Self-pay | Admitting: Family Medicine

## 2020-01-25 DIAGNOSIS — E1159 Type 2 diabetes mellitus with other circulatory complications: Secondary | ICD-10-CM

## 2020-01-27 ENCOUNTER — Ambulatory Visit (INDEPENDENT_AMBULATORY_CARE_PROVIDER_SITE_OTHER): Payer: Medicare Other

## 2020-01-27 DIAGNOSIS — I495 Sick sinus syndrome: Secondary | ICD-10-CM | POA: Diagnosis not present

## 2020-01-28 LAB — CUP PACEART REMOTE DEVICE CHECK
Battery Remaining Longevity: 42 mo
Battery Voltage: 2.97 V
Brady Statistic AP VP Percent: 0.1 %
Brady Statistic AP VS Percent: 48.19 %
Brady Statistic AS VP Percent: 0.03 %
Brady Statistic AS VS Percent: 51.69 %
Brady Statistic RA Percent Paced: 48.23 %
Brady Statistic RV Percent Paced: 0.13 %
Date Time Interrogation Session: 20220104214453
Implantable Lead Implant Date: 20141208
Implantable Lead Implant Date: 20141208
Implantable Lead Location: 753859
Implantable Lead Location: 753860
Implantable Lead Model: 5076
Implantable Lead Model: 5076
Implantable Pulse Generator Implant Date: 20141208
Lead Channel Impedance Value: 342 Ohm
Lead Channel Impedance Value: 361 Ohm
Lead Channel Impedance Value: 380 Ohm
Lead Channel Impedance Value: 399 Ohm
Lead Channel Pacing Threshold Amplitude: 0.5 V
Lead Channel Pacing Threshold Amplitude: 0.75 V
Lead Channel Pacing Threshold Pulse Width: 0.4 ms
Lead Channel Pacing Threshold Pulse Width: 0.4 ms
Lead Channel Sensing Intrinsic Amplitude: 1.625 mV
Lead Channel Sensing Intrinsic Amplitude: 1.625 mV
Lead Channel Sensing Intrinsic Amplitude: 6 mV
Lead Channel Sensing Intrinsic Amplitude: 6 mV
Lead Channel Setting Pacing Amplitude: 2 V
Lead Channel Setting Pacing Amplitude: 2.5 V
Lead Channel Setting Pacing Pulse Width: 0.4 ms
Lead Channel Setting Sensing Sensitivity: 2 mV

## 2020-01-30 ENCOUNTER — Ambulatory Visit: Payer: Medicare Other

## 2020-02-11 NOTE — Progress Notes (Signed)
Remote pacemaker transmission.   

## 2020-03-03 ENCOUNTER — Other Ambulatory Visit: Payer: Self-pay | Admitting: Family Medicine

## 2020-03-03 DIAGNOSIS — E1159 Type 2 diabetes mellitus with other circulatory complications: Secondary | ICD-10-CM

## 2020-03-08 ENCOUNTER — Telehealth: Payer: Self-pay

## 2020-03-08 NOTE — Chronic Care Management (AMB) (Addendum)
Chronic Care Management Pharmacy Assistant   Name: Nina Carpenter  MRN: [id number] DOB: 23-Oct-1945  Reason for Encounter: Initial questions for CCM visit scheduled 03/10/20  PCP : [name], MD  Allergies:   Allergies  Allergen Reactions   Bee Venom Anaphylaxis   Amiodarone Itching   Crestor [Rosuvastatin]     myalgia   Fish Allergy Swelling    Shell fish   Iodine Itching    itching   Lipitor [Atorvastatin]     myalgias   Penicillins Hives and Itching   Ace Inhibitors Cough   Lidocaine Hcl Palpitations    ALL CAINES given during dental procedures    Medications: Outpatient Encounter Medications as of 03/08/2020  Medication Sig   Accu-Chek FastClix Lancets MISC USE TO CHECK FBS AND 2 HR PP ONCE EVERY 2 WEEKS   Blood Glucose Calibration (ACCU-CHEK GUIDE CONTROL) LIQD 1 each by In Vitro route as directed.   Blood Glucose Monitoring Suppl (ACCU-CHEK GUIDE ME) w/Device KIT 1 each by Does not apply route every 14 (fourteen) days. Use to check FBS and 2 HR PP once every 2 weeks   ELIQUIS 5 MG TABS tablet TAKE 1 TABLET(5 MG) BY MOUTH TWICE DAILY   glipiZIDE (GLUCOTROL XL) 10 MG 24 hr tablet TAKE 2 TABLETS BY MOUTH EVERY DAY WITH BREAKFAST   glucose blood (ACCU-CHEK GUIDE) test strip Use to check FBS and 2 HR PP once every 2 weeks   hydrochlorothiazide (HYDRODIURIL) 25 MG tablet TAKE 1 TABLET(25 MG) BY MOUTH DAILY   losartan (COZAAR) 100 MG tablet TAKE 1 TABLET(100 MG) BY MOUTH DAILY   metFORMIN (GLUCOPHAGE-XR) 500 MG 24 hr tablet TAKE 2 TABLETS(1000 MG) BY MOUTH DAILY WITH BREAKFAST   metoprolol tartrate (LOPRESSOR) 100 MG tablet TAKE 1 TABLET(100 MG) BY MOUTH TWICE DAILY   mometasone (NASONEX) 50 MCG/ACT nasal spray Place 2 sprays into the nose daily. (Patient taking differently: Place 2 sprays into the nose as needed.)   montelukast (SINGULAIR) 10 MG tablet TAKE 1 TABLET BY MOUTH AT BEDTIME   pioglitazone (ACTOS) 30 MG tablet TAKE 1 TABLET(30 MG) BY MOUTH DAILY    rosuvastatin (CRESTOR) 5 MG tablet Take 1 tablet (5 mg total) by mouth daily.   sitaGLIPtin (JANUVIA) 100 MG tablet TAKE 1 TABLET(100 MG) BY MOUTH DAILY   No facility-administered encounter medications on file as of 03/08/2020.    Current Diagnosis: Patient Active Problem List   Diagnosis Date Noted   OSA (obstructive sleep apnea) 05/25/2014   Chronic anticoagulation- Eliquis 01/27/2013   History of stroke 01/06/2013   Cardiac pacemaker in situ 01/06/2013   History of noncompliance with medical treatment 01/06/2013   Atrial fibrillation ([place]) 12/29/2012   Tachy-brady syndrome ([place]) 12/29/2012   Hx of rheumatic fever 08/07/2011   NEURALGIA, TRIGEMINAL 01/26/2010   Type 2 diabetes mellitus with vascular disease ([place]) 05/04/2008   Hyperlipidemia LDL goal <70 05/04/2008   Essential hypertension 05/04/2008   ALLERGIC RHINITIS 05/04/2008   ASTHMA 05/04/2008   GERD 05/04/2008   Asthma 05/04/2008    Have you seen any other providers since your last visit with PCP? No  Any changes in your medications or health? No   Patient's preferred pharmacy is:  [facility], [place] [address] Phone: [phone] Fax: [phone]  [id number] - [place], [place]  [place] [address] Phone: [phone] Fax: [phone]  Attempted to contact Daquana Paddock to remind patient  to have all medications, supplements and any blood glucose and blood pressure readings available for review with Debbora Dus, Pharm. D, at her telephone visit on 03/10/20 at 11:00 AM. Unable to successfully contact patient. Multiple messages were left.  Follow-Up:  Pharmacist Review  Debbora Dus, CPP notified  Margaretmary Dys, [place] Assistant [phone]  I have reviewed the care management  and care coordination activities outlined in this encounter and I am certifying that I agree with the content of this note. No further action required.  Debbora Dus, PharmD Clinical Pharmacist [facility] at [facility] [phone]

## 2020-03-10 ENCOUNTER — Telehealth: Payer: Medicare Other

## 2020-03-13 ENCOUNTER — Other Ambulatory Visit: Payer: Self-pay | Admitting: Family Medicine

## 2020-03-17 ENCOUNTER — Other Ambulatory Visit: Payer: Self-pay | Admitting: Family Medicine

## 2020-03-17 NOTE — Telephone Encounter (Signed)
Last office visit 01/05/2020 for CPE.  Not on current medication list.  Refill?

## 2020-03-18 ENCOUNTER — Telehealth: Payer: Self-pay | Admitting: Family Medicine

## 2020-03-18 NOTE — Telephone Encounter (Signed)
LVM for pt to rtn my call to schedule AWV with [facility].  

## 2020-03-22 ENCOUNTER — Ambulatory Visit: Payer: Medicare Other | Admitting: Gastroenterology

## 2020-03-23 DIAGNOSIS — M542 Cervicalgia: Secondary | ICD-10-CM | POA: Diagnosis not present

## 2020-03-23 DIAGNOSIS — M7582 Other shoulder lesions, left shoulder: Secondary | ICD-10-CM | POA: Diagnosis not present

## 2020-04-05 ENCOUNTER — Other Ambulatory Visit: Payer: Self-pay | Admitting: Family Medicine

## 2020-04-06 ENCOUNTER — Telehealth: Payer: Self-pay

## 2020-04-06 NOTE — Chronic Care Management (AMB) (Signed)
Chronic Care Management Pharmacy Assistant   Name: Nina Carpenter  MRN: [id number] DOB: January 06, 1946  Reason for Encounter: Initial Questions for 04/12/20 appointment   Recent office visits:  01/05/20- Dr. Lorelei Pont- PCP- started Rosuvastatin 5 mg  Recent consult visits:  10/28/19- Dr. Virl Axe- Cardiology  [facility] visits:  None in previous 6 months  Medications: Outpatient Encounter Medications as of 04/06/2020  Medication Sig  . Accu-Chek FastClix Lancets MISC USE TO CHECK FBS AND 2 HR PP ONCE EVERY 2 WEEKS  . Blood Glucose Calibration (ACCU-CHEK GUIDE CONTROL) LIQD 1 each by In Vitro route as directed.  . Blood Glucose Monitoring Suppl (ACCU-CHEK GUIDE ME) w/Device KIT 1 each by Does not apply route every 14 (fourteen) days. Use to check FBS and 2 HR PP once every 2 weeks  . ELIQUIS 5 MG TABS tablet TAKE 1 TABLET(5 MG) BY MOUTH TWICE DAILY  . glipiZIDE (GLUCOTROL XL) 10 MG 24 hr tablet TAKE 2 TABLETS BY MOUTH EVERY DAY WITH BREAKFAST  . glucose blood (ACCU-CHEK GUIDE) test strip Use to check FBS and 2 HR PP once every 2 weeks  . hydrochlorothiazide (HYDRODIURIL) 25 MG tablet TAKE 1 TABLET(25 MG) BY MOUTH DAILY  . losartan (COZAAR) 100 MG tablet TAKE 1 TABLET(100 MG) BY MOUTH DAILY  . metFORMIN (GLUCOPHAGE-XR) 500 MG 24 hr tablet TAKE 2 TABLETS(1000 MG) BY MOUTH DAILY WITH BREAKFAST  . metoprolol tartrate (LOPRESSOR) 100 MG tablet TAKE 1 TABLET(100 MG) BY MOUTH TWICE DAILY  . mometasone (NASONEX) 50 MCG/ACT nasal spray Place 2 sprays into the nose daily as needed.  . montelukast (SINGULAIR) 10 MG tablet TAKE 1 TABLET BY MOUTH AT BEDTIME  . pioglitazone (ACTOS) 30 MG tablet TAKE 1 TABLET(30 MG) BY MOUTH DAILY  . rosuvastatin (CRESTOR) 5 MG tablet Take 1 tablet (5 mg total) by mouth daily.  . sitaGLIPtin (JANUVIA) 100 MG tablet TAKE 1 TABLET(100 MG) BY MOUTH DAILY  . triamcinolone (KENALOG) 0.1 % APPLY TOPICALLY TWICE DAILY   No facility-administered encounter medications on  file as of 04/06/2020.    Lab Results  Component Value Date/Time   HGBA1C 6.1 12/29/2019 10:09 AM   HGBA1C 6.4 (A) 06/30/2019 09:34 AM   HGBA1C 9.0 (A) 03/24/2019 09:09 AM   HGBA1C 8.0 (H) 07/29/2018 09:47 AM   MICROALBUR 1.5 12/29/2019 10:09 AM   MICROALBUR 1.3 07/29/2018 09:47 AM     BP Readings from Last 3 Encounters:  01/05/20 (!) 160/78  08/12/19 (!) 150/72  07/10/19 120/84      . Have you seen any other providers since your last visit with PCP? No  . Any changes in your medications or health? Yes- states she has a little cold  . Any side effects from any medications? No  . Do you have an symptoms or problems not managed by your medications? No  . Any concerns about your health right now? No  . Has your provider asked that you check blood pressure, blood sugar, or follow special diet at home? Yes  o She is supposed to but she does not check regularly  . Do you get any type of exercise on a regular basis? Yes- states active. Still works  . Can you think of a goal you would like to reach for your health? No  . Do you have any problems getting your medications? No o Patient's preferred pharmacy is:  [facility], [place]  [place] [address] Phone: [phone] Fax: [phone]  [id number] - [place], [place] [address] [place] [address] Phone: [phone] Fax: [phone]   . Is there anything that you would like to discuss during the appointment? Yes   Nina Carpenter was reminded to have all medications, supplements and any blood glucose and blood pressure readings available for review with Nina Carpenter, Pharm. D, at her telephone visit on 04/12/20 at 12:30 .    Star Rating Drugs:  Medication:  Last Fill: Day Supply Glipizide 10 mg 04/03/20 90 Losartan 100  mg 01/27/20  90 Metformin 500 mg 03/03/20  30 Rosuvastatin 5 mg 01/05/20 90 Januvia 100 mg 03/05/20 90 Pioglitazone 30 mg 03/22/20 30  Follow-Up:  Pharmacist Review  Nina Carpenter, CPP notified  Margaretmary Dys, [place] [phone]  Total time spent for month: 20

## 2020-04-12 ENCOUNTER — Other Ambulatory Visit: Payer: Self-pay | Admitting: Internal Medicine

## 2020-04-12 ENCOUNTER — Ambulatory Visit (INDEPENDENT_AMBULATORY_CARE_PROVIDER_SITE_OTHER): Payer: Medicare Other

## 2020-04-12 ENCOUNTER — Other Ambulatory Visit: Payer: Self-pay

## 2020-04-12 VITALS — BP 111/66 | HR 65

## 2020-04-12 DIAGNOSIS — I10 Essential (primary) hypertension: Secondary | ICD-10-CM

## 2020-04-12 DIAGNOSIS — E1159 Type 2 diabetes mellitus with other circulatory complications: Secondary | ICD-10-CM | POA: Diagnosis not present

## 2020-04-12 DIAGNOSIS — E785 Hyperlipidemia, unspecified: Secondary | ICD-10-CM

## 2020-04-12 NOTE — Progress Notes (Signed)
I have personally reviewed this encounter including the documentation in this note and have collaborated with the care management provider regarding care management and care coordination activities to include development and update of the comprehensive care plan. I am certifying that I agree with the content of this note and encounter as supervising physician.    

## 2020-04-12 NOTE — Patient Instructions (Addendum)
April 12, 2020  Dear Nina Carpenter,  It was a pleasure meeting you during our initial appointment on April 12, 2020. Below is a summary of the goals we discussed and components of chronic care management. Please contact me anytime with questions or concerns.   Visit Information  There are no care plans to display for this patient.   Nina Carpenter was given information about Chronic Care Management services today including:  1. CCM service includes personalized support from designated clinical staff supervised by her physician, including individualized plan of care and coordination with other care providers 2. 24/7 contact phone numbers for assistance for urgent and routine care needs. 3. Standard insurance, coinsurance, copays and deductibles apply for chronic care management only during months in which we provide at least 20 minutes of these services. Most insurances cover these services at 100%, however patients may be responsible for any copay, coinsurance and/or deductible if applicable. This service may help you avoid the need for more expensive face-to-face services. 4. Only one practitioner may furnish and bill the service in a calendar month. 5. The patient may stop CCM services at any time (effective at the end of the month) by phone call to the office staff.  Patient agreed to services and verbal consent obtained.   The patient verbalized understanding of instructions, educational materials, and care plan provided today and agreed to receive a mailed copy of patient instructions, educational materials, and care plan.  Telephone follow up appointment with pharmacy team member scheduled for:  Debbora Dus, PharmD Clinical Pharmacist [facility] [phone]    Diabetes Mellitus and Sick Day Management Blood sugar (glucose) can be difficult to control when you are sick. Common illnesses that can cause problems for people with diabetes (diabetes  mellitus) include colds, fever, flu (influenza), nausea, vomiting, and diarrhea. These illnesses can cause stress and loss of body fluids (dehydration), and those issues can cause blood glucose levels to increase. Because of this, it is very important to take your insulin and diabetes medicines and eat some form of carbohydrate when you are sick. You should make a plan for days when you are sick (sick day plan) as part of your diabetes management plan. You and your health care provider should make this plan in advance. The following guidelines are intended to help you manage an illness that lasts for about 24 hours or less. Your health care provider may also give you more specific instructions. How to manage your blood glucose  Check your blood glucose every 2-4 hours, or as often as told by your health care provider.  If you use insulin, take your usual dose. If your blood glucose continues to be too high, you may need to take an additional insulin dose as told by your health care provider.  Know your sick day treatment goals. Your target blood glucose levels may be different when you are sick.  If you use oral diabetes medicine, continue to take your medicines. Have a plan with your health care provider for these medicines while you are sick.  If you use injectable hormone medicines other than insulin to control your diabetes, have a plan with your health care provider for these medicines while you are sick.   Follow these instructions at home Check your ketones  If you have type 1 diabetes, check your urine ketones every 4 hours.  If you have type 2 diabetes, check your urine ketones as often as told by your health  care provider. Eating and drinking  Drink enough fluid to keep your urine pale yellow. This is especially important if you have a fever, vomiting, or diarrhea. Those symptoms can lead to dehydration.  Follow instructions from your health care provider about beverages to  avoid.  Do not drink alcohol, caffeine, or drinks that contain a lot of sugar.  You need to eat some form of carbohydrates when you are sick. Eat 45-50 grams (45-50 g) of carbohydrates every 3-4 hours until you feel better. All of the food choices below contain about 15 g of carbohydrates. Plan ahead and keep some of these foods around so you have them if you get sick. ? 4-6 oz (120-177 mL) carbonated beverage that contains sugar, such as regular (not diet) soda. You may be able to drink carbonated beverages more easily if you open the beverage and let it sit at room temperature for a few minutes before drinking. ?  of a twin frozen ice pop. ? 4 oz (120 g) regular gelatin. ? 4 oz (120 mL) fruit juice. ? 4 oz (120 g) ice cream or frozen yogurt. ? 2 oz (60 g) sherbet. ? 1 slice bread or toast. ? 6 saltine crackers. ? 5 vanilla wafers. Medicines  Take-over-the-counter and prescription medicines only as told by your health care provider.  Check medicine labels for added sugars. Some medicines may contain sugar or types of sugars that can raise your blood glucose level. Questions to ask your health care provider  Should I adjust my diabetes medicines?  How often do I need to check my blood glucose?  What supplies do I need to manage my diabetes at home when I am sick?  What number can I call if I have questions?  What foods and drinks should I avoid? Contact a health care provider if:  You have been sick or have had a fever for 2 days or longer and you are not getting better.  Your blood glucose is at or above 240 mg/dl (13.3 mmol/L), even after you take an additional insulin dose.  You are unable to drink fluids without vomiting.  You have any of the following for more than 6 hours: ? Nausea. ? Vomiting. ? Diarrhea. Get help right away if:  You have difficulty breathing.  You have moderate or high ketone levels in your urine.  You have a change in how you think, feel, or  act (mental status).  You develop symptoms of diabetic ketoacidosis. These include: ? Nausea. ? Vomiting. ? Excessive thirst. ? Excessive urination. ? Fruity or sweet smelling breath. ? Rapid breathing. ? Pain in the abdomen.  Your blood glucose is lower than 54mg /dl (3.0 mmol/L).  You used emergency glucagon to treat low blood glucose. These symptoms may represent a serious problem that is an emergency. Do not wait to see if the symptoms will go away. Get medical help right away. Call your local emergency services (911 in the [place] Do not drive yourself to the hospital. Summary  Blood sugar (glucose) can be difficult to control when you are sick. Common illnesses that can cause problems for people with diabetes (diabetes mellitus) include colds, fever, flu (influenza), nausea, vomiting, and diarrhea.  Illnesses can cause stress and loss of body fluids (dehydration), and those issues can cause blood glucose levels to increase.  Make a plan for days when you are sick (sick day plan) as part of your diabetes management plan. You and your health care provider should make this plan  in advance.  It is very important to take your insulin and diabetes medicines and to eat some form of carbohydrate when you are sick.  Contact your health care provider if have problems managing your blood glucose levels when you are sick, or if you have been sick or had a fever for 2 days or longer and are not getting better. This information is not intended to replace advice given to you by your health care provider. Make sure you discuss any questions you have with your health care provider. Document Revised: 01/30/2019 Document Reviewed: 01/30/2019 Elsevier Patient Education  2021 [employer].

## 2020-04-12 NOTE — Progress Notes (Signed)
Chronic Care Management Pharmacy Note  04/12/2020 Name:  Nina Carpenter MRN:  [id number] DOB:  [birth year]  Subjective: Nina Carpenter is an 75 y.o. year old female who is a primary patient of [name], Frederico Hamman, MD.  The CCM team was consulted for assistance with disease management and care coordination needs.    Engaged with patient by telephone for initial visit in response to provider referral for pharmacy case management and/or care coordination services.   Consent to Services:  The patient was given the following information about Chronic Care Management services today, agreed to services, and gave verbal consent: 1. CCM service includes personalized support from designated clinical staff supervised by the primary care provider, including individualized plan of care and coordination with other care providers 2. 24/7 contact phone numbers for assistance for urgent and routine care needs. 3. Service will only be billed when office clinical staff spend 20 minutes or more in a month to coordinate care. 4. Only one practitioner may furnish and bill the service in a calendar month. 5.The patient may stop CCM services at any time (effective at the end of the month) by phone call to the office staff. 6. The patient will be responsible for cost sharing (co-pay) of up to 20% of the service fee (after annual deductible is met). Patient agreed to services and consent obtained.  Patient Care Team: [name], MD as PCP - General Debbora Dus, [facility] as Pharmacist (Pharmacist)  CCM consent 01/22/20  Patient concerns: Denies any health concerns. Reports she her sugar is doing really well. Watching her diet.   Recent office visits:  01/05/20- Dr. Lorelei Pont- PCP- started Rosuvastatin 5 mg  Recent consult visits:  10/28/19- Dr. Virl Axe- Cardiology - pacemaker check   Hospital visits:  None in previous 6 months  Objective:  Lab Results  Component Value Date   CREATININE 1.32 (H)  12/29/2019   BUN 30 (H) 12/29/2019   GFR 39.80 (L) 12/29/2019   GFRNONAA 47 (L) 08/12/2019   GFRAA 55 (L) 08/12/2019   NA 140 12/29/2019   K 4.0 12/29/2019   CALCIUM 9.7 12/29/2019   CO2 23 12/29/2019   GLUCOSE 89 12/29/2019    Lab Results  Component Value Date/Time   HGBA1C 6.1 12/29/2019 10:09 AM   HGBA1C 6.4 (A) 06/30/2019 09:34 AM   HGBA1C 9.0 (A) 03/24/2019 09:09 AM   HGBA1C 8.0 (H) 07/29/2018 09:47 AM   GFR 39.80 (L) 12/29/2019 10:09 AM   GFR 54.98 (L) 07/29/2018 09:47 AM   MICROALBUR 1.5 12/29/2019 10:09 AM   MICROALBUR 1.3 07/29/2018 09:47 AM    Last diabetic Eye exam:  Lab Results  Component Value Date/Time   HMDIABEYEEXA No Retinopathy 01/23/2018 12:00 AM    Last diabetic Foot exam:  01/05/2020 with PCP visit   CHADSVASc Score of 6 ( age -19, HTN-1, TIA/CVA-2, DM-1, Gender-1)   Lab Results  Component Value Date   CHOL 232 (H) 12/29/2019   HDL 41.90 12/29/2019   LDLCALC 89 04/16/2017   LDLDIRECT 139.0 12/29/2019   TRIG 228.0 (H) 12/29/2019   CHOLHDL 6 12/29/2019    Hepatic Function Latest Ref Rng & Units 12/29/2019 07/29/2018 04/16/2017  Total Protein 6.0 - 8.3 g/dL 7.0 6.5 7.1  Albumin 3.5 - 5.2 g/dL 4.4 4.3 4.1  AST 0 - 37 U/L _0 ALT 0 - 35 U/L _1 Alk Phosphatase 39 - 117 U/L 65 78 84  Total Bilirubin 0.2 - 1.2 mg/dL 0.6 0.6  0.8  Bilirubin, Direct 0.0 - 0.3 mg/dL 0.1 0.1 0.1    Lab Results  Component Value Date/Time   TSH 2.27 07/29/2018 09:47 AM   TSH 1.78 04/16/2017 11:23 AM   FREET4 1.15 10/15/2012 11:55 AM    CBC Latest Ref Rng & Units 12/29/2019 08/12/2019 07/29/2018  WBC 4.0 - 10.5 K/uL 5.7 4.9 7.2  Hemoglobin 12.0 - 15.0 g/dL 12.4 13.9 14.3  Hematocrit 36.0 - 46.0 % 37.3 42.9 43.2  Platelets 150.0 - 400.0 K/uL 226.0 196 227.0    Lab Results  Component Value Date/Time   VD25OH 28.65 (L) 12/29/2019 10:09 AM   Clinical ASCVD: Yes  The ASCVD Risk score Mikey Bussing DC Jr., et al., 2013) failed to calculate for the following reasons:    The patient has a prior MI or stroke diagnosis    Depression screen [facility] 2/9 01/05/2020 07/15/2018 04/16/2017  Decreased Interest 0 0 0  Down, Depressed, Hopeless 0 0 0  PHQ - 2 Score 0 0 0  Altered sleeping - 0 0  Tired, decreased energy - 0 0  Change in appetite - 0 0  Feeling bad or failure about yourself  - 0 0  Trouble concentrating - 0 0  Moving slowly or fidgety/restless - 0 0  Suicidal thoughts - 0 0  PHQ-9 Score - 0 0  Difficult doing work/chores - Not difficult at all Not difficult at all  Some recent data might be hidden    Social History   Tobacco Use  Smoking Status Former Smoker  . Packs/day: 2.00  . Types: Cigarettes  . Quit date: 01/24/1968  . Years since quitting: 52.2  Smokeless Tobacco Never Used   BP Readings from Last 3 Encounters:  04/12/20 111/66  01/05/20 (!) 160/78  08/12/19 (!) 150/72   Pulse Readings from Last 3 Encounters:  04/12/20 65  01/05/20 (!) 51  08/12/19 66   Wt Readings from Last 3 Encounters:  01/05/20 201 lb (91.2 kg)  08/12/19 201 lb (91.2 kg)  07/10/19 200 lb (90.7 kg)   BMI Readings from Last 3 Encounters:  01/05/20 37.98 kg/m  08/12/19 36.76 kg/m  07/10/19 35.43 kg/m    Assessment/Interventions: Review of patient past medical history, allergies, medications, health status, including review of consultants reports, laboratory and other test data, was performed as part of comprehensive evaluation and provision of chronic care management services.   SDOH:  (Social Determinants of Health) assessments and interventions performed: Yes SDOH Interventions   Flowsheet Row Most Recent Value  SDOH Interventions   Financial Strain Interventions Intervention Not Indicated  [Medications affordable]      CCM Care Plan  Allergies  Allergen Reactions  . Bee Venom Anaphylaxis  . Amiodarone Itching  . Crestor [Rosuvastatin]     myalgia  . Fish Allergy Swelling    Shell fish  . Iodine Itching    itching  . Lipitor  [Atorvastatin]     myalgias  . Penicillins Hives and Itching  . Ace Inhibitors Cough  . Lidocaine Hcl Palpitations    ALL CAINES given during dental procedures    Medications Reviewed Today    Reviewed by Debbora Dus, [facility] (Pharmacist) on 04/12/20 at 1308  Med List Status: <None>  Medication Order Taking? Sig Documenting Provider Last Dose Status Informant  Accu-Chek FastClix Lancets MISC [id number] Yes USE TO CHECK FBS AND 2 HR PP ONCE EVERY 2 WEEKS [name], Spencer, MD Taking Active   Blood Glucose Calibration (ACCU-CHEK GUIDE CONTROL) LIQD [id number] Yes 1 each  by In Vitro route as directed. [name], Frederico Hamman, MD Taking Active   Blood Glucose Monitoring Suppl (ACCU-CHEK GUIDE ME) w/Device KIT [id number] Yes 1 each by Does not apply route every 14 (fourteen) days. Use to check FBS and 2 HR PP once every 2 weeks [name], Spencer, MD Taking Active   ELIQUIS 5 MG TABS tablet [id number] Yes TAKE 1 TABLET(5 MG) BY MOUTH TWICE DAILY Deboraha Sprang, MD Taking Active   glipiZIDE (GLUCOTROL XL) 10 MG 24 hr tablet [id number] Yes TAKE 2 TABLETS BY MOUTH EVERY DAY WITH BREAKFAST [name], Spencer, MD Taking Active   glucose blood (ACCU-CHEK GUIDE) test strip [id number] Yes Use to check FBS and 2 HR PP once every 2 weeks [name], Spencer, MD Taking Active   hydrochlorothiazide (HYDRODIURIL) 25 MG tablet [id number] Yes TAKE 1 TABLET(25 MG) BY MOUTH DAILY [name], Spencer, MD Taking Active   losartan (COZAAR) 100 MG tablet [id number] Yes TAKE 1 TABLET(100 MG) BY MOUTH DAILY [name], Spencer, MD Taking Active   metFORMIN (GLUCOPHAGE-XR) 500 MG 24 hr tablet [id number] Yes TAKE 2 TABLETS(1000 MG) BY MOUTH DAILY WITH BREAKFAST [name], Spencer, MD Taking Active   metoprolol tartrate (LOPRESSOR) 100 MG tablet [id number] Yes TAKE 1 TABLET(100 MG) BY MOUTH TWICE DAILY [name], Spencer, MD Taking Active   mometasone (NASONEX) 50 MCG/ACT nasal spray [id number] Yes Place 2 sprays into the nose daily as needed. [provider] Taking Active   montelukast (SINGULAIR) 10 MG tablet [id number] Yes TAKE 1 TABLET BY MOUTH AT BEDTIME  Patient taking differently: PRN   [name], Frederico Hamman, MD Taking Active Self  pioglitazone (ACTOS) 30 MG tablet [id number] Yes TAKE 1 TABLET(30 MG) BY MOUTH DAILY [name], Spencer, MD Taking Active   rosuvastatin (CRESTOR) 5 MG tablet [id number] No Take 1 tablet (5 mg total) by mouth daily.  Patient not taking: Reported on 04/12/2020   [name], MD Not Taking Active   sitaGLIPtin (JANUVIA) 100 MG tablet [id number] Yes TAKE 1 TABLET(100 MG) BY MOUTH DAILY [name], Spencer, MD Taking Active   triamcinolone (KENALOG) 0.1 % [id number] Yes APPLY TOPICALLY TWICE DAILY [name], Spencer, MD Taking Active           Patient Active Problem List   Diagnosis Date Noted  . Osteoarthritis of right knee 06/10/2019  . OSA (obstructive sleep apnea) 05/25/2014  . Chronic anticoagulation- Eliquis 01/27/2013  . History of stroke 01/06/2013  . Cardiac pacemaker in situ 01/06/2013  . History of noncompliance with medical treatment 01/06/2013  . Atrial fibrillation ([place]) 12/29/2012  . Tachy-brady syndrome ([place]) 12/29/2012  . Hx of rheumatic fever 08/07/2011  . NEURALGIA, TRIGEMINAL 01/26/2010  . Type 2 diabetes mellitus with vascular disease ([place]) 05/04/2008  . Hyperlipidemia LDL goal <70 05/04/2008  . Essential hypertension 05/04/2008  . ALLERGIC RHINITIS 05/04/2008  . ASTHMA 05/04/2008  . GERD 05/04/2008  . Asthma 05/04/2008    Immunization History  Administered Date(s) Administered  . Fluad Quad(high Dose 65+) 01/05/2020  . Influenza Split 11/13/2011  . Influenza, High Dose Seasonal PF 11/23/2016  . Influenza,inj,Quad PF,6+ Mos 11/18/2012, 10/06/2013, 12/23/2014, 03/20/2016, 01/28/2018  . PFIZER(Purple Top)SARS-COV-2 Vaccination 03/07/2019, 04/01/2019  . Pneumococcal Conjugate-13 10/06/2013  . Pneumococcal Polysaccharide-23 10/11/2010    Conditions to be addressed/monitored:   Hypertension, Hyperlipidemia, Diabetes, Atrial Fibrillation, Coronary Artery Disease and Asthma  Care Plan : [place]  Updates made by Debbora Dus, [facility] since 04/12/2020 12:00 AM    Problem: Disease Management   Priority: High  Onset Date: 04/12/2020  Note:  Current Barriers:  . Unable to tolerate statins  Pharmacist Clinical Goal(s):  Marland Kitchen Patient will contact provider office for questions/concerns as evidenced notation of same in electronic health record through collaboration with PharmD and provider.   Interventions: . 1:1 collaboration with [name], MD regarding development and update of comprehensive plan of care as evidenced by provider attestation and co-signature . Inter-disciplinary care team collaboration (see longitudinal plan of care) . Comprehensive medication review performed; medication list updated in electronic medical record  Hypertension (BP goal <130/80) -Controlled -Current treatment: . HCTZ 25 mg - 1 tablet daily . Losartan 100 mg - 1 tablet daily  . Metoprolol tartrate 100 mg - 1 tablet BID  -Medications previously tried: none  -She uses an automatic home monitor with arm cuff, checking several days per week -Current home readings: 128/71, 72 repeat check 111/66, 65 (this morning 3/21) -Denies hypotensive/hypertensive symptoms -Educated on Importance of home blood pressure monitoring; Proper BP monitoring technique; Reviewed kidney function decline. Recommended adequate hydration. Avoid NSAIDs (Advil, Goody powder, Aleve). -Counseled to monitor BP at home weekly, document, and provide log at future appointments -Recommended to continue current medication  Hyperlipidemia: (LDL goal < 70) -Uncontrolled - currently off statin for 1 week  -Current treatment: . Rosuvastatin 5 mg - 1 tablet daily (started 12/2019) -Medications previously tried: simvastatin - knee pain -Reports shoulder pain with rosuvastatin. Stopped 1 week ago. She  prefers to go back on simvastatin. She still has some 20 mg tablets at home.  -Educated on Cholesterol goals;  Benefits of statin for ASCVD risk reduction; Strategies to manage statin-induced myalgias; Importance of Vitamin D  -She will try simvastatin again. Remain off rosuvastatin. Start Vitamin D3 1000 IU daily.   Diabetes (A1c goal <7%) -Controlled - A1c 6.1% -Current medications: . Januvia 100 mg - 1 tablet daily . Actos 30 mg - 1 tablet daily  . Metformin 500 mg XR - 2 tablets (1000 mg) with breakfast . Glipizide 10 mg XL - 2 tablets daily with breakfast -Medications previously tried: none  -Current home glucose readings - does not routinely monitor, checks occasionally . fasting glucose: n/a . post prandial glucose: n/a -Denies hypoglycemic/hyperglycemic symptoms -Current meal patterns: Loves vegetables, fruits, lean meats. Avoids processed foods. Drinks - water and club soda, no sugary drinks, encouraged to stay hydrated  -Current exercise: yard work  -Patient interested in cutting back on medications. We discussed importance of checking BG first to evaluate. Discussed limitations of A1c to provide whole picture.  -Educated on A1c and blood sugar goals; Benefits of routine self-monitoring of blood sugar; -Counseled to check feet daily and get yearly eye exams -Pt reports feet checked with last PCP visit. Encouraged her to schedule eye exam. -Recommended to continue current medication; Check fasting BG (empty stomach) and 2 hours after meals for next 7 days. Follow up call next Tuesday.   Atrial Fibrillation (Goal: prevent stroke and major bleeding) -Controlled -CHADSVASC: 6 -Current treatment: . Rate control: Metoprolol tartrate 100 mg - 1 tablet BID . Anticoagulation: Eliquis 5 mg - 1 tablet BID  -Medications previously tried: none -Eliquis dosing appropriate for age, Scr, and weight. -Counseled on increased risk of stroke due to Afib and benefits of anticoagulation for  stroke prevention; importance of adherence to anticoagulant exactly as prescribed; -Recommended to continue current medication  Asthma (Goal: Control symptoms) -Controlled -Current treatment  . Mometasone 50 mcg/act nasal spray - 2 spray daily PRN . Singulair 10 mg - 1 tablet daily at bedtime PRN  .  Saline Nasal Spray - PRN -Medications previously tried: Flonase - not effective  Reports symptoms well controlled with PRN use  -Recommended to continue current medication   Patient Goals/Self-Care Activities . Patient will:  - take medications as prescribed check glucose daily, document, and provide at future appointments  Follow Up Plan: The care management team will reach out to the patient again over the next 7 days.      Medication Assistance: None required.  Patient affirms current coverage meets needs.  Patient's preferred pharmacy is: [facility] [place], [place] - [place] [address] [place] [address] Phone: [phone] Fax: [phone]  Uses pill box? No - able to state names and timing of medications. She has a good routine. Pt endorses compliance. She turns the bottles upside down to confirm she took them.  Takes all in AM and Eliquis and metoprolol again in evening.   We discussed: Current pharmacy is preferred with insurance plan and patient is satisfied with pharmacy services Patient decided to: Continue current medication management strategy  Care Plan and Follow Up Patient Decision:  Patient agrees to Care Plan and Follow-up.  Debbora Dus, PharmD Clinical Pharmacist [facility] at [facility] [phone]

## 2020-04-12 NOTE — Telephone Encounter (Signed)
Pt's age 75, wt 91.2 kg, SCr 1.32, CrCl 53.83, last ov w/ SK 08/12/19.

## 2020-04-16 ENCOUNTER — Telehealth: Payer: Self-pay

## 2020-04-16 NOTE — Telephone Encounter (Addendum)
Patient requested a call. Spoke with her at 59 AM on 04/16/20. She began checking BG twice daily after our appt this week. She reports fasting BG was 63 this morning - some shakiness. She ate breakfast and 2 hours later BG was 152. She did not have any carbs the day before so she thinks this may have contributed to the low. Counseled on rule of 15, prevention of hypoglycemia. CMA will review BG log on Tuesday 04/20/20 at 1 PM as previously planned. Pt to call if any episodes of hypoglycemia.   Debbora Dus, PharmD Clinical Pharmacist [facility] at [facility] [phone]

## 2020-04-16 NOTE — Telephone Encounter (Signed)
Patient called into the triage line and was requesting to speak with Debbora Dus. Sending to her to advise.

## 2020-04-19 NOTE — Telephone Encounter (Signed)
Spoke with patient on 3/25, see telephone note.

## 2020-04-20 ENCOUNTER — Telehealth: Payer: Self-pay

## 2020-04-20 ENCOUNTER — Other Ambulatory Visit: Payer: Self-pay | Admitting: Family Medicine

## 2020-04-20 NOTE — Chronic Care Management (AMB) (Addendum)
Chronic Care Management Pharmacy Assistant   Name: Nina Carpenter  MRN: [id number] DOB: 12-Jun-1945   Reason for Encounter   Disease State -  BG log  Recent office visits: None since last CCM call  Recent consult visits:   None since last CCM call  Hospital visits:  None in previous 6 months  Medications: Outpatient Encounter Medications as of 04/20/2020  Medication Sig   Accu-Chek FastClix Lancets MISC USE TO CHECK FBS AND 2 HR PP ONCE EVERY 2 WEEKS   Blood Glucose Calibration (ACCU-CHEK GUIDE CONTROL) LIQD 1 each by In Vitro route as directed.   Blood Glucose Monitoring Suppl (ACCU-CHEK GUIDE ME) w/Device KIT 1 each by Does not apply route every 14 (fourteen) days. Use to check FBS and 2 HR PP once every 2 weeks   ELIQUIS 5 MG TABS tablet TAKE 1 TABLET(5 MG) BY MOUTH TWICE DAILY   glipiZIDE (GLUCOTROL XL) 10 MG 24 hr tablet TAKE 2 TABLETS BY MOUTH EVERY DAY WITH BREAKFAST   glucose blood (ACCU-CHEK GUIDE) test strip Use to check FBS and 2 HR PP once every 2 weeks   hydrochlorothiazide (HYDRODIURIL) 25 MG tablet TAKE 1 TABLET(25 MG) BY MOUTH DAILY   losartan (COZAAR) 100 MG tablet TAKE 1 TABLET(100 MG) BY MOUTH DAILY   metFORMIN (GLUCOPHAGE-XR) 500 MG 24 hr tablet TAKE 2 TABLETS(1000 MG) BY MOUTH DAILY WITH BREAKFAST   metoprolol tartrate (LOPRESSOR) 100 MG tablet TAKE 1 TABLET(100 MG) BY MOUTH TWICE DAILY   mometasone (NASONEX) 50 MCG/ACT nasal spray Place 2 sprays into the nose daily as needed.   montelukast (SINGULAIR) 10 MG tablet TAKE 1 TABLET BY MOUTH AT BEDTIME (Patient taking differently: PRN)   pioglitazone (ACTOS) 30 MG tablet TAKE 1 TABLET(30 MG) BY MOUTH DAILY   rosuvastatin (CRESTOR) 5 MG tablet Take 1 tablet (5 mg total) by mouth daily. (Patient not taking: Reported on 04/12/2020)   sitaGLIPtin (JANUVIA) 100 MG tablet TAKE 1 TABLET(100 MG) BY MOUTH DAILY   triamcinolone (KENALOG) 0.1 % APPLY TOPICALLY TWICE DAILY   No facility-administered encounter medications  on file as of 04/20/2020.   Recent Relevant Labs: Lab Results  Component Value Date/Time   HGBA1C 6.1 12/29/2019 10:09 AM   HGBA1C 6.4 (A) 06/30/2019 09:34 AM   HGBA1C 9.0 (A) 03/24/2019 09:09 AM   HGBA1C 8.0 (H) 07/29/2018 09:47 AM   MICROALBUR 1.5 12/29/2019 10:09 AM   MICROALBUR 1.3 07/29/2018 09:47 AM    Kidney Function Lab Results  Component Value Date/Time   CREATININE 1.32 (H) 12/29/2019 10:09 AM   CREATININE 1.14 (H) 08/12/2019 09:33 AM   CREATININE 0.97 05/09/2013 08:23 PM   GFR 39.80 (L) 12/29/2019 10:09 AM   GFRNONAA 47 (L) 08/12/2019 09:33 AM   GFRNONAA >60 05/09/2013 08:23 PM   GFRAA 55 (L) 08/12/2019 09:33 AM   GFRAA >60 05/09/2013 08:23 PM    Current antihyperglycemic regimen:  Januvia 100 mg - 1 tablet daily Actos 30 mg - 1 tablet daily  Metformin 500 mg XR - 2 tablets (1000 mg) with breakfast Glipizide 10 mg XL - 2 tablets daily with breakfast  What recent interventions/DTPs have been made to improve glycemic control:  Check fasting BG (empty stomach) and 2 hours after meals for next 7 days  Have there been any recent hospitalizations or ED visits since last visit with CPP? No   Patient denies hypoglycemic symptoms, including Pale, Sweaty, Shaky, Hungry, Nervous/irritable and Vision changes   Patient denies hyperglycemic symptoms, including blurry vision,  excessive thirst, fatigue, polyuria and weakness   How often are you checking your blood sugar? once daily    What are your blood sugars ranging? Pt was in car and did not have her full BG log available.  Fasting:  3/21- 140 3/25- 152 3/29- 110 Before meals:  N/A After meals: 04/15/2020  124  Bedtime:  N/A  During the week, how often does your blood glucose drop below 70? Never - one episode last week which was discussed with Debbora Dus, none since then  Adherence Review: Is the patient currently on a STATIN medication? Yes Is the patient currently on ACE/ARB medication? Yes Does the  patient have >5 day gap between last estimated fill dates? No   Star Rating Drugs: Glipizide 10 mg. 3/12//2022  90 DS losartan100 mg  04/12/2020  90DS Metformin XR 500 mg. 04/05/2020  90 DS  Follow-Up:  Pharmacist Review  Debbora Dus, CPP notified  Margaretmary Dys, [place] Assistant [phone]  I have reviewed the care management and care coordination activities outlined in this encounter and I am certifying that I agree with the content of this note. No further action required.  Debbora Dus, PharmD Clinical Pharmacist [place] [facility] at [facility] [phone]

## 2020-04-27 ENCOUNTER — Ambulatory Visit (INDEPENDENT_AMBULATORY_CARE_PROVIDER_SITE_OTHER): Payer: Medicare Other

## 2020-04-27 DIAGNOSIS — I495 Sick sinus syndrome: Secondary | ICD-10-CM

## 2020-04-29 ENCOUNTER — Ambulatory Visit: Payer: Medicare Other | Admitting: Gastroenterology

## 2020-05-01 LAB — CUP PACEART REMOTE DEVICE CHECK
Battery Remaining Longevity: 38 mo
Battery Voltage: 2.96 V
Brady Statistic AP VP Percent: 0.05 %
Brady Statistic AP VS Percent: 46.56 %
Brady Statistic AS VP Percent: 0.03 %
Brady Statistic AS VS Percent: 53.36 %
Brady Statistic RA Percent Paced: 46.56 %
Brady Statistic RV Percent Paced: 0.08 %
Date Time Interrogation Session: 20220408082944
Implantable Lead Implant Date: 20141208
Implantable Lead Implant Date: 20141208
Implantable Lead Location: 753859
Implantable Lead Location: 753860
Implantable Lead Model: 5076
Implantable Lead Model: 5076
Implantable Pulse Generator Implant Date: 20141208
Lead Channel Impedance Value: 323 Ohm
Lead Channel Impedance Value: 342 Ohm
Lead Channel Impedance Value: 361 Ohm
Lead Channel Impedance Value: 380 Ohm
Lead Channel Pacing Threshold Amplitude: 0.625 V
Lead Channel Pacing Threshold Amplitude: 0.75 V
Lead Channel Pacing Threshold Pulse Width: 0.4 ms
Lead Channel Pacing Threshold Pulse Width: 0.4 ms
Lead Channel Sensing Intrinsic Amplitude: 1.75 mV
Lead Channel Sensing Intrinsic Amplitude: 1.75 mV
Lead Channel Sensing Intrinsic Amplitude: 5.375 mV
Lead Channel Sensing Intrinsic Amplitude: 5.375 mV
Lead Channel Setting Pacing Amplitude: 2 V
Lead Channel Setting Pacing Amplitude: 2.5 V
Lead Channel Setting Pacing Pulse Width: 0.4 ms
Lead Channel Setting Sensing Sensitivity: 2 mV

## 2020-05-10 NOTE — Progress Notes (Signed)
Remote pacemaker transmission.   

## 2020-05-18 DIAGNOSIS — M7582 Other shoulder lesions, left shoulder: Secondary | ICD-10-CM | POA: Diagnosis not present

## 2020-05-20 ENCOUNTER — Other Ambulatory Visit: Payer: Self-pay | Admitting: Family Medicine

## 2020-05-20 DIAGNOSIS — E1159 Type 2 diabetes mellitus with other circulatory complications: Secondary | ICD-10-CM

## 2020-06-08 ENCOUNTER — Encounter: Payer: Self-pay | Admitting: Gastroenterology

## 2020-06-08 ENCOUNTER — Telehealth: Payer: Self-pay

## 2020-06-08 ENCOUNTER — Ambulatory Visit (INDEPENDENT_AMBULATORY_CARE_PROVIDER_SITE_OTHER): Payer: Medicare Other | Admitting: Gastroenterology

## 2020-06-08 VITALS — BP 138/70 | HR 88 | Ht 62.0 in | Wt 204.0 lb

## 2020-06-08 DIAGNOSIS — Z01818 Encounter for other preprocedural examination: Secondary | ICD-10-CM | POA: Diagnosis not present

## 2020-06-08 DIAGNOSIS — Z86010 Personal history of colonic polyps: Secondary | ICD-10-CM

## 2020-06-08 DIAGNOSIS — Z7901 Long term (current) use of anticoagulants: Secondary | ICD-10-CM

## 2020-06-08 MED ORDER — NA SULFATE-K SULFATE-MG SULF 17.5-3.13-1.6 GM/177ML PO SOLN: 1.0000 | Freq: Once | ORAL | 0 refills | Status: AC

## 2020-06-08 NOTE — Telephone Encounter (Signed)
[facility] Pre-operative Risk Assessment     Request for surgical clearance:     Endoscopy Procedure  What type of surgery is being performed?     colonoscopy  When is this surgery scheduled?     08/12/20  What type of clearance is required ?   Pharmacy  Are there any medications that need to be held prior to surgery and how long? Eliquis x 2 days  Practice name and name of physician performing surgery?      [facility]  What is your office phone and fax number?      Phone- [phone]  [phone]  Anesthesia type (None, local, MAC, general) ?       MAC

## 2020-06-08 NOTE — Progress Notes (Signed)
History of Present Illness: This is a 75 year old female referred by [name], MD for the evaluation of personal history of adenomatous colon polyps. Colonoscopy in May 2010 showed 2 small polyps in the transverse colon and mild sigmoid colon diverticulosis. Path 1 tubular adenoma and one benign hamartomatous polyp.  She has no ongoing gastrointestinal complaints. Denies weight loss, abdominal pain, constipation, diarrhea, change in stool caliber, melena, hematochezia, nausea, vomiting, dysphagia, reflux symptoms, chest pain.   Allergies  Allergen Reactions  . Bee Venom Anaphylaxis  . Amiodarone Itching  . Crestor [Rosuvastatin]     myalgia  . Fish Allergy Swelling    Shell fish  . Iodine Itching    itching  . Lipitor [Atorvastatin]     myalgias  . Penicillins Hives and Itching  . Ace Inhibitors Cough  . Lidocaine Hcl Palpitations    ALL CAINES given during dental procedures   Outpatient Medications Prior to Visit  Medication Sig Dispense Refill  . Accu-Chek FastClix Lancets MISC USE TO CHECK FBS AND 2 HR PP ONCE EVERY 2 WEEKS 102 each 0  . Blood Glucose Calibration (ACCU-CHEK GUIDE CONTROL) LIQD 1 each by In Vitro route as directed. 1 each 0  . Blood Glucose Monitoring Suppl (ACCU-CHEK GUIDE ME) w/Device KIT 1 each by Does not apply route every 14 (fourteen) days. Use to check FBS and 2 HR PP once every 2 weeks 1 kit 0  . ELIQUIS 5 MG TABS tablet TAKE 1 TABLET(5 MG) BY MOUTH TWICE DAILY 60 tablet 5  . glipiZIDE (GLUCOTROL XL) 10 MG 24 hr tablet TAKE 2 TABLETS BY MOUTH EVERY DAY WITH BREAKFAST 180 tablet 1  . glucose blood (ACCU-CHEK GUIDE) test strip USE AS DIRECTED TO CHECK FASTING BLOOD SUGAR AND 2 HOURS POST-PRANDIAL ONCE EVERY 2 WEEKS 50 strip 0  . hydrochlorothiazide (HYDRODIURIL) 25 MG tablet TAKE 1 TABLET(25 MG) BY MOUTH DAILY 30 tablet 5  . losartan (COZAAR) 100 MG tablet TAKE 1 TABLET(100 MG) BY MOUTH DAILY 90 tablet 1  . metFORMIN (GLUCOPHAGE-XR) 500 MG 24 hr  tablet TAKE 2 TABLETS(1000 MG) BY MOUTH DAILY WITH BREAKFAST 60 tablet 5  . metoprolol tartrate (LOPRESSOR) 100 MG tablet TAKE 1 TABLET(100 MG) BY MOUTH TWICE DAILY 180 tablet 1  . mometasone (NASONEX) 50 MCG/ACT nasal spray Place 2 sprays into the nose daily as needed.    . montelukast (SINGULAIR) 10 MG tablet TAKE 1 TABLET BY MOUTH AT BEDTIME (Patient taking differently: PRN) 90 tablet 3  . pioglitazone (ACTOS) 30 MG tablet TAKE 1 TABLET(30 MG) BY MOUTH DAILY 90 tablet 1  . sitaGLIPtin (JANUVIA) 100 MG tablet TAKE 1 TABLET(100 MG) BY MOUTH DAILY 90 tablet 3  . triamcinolone (KENALOG) 0.1 % APPLY TOPICALLY TWICE DAILY 454 g 0  . rosuvastatin (CRESTOR) 5 MG tablet Take 1 tablet (5 mg total) by mouth daily. (Patient not taking: Reported on 04/12/2020) 30 tablet 3   No facility-administered medications prior to visit.   Past Medical History:  Diagnosis Date  . Acid reflux disease   . Asthma    a. Remotely.  . Chronic combined systolic and diastolic CHF (congestive heart failure) ([place])    a. Felt related to AF. EF 45-50% by echo 12/2012.  Marland Kitchen CVA (cerebral vascular accident) ([place])    a. Thromboemoblic L MCA CVA 10/2723 with no residual deficit except mild occasional word finding. b. 3-66% RICA/LICA by dopplers 44/0/34.  Marland Kitchen GERD (gastroesophageal reflux disease)   . H/O medication noncompliance  a. Per PCP note 01/06/13: Previously refused anything greater than aspirin, refused treatment for DM, and often stopped hypertensive meds and lipid meds.  Marland Kitchen Hx of rheumatic fever    a. As a child.  . Hyperlipidemia   . Hypertension   . Kidney stones    a. Remotely.  Marland Kitchen PAF (paroxysmal atrial fibrillation) ([place])    a. Dx 09/2012, chronicity unclear at that time. Initially refused Xarelto due to risk of bleeding. b. Sustained CVA 12/2012 - agreeable to taking Eliquis. Tachybrady syndrome during that admission s/p Medtronic PPM implantation;  c. Failed flecainide, sotalol, amiodarone (itching), and  propafenone.  . Tachy-brady syndrome ([place])    a. AF RVR with pauses >5sec requiring Medtronic PPM 12/2012.  . Tubular adenoma of colon 05/2008  . Type 2 diabetes mellitus ([place])    Past Surgical History:  Procedure Laterality Date  . CHOLECYSTECTOMY    . DESTRUCTION TRIGEMINAL NERVE VIA NEUROLYTIC AGENT  NOv @013    Gamma Knife   . ENDOMETRIAL ABLATION    . INSERT / REPLACE / REMOVE PACEMAKER    . PACEMAKER INSERTION  12-30-12   MDT dual chamber pacemaker implanted by Dr Caryl Comes for tachy-brady syndrome  . PERMANENT PACEMAKER INSERTION N/A 12/30/2012   Procedure: PERMANENT PACEMAKER INSERTION;  Surgeon: Deboraha Sprang, MD;  Location: [facility] CATH LAB;  Service: Cardiovascular;  Laterality: N/A;  . TUBAL LIGATION     ectopic preganacy    Social History   Socioeconomic History  . Marital status: Married    Spouse name: Not on file  . Number of children: 2  . Years of education: Not on file  . Highest education level: Not on file  Occupational History  . Occupation: Paediatric nurse: SELF EMPLOYED  Tobacco Use  . Smoking status: Former Smoker    Packs/day: 2.00    Types: Cigarettes    Quit date: 01/24/1968    Years since quitting: 52.4  . Smokeless tobacco: Never Used  Vaping Use  . Vaping Use: Never used  Substance and Sexual Activity  . Alcohol use: Yes    Alcohol/week: 1.0 standard drink    Types: 1 Glasses of wine per week    Comment: rare use  . Drug use: No  . Sexual activity: Yes    Birth control/protection: Post-menopausal  Other Topics Concern  . Not on file  Social History Narrative   Lives in [place] with her husband.  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   Social Determinants of Health   Financial Resource Strain: Low Risk   . Difficulty of Paying Living Expenses: Not very hard  Food Insecurity: Not on file  Transportation Needs: Not on file  Physical Activity: Not on file  Stress: Not on file  Social Connections: Not on file   Family  History  Problem Relation Age of Onset  . Stroke Mother   . Diabetes Mother   . Hypertension Mother      Review of Systems: Pertinent positive and negative review of systems were noted in the above HPI section. All other review of systems were otherwise negative.   Physical Exam: General: Well developed, well nourished, no acute distress Head: Normocephalic and atraumatic Eyes: Sclerae anicteric, EOMI Ears: Normal auditory acuity Mouth: Not examined, mask on during Covid-19 pandemic Neck: Supple, no masses or thyromegaly Lungs: Clear throughout to auscultation Heart: Regular rate and rhythm; no murmurs, rubs or bruits Abdomen: Soft, non tender and non distended. No masses, hepatosplenomegaly or  hernias noted. Normal Bowel sounds Rectal: Deferred to colonoscopy  Musculoskeletal: Symmetrical with no gross deformities  Skin: No lesions on visible extremities Pulses:  Normal pulses noted Extremities: No clubbing, cyanosis, edema or deformities noted Neurological: Alert oriented x 4, grossly nonfocal Cervical Nodes:  No significant cervical adenopathy Inguinal Nodes: No significant inguinal adenopathy Psychological:  Alert and cooperative. Normal mood and affect   Assessment and Recommendations:  1.  Personal history of adenomatous colon polyps.  She is overdue for colonoscopy.  Schedule colonoscopy. The risks (including bleeding, perforation, infection, missed lesions, medication reactions and possible hospitalization or surgery if complications occur), benefits, and alternatives to colonoscopy with possible biopsy and possible polypectomy were discussed with the patient and they consent to proceed.   2.  History of atrial fibrillation.  History of tachybradycardia syndrome.  Status post pacemaker placement.  History of CVA. Hold Eliquis 2 days before procedure - will instruct when and how to resume after procedure. Low but real risk of cardiovascular event such as heart attack,  stroke, embolism, thrombosis or ischemia/infarct of other organs off Eliquis explained and need to seek urgent help if this occurs. The patient consents to proceed. Will communicate by phone or EMR with patient's prescribing provider to confirm that holding Eliquis is reasonable in this case.     cc: [name], MD [address] [place],  [place] [address]

## 2020-06-08 NOTE — Patient Instructions (Signed)
You have been scheduled for a colonoscopy. Please follow written instructions given to you at your visit today.  Please pick up your prep supplies at the pharmacy within the next 1-3 days. If you use inhalers (even only as needed), please bring them with you on the day of your procedure.  Normal BMI (Body Mass Index- based on height and weight) is between 23 and 30. Your BMI today is Body mass index is 37.31 kg/m. Marland Kitchen Please consider follow up  regarding your BMI with your Primary Care Provider.  Thank you for choosing me and [facility].  Pricilla Riffle. Dagoberto Ligas., MD., Marval Regal

## 2020-06-09 NOTE — Telephone Encounter (Signed)
Patient with diagnosis of atrial fibrillation on Eliquis for anticoagulation.    Procedure: colonoscopy Date of procedure: 08/12/2020   CHA2DS2-VASc Score = 6  This indicates a 9.7% annual risk of stroke. The patient's score is based upon: CHF History: No HTN History: Yes Diabetes History: Yes Stroke History: Yes Vascular Disease History: No Age Score: 1 Gender Score: 1  CrCl 54.6 (29.6 with IBW) Platelet count 226  Per chart patient has hx of thromboembolic CVA Dec 2094, 3 months after AF diagnosis, was not on anticoagulation at that time.  Also, note patient will turn 75 prior to colonoscopy, increasing CHADS2-VASc score to 7 at time of procedure.   Would recommend that is okay to hold Eliquis x 2 days prior to procedure without bridge, but will defer to primary cardiologist for final decision.

## 2020-06-09 NOTE — Telephone Encounter (Signed)
Will route to PharmD for rec's re: holding anticoagulation. Richardson Dopp, PA-C    06/09/2020 1:36 PM

## 2020-06-14 NOTE — Telephone Encounter (Signed)
Left message for patient to return my call.

## 2020-06-14 NOTE — Telephone Encounter (Signed)
Reviewing Dr Silvio Pate notes, he went over with her the risks, given her own hx of adenomatous polyps and her known hx of CVA so agree with plan as outlined

## 2020-06-15 NOTE — Telephone Encounter (Signed)
Informed patient per cardiology to hold Eliquis 2 days prior to her procedure. Patient verbalized understanding.

## 2020-06-17 ENCOUNTER — Other Ambulatory Visit: Payer: Self-pay | Admitting: Family Medicine

## 2020-06-17 DIAGNOSIS — E1159 Type 2 diabetes mellitus with other circulatory complications: Secondary | ICD-10-CM

## 2020-07-04 NOTE — Progress Notes (Signed)
Aviannah Castoro T. Blanchard Willhite, MD, [place] at [facility] [place], [address]  Phone: [phone]  FAX: [phone]  Nina Carpenter - 75 y.o. female  MRN [id number]  Date of Birth: 06/26/1945  Date: 07/05/2020  PCP: [name], MD  Referral: [name], MD  Chief Complaint  Patient presents with   Diabetes    This visit occurred during the SARS-CoV-2 public health emergency.  Safety protocols were in place, including screening questions prior to the visit, additional usage of staff PPE, and extensive cleaning of exam room while observing appropriate contact time as indicated for disinfecting solutions.   Subjective:   Nina Carpenter is a 75 y.o. very pleasant female patient with Body mass index is 38.07 kg/m. who presents with the following:  She has done much better in recent years with good compliance with all of her medication particularly post-stroke.  Health Maintenance:   Colon - next month Mammo - did one last year.  DEXA Eye exam Covid #3, declined  Itching.   BP: 148/68  Immunization History  Administered Date(s) Administered   Fluad Quad(high Dose 65+) 01/05/2020   Influenza Split 11/13/2011   Influenza, High Dose Seasonal PF 11/23/2016   Influenza,inj,Quad PF,6+ Mos 11/18/2012, 10/06/2013, 12/23/2014, 03/20/2016, 01/28/2018   PFIZER(Purple Top)SARS-COV-2 Vaccination 03/07/2019, 04/01/2019   Pneumococcal Conjugate-13 10/06/2013   Pneumococcal Polysaccharide-23 10/11/2010    Diabetes Mellitus: Tolerating Medications: yes Compliance with diet: fair, Body mass index is 38.07 kg/m. Exercise: minimal / intermittent Avg blood sugars at home: not checking Foot problems: none Hypoglycemia: none No nausea, vomitting, blurred vision, polyuria.  Lab Results  Component Value Date   HGBA1C 6.0 (A) 07/05/2020   HGBA1C 6.1 12/29/2019   HGBA1C 6.4 (A) 06/30/2019   Lab Results  Component  Value Date   MICROALBUR 1.5 12/29/2019   LDLCALC 89 04/16/2017   CREATININE 1.32 (H) 12/29/2019    Wt Readings from Last 3 Encounters:  07/05/20 201 lb 8 oz (91.4 kg)  06/08/20 204 lb (92.5 kg)  01/05/20 201 lb (91.2 kg)    Lipids: Doing well, stable. Tolerating meds fine with no SE. Panel reviewed with patient.  Lipids: Lab Results  Component Value Date   CHOL 232 (H) 12/29/2019   Lab Results  Component Value Date   HDL 41.90 12/29/2019   Lab Results  Component Value Date   LDLCALC 89 04/16/2017   Lab Results  Component Value Date   TRIG 228.0 (H) 12/29/2019   Lab Results  Component Value Date   CHOLHDL 6 12/29/2019    Lab Results  Component Value Date   ALT 14 12/29/2019   AST 15 12/29/2019   ALKPHOS 65 12/29/2019   BILITOT 0.6 12/29/2019    HTN: Tolerating all medications without side effects Stable and at goal No CP, no sob. No HA.  BP Readings from Last 3 Encounters:  07/05/20 (!) 160/70  06/08/20 138/70  04/12/20 883/25    Basic Metabolic Panel:    Component Value Date/Time   NA 140 12/29/2019 1009   NA CANCELED 08/12/2019 0933   NA 139 05/09/2013 2023   K 4.0 12/29/2019 1009   K 3.9 05/09/2013 2023   CL 104 12/29/2019 1009   CL 103 05/09/2013 2023   CO2 23 12/29/2019 1009   CO2 29 05/09/2013 2023   BUN 30 (H) 12/29/2019 1009   BUN 22 08/12/2019 0933   BUN 14 05/09/2013 2023   CREATININE 1.32 (H)  12/29/2019 1009   CREATININE 0.97 05/09/2013 2023   GLUCOSE 89 12/29/2019 1009   GLUCOSE 141 (H) 05/09/2013 2023   CALCIUM 9.7 12/29/2019 1009   CALCIUM 9.5 05/09/2013 2023     Review of Systems is noted in the HPI, as appropriate  Objective:   BP (!) 160/70   Pulse 81   Temp 98 F (36.7 C) (Temporal)   Ht 5' 1"  (1.549 m)   Wt 201 lb 8 oz (91.4 kg)   SpO2 99%   BMI 38.07 kg/m   GEN: No acute distress; alert,appropriate. PULM: Breathing comfortably in no respiratory distress PSYCH: Normally interactive.  CV: RRR, no m/g/r    Laboratory and Imaging Data: Results for orders placed or performed in visit on 07/05/20  POCT glycosylated hemoglobin (Hb A1C)  Result Value Ref Range   Hemoglobin A1C 6.0 (A) 4.0 - 5.6 %   HbA1c POC (<> result, manual entry)     HbA1c, POC (prediabetic range)     HbA1c, POC (controlled diabetic range)       Assessment and Plan:     ICD-10-CM   1. Type 2 diabetes mellitus with vascular disease (HCC)  E11.59 POCT glycosylated hemoglobin (Hb A1C)    2. Hyperlipidemia LDL goal <70  E78.5     3. Essential hypertension  I10     4. History of stroke  Z86.73      Diabetes is stable.  No changes.  Blood pressure is elevated somewhat today, but all of her other recent blood pressure checks have been normal.  She declines further COVID vaccines.  She does understand that she has significant increased risk for multiple risk factors.  Otherwise no changes.  Orders Placed This Encounter  Procedures   POCT glycosylated hemoglobin (Hb A1C)    Follow-up: Return in about 6 months (around 01/04/2021) for Medicare Wellness.  Signed,  Maud Deed. Jestine Bicknell, MD   Outpatient Encounter Medications as of 07/05/2020  Medication Sig   Accu-Chek FastClix Lancets MISC USE TO CHECK FBS AND 2 HR PP ONCE EVERY 2 WEEKS   Blood Glucose Calibration (ACCU-CHEK GUIDE CONTROL) LIQD 1 each by In Vitro route as directed.   Blood Glucose Monitoring Suppl (ACCU-CHEK GUIDE ME) w/Device KIT 1 each by Does not apply route every 14 (fourteen) days. Use to check FBS and 2 HR PP once every 2 weeks   ELIQUIS 5 MG TABS tablet TAKE 1 TABLET(5 MG) BY MOUTH TWICE DAILY   glipiZIDE (GLUCOTROL XL) 10 MG 24 hr tablet TAKE 2 TABLETS BY MOUTH EVERY DAY WITH BREAKFAST   glucose blood (ACCU-CHEK GUIDE) test strip USE AS DIRECTED TO CHECK FASTING BLOOD SUGAR AND 2 HOURS POST-PRANDIAL ONCE EVERY 2 WEEKS   hydrochlorothiazide (HYDRODIURIL) 25 MG tablet TAKE 1 TABLET(25 MG) BY MOUTH DAILY   losartan (COZAAR) 100 MG tablet TAKE 1  TABLET(100 MG) BY MOUTH DAILY   metFORMIN (GLUCOPHAGE-XR) 500 MG 24 hr tablet TAKE 2 TABLETS(1000 MG) BY MOUTH DAILY WITH BREAKFAST   metoprolol tartrate (LOPRESSOR) 100 MG tablet TAKE 1 TABLET(100 MG) BY MOUTH TWICE DAILY   mometasone (NASONEX) 50 MCG/ACT nasal spray Place 2 sprays into the nose daily as needed.   montelukast (SINGULAIR) 10 MG tablet TAKE 1 TABLET BY MOUTH AT BEDTIME (Patient taking differently: PRN)   pioglitazone (ACTOS) 30 MG tablet TAKE 1 TABLET(30 MG) BY MOUTH DAILY   sitaGLIPtin (JANUVIA) 100 MG tablet TAKE 1 TABLET(100 MG) BY MOUTH DAILY   triamcinolone (KENALOG) 0.1 % APPLY TOPICALLY TWICE DAILY  No facility-administered encounter medications on file as of 07/05/2020.

## 2020-07-05 ENCOUNTER — Ambulatory Visit (INDEPENDENT_AMBULATORY_CARE_PROVIDER_SITE_OTHER): Payer: Medicare Other | Admitting: Family Medicine

## 2020-07-05 ENCOUNTER — Other Ambulatory Visit: Payer: Self-pay

## 2020-07-05 ENCOUNTER — Encounter: Payer: Self-pay | Admitting: Family Medicine

## 2020-07-05 VITALS — BP 160/70 | HR 81 | Temp 98.0°F | Ht 61.0 in | Wt 201.5 lb

## 2020-07-05 DIAGNOSIS — Z8673 Personal history of transient ischemic attack (TIA), and cerebral infarction without residual deficits: Secondary | ICD-10-CM | POA: Diagnosis not present

## 2020-07-05 DIAGNOSIS — E1159 Type 2 diabetes mellitus with other circulatory complications: Secondary | ICD-10-CM

## 2020-07-05 DIAGNOSIS — E785 Hyperlipidemia, unspecified: Secondary | ICD-10-CM | POA: Diagnosis not present

## 2020-07-05 DIAGNOSIS — I10 Essential (primary) hypertension: Secondary | ICD-10-CM | POA: Diagnosis not present

## 2020-07-05 LAB — POCT GLYCOSYLATED HEMOGLOBIN (HGB A1C): Hemoglobin A1C: 6 % — AB (ref 4.0–5.6)

## 2020-07-08 ENCOUNTER — Other Ambulatory Visit: Payer: Self-pay | Admitting: Family Medicine

## 2020-07-08 DIAGNOSIS — E1159 Type 2 diabetes mellitus with other circulatory complications: Secondary | ICD-10-CM

## 2020-07-09 ENCOUNTER — Other Ambulatory Visit: Payer: Self-pay | Admitting: Family Medicine

## 2020-07-10 DIAGNOSIS — Z20822 Contact with and (suspected) exposure to covid-19: Secondary | ICD-10-CM | POA: Diagnosis not present

## 2020-07-12 ENCOUNTER — Other Ambulatory Visit: Payer: Self-pay | Admitting: Family Medicine

## 2020-07-13 ENCOUNTER — Telehealth: Payer: Self-pay

## 2020-07-13 NOTE — Chronic Care Management (AMB) (Addendum)
Chronic Care Management Pharmacy Assistant   Name: Nina Carpenter  MRN: [id number] DOB: [birth year]  Reason for Encounter: Disease State - Cholesterol   Recent office visits:  07/05/20 - Dr.[name] PCP no medication changes follow up 6 months  Recent consult visits:  06/08/20 - Gastroenterology no medication changes  Hospital visits:  None in previous 6 months  Medications: Outpatient Encounter Medications as of 07/13/2020  Medication Sig   Accu-Chek FastClix Lancets MISC USE TO CHECK FBS AND 2 HR PP ONCE EVERY 2 WEEKS   Blood Glucose Calibration (ACCU-CHEK GUIDE CONTROL) LIQD 1 each by In Vitro route as directed.   Blood Glucose Monitoring Suppl (ACCU-CHEK GUIDE ME) w/Device KIT 1 each by Does not apply route every 14 (fourteen) days. Use to check FBS and 2 HR PP once every 2 weeks   ELIQUIS 5 MG TABS tablet TAKE 1 TABLET(5 MG) BY MOUTH TWICE DAILY   glipiZIDE (GLUCOTROL XL) 10 MG 24 hr tablet TAKE 2 TABLETS BY MOUTH EVERY DAY WITH BREAKFAST   glucose blood (ACCU-CHEK GUIDE) test strip USE AS DIRECTED CHECK FASTING AND 2 HOURS AFTER MEALS ONCE EVERY 2 WEEKS AS DIRECTED   hydrochlorothiazide (HYDRODIURIL) 25 MG tablet TAKE 1 TABLET(25 MG) BY MOUTH DAILY   hydrochlorothiazide (HYDRODIURIL) 25 MG tablet TAKE 1 TABLET(25 MG) BY MOUTH DAILY   losartan (COZAAR) 100 MG tablet TAKE 1 TABLET(100 MG) BY MOUTH DAILY   metFORMIN (GLUCOPHAGE-XR) 500 MG 24 hr tablet TAKE 2 TABLETS(1000 MG) BY MOUTH DAILY WITH BREAKFAST   metoprolol tartrate (LOPRESSOR) 100 MG tablet TAKE 1 TABLET(100 MG) BY MOUTH TWICE DAILY   mometasone (NASONEX) 50 MCG/ACT nasal spray Place 2 sprays into the nose daily as needed.   montelukast (SINGULAIR) 10 MG tablet TAKE 1 TABLET BY MOUTH AT BEDTIME (Patient taking differently: PRN)   pioglitazone (ACTOS) 30 MG tablet TAKE 1 TABLET(30 MG) BY MOUTH DAILY   sitaGLIPtin (JANUVIA) 100 MG tablet TAKE 1 TABLET(100 MG) BY MOUTH DAILY   triamcinolone (KENALOG) 0.1 % APPLY  TOPICALLY TWICE DAILY   No facility-administered encounter medications on file as of 07/13/2020.   07/13/2020 Name: Nina Carpenter MRN: [id number] DOB: [birth year] Nina Carpenter is a 75 y.o. year old female who is a primary care patient of [name], Spencer, MD.  Comprehensive medication review performed; Spoke to patient regarding cholesterol  Lipid Panel    Component Value Date/Time   CHOL 232 (H) 12/29/2019 1009   TRIG 228.0 (H) 12/29/2019 1009   HDL 41.90 12/29/2019 1009   LDLCALC 89 04/16/2017 1123   LDLDIRECT 139.0 12/29/2019 1009    10-year ASCVD risk score: The ASCVD Risk score Nina Bussing DC Jr., Nina al., Nina Carpenter) failed to calculate for the following reasons:   The patient has a prior MI or stroke diagnosis  Current antihyperlipidemic regimen: No current pharmacotherapy  Previous antihyperlipidemic medications tried: Rosuvastatin-shoulder pain, Simvastatin- knee pain   ASCVD risk enhancing conditions: age >59, DM, and HTN  What recent interventions/DTPs have been made by any provider to improve Cholesterol control since last CPP Visit: Patient reports she is off all statins currently and saw Dr.[name] 07/05/20 and he agreed.  Any recent hospitalizations or ED visits since last visit with CPP? No  What diet changes have been made to improve Cholesterol?   The patient reports she makes healthy meal choices and stays active  What exercise is being done to improve Cholesterol?  The patient continues to work and stay active with exercise  Adherence Review: Does  the patient have >5 day gap between last estimated fill dates? No  Losartan 147m  04/12/20 90ds Metformin XR 5044m 06/29/20  90ds Januvia 10081m5/16/22 90ds Actos 35m44m6/7/22  90ds Glipizide 10mg71m17/22 90ds   Endocrinology appointment on 08/12/20 and Cardiology appointment on 07/27/20    MicheDebbora Dus notified  VelmeAvel [employer] [place] [phone] reviewed the care  management and care coordination activities outlined in this encounter and I am certifying that I agree with the content of this note. No further action required.  MicheDebbora DusrmD Clinical Pharmacist [phone]

## 2020-07-14 ENCOUNTER — Telehealth: Payer: Self-pay

## 2020-07-14 NOTE — Telephone Encounter (Addendum)
Pt left v/m that pts husband tested + for covid on 07/10/20 and pt wants to know is there a preventative med or what to do to prevent pt getting covid. I was unable to speak with pt and left v/m requesting cb for additional info. I spoke with pt; I advised pt to my knowledge there was no med to prevent covid; pt started with diarrhea on 07/12/20 where pt has diarrhea if eats anything; diarrhea is not watery.pt is drinking plenty of fluids; on 07/13/20 pt started with prod cough with clear phlegm, runny nose and sinus congestion. Pt has been taking mucinex and tylenol. Last tylenol was today at 9 AM. Pt does not think she has fever, CP or SOB. Pt will go to [facility] in [place] this morning for eval and covid testing. In the meantime pt will self quarantine, drink plenty of fluids, rest and take tylenol for fever. Sending note to Dr. Lorelei Pont.

## 2020-07-14 NOTE — Telephone Encounter (Signed)
Called and spoke to patient by telephone and scheduled her for a virtual visit tomorrow 07/15/20 at 9:40 am with Dr. Lorelei Pont.

## 2020-07-14 NOTE — Telephone Encounter (Signed)
Patient called the office back and stated that she went to the [facility] and decided not to stay because the clerk told her if they did a covid test it would take them 3 days to get the results back. Patient stated when she got back home she did a home test and it was negative. Patient stated her only concern about the home test is that when her husband did the first one it was negative and then the second one was positive. Patient was given ER precautions and she verbalized understanding.

## 2020-07-14 NOTE — Telephone Encounter (Signed)
I have openings tomorrow.  Can you set her up a virtual with me.

## 2020-07-15 ENCOUNTER — Other Ambulatory Visit: Payer: Self-pay

## 2020-07-15 ENCOUNTER — Encounter: Payer: Self-pay | Admitting: Family Medicine

## 2020-07-15 ENCOUNTER — Telehealth (INDEPENDENT_AMBULATORY_CARE_PROVIDER_SITE_OTHER): Payer: Medicare Other | Admitting: Family Medicine

## 2020-07-15 VITALS — BP 140/75 | HR 98 | Temp 101.0°F | Ht 61.0 in

## 2020-07-15 DIAGNOSIS — U071 COVID-19: Secondary | ICD-10-CM | POA: Diagnosis not present

## 2020-07-15 MED ORDER — MOLNUPIRAVIR EUA 200MG CAPSULE: 4.0000 | ORAL_CAPSULE | Freq: Two times a day (BID) | ORAL | 0 refills | Status: AC

## 2020-07-15 NOTE — Progress Notes (Signed)
Keyante Durio T. Barnabas Henriques, MD Primary Care and [place] at [facility] [place], [address] Phone: [phone]  FAX: [phone]  JEANITA CARNEIRO - 75 y.o. female  MRN [id number]  Date of Birth: 12-18-1945  Visit Date: 07/15/2020  PCP: [name], MD  Referred by: [name], MD  Virtual Visit via Video Note:  I connected with  Nobie Putnam on 07/15/2020  9:40 AM EDT by a video enabled telemedicine application and verified that I am speaking with the correct person using two identifiers.   Location patient: home computer, tablet, or smartphone Location provider: work or home office Consent: Verbal consent directly obtained from [employer]. Persons participating in the virtual visit: patient, provider  I discussed the limitations of evaluation and management by telemedicine and the availability of in person appointments. The patient expressed understanding and agreed to proceed.  Chief Complaint  Patient presents with   Covid Positive    2-Positive Home Test   Fever   Cough    With Chest Congestion    History of Present Illness:  Known patient, she presents with a video visit for COVID-19 discussion.  Her husband just tested positive for COVID on July 10, 2020.  She does have some diarrhea as well as a productive cough, runny nose, and sinus congestion.  She has been taking some over-the-counter remedies such as Mucinex and Tylenol. 101.1 Tmax yesterday  + fever, but no chest pain, shortness of breath.  She is currently self isolation, and she is attempting to rest and drink plenty of fluids.  Covid test x 2 has been positive now.   R side pain some on her chest.  Some diarrhea.   No loss of taste or smell.  Immunization History  Administered Date(s) Administered   Fluad Quad(high Dose 65+) 01/05/2020   Influenza Split 11/13/2011   Influenza, High Dose Seasonal PF 11/23/2016    Influenza,inj,Quad PF,6+ Mos 11/18/2012, 10/06/2013, 12/23/2014, 03/20/2016, 01/28/2018   PFIZER(Purple Top)SARS-COV-2 Vaccination 03/07/2019, 04/01/2019   Pneumococcal Conjugate-13 10/06/2013   Pneumococcal Polysaccharide-23 10/11/2010     Review of Systems as above: See pertinent positives and pertinent negatives per HPI No acute distress verbally   Observations/Objective/Exam:  An attempt was made to discern vital signs over the phone and per patient if applicable and possible.   General:    Alert, Oriented, appears well and in no acute distress  Pulmonary:     On inspection no signs of respiratory distress.  Psych / Neurological:     Pleasant and cooperative.  Assessment and Plan:    ICD-10-CM   1. COVID-19  U07.1      Multiple risk factors causing high risk.  Treat with antivirals, other supportive care.  Red flags reviewed.  I discussed the assessment and treatment plan with the patient. The patient was provided an opportunity to ask questions and all were answered. The patient agreed with the plan and demonstrated an understanding of the instructions.   The patient was advised to call back or seek an in-person evaluation if the symptoms worsen or if the condition fails to improve as anticipated.  Follow-up: prn unless noted otherwise below No follow-ups on file.  Meds ordered this encounter  Medications   molnupiravir EUA 200 mg CAPS    Sig: Take 4 capsules (800 mg total) by mouth 2 (two) times daily for 5 days.    Dispense:  40 capsule    Refill:  0    No orders of the defined types were placed in this encounter.   Signed,  Maud Deed. Chaniece Barbato, MD

## 2020-07-16 DIAGNOSIS — R4020 Unspecified coma: Secondary | ICD-10-CM | POA: Diagnosis not present

## 2020-07-16 DIAGNOSIS — E162 Hypoglycemia, unspecified: Secondary | ICD-10-CM | POA: Diagnosis not present

## 2020-07-16 DIAGNOSIS — R404 Transient alteration of awareness: Secondary | ICD-10-CM | POA: Diagnosis not present

## 2020-07-16 DIAGNOSIS — E161 Other hypoglycemia: Secondary | ICD-10-CM | POA: Diagnosis not present

## 2020-07-19 ENCOUNTER — Telehealth: Payer: Self-pay

## 2020-07-19 NOTE — Telephone Encounter (Signed)
Contacted pt's husband, Rip Harbour, who reports tp is doing fine now and will be finished with the antiviral today. He reports pt was not eating during the time of the low BG because she was not feeling well and he did not know she should be so he did not push her to eat. He called EMS immediately though and pt is doing fine now. Advised if any changes to contact office. Advised of ER precautions. Ervin verbalized understanding.

## 2020-07-19 NOTE — Telephone Encounter (Signed)
[place] Night - Client TELEPHONE ADVICE RECORD Access[facility] Patient Name: Nina Carpenter Gender: Female DOB: [birth year] Age: 75 Y 11 M 20 D Return Phone Number: [phone] (Primary), [phone] (Secondary) Address[place]  [address] Client [place] Night - Client Client Site [place] Physician [name] - MD Contact Type Call Who Is Calling Patient / Member / Family / Caregiver Call Type Triage / Clinical Caller Name Kathe Wirick Relationship To Patient Spouse Return Phone Number [phone] (Primary) Chief Complaint Blood Sugar Low Reason for Call Symptomatic / Request for [place] states wife is covid+ and was rx'd Molnupiravivir for covid 2 days ago. Last night he had to call EMS because BG=37 and confusion. She received IV and got BG back up to 137. Overnight BG=230. Current BG=127. She is supposed to take med in am. Unsure if she should take it or not. She has been having diarrhea and decreased appetite. She is not doing terrible with the covid but the sugar situation has shocked them. Translation No Nurse Assessment Nurse: Kathi Ludwig, RN, Leana Roe Date/Time (Eastern Time): 07/17/2020 11:06:55 AM Confirm and document reason for call. If symptomatic, describe symptoms. ---Caller states Molnupiravivir for covid 2 days ago. Last night he had to call EMS because BG=37 and confusion. She received IV and got BG back up to 137. Overnight BG=230. having diarrhea and decreased appetite. 0900 BG 127, takes Metformin, Januvia. Does the patient have any new or worsening symptoms? ---Yes Will a triage be completed? ---Yes Related visit to physician within the last 2 weeks? ---No Does the PT have any chronic conditions? (i.e. diabetes, asthma, this includes High risk factors for pregnancy, etc.) ---Yes List chronic  conditions. ---DM Is this a behavioral health or substance abuse call? ---No PLEASE NOTE: All timestamps contained within this report are represented as [place] Standard Time. CONFIDENTIALTY NOTICE: This fax transmission is intended only for the addressee. It contains information that is legally privileged, confidential or otherwise protected from use or disclosure. If you are not the intended recipient, you are strictly prohibited from reviewing, disclosing, copying using or disseminating any of this information or taking any action in reliance on or regarding this information. If you have received this fax in error, please notify us immediately by telephone so that we can arrange for its return to [name]. Phone: [phone], Toll-Free: [phone], Fax: [phone] Page: 2 of 3 Call Id: [id number] Guidelines Guideline Title Affirmed Question Affirmed Notes Nurse Date/Time Eilene Ghazi Time) Diarrhea MILD-MODERATE diarrhea (e.g., 1-6 times / day more than normal) Kathi Ludwig, RN, Tracie 07/17/2020 11:10:31 AM Diabetes - Low Blood Sugar Sick day rules for people with diabetes who do not use insulin, questions about Milus Banister 07/17/2020 11:13:38 AM Disp. Time Eilene Ghazi Time) Disposition Final User 07/17/2020 11:13:20 [place], RN, Leana Roe 07/17/2020 11:17:41 AM Home Care Yes Kathi Ludwig, RN, Leana Roe Caller Disagree/Comply Comply Caller Understands Yes PreDisposition Did not know what to do Care Advice Given Per Guideline HOME CARE: * You should be able to treat this at home. REASSURANCE AND EDUCATION - DIARRHEA: * Diarrhea may caused by a virus ('stomach flu') or a bacteria. Diarrhea is one of the body's way of getting rid of germs. * Certain foods (e.g., dairy products, supplements like Ensure) can also trigger diarrhea. * In some people, the exact cause is never found. * Staying well-hydrated is the most important thing if you have diarrhea. From  what you have told me, it  sounds like you are not severely dehydrated at this point. FLUID THERAPY DURING MILD TO MODERATE DIARRHEA: * Drink more fluids, at least 8 to 10 cups daily. One cup equals 8 oz (240 ml). * WATER: For mild to moderate diarrhea, water is often the best liquid to drink. You should also eat some salty foods (e.g., potato chips, pretzels, saltine crackers). This is important to make sure you are getting enough salt, sugars, and fluids to meet your body's needs. * Avoid carbonated soft drinks (soda) as these can make your diarrhea worse. * Avoid alcohol beverages (e.g., beer, wine, hard liquor). * Avoid caffeinated beverages. Reason: Caffeine is mildly dehydrating. * AVOID greasy, fatty or spicy foods. * Eat smaller meals and snacks more often during the day rather than 3 larger meals. * You can also eat bananas, yogurt, crackers, soup. FOOD AND NUTRITION DURING MILD TO MODERATE DIARRHEA: * Maintaining some food intake during episodes of diarrhea is important. * Begin with boiled starches / cereals (e.g., potatoes, rice, noodles, wheat, oats) with a small amount of salt to taste. CALL BACK IF: * Signs of dehydration occur (e.g., no urine over 12 hours, very dry mouth, lightheaded, etc.) * Diarrhea lasts over 7 days * You become worse CARE ADVICE given per Diarrhea (Adult) guideline. EXPECTED COURSE: * Viral diarrhea lasts 4 to 7 days. * Appetite poor, severe nausea: Take a liquid diet. Sip 1 tablespoon (15 ml) of liquid every 10 minutes. Examples include broth, juice, sport drinks, and soft drinks. * Appetite fair, moderate nausea: Eat a bland diet. Try small amounts of food 6 to 8 times a day. Take 1/2 to 1 cup (120 - 240 ml) of food or liquids every 1 to 2 hours. SICK DAY RULES - DIET: HOME CARE: * You should be able to treat this at home. SICK DAY RULES - FOR PEOPLE WITH DIABETES WHO DO NOT USE INSULIN: * Do not stop taking your diabetes pills. Reason: During illness the blood sugar often rises. *  Appetite OK, minimal nausea: Continue your usual diabetic meal plan. Avoid spicy or greasy foods. CARE ADVICE given per Diabetes - Low Blood Sugar (Adult) guideline. * PLEASE NOTE: All timestamps contained within this report are represented as [place] Standard Time. CONFIDENTIALTY NOTICE: This fax transmission is intended only for the addressee. It contains information that is legally privileged, confidential or otherwise protected from use or disclosure. If you are not the intended recipient, you are strictly prohibited from reviewing, disclosing, copying using or disseminating any of this information or taking any action in reliance on or regarding this information. If you have received this fax in error, please notify us immediately by telephone so that we can arrange for its return to [name]. Phone: [phone], Toll-Free: [phone], Fax: [phone] Page: 3 of 3 Call Id: [id number] Care Advice Given Per Guideline You become worse * Vomiting lasting over 4 hours * Blood glucose under 70 mg/dL(3.9 mmol/L) and persists over 30 minutes * Blood glucose over 300 mg/dL (16.7 mmol/L), two or more times in a row. CALL BACK IF: * You need even more fluids if you have fever, vomiting or diarrhea. * Drink more fluids, at least 8 to 10 glasses daily (8 oz or 240 ml each glass). SICK DAY RULES - LIQUIDS: * If glucose under 120 mg/dL (6.5 mmol/L): Drink sugar-containing liquids (e.g., sports drinks, juice, soft drinks).

## 2020-07-21 MED ORDER — PREDNISONE 10 MG PO TABS: ORAL_TABLET | ORAL | 0 refills | Status: DC

## 2020-07-21 NOTE — Telephone Encounter (Signed)
Pt called triage line. Said she is feeling better after taking the Paxlovid but still has a wet, productive cough. She was not given anything for cough. Was told to get something OTC, which she did not. She is asking what she can take to get rid of what is down in her chest. She stopped taking plain Mucinex when she started Paxlovid. Says she usually needs prednisone to clear her chest. She uses [facility] [address]. She is aware Dr Lorelei Pont is out of the office.

## 2020-07-21 NOTE — Addendum Note (Signed)
Addended by: Loura Pardon A on: 07/21/2020 03:28 PM   Modules accepted: Orders

## 2020-07-21 NOTE — Telephone Encounter (Signed)
I reviewed her note/chart Sent prednisone 30 mg taper Please warn of side eff including elevated glucose  Call if no improvement  Is sob-go to ER

## 2020-07-22 NOTE — Telephone Encounter (Signed)
[name] notified as instructed by telephone.  Patient states understanding.

## 2020-07-27 ENCOUNTER — Ambulatory Visit (INDEPENDENT_AMBULATORY_CARE_PROVIDER_SITE_OTHER): Payer: Medicare Other

## 2020-07-27 DIAGNOSIS — I495 Sick sinus syndrome: Secondary | ICD-10-CM

## 2020-07-27 LAB — CUP PACEART REMOTE DEVICE CHECK
Battery Remaining Longevity: 35 mo
Battery Voltage: 2.95 V
Brady Statistic AP VP Percent: 0.1 %
Brady Statistic AP VS Percent: 47.61 %
Brady Statistic AS VP Percent: 0.03 %
Brady Statistic AS VS Percent: 52.26 %
Brady Statistic RA Percent Paced: 47.49 %
Brady Statistic RV Percent Paced: 0.13 %
Date Time Interrogation Session: 20220705074430
Implantable Lead Implant Date: 20141208
Implantable Lead Implant Date: 20141208
Implantable Lead Location: 753859
Implantable Lead Location: 753860
Implantable Lead Model: 5076
Implantable Lead Model: 5076
Implantable Pulse Generator Implant Date: 20141208
Lead Channel Impedance Value: 323 Ohm
Lead Channel Impedance Value: 361 Ohm
Lead Channel Impedance Value: 361 Ohm
Lead Channel Impedance Value: 399 Ohm
Lead Channel Pacing Threshold Amplitude: 0.5 V
Lead Channel Pacing Threshold Amplitude: 0.75 V
Lead Channel Pacing Threshold Pulse Width: 0.4 ms
Lead Channel Pacing Threshold Pulse Width: 0.4 ms
Lead Channel Sensing Intrinsic Amplitude: 1.625 mV
Lead Channel Sensing Intrinsic Amplitude: 1.625 mV
Lead Channel Sensing Intrinsic Amplitude: 6.5 mV
Lead Channel Sensing Intrinsic Amplitude: 6.5 mV
Lead Channel Setting Pacing Amplitude: 2 V
Lead Channel Setting Pacing Amplitude: 2.5 V
Lead Channel Setting Pacing Pulse Width: 0.4 ms
Lead Channel Setting Sensing Sensitivity: 2 mV

## 2020-08-04 ENCOUNTER — Other Ambulatory Visit: Payer: Self-pay | Admitting: Family Medicine

## 2020-08-04 DIAGNOSIS — E1159 Type 2 diabetes mellitus with other circulatory complications: Secondary | ICD-10-CM

## 2020-08-12 ENCOUNTER — Encounter: Payer: Medicare Other | Admitting: Gastroenterology

## 2020-08-16 NOTE — Progress Notes (Signed)
Remote pacemaker transmission.   

## 2020-09-10 IMAGING — MR MR KNEE*R* W/O CM
6 series · 40 of 40 positions shown · non-contrast
Comparison: None.

CLINICAL DATA: Chronic right knee pain.

EXAM:
MRI OF THE RIGHT KNEE WITHOUT CONTRAST
TECHNIQUE: Multiplanar, multisequence MR imaging of the knee was performed. No
intravenous contrast was administered.

[Series 12: T2 fat-sat · axial · right · 4.0mm · 0.40mm/px · z∈[-141,-6]mm · 7 of 28 slices shown (1 of 3)]
[im 1/28]
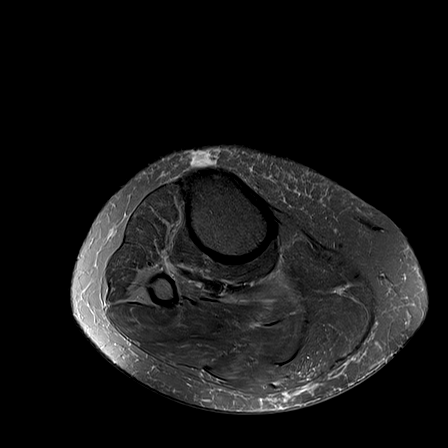
[im 5/28]
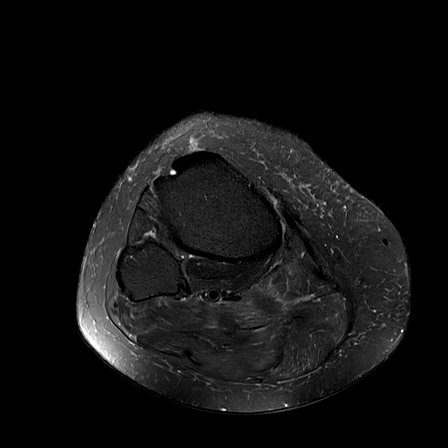
[im 10/28]
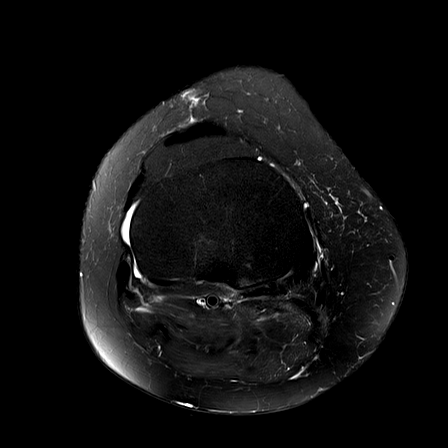
[im 14/28]
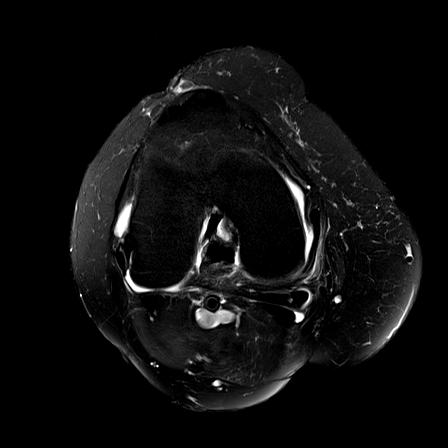
[im 19/28]
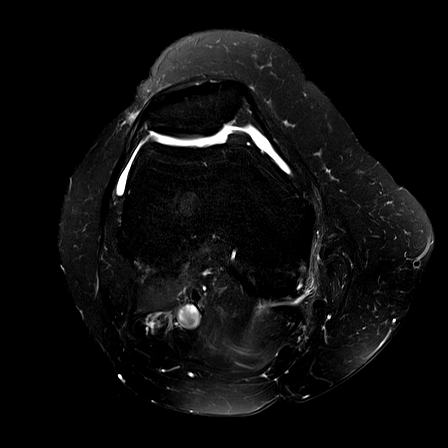
[im 23/28]
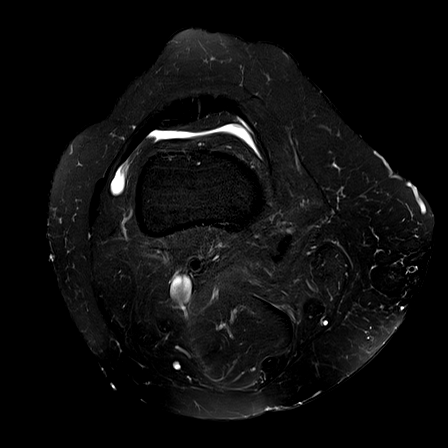
[im 28/28]
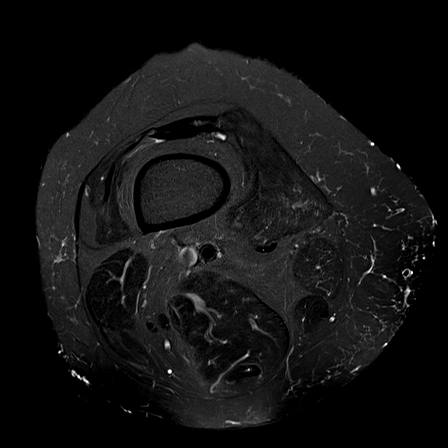

[Series 13: T1 · coronal · right · 4.0mm · 0.49mm/px · 7 of 25 slices shown]
[im 1/25]
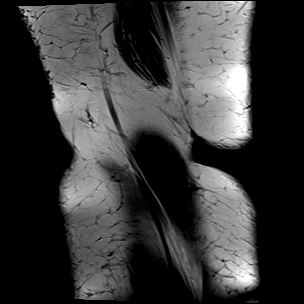
[im 5/25]
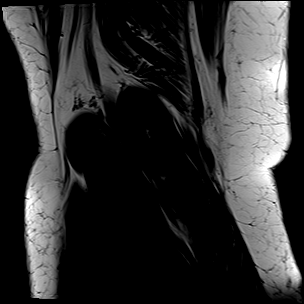
[im 9/25]
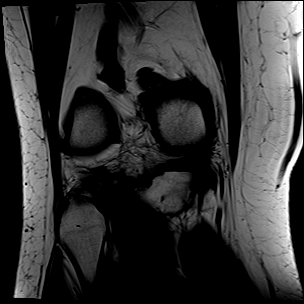
[im 13/25]
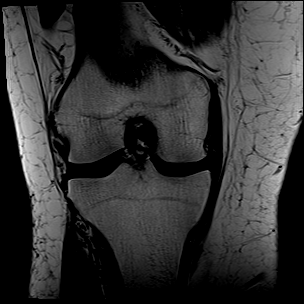
[im 17/25]
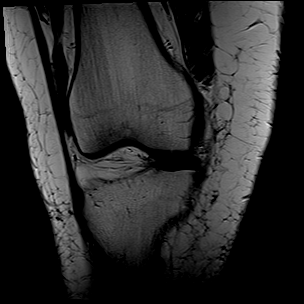
[im 21/25]
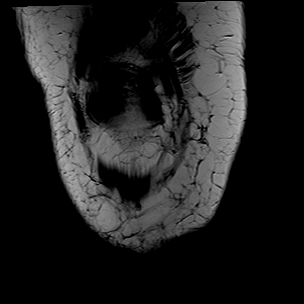
[im 25/25]
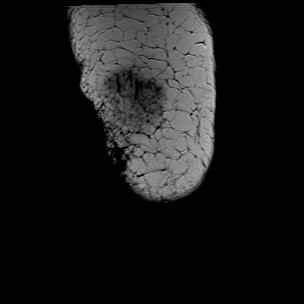

[Series 14: PD fat-sat · coronal · right · 4.0mm · 0.44mm/px · 6 of 24 slices shown (1 of 2)]
[im 1/24]
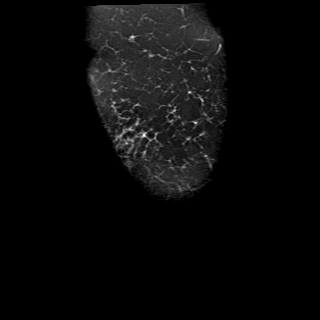
[im 5/24]
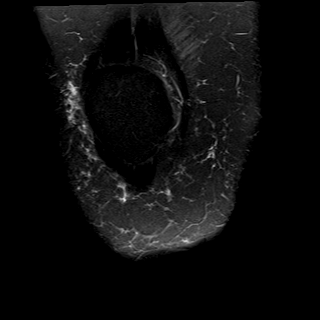
[im 10/24]
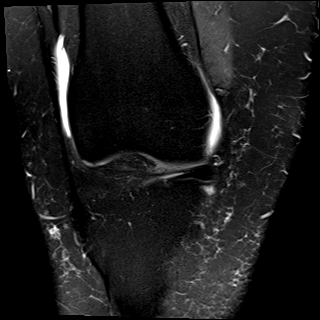
[im 14/24]
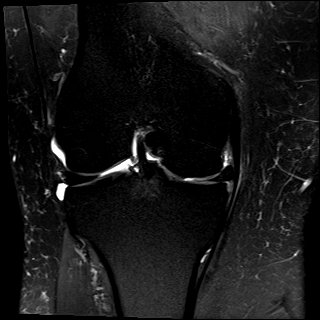
[im 19/24]
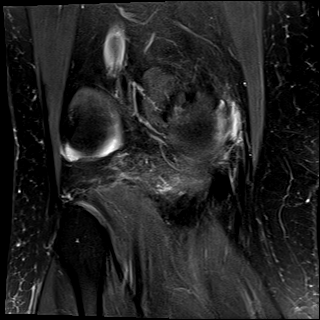
[im 24/24]
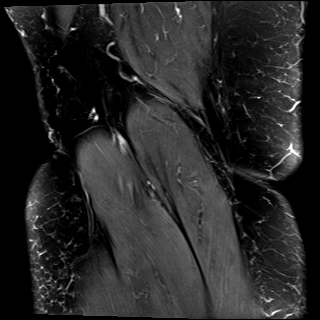

[Series 15: T2 fat-sat · coronal · right · 4.0mm · 0.59mm/px · 6 of 24 slices shown (2 of 3)]
[im 1/24]
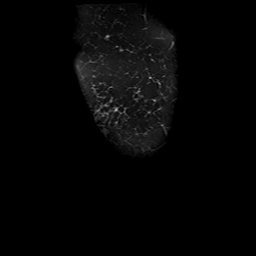
[im 5/24]
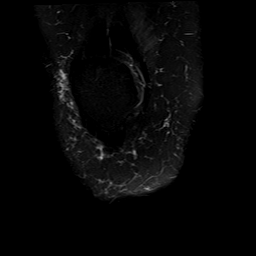
[im 10/24]
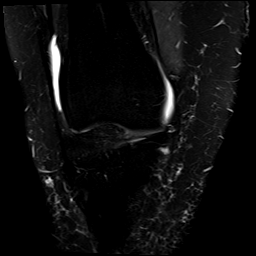
[im 14/24]
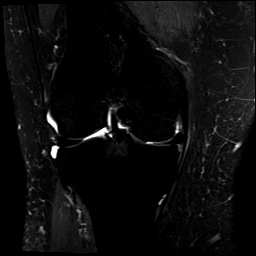
[im 19/24]
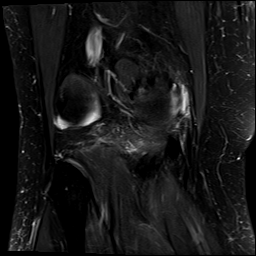
[im 24/24]
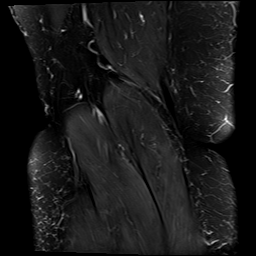

[Series 16: PD fat-sat · sagittal · right · 3.0mm · 0.59mm/px · 7 of 26 slices shown (2 of 2)]
[im 1/26]
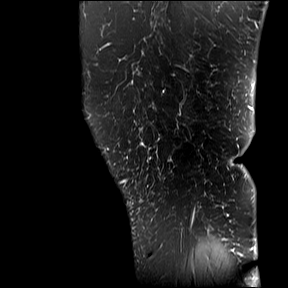
[im 5/26]
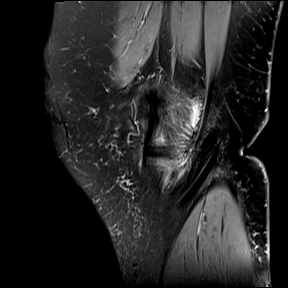
[im 9/26]
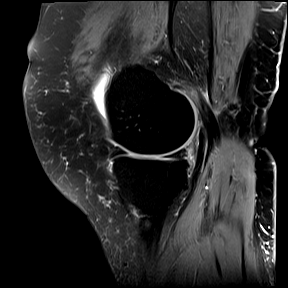
[im 13/26]
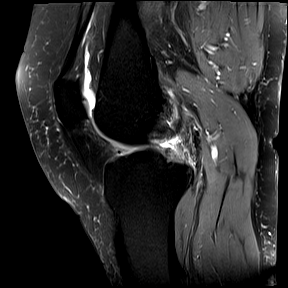
[im 17/26]
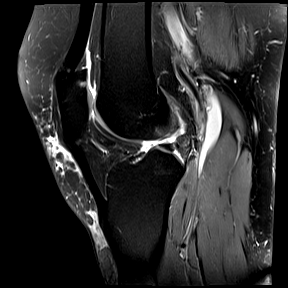
[im 21/26]
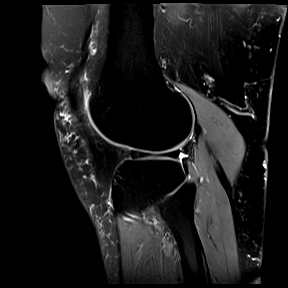
[im 26/26]
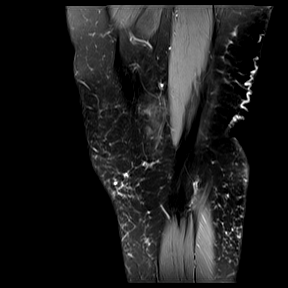

[Series 17: T2 fat-sat · sagittal · right · 3.0mm · 0.59mm/px · 7 of 26 slices shown (3 of 3)]
[im 1/26]
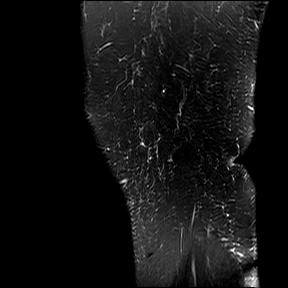
[im 5/26]
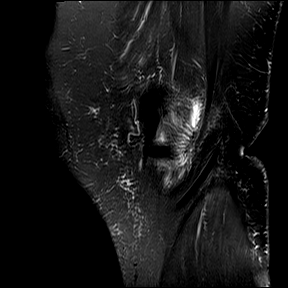
[im 9/26]
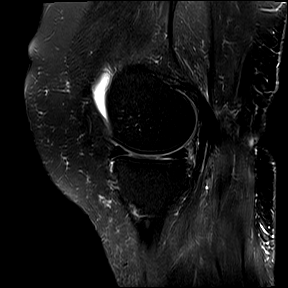
[im 13/26]
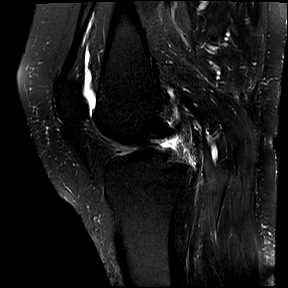
[im 17/26]
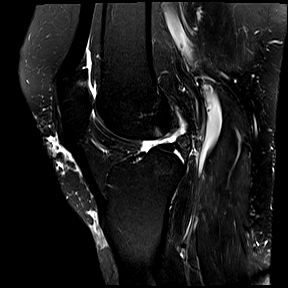
[im 21/26]
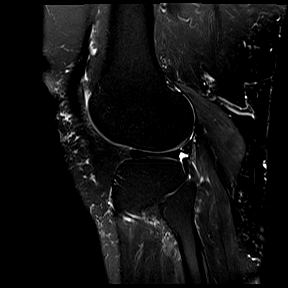
[im 26/26]
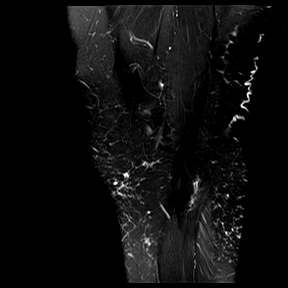

[40 of 40 positions shown; findings below may reference images not displayed]

FINDINGS: MENISCI

Medial meniscus: There is a complex tear of the root of the
posterior horn of the medial meniscus. There is no definitive
complete radial tear but the meniscus is peripherally subluxed.

Lateral meniscus:  Normal.

LIGAMENTS

Cruciates:  Normal.

Collaterals:  Normal.

CARTILAGE

Patellofemoral: Small focal areas of full-thickness cartilage loss
at the superior aspect of the apex and lateral facet of the patella.

Medial: Small focal area of full-thickness cartilage loss of the
central portion of the femoral condyle.

Lateral:  Normal.

Joint:  Small joint effusion.

Popliteal Fossa:  Intact popliteus tendon.  Very tiny Baker's cyst.

Extensor Mechanism:  Normal.

Bones:  Normal.

Other: None
IMPRESSION: 1. Complex tear of the root of the posterior horn of the medial
meniscus.
2. Small joint effusion.
3. Small focal areas of full-thickness cartilage loss at the
superior aspect of the apex and lateral facet of the patella. Small
focal area of full-thickness cartilage loss of the central portion
of the medial femoral condyle.

## 2020-09-13 ENCOUNTER — Other Ambulatory Visit: Payer: Self-pay | Admitting: Family Medicine

## 2020-09-30 ENCOUNTER — Telehealth: Payer: Self-pay | Admitting: Family Medicine

## 2020-09-30 MED ORDER — METFORMIN HCL ER 500 MG PO TB24: ORAL_TABLET | ORAL | 5 refills | Status: DC

## 2020-10-01 ENCOUNTER — Telehealth: Payer: Self-pay

## 2020-10-01 DIAGNOSIS — E1159 Type 2 diabetes mellitus with other circulatory complications: Secondary | ICD-10-CM

## 2020-10-01 MED ORDER — ACCU-CHEK GUIDE VI STRP: ORAL_STRIP | 3 refills | Status: DC

## 2020-10-01 NOTE — Telephone Encounter (Signed)
Refills for test strips sent to [facility] on [address].

## 2020-10-01 NOTE — Telephone Encounter (Signed)
[place] Night - Client TELEPHONE ADVICE RECORD [facility] Patient Name: Nina Carpenter Gender: Female DOB: 06-28-1945 Age: 75 Y 2 M 4 D Return Phone Number: [phone] (Primary), [phone] (Secondary) Address: City/ State/ Zip: [place] [address] Client [facility] [place] Night - Client Client Site [place] Physician [name], Frederico Hamman - MD Contact Type Call Who Is Calling Patient / Member / Family / Caregiver Call Type Triage / Clinical Relationship To Patient Self Return Phone Number [phone] (Primary) Chief Complaint Prescription Refill or Medication Request (non symptomatic) Reason for Call Medication Question / Request Initial Comment Caller states she called her pharmacy to get her test trips, they wanted to give her a whole kit, she just needs the strips and they told her to call her dr. Translation No No Triage Reason Patient declined Nurse Assessment Nurse: Tawni Pummel, RN, Marya Amsler Date/Time ([place] Time): 09/30/2020 9:25:55 PM Confirm and document reason for call. If symptomatic, describe symptoms. ---Caller states she called her pharmacy to get her test trips, they wanted to give her a whole kit, she just needs the strips and they told her to call her dr. Osvaldo Human states the kit comes with test strips, the accucheck meter but she only needs the strips. Does the patient have any new or worsening symptoms? ---Yes Will a triage be completed? ---Yes Related visit to physician within the last 2 weeks? ---No Does the PT have any chronic conditions? (i.e. diabetes, asthma, this includes High risk factors for pregnancy, etc.) ---Yes List chronic conditions. ---diabetes, htn Is this a behavioral health or substance abuse call? ---No Guidelines Guideline Title Affirmed Question Affirmed Notes Nurse Date/Time (Eastern Time) Diabetes - High Blood Sugar [1] Caller has  NONURGENT medication or insulin pump question AND [2] triager unable to answer question Kevan Ny 09/30/2020 9:31:39 PM PLEASE NOTE: All timestamps contained within this report are represented as [place] Standard Time. CONFIDENTIALTY NOTICE: This fax transmission is intended only for the addressee. It contains information that is legally privileged, confidential or otherwise protected from use or disclosure. If you are not the intended recipient, you are strictly prohibited from reviewing, disclosing, copying using or disseminating any of this information or taking any action in reliance on or regarding this information. If you have received this fax in error, please notify us immediately by telephone so that we can arrange for its return to [name]. Phone: [phone], Toll-Free: [phone], Fax: [phone] Page: 2 of 2 Call Id: [id number] [place]. Time Eilene Ghazi Time) Disposition Final User 09/30/2020 9:34:11 PM Call PCP within 24 Hours Yes Tawni Pummel, RN, Joneen Roach Disagree/Comply Comply Caller Understands Yes PreDisposition Did not know what to do Care Advice Given Per Guideline CALL PCP WITHIN 24 HOURS: CARE ADVICE given per Diabetes - High Blood Sugar (Adult) guideline. CALL BACK IF: * You have more questions. * You become worse Referrals REFERRED TO PCP OFFICE

## 2020-10-02 ENCOUNTER — Telehealth: Payer: Self-pay | Admitting: Family Medicine

## 2020-10-02 DIAGNOSIS — E1159 Type 2 diabetes mellitus with other circulatory complications: Secondary | ICD-10-CM

## 2020-10-04 MED ORDER — ACCU-CHEK FASTCLIX LANCETS MISC: 0 refills | Status: DC

## 2020-10-07 MED ORDER — METOPROLOL TARTRATE 100 MG PO TABS: ORAL_TABLET | ORAL | 1 refills | Status: DC

## 2020-10-07 NOTE — Telephone Encounter (Signed)
I received refill request for Metformin, not Metoprolol.  Refills sent today for Metoprolol.

## 2020-10-07 NOTE — Addendum Note (Signed)
Addended by: Carter Kitten on: 10/07/2020 09:30 AM   Modules accepted: Orders

## 2020-10-07 NOTE — Telephone Encounter (Signed)
Pharmacy faxing for update on metoprolol refill request.

## 2020-10-13 ENCOUNTER — Telehealth: Payer: Self-pay

## 2020-10-13 NOTE — Progress Notes (Addendum)
Chronic Care Management Pharmacy Assistant   Name: Nina Carpenter  MRN: [id number] DOB: 10-26-1945  Reason for Encounter: Diabetes Disease State   Recent office visits:  07/19/2020 - Patient called in to office for cough due to Covid. Start: Prednisone 53m taper.  07/15/2020 - Nina Loffler MD - Video Visit - Patient presented for Covid-19. Two positive Covid tests. Started: Molnupiravir EUA 209mCaps.  Recent consult visits:  None since last CCM contact  Hospital visits:  None in previous 6 months  Medications: Outpatient Encounter Medications as of 10/13/2020  Medication Sig   Accu-Chek FastClix Lancets MISC USE TO CHECK BLOOD SUGAR ONCE EVERY 2 WEEKS   Blood Glucose Calibration (ACCU-CHEK GUIDE CONTROL) LIQD 1 each by In Vitro route as directed.   Blood Glucose Monitoring Suppl (ACCU-CHEK GUIDE ME) w/Device KIT 1 each by Does not apply route every 14 (fourteen) days. Use to check FBS and 2 HR PP once every 2 weeks   ELIQUIS 5 MG TABS tablet TAKE 1 TABLET(5 MG) BY MOUTH TWICE DAILY   glipiZIDE (GLUCOTROL XL) 10 MG 24 hr tablet TAKE 2 TABLETS BY MOUTH EVERY DAY WITH BREAKFAST   glucose blood (ACCU-CHEK GUIDE) test strip Use as instructed   hydrochlorothiazide (HYDRODIURIL) 25 MG tablet TAKE 1 TABLET(25 MG) BY MOUTH DAILY   hydrochlorothiazide (HYDRODIURIL) 25 MG tablet TAKE 1 TABLET(25 MG) BY MOUTH DAILY   losartan (COZAAR) 100 MG tablet TAKE 1 TABLET(100 MG) BY MOUTH DAILY   metFORMIN (GLUCOPHAGE-XR) 500 MG 24 hr tablet TAKE 2 TABLETS(1000 MG) BY MOUTH DAILY WITH BREAKFAST   metoprolol tartrate (LOPRESSOR) 100 MG tablet TAKE 1 TABLET(100 MG) BY MOUTH TWICE DAILY   mometasone (NASONEX) 50 MCG/ACT nasal spray Place 2 sprays into the nose daily as needed.   montelukast (SINGULAIR) 10 MG tablet Take 10 mg by mouth at bedtime as needed.   pioglitazone (ACTOS) 30 MG tablet TAKE 1 TABLET(30 MG) BY MOUTH DAILY   predniSONE (DELTASONE) 10 MG tablet Take 3 pills once daily by  mouth for 3 days, then 2 pills once daily for 3 days, then 1 pill once daily for 3 days and then stop   sitaGLIPtin (JANUVIA) 100 MG tablet TAKE 1 TABLET(100 MG) BY MOUTH DAILY   triamcinolone (KENALOG) 0.1 % APPLY TOPICALLY TWICE DAILY   No facility-administered encounter medications on file as of 10/13/2020.    Recent Relevant Labs: Lab Results  Component Value Date/Time   HGBA1C 6.0 (A) 07/05/2020 09:39 AM   HGBA1C 6.1 12/29/2019 10:09 AM   HGBA1C 6.4 (A) 06/30/2019 09:34 AM   HGBA1C 8.0 (H) 07/29/2018 09:47 AM   MICROALBUR 1.5 12/29/2019 10:09 AM   MICROALBUR 1.3 07/29/2018 09:47 AM    Kidney Function Lab Results  Component Value Date/Time   CREATININE 1.32 (H) 12/29/2019 10:09 AM   CREATININE 1.14 (H) 08/12/2019 09:33 AM   CREATININE 0.97 05/09/2013 08:23 PM   GFR 39.80 (L) 12/29/2019 10:09 AM   GFRNONAA 47 (L) 08/12/2019 09:33 AM   GFRNONAA >60 05/09/2013 08:23 PM   GFRAA 55 (L) 08/12/2019 09:33 AM   GFRAA >60 05/09/2013 08:23 PM   Contacted patient on 10/13/2020 to discuss diabetes disease state.   Current antihyperglycemic regimen:  Januvia 100 mg - 1 tablet daily Actos 30 mg - 1 tablet daily  Metformin 500 mg XR - 2 tablets (1000 mg) with breakfast Glipizide 10 mg XL - 2 tablets daily with breakfast    What diet changes have been made to improve diabetes  control? Patient states she is really watching and very cautious about what she is eating.   What recent interventions/DTPs have been made to improve glycemic control:  None noted  Have there been any recent hospitalizations or ED visits since last visit with CPP? No  Patient denies hypoglycemic symptoms, including Pale, Sweaty, Shaky, Hungry, Nervous/irritable, and Vision changes  Patient denies hyperglycemic symptoms, including blurry vision, excessive thirst, fatigue, polyuria, and weakness  How often are you checking your blood sugar? once daily and in the morning before eating or drinking  What are your  blood sugars ranging? Patient states her sugar has been between around 92 -106 before she eats when she wakes up. Patient does not keep a log.   During the week, how often does your blood glucose drop below 70? One time when she had Covid. Husband called the ambulance. They gave her a shot and she was fine right after that.   Are you checking your feet daily/regularly? No  Adherence Review: Is the patient currently on a STATIN medication? No - previously on rosuvastatin, simvastatin, pt declines Is the patient currently on ACE/ARB medication? Yes Does the patient have >5 day gap between last estimated fill dates? No  Care Gaps: Annual wellness visit in last year? Yes 01/05/2020 Most recent A1C reading: 6.0 on 07/05/2020 Most Recent BP reading: 140/75 on 07/15/2020  Last eye exam / retinopathy screening: 07/29/2018 Last diabetic foot exam: 01/05/2020  Counseled patient on importance of annual eye and foot exam.   Star Rating Drugs:  Medication:   Last Fill: Day Supply Losartan 132m           10/12/2020 90 Metformin XR 505m 09/30/2020 90 Januvia 10081m08/22/2022 90 Actos 63m17m                          09/13/2020 90 Glipizide 10mg2m                    10/12/2020 90  No appointments scheduled within the next 30 days.  MicheDebbora Dus notified  [name] MMarijean Niemann CUtahical Pharmacy Assistant [phone] reviewed the care management and care coordination activities outlined in this encounter and I am certifying that I agree with the content of this note. No further action needed.  MicheDebbora DusrmD Clinical Pharmacist [phone]

## 2020-10-26 ENCOUNTER — Ambulatory Visit (INDEPENDENT_AMBULATORY_CARE_PROVIDER_SITE_OTHER): Payer: Medicare Other

## 2020-10-26 DIAGNOSIS — I495 Sick sinus syndrome: Secondary | ICD-10-CM | POA: Diagnosis not present

## 2020-10-27 ENCOUNTER — Other Ambulatory Visit: Payer: Self-pay | Admitting: Internal Medicine

## 2020-10-27 LAB — CUP PACEART REMOTE DEVICE CHECK
Battery Remaining Longevity: 31 mo
Battery Voltage: 2.95 V
Brady Statistic AP VP Percent: 0.07 %
Brady Statistic AP VS Percent: 44.98 %
Brady Statistic AS VP Percent: 0.03 %
Brady Statistic AS VS Percent: 54.92 %
Brady Statistic RA Percent Paced: 44.88 %
Brady Statistic RV Percent Paced: 0.1 %
Date Time Interrogation Session: 20221005115958
Implantable Lead Implant Date: 20141208
Implantable Lead Implant Date: 20141208
Implantable Lead Location: 753859
Implantable Lead Location: 753860
Implantable Lead Model: 5076
Implantable Lead Model: 5076
Implantable Pulse Generator Implant Date: 20141208
Lead Channel Impedance Value: 323 Ohm
Lead Channel Impedance Value: 361 Ohm
Lead Channel Impedance Value: 361 Ohm
Lead Channel Impedance Value: 380 Ohm
Lead Channel Pacing Threshold Amplitude: 0.625 V
Lead Channel Pacing Threshold Amplitude: 0.875 V
Lead Channel Pacing Threshold Pulse Width: 0.4 ms
Lead Channel Pacing Threshold Pulse Width: 0.4 ms
Lead Channel Sensing Intrinsic Amplitude: 2.5 mV
Lead Channel Sensing Intrinsic Amplitude: 2.5 mV
Lead Channel Sensing Intrinsic Amplitude: 7.625 mV
Lead Channel Sensing Intrinsic Amplitude: 7.625 mV
Lead Channel Setting Pacing Amplitude: 2 V
Lead Channel Setting Pacing Amplitude: 2.5 V
Lead Channel Setting Pacing Pulse Width: 0.4 ms
Lead Channel Setting Sensing Sensitivity: 2 mV

## 2020-10-27 NOTE — Telephone Encounter (Addendum)
Prescription refill request for Eliquis received. Indication:Afib  Last office visit: 08/12/19 - Pt has scheduled appt with Dr Caryl Comes on 11/23/20  Scr: 1.32 (12/29/19)  Age: 75 Weight: 91.4kg   Appropriate dose and refill sent to requested pharmacy.

## 2020-11-03 NOTE — Progress Notes (Signed)
Remote pacemaker transmission.   

## 2020-11-05 ENCOUNTER — Telehealth: Payer: Self-pay | Admitting: Internal Medicine

## 2020-11-05 MED ORDER — APIXABAN 5 MG PO TABS: 5.0000 mg | ORAL_TABLET | Freq: Two times a day (BID) | ORAL | 0 refills | Status: DC

## 2020-11-05 NOTE — Telephone Encounter (Signed)
*  STAT* If patient is at the pharmacy, call can be transferred to refill team.   1. Which medications need to be refilled? (please list name of each medication and dose if known)  ELIQUIS 5 MG TABS tablet  2. Which pharmacy/location (including street and city if local pharmacy) is medication to be sent to? [id number] - [place], [place] [address]  3. Do they need a 30 day or 90 day supply?  30 day supply  Patient states she only has enough medication to get get through the weekend.

## 2020-11-05 NOTE — Telephone Encounter (Signed)
This is a [place] pt 

## 2020-11-05 NOTE — Telephone Encounter (Signed)
Pt is overdue to see the Dr . Soon will need blood work.  Pt scheduled to see Dr. Caryl Comes on 11/1. Put on appointment note pt will need updated blood work.    Refill was sent in on 10/6 for Eliquis 5mg  BID.   Called pt- unable to get in touch with her.   Called pharmacy, they have the Eliquis prescription on file but they are out of stock of Eliquis. Informed them that pt can have a 1 month supply of Eliquis (5mg  tabs, BID = 60 tabs) but after that no more refills until pt sees cardiologist and gets updated blood work. Pharmacy staff said they would make note of that and would let the patient know when the Eliquis was in stock. [facility] said if pt needs Eliquis then [facility] could [facility]   Called pt unable to get in touch with her  LMOM.  Called pt on home phone. Reminded her to go to her appointment with Dr. Caryl Comes on 11/1 and that she will need blood work at that visit. [facility] [place] Dr. Lorina Rabon.   Scr: 1.32, 12/29/2019 OV: 08/12/2019, Caryl Comes Weight: 91.2 kg  Age: 75 yo  [facility]

## 2020-11-05 NOTE — Telephone Encounter (Signed)
Refill request

## 2020-11-23 ENCOUNTER — Encounter: Payer: Self-pay | Admitting: Internal Medicine

## 2020-11-23 ENCOUNTER — Ambulatory Visit (INDEPENDENT_AMBULATORY_CARE_PROVIDER_SITE_OTHER): Payer: Medicare Other | Admitting: Internal Medicine

## 2020-11-23 ENCOUNTER — Other Ambulatory Visit: Payer: Self-pay

## 2020-11-23 VITALS — BP 120/72 | HR 73 | Ht 61.0 in | Wt 206.0 lb

## 2020-11-23 DIAGNOSIS — I5032 Chronic diastolic (congestive) heart failure: Secondary | ICD-10-CM | POA: Diagnosis not present

## 2020-11-23 DIAGNOSIS — I480 Paroxysmal atrial fibrillation: Secondary | ICD-10-CM

## 2020-11-23 DIAGNOSIS — Z950 Presence of cardiac pacemaker: Secondary | ICD-10-CM | POA: Diagnosis not present

## 2020-11-23 DIAGNOSIS — Z79899 Other long term (current) drug therapy: Secondary | ICD-10-CM

## 2020-11-23 DIAGNOSIS — I495 Sick sinus syndrome: Secondary | ICD-10-CM

## 2020-11-23 LAB — PACEMAKER DEVICE OBSERVATION

## 2020-11-23 MED ORDER — FUROSEMIDE 20 MG PO TABS: ORAL_TABLET | ORAL | 3 refills | Status: DC

## 2020-11-23 NOTE — Patient Instructions (Signed)
Medication Instructions:  - Your physician has recommended you make the following change in your medication:   1) STOP HCTZ (hydrochlorothiazide)  2) START Lasix (furosemide) 20 mg: - take 1 tablet (20 mg) by mouth once daily x 5 days, then - take 1 tablet (20 mg) by mouth once EVERY OTHER day  *If you need a refill on your cardiac medications before your next appointment, please call your pharmacy*   Lab Work: - Your physician recommends that you have lab work today: BMP/ CBC  If you have labs (blood work) drawn today and your tests are completely normal, you will receive your results only by: [place] (if you have [place]) OR A paper copy in the mail If you have any lab test that is abnormal or we need to change your treatment, we will call you to review the results.   Testing/Procedures: - none ordered   Follow-Up: At [facility], you and your health needs are our priority.  As part of our continuing mission to provide you with exceptional heart care, we have created designated Provider Care Teams.  These Care Teams include your primary Cardiologist (physician) and Advanced Practice Providers (APPs -  Physician Assistants and Nurse Practitioners) who all work together to provide you with the care you need, when you need it.  We recommend signing up for the patient portal called "MyChart".  Sign up information is provided on this After Visit Summary.  MyChart is used to connect with patients for Virtual Visits (Telemedicine).  Patients are able to view lab/test results, encounter notes, upcoming appointments, etc.  Non-urgent messages can be sent to your provider as well.   To learn more about what you can do with MyChart, go to [email or web address].    Your next appointment:   1 year(s)  The format for your next appointment:   In Person  Provider:   Virl Axe, MD   Other Instructions  LASIX (Furosemide) Tablets What is this medication? FUROSEMIDE (fyoor  OH se mide) treats high blood pressure. It may also be used to reduce swelling related to heart, kidney, or liver disease. It helps your kidneys remove more fluid and salt from your blood through the urine. It belongs to a group of medications called diuretics. This medicine may be used for other purposes; ask your health care provider or pharmacist if you have questions. COMMON BRAND NAME(S): Active-Medicated Specimen Kit, Delone, Diuscreen, Lasix, RX Specimen Collection Kit, Specimen Collection Kit, URINX Medicated Specimen Collection What should I tell my care team before I take this medication? They need to know if you have any of these conditions: Abnormal blood electrolytes Diarrhea or vomiting Gout Heart disease Kidney disease, small amounts of urine, or difficulty passing urine Liver disease Thyroid disease An unusual or allergic reaction to furosemide, sulfa medications, other medications, foods, dyes, or preservatives Pregnant or trying to get pregnant Breast-feeding How should I use this medication? Take this medication by mouth. Take it as directed on the prescription label at the same time every day. You can take it with or without food. If it upsets your stomach, take it with food. Keep taking it unless your care team tells you to stop. Talk to your care team about the use of this medication in children. Special care may be needed. Overdosage: If you think you have taken too much of this medicine contact a poison control center or emergency room at once. NOTE: This medicine is only for you. Do not share this medicine  with others. What if I miss a dose? If you miss a dose, take it as soon as you can. If it is almost time for your next dose, take only that dose. Do not take double or extra doses. What may interact with this medication? Aspirin and aspirin-like medications Certain antibiotics Chloral  hydrate Cisplatin Cyclosporine Digoxin Diuretics Laxatives Lithium Medications for blood pressure Medications that relax muscles for surgery Methotrexate NSAIDs, medications for pain and inflammation like ibuprofen, naproxen, or indomethacin Phenytoin Steroid medications like prednisone or cortisone Sucralfate Thyroid hormones This list may not describe all possible interactions. Give your health care provider a list of all the medicines, herbs, non-prescription drugs, or dietary supplements you use. Also tell them if you smoke, drink alcohol, or use illegal drugs. Some items may interact with your medicine. What should I watch for while using this medication? Visit your care team for regular checks on your progress. Check your blood pressure regularly. Ask your care team what your blood pressure should be, and when you should contact him or her. If you are a diabetic, check your blood sugar as directed. This medication may cause serious skin reactions. They can happen weeks to months after starting the medication. Contact your care team right away if you notice fevers or flu-like symptoms with a rash. The rash may be red or purple and then turn into blisters or peeling of the skin. Or, you might notice a red rash with swelling of the face, lips or lymph nodes in your neck or under your arms. You may need to be on a special diet while taking this medication. Check with your care team. Also, ask how many glasses of fluid you need to drink a day. You must not get dehydrated. You may get drowsy or dizzy. Do not drive, use machinery, or do anything that needs mental alertness until you know how this medication affects you. Do not stand or sit up quickly, especially if you are an older patient. This reduces the risk of dizzy or fainting spells. Alcohol can make you more drowsy and dizzy. Avoid alcoholic drinks. This medication can make you more sensitive to the sun. Keep out of the sun. If you cannot  avoid being in the sun, wear protective clothing and use sunscreen. Do not use sun lamps or tanning beds/booths. What side effects may I notice from receiving this medication? Side effects that you should report to your care team as soon as possible: Allergic reactions-skin rash, itching, hives, swelling of the face, lips, tongue, or throat Dehydration-increased thirst, dry mouth, feeling faint or lightheaded, headache, dark yellow or brown urine Hearing loss, ringing in ears High blood sugar (hyperglycemia)-increased thirst or amount of urine, unusual weakness or fatigue, blurry vision Low blood pressure-dizziness, feeling faint or lightheaded, blurry vision Low potassium level-muscle pain or cramps, unusual weakness or fatigue, fast or irregular heartbeat, constipation Side effects that usually do not require medical attention (report to your care team if they continue or are bothersome): Burning or tingling sensation in hands or feet Constipation Diarrhea Dizziness Headache This list may not describe all possible side effects. Call your doctor for medical advice about side effects. You may report side effects to FDA at 1-800-FDA-1088. Where should I keep my medication? Keep out of the reach of children and pets. Store at room temperature between 20 and 25 degrees C (68 and 77 degrees F). Protect from light and moisture. Keep the container tightly closed. Throw away any unused medication after the  expiration date. NOTE: This sheet is a summary. It may not cover all possible information. If you have questions about this medicine, talk to your doctor, pharmacist, or health care provider.  2022 Elsevier/Gold Standard (2020-03-23 09:44:48)

## 2020-11-23 NOTE — Progress Notes (Signed)
Patient Care Team: [name], MD as PCP - General Debbora Dus, [facility] as Pharmacist (Pharmacist)   HPI  Nina Carpenter is a 75 y.o. female Seen in follow-up for atrial fibrillation and tachybradysyndrome for which she underwent pacing 2014 Medtronic . PVI at [facility] 2015 There is been a marked diminution in atrial fibrillation symptoms and by device interrogation burden as well. She does have some nonsustained atrial tachycardia/slow flutter   On Apixoban  without bleeding     The patient denies chest pain, nocturnal dyspnea, orthopnea.  There have been no palpitations, lightheadedness or syncope.  Complains of Dyspnea on exertion without chest pain, some edema and poorly healing wounds on left leg.     DATE TEST EF   2/14 Echo   45-50 %   8/21 Echo  55-60%     Date Cr K Hgb A1c  3/19 0.83 4.6 14.6   7/20 0.99 4.7 14.3 8.0  7/21    6.4  12/21 1.32 4.0 12.4 6.0 (6/22)     Thromboembolic risk factors ( age -72, HTN-1, TIA/CVA-2, DM-1, Gender-1) for a CHADSVASc Score >=7   Past Medical History:  Diagnosis Date   Acid reflux disease    Asthma    a. Remotely.   Chronic combined systolic and diastolic CHF (congestive heart failure) ([place])    a. Felt related to AF. EF 45-50% by echo 12/2012.   CVA (cerebral vascular accident) ([place])    a. Thromboemoblic L MCA CVA 59/9774 with no residual deficit except mild occasional word finding. b. 1-42% RICA/LICA by dopplers 39/5/32.   GERD (gastroesophageal reflux disease)    H/O medication noncompliance    a. Per PCP note 01/06/13: Previously refused anything greater than aspirin, refused treatment for DM, and often stopped hypertensive meds and lipid meds.   Hx of rheumatic fever    a. As a child.   Hyperlipidemia    Hypertension    Kidney stones    a. Remotely.   PAF (paroxysmal atrial fibrillation) ([place])    a. Dx 09/2012, chronicity unclear at that time. Initially refused Xarelto due to risk of bleeding. b.  Sustained CVA 12/2012 - agreeable to taking Eliquis. Tachybrady syndrome during that admission s/p Medtronic PPM implantation;  c. Failed flecainide, sotalol, amiodarone (itching), and propafenone.   Tachy-brady syndrome ([place])    a. AF RVR with pauses >5sec requiring Medtronic PPM 12/2012.   Tubular adenoma of colon 05/2008   Type 2 diabetes mellitus ([place])     Past Surgical History:  Procedure Laterality Date   CHOLECYSTECTOMY     DESTRUCTION TRIGEMINAL NERVE VIA NEUROLYTIC AGENT  NOv @013    Gamma Knife    ENDOMETRIAL ABLATION     INSERT / REPLACE / REMOVE PACEMAKER     PACEMAKER INSERTION  12-30-12   MDT dual chamber pacemaker implanted by Dr Caryl Comes for tachy-brady syndrome   PERMANENT PACEMAKER INSERTION N/A 12/30/2012   Procedure: PERMANENT PACEMAKER INSERTION;  Surgeon: Deboraha Sprang, MD;  Location: [facility] CATH LAB;  Service: Cardiovascular;  Laterality: N/A;   TUBAL LIGATION     ectopic preganacy     Current Outpatient Medications  Medication Sig Dispense Refill   Accu-Chek FastClix Lancets MISC USE TO CHECK BLOOD SUGAR ONCE EVERY 2 WEEKS 102 each 0   apixaban (ELIQUIS) 5 MG TABS tablet Take 1 tablet (5 mg total) by mouth 2 (two) times daily. 60 tablet 0   Blood Glucose Calibration (ACCU-CHEK GUIDE CONTROL) LIQD 1  each by In Vitro route as directed. 1 each 0   Blood Glucose Monitoring Suppl (ACCU-CHEK GUIDE ME) w/Device KIT 1 each by Does not apply route every 14 (fourteen) days. Use to check FBS and 2 HR PP once every 2 weeks 1 kit 0   glipiZIDE (GLUCOTROL XL) 10 MG 24 hr tablet TAKE 2 TABLETS BY MOUTH EVERY DAY WITH BREAKFAST 180 tablet 1   glucose blood (ACCU-CHEK GUIDE) test strip Use as instructed 50 strip 3   hydrochlorothiazide (HYDRODIURIL) 25 MG tablet TAKE 1 TABLET(25 MG) BY MOUTH DAILY 90 tablet 1   losartan (COZAAR) 100 MG tablet TAKE 1 TABLET(100 MG) BY MOUTH DAILY 90 tablet 1   metFORMIN (GLUCOPHAGE-XR) 500 MG 24 hr tablet TAKE 2 TABLETS(1000 MG) BY MOUTH DAILY WITH  BREAKFAST 60 tablet 5   metoprolol tartrate (LOPRESSOR) 100 MG tablet TAKE 1 TABLET(100 MG) BY MOUTH TWICE DAILY 180 tablet 1   mometasone (NASONEX) 50 MCG/ACT nasal spray Place 2 sprays into the nose daily as needed.     montelukast (SINGULAIR) 10 MG tablet Take 10 mg by mouth at bedtime as needed.     pioglitazone (ACTOS) 30 MG tablet TAKE 1 TABLET(30 MG) BY MOUTH DAILY 90 tablet 1   sitaGLIPtin (JANUVIA) 100 MG tablet TAKE 1 TABLET(100 MG) BY MOUTH DAILY 90 tablet 3   triamcinolone (KENALOG) 0.1 % APPLY TOPICALLY TWICE DAILY 454 g 0   No current facility-administered medications for this visit.    Allergies  Allergen Reactions   Bee Venom Anaphylaxis   Amiodarone Itching   Crestor [Rosuvastatin]     myalgia   Fish Allergy Swelling    Shell fish   Iodine Itching    itching   Lipitor [Atorvastatin]     myalgias   Penicillins Hives and Itching   Ace Inhibitors Cough   Lidocaine Hcl Palpitations    ALL CAINES given during dental procedures    Review of Systems negative except from HPI and PMH  Physical Exam  BP 120/72 (BP Location: Left Arm, Patient Position: Sitting, Cuff Size: Normal)   Pulse 73   Ht 5' 1"  (1.549 m)   Wt 206 lb (93.4 kg)   SpO2 97%   BMI 38.92 kg/m  Well developed and well nourished in no acute distress HENT normal Neck supple with JVP-10 Clear Device pocket well healed; without hematoma or erythema.  There is no tethering  Regular rate and rhythm, no  gallop No  murmur Abd-soft with active BS No Clubbing cyanosis 2+ edema Skin-warm and dry A & Oriented  Grossly normal sensory and motor function  ECG Apacing @ 73. 21/08/41   Assessment and  Plan  Atrial fibrillation-paroxysmal status post PVI [facility] 4/15  Hypertension  Hyperlipidemia  Sinus node dysfunction  Diabetes.  LV dysfunction-recovered  Heart failure- diastolic chronic  Pacemaker Medtronic    Volume overloaded.  Discussed the importance of salt restriction.  We will  discontinue hydrochlorothiazide and put her on furosemide 20.  Have her take it daily x5 days and every other day.  She is to call us if it is inadequate.  On anticoagulation with apixaban.  Continue at 5 mg twice daily.  Last hemoglobin was down; we will recheck it today  No interval atrial fibrillation of note  Blood pressure well controlled.  Continue Cozaar 100 and metoprolol 100 twice daily device function normal  Device function normal

## 2020-11-24 ENCOUNTER — Telehealth: Payer: Self-pay | Admitting: Family Medicine

## 2020-11-24 LAB — CBC
Hematocrit: 37.9 % (ref 34.0–46.6)
Hemoglobin: 12.5 g/dL (ref 11.1–15.9)
MCH: 30.1 pg (ref 26.6–33.0)
MCHC: 33 g/dL (ref 31.5–35.7)
MCV: 91 fL (ref 79–97)
Platelets: 240 10*3/uL (ref 150–450)
RBC: 4.15 x10E6/uL (ref 3.77–5.28)
RDW: 12.8 % (ref 11.7–15.4)
WBC: 6.4 10*3/uL (ref 3.4–10.8)

## 2020-11-24 LAB — BASIC METABOLIC PANEL WITH GFR
BUN/Creatinine Ratio: 21 (ref 12–28)
BUN: 29 mg/dL — ABNORMAL HIGH (ref 8–27)
CO2: 18 mmol/L — ABNORMAL LOW (ref 20–29)
Calcium: 9.4 mg/dL (ref 8.7–10.3)
Chloride: 101 mmol/L (ref 96–106)
Creatinine, Ser: 1.37 mg/dL — ABNORMAL HIGH (ref 0.57–1.00)
Glucose: 157 mg/dL — ABNORMAL HIGH (ref 70–99)
Potassium: 4.5 mmol/L (ref 3.5–5.2)
Sodium: 140 mmol/L (ref 134–144)
eGFR: 40 mL/min/{1.73_m2} — ABNORMAL LOW

## 2020-11-24 NOTE — Telephone Encounter (Signed)
Please triage.  Probably needs to be seen in person for evaluation.

## 2020-11-24 NOTE — Telephone Encounter (Signed)
Pt called in stating that she has blisters on left leg and they are oozing out and itching  The medication sitagliptin she thinks is causing the reaction

## 2020-11-24 NOTE — Telephone Encounter (Signed)
agree

## 2020-11-24 NOTE — Telephone Encounter (Signed)
I spoke with pt;pt said the blisters  on lt leg have been there for 6 months or less. Pt has been taking sitagliptin for over 1 yr. Pt said the blisters have been oozing a clear liquid for 4 months. Pt said said lt ankle started swelling 6 months ago. Pt said she does see redness above foot on lt ankle.does not hurt but has been itching for at least 6 months. Pt scheduled in office appt on 11/25/20 at 11 AM with Dr Lorelei Pont at [facility] office. UC & ED precautions given and pt voiced understanding. Sending note to Dr Lorelei Pont and Butch Penny CMA. Pt wanted to know if should stop Januvia; advised pt to ck with Dr Lorelei Pont on 11/25/20; pt has been taking the Januvia for at least 1 year and pt has had blisters for 6 months. No difficulty breathing and no swelling at neck or throat.

## 2020-11-25 ENCOUNTER — Ambulatory Visit (INDEPENDENT_AMBULATORY_CARE_PROVIDER_SITE_OTHER): Payer: Medicare Other | Admitting: Family Medicine

## 2020-11-25 ENCOUNTER — Encounter: Payer: Self-pay | Admitting: Family Medicine

## 2020-11-25 VITALS — BP 140/60 | HR 87 | Temp 97.1°F | Ht 61.0 in | Wt 204.1 lb

## 2020-11-25 DIAGNOSIS — I495 Sick sinus syndrome: Secondary | ICD-10-CM

## 2020-11-25 DIAGNOSIS — E1159 Type 2 diabetes mellitus with other circulatory complications: Secondary | ICD-10-CM

## 2020-11-25 DIAGNOSIS — L03116 Cellulitis of left lower limb: Secondary | ICD-10-CM

## 2020-11-25 DIAGNOSIS — R600 Localized edema: Secondary | ICD-10-CM

## 2020-11-25 MED ORDER — DOXYCYCLINE HYCLATE 100 MG PO TABS: 100.0000 mg | ORAL_TABLET | Freq: Two times a day (BID) | ORAL | 0 refills | Status: DC

## 2020-11-25 MED ORDER — TRIAMCINOLONE ACETONIDE 0.1 % EX CREA: 1.0000 "application " | TOPICAL_CREAM | Freq: Two times a day (BID) | CUTANEOUS | 0 refills | Status: DC

## 2020-11-25 MED ORDER — PREDNISONE 20 MG PO TABS: ORAL_TABLET | ORAL | 0 refills | Status: DC

## 2020-11-25 NOTE — Progress Notes (Signed)
Nina Moss T. Nina Shimkus, MD, [place] at [facility] [place], [address]  Phone: [phone]  FAX: [phone]  Nina Carpenter - 75 y.o. female  MRN [id number]  Date of Birth: 12-06-1945  Date: 11/25/2020  PCP: [name], MD  Referral: [name], MD  Chief Complaint  Patient presents with   Leg Swelling   Rash    Leg/Back-Itchy    This visit occurred during the SARS-CoV-2 public health emergency.  Safety protocols were in place, including screening questions prior to the visit, additional usage of staff PPE, and extensive cleaning of exam room while observing appropriate contact time as indicated for disinfecting solutions.   Subjective:   Nina Carpenter is a 75 y.o. very pleasant female patient with Body mass index is 38.57 kg/m. who presents with the following:  LE Edema: Dr. Caryl Comes saw her 2 days ago and he discontinued her hydrochlorothiazide is going to have her take Lasix daily for 5 days and then transition to every other days.  She has had a rash on her lower extremities and to a lesser extent her back for approximately 4 months.  She has tried different antifungal's, and some occasional triamcinolone.  Today this does appear worse and has some surrounding redness on the left lower extremity.  Worried about  itching with Januvia.  Questions if this could be the cause of her rash.  Metformin 1000 mg daily Glipizide max Can increase actos to 45 mg  Lab Results  Component Value Date   HGBA1C 6.0 (A) 07/05/2020     Review of Systems is noted in the HPI, as appropriate  Objective:   BP 140/60   Pulse 87   Temp (!) 97.1 F (36.2 C) (Temporal)   Ht 5' 1"  (1.549 m)   Wt 204 lb 2 oz (92.6 kg)   SpO2 99%   BMI 38.57 kg/m   GEN: No acute distress; alert,appropriate. PULM: Breathing comfortably in no respiratory distress PSYCH: Normally interactive.        Laboratory and  Imaging Data:  Assessment and Plan:     ICD-10-CM   1. Left leg cellulitis  L03.116     2. Bilateral lower extremity edema  R60.0     3. Tachy-brady syndrome (HCC)  I49.5     4. Type 2 diabetes mellitus with vascular disease (HCC)  E11.59      This does appear to be infected right now, and edema with open seeping wounds would likely be the culprit.  I am going to place the patient on some doxycycline now.  Hopefully some topical and oral steroids will help calm down the rash.  This does not have the appearance of the Januvia rash, but this cannot be excluded.  For now, I am going to have her stop Januvia and increase her metformin.  I think that her diabetes will be able to handle her steroid dosing.  Patient Instructions  Stop your Januvia  Only put the triamcinalone cream on it.  Do not cover it with an ace bandage, tissue paper, or anything else.  Let it air dry as much as you can.  Increase the Metformin to 3 tables a day   Meds ordered this encounter  Medications   doxycycline (VIBRA-TABS) 100 MG tablet    Sig: Take 1 tablet (100 mg total) by mouth 2 (two) times daily.    Dispense:  20 tablet    Refill:  0  predniSONE (DELTASONE) 20 MG tablet    Sig: 2 tabs po for 4 days, then 1 tab po for 4 days    Dispense:  12 tablet    Refill:  0   triamcinolone cream (KENALOG) 0.1 %    Sig: Apply 1 application topically 2 (two) times daily.    Dispense:  454 g    Refill:  0   Medications Discontinued During This Encounter  Medication Reason   sitaGLIPtin (JANUVIA) 100 MG tablet    triamcinolone (KENALOG) 0.1 %    No orders of the defined types were placed in this encounter.   Follow-up: Return in about 1 month (around 12/25/2020) for diabetes.  Dragon [employer] One speech-to-text software was used for transcription in this dictation.  Possible transcriptional errors can occur using Editor, commissioning.   Signed,  Maud Deed. Pricilla Moehle, MD   Outpatient Encounter  Medications as of 11/25/2020  Medication Sig   Accu-Chek FastClix Lancets MISC USE TO CHECK BLOOD SUGAR ONCE EVERY 2 WEEKS   apixaban (ELIQUIS) 5 MG TABS tablet Take 1 tablet (5 mg total) by mouth 2 (two) times daily.   Blood Glucose Calibration (ACCU-CHEK GUIDE CONTROL) LIQD 1 each by In Vitro route as directed.   Blood Glucose Monitoring Suppl (ACCU-CHEK GUIDE ME) w/Device KIT 1 each by Does not apply route every 14 (fourteen) days. Use to check FBS and 2 HR PP once every 2 weeks   doxycycline (VIBRA-TABS) 100 MG tablet Take 1 tablet (100 mg total) by mouth 2 (two) times daily.   furosemide (LASIX) 20 MG tablet Take 1 tablet (20 mg) by mouth once every other day   glipiZIDE (GLUCOTROL XL) 10 MG 24 hr tablet TAKE 2 TABLETS BY MOUTH EVERY DAY WITH BREAKFAST   glucose blood (ACCU-CHEK GUIDE) test strip Use as instructed   losartan (COZAAR) 100 MG tablet TAKE 1 TABLET(100 MG) BY MOUTH DAILY   metFORMIN (GLUCOPHAGE-XR) 500 MG 24 hr tablet TAKE 2 TABLETS(1000 MG) BY MOUTH DAILY WITH BREAKFAST   metoprolol tartrate (LOPRESSOR) 100 MG tablet TAKE 1 TABLET(100 MG) BY MOUTH TWICE DAILY   mometasone (NASONEX) 50 MCG/ACT nasal spray Place 2 sprays into the nose daily as needed.   montelukast (SINGULAIR) 10 MG tablet Take 10 mg by mouth at bedtime as needed.   pioglitazone (ACTOS) 30 MG tablet TAKE 1 TABLET(30 MG) BY MOUTH DAILY   predniSONE (DELTASONE) 20 MG tablet 2 tabs po for 4 days, then 1 tab po for 4 days   triamcinolone cream (KENALOG) 0.1 % Apply 1 application topically 2 (two) times daily.   [DISCONTINUED] sitaGLIPtin (JANUVIA) 100 MG tablet TAKE 1 TABLET(100 MG) BY MOUTH DAILY   [DISCONTINUED] triamcinolone (KENALOG) 0.1 % APPLY TOPICALLY TWICE DAILY   No facility-administered encounter medications on file as of 11/25/2020.

## 2020-11-25 NOTE — Patient Instructions (Addendum)
Stop your Januvia  Only put the triamcinalone cream on it.  Do not cover it with an ace bandage, tissue paper, or anything else.  Let it air dry as much as you can.  Increase the Metformin to 3 tables a day

## 2020-11-26 ENCOUNTER — Telehealth: Payer: Self-pay | Admitting: Family Medicine

## 2020-11-26 NOTE — Telephone Encounter (Signed)
We spoke. I am not overly concerned with an a1c of 6 and excellent glucose control.  She was worried, since her BS increased to 400 while on steroids and Covid.  I suggested dropping dose to 20 mg x 7 days.

## 2020-11-26 NOTE — Telephone Encounter (Signed)
Pt called stating that Dr Lorelei Pont prescribed her prednisone. Pt states that she forgot to tell Dr Lorelei Pont that the medication makes her sugar level high.Please advise.

## 2020-11-29 ENCOUNTER — Telehealth: Payer: Self-pay | Admitting: Internal Medicine

## 2020-11-29 NOTE — Telephone Encounter (Signed)
Deboraha Sprang, MD  11/27/2020  1:19 PM EDT     Please Inform Patient   Labs are normal x mild renal function   Thanks

## 2020-11-29 NOTE — Telephone Encounter (Signed)
Attempted to call the patient. No answer- I left a detailed message of results on her cell voice mail (ok per DPR).

## 2020-12-02 ENCOUNTER — Other Ambulatory Visit: Payer: Self-pay | Admitting: Internal Medicine

## 2020-12-02 NOTE — Telephone Encounter (Signed)
Eliquis 5mg  refill request received. Patient is 75 years old, weight-92.6kg, Crea-1.37 on 11/23/2020, Diagnosis-Afib, and last seen by Dr. Caryl Comes on 11/23/2020. Dose is appropriate based on dosing criteria. Will send in refill to requested pharmacy.

## 2020-12-08 ENCOUNTER — Telehealth: Payer: Self-pay | Admitting: Family Medicine

## 2020-12-08 MED ORDER — METFORMIN HCL ER 500 MG PO TB24
1500.0000 mg | ORAL_TABLET | Freq: Every day | ORAL | 5 refills | Status: DC
Start: 2020-12-08 — End: 2021-06-16

## 2020-12-08 NOTE — Telephone Encounter (Signed)
Pt called in stated the pharmacy will not refill her metFORMIN (GLUCOPHAGE-XR) 500 MG 24 hr tablet . With the new increase dosage will need a new proscription sent to [facility]. Please advise [phone]

## 2020-12-08 NOTE — Telephone Encounter (Signed)
New Rx for Metformin XR 500 mg to take 3 tablets daily with breakfast sent in to [facility] as requested.   Left message for Mrs. Derrig that updated prescription has been sent to her pharmacy.

## 2020-12-20 ENCOUNTER — Telehealth: Payer: Self-pay | Admitting: Family Medicine

## 2020-12-20 NOTE — Telephone Encounter (Signed)
Pt called stating that medication pioglitazone (ACTOS) 30 MG tablet is making her itch and break out in hives. Pt would like to be advise on what to do. Please advise.

## 2020-12-21 NOTE — Telephone Encounter (Signed)
OK.  There must have been miscommunication?  Will discuss all with her in the office.

## 2020-12-21 NOTE — Telephone Encounter (Signed)
Mrs. Rynders notified as instructed by telephone.  She states she didn't say Actos, she states she told the person on the phone glipizide. She states she takes Glipizide XL 10 mg-2 tablets daily. She did not take it yesterday.  She just took her Metformin. She states her FBS yesterday was 164 mg/dl and 2 hours after taking her Metformin, it was 116 mg/dl.  I advised patient to continue holding her glipizide.  She already has an appointment scheduled with Dr. Lorelei Pont on 01/03/2021 so will keep appointment as scheduled.  She has been using triamcinolone cream on the rash which she thinks has helped some.   FYI to Dr. Lorelei Pont.

## 2020-12-21 NOTE — Telephone Encounter (Signed)
Please call  She has been on it for a while.  She was also worried about Januvia.  Have her stop the actos, too, and follow-up with me in 3 months.  If it does not go away, very doubtful from either of these medications. Marland Kitchen

## 2020-12-30 LAB — CUP PACEART INCLINIC DEVICE CHECK
Battery Remaining Longevity: 32 mo
Battery Voltage: 2.95 V
Brady Statistic AP VP Percent: 0.09 %
Brady Statistic AP VS Percent: 47.36 %
Brady Statistic AS VP Percent: 0.03 %
Brady Statistic AS VS Percent: 52.52 %
Brady Statistic RA Percent Paced: 47.33 %
Brady Statistic RV Percent Paced: 0.12 %
Date Time Interrogation Session: 20221101105300
Implantable Lead Implant Date: 20141208
Implantable Lead Implant Date: 20141208
Implantable Lead Location: 753859
Implantable Lead Location: 753860
Implantable Lead Model: 5076
Implantable Lead Model: 5076
Implantable Pulse Generator Implant Date: 20141208
Lead Channel Impedance Value: 323 Ohm
Lead Channel Impedance Value: 342 Ohm
Lead Channel Impedance Value: 342 Ohm
Lead Channel Impedance Value: 380 Ohm
Lead Channel Pacing Threshold Amplitude: 0.75 V
Lead Channel Pacing Threshold Amplitude: 0.75 V
Lead Channel Pacing Threshold Pulse Width: 0.4 ms
Lead Channel Pacing Threshold Pulse Width: 0.4 ms
Lead Channel Sensing Intrinsic Amplitude: 2 mV
Lead Channel Sensing Intrinsic Amplitude: 2.375 mV
Lead Channel Sensing Intrinsic Amplitude: 5.75 mV
Lead Channel Sensing Intrinsic Amplitude: 8.25 mV
Lead Channel Setting Pacing Amplitude: 2 V
Lead Channel Setting Pacing Amplitude: 2.5 V
Lead Channel Setting Pacing Pulse Width: 0.4 ms
Lead Channel Setting Sensing Sensitivity: 2 mV

## 2021-01-03 ENCOUNTER — Encounter: Payer: Self-pay | Admitting: Family Medicine

## 2021-01-03 ENCOUNTER — Other Ambulatory Visit: Payer: Self-pay

## 2021-01-03 ENCOUNTER — Ambulatory Visit (INDEPENDENT_AMBULATORY_CARE_PROVIDER_SITE_OTHER): Payer: Medicare Other | Admitting: Family Medicine

## 2021-01-03 VITALS — BP 142/80 | HR 79 | Temp 98.4°F | Resp 96 | Ht 61.5 in | Wt 204.2 lb

## 2021-01-03 DIAGNOSIS — Z0001 Encounter for general adult medical examination with abnormal findings: Secondary | ICD-10-CM | POA: Diagnosis not present

## 2021-01-03 DIAGNOSIS — Z1211 Encounter for screening for malignant neoplasm of colon: Secondary | ICD-10-CM

## 2021-01-03 DIAGNOSIS — Z79899 Other long term (current) drug therapy: Secondary | ICD-10-CM

## 2021-01-03 DIAGNOSIS — Z23 Encounter for immunization: Secondary | ICD-10-CM

## 2021-01-03 DIAGNOSIS — E1159 Type 2 diabetes mellitus with other circulatory complications: Secondary | ICD-10-CM

## 2021-01-03 DIAGNOSIS — E559 Vitamin D deficiency, unspecified: Secondary | ICD-10-CM | POA: Diagnosis not present

## 2021-01-03 DIAGNOSIS — E785 Hyperlipidemia, unspecified: Secondary | ICD-10-CM | POA: Diagnosis not present

## 2021-01-03 DIAGNOSIS — I10 Essential (primary) hypertension: Secondary | ICD-10-CM

## 2021-01-03 LAB — HEMOGLOBIN A1C: Hgb A1c MFr Bld: 6.6 % — ABNORMAL HIGH (ref 4.6–6.5)

## 2021-01-03 LAB — CBC WITH DIFFERENTIAL/PLATELET
Basophils Absolute: 0 10*3/uL (ref 0.0–0.1)
Basophils Relative: 1 % (ref 0.0–3.0)
Eosinophils Absolute: 0.1 10*3/uL (ref 0.0–0.7)
Eosinophils Relative: 1.7 % (ref 0.0–5.0)
HCT: 36.9 % (ref 36.0–46.0)
Hemoglobin: 12.1 g/dL (ref 12.0–15.0)
Lymphocytes Relative: 25.9 % (ref 12.0–46.0)
Lymphs Abs: 1.2 10*3/uL (ref 0.7–4.0)
MCHC: 32.8 g/dL (ref 30.0–36.0)
MCV: 90.7 fl (ref 78.0–100.0)
Monocytes Absolute: 0.4 10*3/uL (ref 0.1–1.0)
Monocytes Relative: 9.3 % (ref 3.0–12.0)
Neutro Abs: 3 10*3/uL (ref 1.4–7.7)
Neutrophils Relative %: 62.1 % (ref 43.0–77.0)
Platelets: 243 10*3/uL (ref 150.0–400.0)
RBC: 4.06 Mil/uL (ref 3.87–5.11)
RDW: 14.3 % (ref 11.5–15.5)
WBC: 4.8 10*3/uL (ref 4.0–10.5)

## 2021-01-03 LAB — BASIC METABOLIC PANEL
BUN: 25 mg/dL — ABNORMAL HIGH (ref 6–23)
CO2: 25 mEq/L (ref 19–32)
Calcium: 9.7 mg/dL (ref 8.4–10.5)
Chloride: 105 mEq/L (ref 96–112)
Creatinine, Ser: 1.24 mg/dL — ABNORMAL HIGH (ref 0.40–1.20)
GFR: 42.59 mL/min — ABNORMAL LOW (ref >60.00)
Glucose, Bld: 178 mg/dL — ABNORMAL HIGH (ref 70–99)
Potassium: 4.9 mEq/L (ref 3.5–5.1)
Sodium: 140 mEq/L (ref 135–145)

## 2021-01-03 LAB — LIPID PANEL
Cholesterol: 224 mg/dL — ABNORMAL HIGH (ref 0–200)
HDL: 48.1 mg/dL (ref >39.00)
LDL Cholesterol: 137 mg/dL — ABNORMAL HIGH (ref 0–99)
NonHDL: 176.36
Total CHOL/HDL Ratio: 5
Triglycerides: 198 mg/dL — ABNORMAL HIGH (ref 0.0–149.0)
VLDL: 39.6 mg/dL (ref 0.0–40.0)

## 2021-01-03 LAB — HEPATIC FUNCTION PANEL
ALT: 16 U/L (ref 0–35)
AST: 15 U/L (ref 0–37)
Albumin: 4.3 g/dL (ref 3.5–5.2)
Alkaline Phosphatase: 66 U/L (ref 39–117)
Bilirubin, Direct: 0.1 mg/dL (ref 0.0–0.3)
Total Bilirubin: 0.6 mg/dL (ref 0.2–1.2)
Total Protein: 6.9 g/dL (ref 6.0–8.3)

## 2021-01-03 LAB — MICROALBUMIN / CREATININE URINE RATIO
Creatinine,U: 127 mg/dL
Microalb Creat Ratio: 3.5 mg/g (ref 0.0–30.0)
Microalb, Ur: 4.4 mg/dL — ABNORMAL HIGH (ref 0.0–1.9)

## 2021-01-03 LAB — VITAMIN D 25 HYDROXY (VIT D DEFICIENCY, FRACTURES): VITD: 26.84 ng/mL — ABNORMAL LOW (ref 30.00–100.00)

## 2021-01-03 MED ORDER — ROSUVASTATIN CALCIUM 5 MG PO TABS: 5.0000 mg | ORAL_TABLET | Freq: Every day | ORAL | 3 refills | Status: DC

## 2021-01-03 NOTE — Progress Notes (Signed)
Nina Carpenter T. Nina Coachman, MD, [place] at [facility] [place], [address]  Phone: [phone]  FAX: [phone]  Nina Carpenter - 75 y.o. female  MRN [id number]  Date of Birth: 03-31-1945  Date: 01/03/2021  PCP: [name], MD  Referral: [name], MD  Chief Complaint  Patient presents with   Medicare Wellness    This visit occurred during the SARS-CoV-2 public health emergency.  Safety protocols were in place, including screening questions prior to the visit, additional usage of staff PPE, and extensive cleaning of exam room while observing appropriate contact time as indicated for disinfecting solutions.   Patient Care Team: [name], MD as PCP - General Debbora Dus, [facility] as Pharmacist (Pharmacist) Subjective:   Nina Carpenter is a 75 y.o. pleasant patient who presents for a medicare wellness examination: She also has multiple other chronic healthcare issues requiring ongoing management.  Health Maintenance Summary Reviewed and updated, unless pt declines services.  Tobacco History Reviewed. Non-smoker Alcohol: No concerns, no excessive use Exercise Habits: Some activity, rec at least 30 mins 5 times a week STD concerns: none Drug Use: None Birth control method: n/a Menses regular: n/a Lumps or breast concerns: no Breast Cancer Family History: no  Tetanus shot? Shingrix - will think about Colonoscopy - cologuard Eye exam - annually Flu vaccine - today  Stopped Januvia, feels better now.  Glipizide?? Reduced down to one   COVID-19 booster?,  Believe that she has declined this in the past. - will hold on it.   Diabetes Mellitus: Tolerating Medications: yes Compliance with diet: fair, Body mass index is 37.97 kg/m. Exercise: minimal / intermittent Avg blood sugars at home: good at home Foot problems: none Hypoglycemia: none No nausea, vomitting, blurred vision,  polyuria.  Lab Results  Component Value Date   HGBA1C 6.6 (H) 01/03/2021   HGBA1C 6.0 (A) 07/05/2020   HGBA1C 6.1 12/29/2019   Lab Results  Component Value Date   MICROALBUR 4.4 (H) 01/03/2021   LDLCALC 137 (H) 01/03/2021   CREATININE 1.24 (H) 01/03/2021    Wt Readings from Last 3 Encounters:  01/03/21 204 lb 4 oz (92.6 kg)  11/25/20 204 lb 2 oz (92.6 kg)  11/23/20 206 lb (93.4 kg)    HTN: Tolerating all medications without side effects Stable and at goal No CP, no sob. No HA.  BP Readings from Last 3 Encounters:  01/03/21 (!) 142/80  11/25/20 140/60  11/23/20 536/64    Basic Metabolic Panel: Lab Review:  CBC EXTENDED Latest Ref Rng & Units 01/03/2021 11/23/2020 12/29/2019  WBC 4.0 - 10.5 K/uL 4.8 6.4 5.7  RBC 3.87 - 5.11 Mil/uL 4.06 4.15 4.06  HGB 12.0 - 15.0 g/dL 12.1 12.5 12.4  HCT 36.0 - 46.0 % 36.9 37.9 37.3  PLT 150.0 - 400.0 K/uL 243.0 240 226.0  NEUTROABS 1.4 - 7.7 K/uL 3.0 - 3.5  LYMPHSABS 0.7 - 4.0 K/uL 1.2 - 1.6    BMP Latest Ref Rng & Units 01/03/2021 11/23/2020 12/29/2019  Glucose 70 - 99 mg/dL 178(H) 157(H) 89  BUN 6 - 23 mg/dL 25(H) 29(H) 30(H)  Creatinine 0.40 - 1.20 mg/dL 1.24(H) 1.37(H) 1.32(H)  BUN/Creat Ratio 12 - 28 - 21 -  Sodium 135 - 145 mEq/L 140 140 140  Potassium 3.5 - 5.1 mEq/L 4.9 4.5 4.0  Chloride 96 - 112 mEq/L 105 101 104  CO2 19 - 32 mEq/L 25 18(L) 23  Calcium 8.4 - 10.5  mg/dL 9.7 9.4 9.7    Vitamin D deficiency  Last vitamin D Lab Results  Component Value Date   VD25OH 26.84 (L) 01/03/2021     Health Maintenance  Topic Date Due   TETANUS/TDAP  Never done   Zoster Vaccines- Shingrix (1 of 2) Never done   DEXA SCAN  Never done   FOOT EXAM  04/24/2018   COLONOSCOPY (Pts 45-49yr Insurance coverage will need to be confirmed)  06/16/2018   OPHTHALMOLOGY EXAM  01/24/2019   COVID-19 Vaccine (3 - Pfizer risk series) 04/29/2019   HEMOGLOBIN A1C  07/04/2021   Pneumonia Vaccine 75 Years old  Completed   INFLUENZA VACCINE   Completed   Hepatitis C Screening  Completed   HPV VACCINES  Aged Out   Immunization History  Administered Date(s) Administered   Fluad Quad(high Dose 65+) 01/05/2020, 01/03/2021   Influenza Split 11/13/2011   Influenza, High Dose Seasonal PF 11/23/2016   Influenza,inj,Quad PF,6+ Mos 11/18/2012, 10/06/2013, 12/23/2014, 03/20/2016, 01/28/2018   PFIZER(Purple Top)SARS-COV-2 Vaccination 03/07/2019, 04/01/2019   Pneumococcal Conjugate-13 10/06/2013   Pneumococcal Polysaccharide-23 10/11/2010    Patient Active Problem List   Diagnosis Date Noted   History of stroke 01/06/2013    Priority: High   Atrial fibrillation ([place] 12/29/2012    Priority: High   Type 2 diabetes mellitus with vascular disease ([place] 05/04/2008    Priority: High   Cardiac pacemaker in situ 01/06/2013    Priority: Medium    Tachy-brady syndrome ([place] 12/29/2012    Priority: Medium    Hx of rheumatic fever 08/07/2011    Priority: Medium    NEURALGIA, TRIGEMINAL 01/26/2010    Priority: Medium    Hyperlipidemia LDL goal <70 05/04/2008    Priority: Medium    Essential hypertension 05/04/2008    Priority: Medium    ASTHMA 05/04/2008    Priority: Medium    OSA (obstructive sleep apnea) 05/25/2014   GERD 05/04/2008   Asthma 05/04/2008    Past Medical History:  Diagnosis Date   Acid reflux disease    Asthma    a. Remotely.   Chronic combined systolic and diastolic CHF (congestive heart failure) ([place]    a. Felt related to AF. EF 45-50% by echo 12/2012.   CVA (cerebral vascular accident) ([place]    a. Thromboemoblic L MCA CVA 135/0093with no residual deficit except mild occasional word finding. b. 18-18%RICA/LICA by dopplers 129/9/37   GERD (gastroesophageal reflux disease)    H/O medication noncompliance    a. Per PCP note 01/06/13: Previously refused anything greater than aspirin, refused treatment for DM, and often stopped hypertensive meds and lipid meds.   Hx of rheumatic fever    a. As a child.    Hyperlipidemia    Hypertension    Kidney stones    a. Remotely.   PAF (paroxysmal atrial fibrillation) ([place]    a. Dx 09/2012, chronicity unclear at that time. Initially refused Xarelto due to risk of bleeding. b. Sustained CVA 12/2012 - agreeable to taking Eliquis. Tachybrady syndrome during that admission s/p Medtronic PPM implantation;  c. Failed flecainide, sotalol, amiodarone (itching), and propafenone.   Tachy-brady syndrome ([place]    a. AF RVR with pauses >5sec requiring Medtronic PPM 12/2012.   Tubular adenoma of colon 05/2008   Type 2 diabetes mellitus ([place]     Past Surgical History:  Procedure Laterality Date   CHOLECYSTECTOMY     DESTRUCTION TRIGEMINAL NERVE VIA NEUROLYTIC AGENT  NOv @013    Gamma Knife  ENDOMETRIAL ABLATION     INSERT / REPLACE / REMOVE PACEMAKER     PACEMAKER INSERTION  12-30-12   MDT dual chamber pacemaker implanted by Dr Caryl Comes for tachy-brady syndrome   PERMANENT PACEMAKER INSERTION N/A 12/30/2012   Procedure: PERMANENT PACEMAKER INSERTION;  Surgeon: Deboraha Sprang, MD;  Location: [facility] CATH LAB;  Service: Cardiovascular;  Laterality: N/A;   TUBAL LIGATION     ectopic preganacy     Family History  Problem Relation Age of Onset   Stroke Mother    Diabetes Mother    Hypertension Mother     Past Medical History, Surgical History, Social History, Family History, Problem List, Medications, and Allergies have been reviewed and updated if relevant.  Review of Systems: Pertinent positives are listed above.  Otherwise, a full 14 point review of systems has been done in full and it is negative except where it is noted positive.  Objective:   BP (!) 142/80   Pulse 79   Temp 98.4 F (36.9 C) (Temporal)   Resp (!) 96   Ht 5' 1.5" (1.562 m)   Wt 204 lb 4 oz (92.6 kg)   BMI 37.97 kg/m  Fall Risk 03/20/2016 04/16/2017 07/15/2018 01/05/2020 01/03/2021  Falls in the past year? No No 0 1 0  Was there an injury with Fall? - - - 0 -  Fall Risk Category Calculator  - - - 1 -  Fall Risk Category - - - Low -  Patient Fall Risk Level - - - Low fall risk -  Patient at Risk for Falls Due to - - - No Fall Risks -  Fall risk Follow up - - - Falls evaluation completed -   Ideal Body Weight: Weight in (lb) to have BMI = 25: 134.2 Hearing Screening  Method: Audiometry   500Hz  1000Hz  2000Hz  4000Hz   Right ear 20 20 20 20   Left ear 20 20 20  0   Vision Screening   Right eye Left eye Both eyes  Without correction     With correction 20/30 20/30 20/30    Depression screen [facility] 2/9 01/03/2021 01/05/2020 07/15/2018 04/16/2017 03/20/2016  Decreased Interest 0 0 0 0 0  Down, Depressed, Hopeless 0 0 0 0 0  PHQ - 2 Score 0 0 0 0 0  Altered sleeping - - 0 0 -  Tired, decreased energy - - 0 0 -  Change in appetite - - 0 0 -  Feeling bad or failure about yourself  - - 0 0 -  Trouble concentrating - - 0 0 -  Moving slowly or fidgety/restless - - 0 0 -  Suicidal thoughts - - 0 0 -  PHQ-9 Score - - 0 0 -  Difficult doing work/chores - - Not difficult at all Not difficult at all -  Some recent data might be hidden     GEN: well developed, well nourished, no acute distress Eyes: conjunctiva and lids normal, PERRLA, EOMI ENT: TM clear, nares clear, oral exam WNL Neck: supple, no lymphadenopathy, no thyromegaly, no JVD Pulm: clear to auscultation and percussion, respiratory effort normal CV: regular rate and rhythm, S1-S2, no murmur, rub or gallop, no bruits Chest: no scars, masses, no lumps BREAST: defer GI: soft, non-tender; no hepatosplenomegaly, masses; active bowel sounds all quadrants GU: defer Lymph: no cervical, axillary or inguinal adenopathy MSK: gait normal, muscle tone and strength WNL, no joint swelling, effusions, discoloration, crepitus  SKIN: clear, good turgor, color WNL, no rashes, lesions, or ulcerations Neuro:  normal mental status, normal strength, sensation, and motion Psych: alert; oriented to person, place and time, normally interactive and  not anxious or depressed in appearance.  All labs reviewed with patient.   No results found.  Assessment and Plan:     ICD-10-CM   1. Encounter for health maintenance examination with abnormal findings  Z00.01     2. Type 2 diabetes mellitus with vascular disease (HCC)  E11.59 Hemoglobin A1c    Microalbumin / creatinine urine ratio    3. Hyperlipidemia LDL goal <70  E78.5 Lipid panel    4. Vitamin D deficiency  E55.9 VITAMIN D 25 Hydroxy (Vit-D Deficiency, Fractures)    5. Encounter for long-term current use of medication  G95.621 Basic metabolic panel    CBC with Differential/Platelet    Hepatic function panel    6. Essential hypertension  I10     7. Screen for colon cancer  Z12.11 Cologuard    8. Need for influenza vaccination  Z23 Flu Vaccine QUAD High Dose(Fluad)     Globally, she is doing fairly well.  Her diabetes is under good control even with stopping her Januvia and decreasing her glipizide.  She does feel better and her rash is resolved.?  Could have been involved with Januvia, nevertheless she feels better.  We will check all of her baseline labs now.  With her history of stroke as well as diabetes, I do think it is reasonable to rechallenge her with some low-dose Crestor to see how she does.  She has had myalgia in the past, but she is open to this.  Flu, but she is going to hold on additional vaccination.  Health Maintenance Exam: The patient's preventative maintenance and recommended screening tests for an annual wellness exam were reviewed in full today. Brought up to date unless services declined.  Counselled on the importance of diet, exercise, and its role in overall health and mortality. The patient's FH and SH was reviewed, including their home life, tobacco status, and drug and alcohol status.  Follow-up in 1 year for physical exam or additional follow-up below.  I have personally reviewed the Medicare Annual Wellness questionnaire and have  noted 1. The patient's medical and social history 2. Their use of alcohol, tobacco or illicit drugs 3. Their current medications and supplements 4. The patient's functional ability including ADL's, fall risks, home safety risks and hearing or visual             impairment. 5. Diet and physical activities 6. Evidence for depression or mood disorders 7. Reviewed Updated provider list, see scanned forms and [facility] Snapshot.  8. Reviewed whether or not the patient has HCPOA or living will, and discussed what this means with the patient.  Recommended she bring in a copy for his chart in [facility].  The patients weight, height, BMI and visual acuity have been recorded in the chart I have made referrals, counseling and provided education to the patient based review of the above and I have provided the pt with a written personalized care plan for preventive services.  I have provided the patient with a copy of your personalized plan for preventive services. Instructed to take the time to review along with their updated medication list.  Follow-up: No follow-ups on file.   Meds ordered this encounter  Medications   rosuvastatin (CRESTOR) 5 MG tablet    Sig: Take 1 tablet (5 mg total) by mouth daily.    Dispense:  90 tablet  Refill:  3    Medications Discontinued During This Encounter  Medication Reason   doxycycline (VIBRA-TABS) 100 MG tablet Completed Course   glipiZIDE (GLUCOTROL XL) 10 MG 24 hr tablet Duplicate   predniSONE (DELTASONE) 20 MG tablet Completed Course   Orders Placed This Encounter  Procedures   Flu Vaccine QUAD High Dose(Fluad)   Basic metabolic panel   CBC with Differential/Platelet   Hepatic function panel   Hemoglobin A1c   Microalbumin / creatinine urine ratio   Lipid panel   VITAMIN D 25 Hydroxy (Vit-D Deficiency, Fractures)   Cologuard    Signed,  Jahred Tatar T. Dion Sibal, MD  Current Meds  Medication Sig   Accu-Chek FastClix Lancets MISC USE TO CHECK BLOOD SUGAR  ONCE EVERY 2 WEEKS   apixaban (ELIQUIS) 5 MG TABS tablet TAKE 1 TABLET BY MOUTH TWICE A DAY   Blood Glucose Calibration (ACCU-CHEK GUIDE CONTROL) LIQD 1 each by In Vitro route as directed.   Blood Glucose Monitoring Suppl (ACCU-CHEK GUIDE ME) w/Device KIT 1 each by Does not apply route every 14 (fourteen) days. Use to check FBS and 2 HR PP once every 2 weeks   furosemide (LASIX) 20 MG tablet Take 1 tablet (20 mg) by mouth once every other day   glipiZIDE (GLUCOTROL XL) 10 MG 24 hr tablet Take 10 mg by mouth daily with breakfast.   glucose blood (ACCU-CHEK GUIDE) test strip Use as instructed   losartan (COZAAR) 100 MG tablet TAKE 1 TABLET(100 MG) BY MOUTH DAILY   metFORMIN (GLUCOPHAGE-XR) 500 MG 24 hr tablet Take 3 tablets (1,500 mg total) by mouth daily with breakfast.   metoprolol tartrate (LOPRESSOR) 100 MG tablet TAKE 1 TABLET(100 MG) BY MOUTH TWICE DAILY   mometasone (NASONEX) 50 MCG/ACT nasal spray Place 2 sprays into the nose daily as needed.   montelukast (SINGULAIR) 10 MG tablet Take 10 mg by mouth at bedtime as needed.   pioglitazone (ACTOS) 30 MG tablet TAKE 1 TABLET(30 MG) BY MOUTH DAILY   rosuvastatin (CRESTOR) 5 MG tablet Take 1 tablet (5 mg total) by mouth daily.   triamcinolone cream (KENALOG) 0.1 % Apply 1 application topically 2 (two) times daily.

## 2021-01-03 NOTE — Patient Instructions (Signed)
Check with your insurance to see if they will cover the shingles shot.  The newer [facility] shot is much better than the older shot. The [facility] shot requires 2 shots given 6 months apart.  Almost always, this shot is not covered in our office by your insurance. It costs 500 dollars, but essentially all insurances cover it at your pharmacy.  I would call your insurance number on your card to confirm this.

## 2021-01-13 NOTE — Progress Notes (Deleted)
Subjective:   Nina Carpenter is a 75 y.o. female who presents for Medicare Annual (Subsequent) preventive examination.  I connected with Venba Zenner today by telephone and verified that I am speaking with the correct person using two identifiers. Location patient: home Location provider: work Persons participating in the virtual visit: patient, Marine scientist.    I discussed the limitations, risks, security and privacy concerns of performing an evaluation and management service by telephone and the availability of in person appointments. I also discussed with the patient that there may be a patient responsible charge related to this service. The patient expressed understanding and verbally consented to this telephonic visit.    Interactive audio and video telecommunications were attempted between this provider and patient, however failed, due to patient having technical difficulties OR patient did not have access to video capability.  We continued and completed visit with audio only.  Some vital signs may be absent or patient reported.   Time Spent with patient on telephone encounter: *** minutes  Review of Systems           Objective:    There were no vitals filed for this visit. There is no height or weight on file to calculate BMI.  Advanced Directives 07/15/2018 04/16/2017 02/22/2013 01/28/2013 01/11/2013 01/03/2013 12/28/2012  Does Patient Have a Medical Advance Directive? No No Patient does not have advance directive;Patient would not like information Patient does not have advance directive Patient does not have advance directive Patient does not have advance directive Patient does not have advance directive  Would patient like information on creating a medical advance directive? No - Patient declined Yes (MAU/Ambulatory/Procedural Areas - Information given) - - - - -  Pre-existing out of facility DNR order (yellow form or pink MOST form) - - No No No No No    Current Medications  (verified) Outpatient Encounter Medications as of 01/18/2021  Medication Sig   Accu-Chek FastClix Lancets MISC USE TO CHECK BLOOD SUGAR ONCE EVERY 2 WEEKS   apixaban (ELIQUIS) 5 MG TABS tablet TAKE 1 TABLET BY MOUTH TWICE A DAY   Blood Glucose Calibration (ACCU-CHEK GUIDE CONTROL) LIQD 1 each by In Vitro route as directed.   Blood Glucose Monitoring Suppl (ACCU-CHEK GUIDE ME) w/Device KIT 1 each by Does not apply route every 14 (fourteen) days. Use to check FBS and 2 HR PP once every 2 weeks   furosemide (LASIX) 20 MG tablet Take 1 tablet (20 mg) by mouth once every other day   glipiZIDE (GLUCOTROL XL) 10 MG 24 hr tablet Take 10 mg by mouth daily with breakfast.   glucose blood (ACCU-CHEK GUIDE) test strip Use as instructed   losartan (COZAAR) 100 MG tablet TAKE 1 TABLET(100 MG) BY MOUTH DAILY   metFORMIN (GLUCOPHAGE-XR) 500 MG 24 hr tablet Take 3 tablets (1,500 mg total) by mouth daily with breakfast.   metoprolol tartrate (LOPRESSOR) 100 MG tablet TAKE 1 TABLET(100 MG) BY MOUTH TWICE DAILY   mometasone (NASONEX) 50 MCG/ACT nasal spray Place 2 sprays into the nose daily as needed.   montelukast (SINGULAIR) 10 MG tablet Take 10 mg by mouth at bedtime as needed.   pioglitazone (ACTOS) 30 MG tablet TAKE 1 TABLET(30 MG) BY MOUTH DAILY   rosuvastatin (CRESTOR) 5 MG tablet Take 1 tablet (5 mg total) by mouth daily.   triamcinolone cream (KENALOG) 0.1 % Apply 1 application topically 2 (two) times daily.   No facility-administered encounter medications on file as of 01/18/2021.    Allergies (  verified) Bee venom, Fish allergy, Penicillins, Amiodarone, Iodine, Ace inhibitors, Januvia [sitagliptin], Lidocaine hcl, and Lipitor [atorvastatin]   History: Past Medical History:  Diagnosis Date   Acid reflux disease    Asthma    a. Remotely.   Chronic combined systolic and diastolic CHF (congestive heart failure) ([place])    a. Felt related to AF. EF 45-50% by echo 12/2012.   CVA (cerebral vascular  accident) ([place])    a. Thromboemoblic L MCA CVA 32/9518 with no residual deficit except mild occasional word finding. b. 8-41% RICA/LICA by dopplers 66/0/63.   GERD (gastroesophageal reflux disease)    H/O medication noncompliance    a. Per PCP note 01/06/13: Previously refused anything greater than aspirin, refused treatment for DM, and often stopped hypertensive meds and lipid meds.   Hx of rheumatic fever    a. As a child.   Hyperlipidemia    Hypertension    Kidney stones    a. Remotely.   PAF (paroxysmal atrial fibrillation) ([place])    a. Dx 09/2012, chronicity unclear at that time. Initially refused Xarelto due to risk of bleeding. b. Sustained CVA 12/2012 - agreeable to taking Eliquis. Tachybrady syndrome during that admission s/p Medtronic PPM implantation;  c. Failed flecainide, sotalol, amiodarone (itching), and propafenone.   Tachy-brady syndrome ([place])    a. AF RVR with pauses >5sec requiring Medtronic PPM 12/2012.   Tubular adenoma of colon 05/2008   Type 2 diabetes mellitus ([place])    Past Surgical History:  Procedure Laterality Date   CHOLECYSTECTOMY     DESTRUCTION TRIGEMINAL NERVE VIA NEUROLYTIC AGENT  NOv @013    Gamma Knife    ENDOMETRIAL ABLATION     INSERT / REPLACE / REMOVE PACEMAKER     PACEMAKER INSERTION  12-30-12   MDT dual chamber pacemaker implanted by Dr Caryl Comes for tachy-brady syndrome   PERMANENT PACEMAKER INSERTION N/A 12/30/2012   Procedure: PERMANENT PACEMAKER INSERTION;  Surgeon: Deboraha Sprang, MD;  Location[facility] CATH LAB;  Service: Cardiovascular;  Laterality: N/A;   TUBAL LIGATION     ectopic preganacy    Family History  Problem Relation Age of Onset   Stroke Mother    Diabetes Mother    Hypertension Mother    Social History   Socioeconomic History   Marital status: Married    Spouse name: Not on file   Number of children: 2   Years of education: Not on file   Highest education level: Not on file  Occupational History   Occupation: cosmetologist     Employer: SELF EMPLOYED  Tobacco Use   Smoking status: Former    Packs/day: 2.00    Types: Cigarettes    Quit date: 01/24/1968    Years since quitting: 53.0   Smokeless tobacco: Never  Vaping Use   Vaping Use: Never used  Substance and Sexual Activity   Alcohol use: Yes    Alcohol/week: 1.0 standard drink    Types: 1 Glasses of wine per week    Comment: rare use   Drug use: No   Sexual activity: Yes    Birth control/protection: Post-menopausal  Other Topics Concern   Not on file  Social History Narrative   Lives in [place] with her husband.  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   Social Determinants of Health   Financial Resource Strain: Low Risk    Difficulty of Paying Living Expenses: Not very hard  Food Insecurity: Not on file  Transportation Needs: Not on file  Physical Activity: Not on file  Stress: Not on file  Social Connections: Not on file    Tobacco Counseling Counseling given: Not Answered   Clinical Intake:                Diabetes:  Is the patient diabetic?  Yes  If diabetic, was a CBG obtained today?  No , visit completed over the phone Did the patient bring in their glucometer from home?  No  How often do you monitor your CBG's? ***.   Financial Strains and Diabetes Management:  Are you having any financial strains with the device, your supplies or your medication? {YES/NO:21197}.  Does the patient want to be seen by Chronic Care Management for management of their diabetes?  {YES/NO:21197} Would the patient like to be referred to a Nutritionist or for Diabetic Management?  {YES/NO:21197}  Diabetic Exams:  Diabetic Eye Exam: Completed ***. Overdue for diabetic eye exam. Pt has been advised about the importance in completing this exam. A referral has been placed today. Message sent to referral coordinator for scheduling purposes. Advised pt to expect a call from our office re: appt.  Diabetic Foot Exam: Completed ***. Pt has  been advised about the importance in completing this exam. Pt is scheduled for diabetic foot exam on ***.           Activities of Daily Living No flowsheet data found.  Patient Care Team: [name], MD as PCP - General Debbora Dus, [facility] as Pharmacist (Pharmacist)  Indicate any recent Medical Services you may have received from other than [facility] providers in the past year (date may be approximate).     Assessment:   This is a routine wellness examination for [place].  Hearing/Vision screen No results found.  Dietary issues and exercise activities discussed:     Goals Addressed   None    Depression Screen PHQ 2/9 Scores 01/03/2021 01/05/2020 07/15/2018 04/16/2017 03/20/2016 03/17/2015  PHQ - 2 Score 0 0 0 0 0 0  PHQ- 9 Score - - 0 0 - -    Fall Risk Fall Risk  01/03/2021 01/05/2020 07/15/2018 04/16/2017 03/20/2016  Falls in the past year? 0 1 0 No No  Number falls in past yr: - 0 - - -  Injury with Fall? - 0 - - -  Risk for fall due to : - No Fall Risks - - -  Follow up - Falls evaluation completed - - -    FALL RISK PREVENTION PERTAINING TO THE HOME:  Any stairs in or around the home? {YES/NO:21197} If so, are there any without handrails? {YES/NO:21197} Home free of loose throw rugs in walkways, pet beds, electrical cords, etc? {YES/NO:21197} Adequate lighting in your home to reduce risk of falls? {YES/NO:21197}  ASSISTIVE DEVICES UTILIZED TO PREVENT FALLS:  Life alert? {YES/NO:21197} Use of a cane, walker or w/c? {YES/NO:21197} Grab bars in the bathroom? {YES/NO:21197} Shower chair or bench in shower? {YES/NO:21197} Elevated toilet seat or a handicapped toilet? {YES/NO:21197}  TIMED UP AND GO:  Was the test performed? No , visit completed over the phone.    Cognitive Function: MMSE - Mini Mental State Exam 07/15/2018 04/16/2017  Orientation to time 5 5  Orientation to Place 5 5  Registration 3 3  Attention/ Calculation 0 0  Recall 3 3   Language- name 2 objects 0 0  Language- repeat 1 1  Language- follow 3 step command 0 3  Language- read & follow direction 0 0  Write a  sentence 0 0  Copy design 0 0  Total score 17 20        Immunizations Immunization History  Administered Date(s) Administered   Fluad Quad(high Dose 65+) 01/05/2020, 01/03/2021   Influenza Split 11/13/2011   Influenza, High Dose Seasonal PF 11/23/2016   Influenza,inj,Quad PF,6+ Mos 11/18/2012, 10/06/2013, 12/23/2014, 03/20/2016, 01/28/2018   PFIZER(Purple Top)SARS-COV-2 Vaccination 03/07/2019, 04/01/2019   Pneumococcal Conjugate-13 10/06/2013   Pneumococcal Polysaccharide-23 10/11/2010    {TDAP status:2101805}  Flu Vaccine status: Up to date  Pneumococcal vaccine status: Up to date  {Covid-19 vaccine status:2101808}  Qualifies for Shingles Vaccine? Yes   Zostavax completed No   {Shingrix Completed?:2101804}  Screening Tests Health Maintenance  Topic Date Due   TETANUS/TDAP  Never done   Zoster Vaccines- Shingrix (1 of 2) Never done   DEXA SCAN  Never done   FOOT EXAM  04/24/2018   COLONOSCOPY (Pts 45-81yr Insurance coverage will need to be confirmed)  06/16/2018   OPHTHALMOLOGY EXAM  01/24/2019   COVID-19 Vaccine (3 - Pfizer risk series) 04/29/2019   HEMOGLOBIN A1C  07/04/2021   Pneumonia Vaccine 75 Years old  Completed   INFLUENZA VACCINE  Completed   Hepatitis C Screening  Completed   HPV VACCINES  Aged Out    Health Maintenance  Health Maintenance Due  Topic Date Due   TETANUS/TDAP  Never done   Zoster Vaccines- Shingrix (1 of 2) Never done   DEXA SCAN  Never done   FOOT EXAM  04/24/2018   COLONOSCOPY (Pts 45-486yrInsurance coverage will need to be confirmed)  06/16/2018   OPHTHALMOLOGY EXAM  01/24/2019   COVID-19 Vaccine (3 - Pfizer risk series) 04/29/2019    {Colorectal cancer screening:2101809}  {Mammogram status:21018020}  {Bone Density status:21018021}  Lung Cancer Screening: (Low Dose CT Chest  recommended if Age 75-80 years, 30 pack-year currently smoking OR have quit w/in 15years.) {DOES NOT does:27190::"does not"} qualify.   Lung Cancer Screening Referral: ***  Additional Screening:  Hepatitis C Screening: does qualify; Completed 03/13/16  Vision Screening: Recommended annual ophthalmology exams for early detection of glaucoma and other disorders of the eye. Is the patient up to date with their annual eye exam?  {YES/NO:21197} Who is the provider or what is the name of the office in which the patient attends annual eye exams? *** If pt is not established with a provider, would they like to be referred to a provider to establish care? {YES/NO:21197}.   Dental Screening: Recommended annual dental exams for proper oral hygiene  Community Resource Referral / Chronic Care Management: CRR required this visit?  {YES/NO:21197}  CCM required this visit?  {YES/NO:21197}     Plan:     I have personally reviewed and noted the following in the patients chart:   Medical and social history Use of alcohol, tobacco or illicit drugs  Current medications and supplements including opioid prescriptions.  Functional ability and status Nutritional status Physical activity Advanced directives List of other physicians Hospitalizations, surgeries, and ER visits in previous 12 months Vitals Screenings to include cognitive, depression, and falls Referrals and appointments  In addition, I have reviewed and discussed with patient certain preventive protocols, quality metrics, and best practice recommendations. A written personalized care plan for preventive services as well as general preventive health recommendations were provided to patient.   Due to this being a telephonic visit, the after visit summary with patients personalized plan was offered to patient via mail or my-chart. ***Patient declined at this time./ Patient would  like to access on my-chart/ per request, patient was mailed a  copy of AVS./ Patient preferred to pick up at office at next visit.   Loma Messing, LPN  78/[id number]/0766   Nurse Health Advisor  Nurse Notes: none

## 2021-01-18 ENCOUNTER — Telehealth: Payer: Self-pay

## 2021-01-18 ENCOUNTER — Ambulatory Visit: Payer: Medicare Other

## 2021-01-18 NOTE — Telephone Encounter (Signed)
Made several attempts to reach patient on home and mobile number in regards to annual wellness telephone visit today @ 8:15am. Left vm message on patient home number. Advised patient to contact office to reschedule appointment when available. TM

## 2021-01-21 ENCOUNTER — Other Ambulatory Visit: Payer: Self-pay | Admitting: Family Medicine

## 2021-01-24 ENCOUNTER — Other Ambulatory Visit: Payer: Self-pay | Admitting: Family Medicine

## 2021-01-25 ENCOUNTER — Telehealth: Payer: Self-pay

## 2021-01-25 ENCOUNTER — Ambulatory Visit (INDEPENDENT_AMBULATORY_CARE_PROVIDER_SITE_OTHER): Payer: Medicare Other

## 2021-01-25 DIAGNOSIS — I495 Sick sinus syndrome: Secondary | ICD-10-CM

## 2021-01-25 DIAGNOSIS — Z1211 Encounter for screening for malignant neoplasm of colon: Secondary | ICD-10-CM | POA: Diagnosis not present

## 2021-01-25 NOTE — Telephone Encounter (Signed)
[facility] Night - Client Nonclinical Telephone Record  [facility] Client [facility] Night - Client Client Site [place] Provider [name] - MD Contact Type Call Who Is Calling Patient / Member / Family / Caregiver Caller Name [name] Phone Number [phone] Patient Name Nina Carpenter Patient DOB Jan 16, 1946 Call Type Message Only Information Provided Reason for Call Medication Question / Request Initial Comment Caller states she would like to make sure that the office/ doctor authorizes her Rxs for the pharmacy. Additional Comment Alternate number [phone] Office hours provided Disp. Time Disposition Final User 01/24/2021 2:51:51 PM General Information Provided Yes Achilles Dunk Call Closed By: Achilles Dunk Transaction Date/Time: 01/24/2021 2:47:28 PM (ET

## 2021-01-25 NOTE — Telephone Encounter (Signed)
I spoke with pt and per med list glipizide and losartan were refilled 01/26/20; pt will ck with p harmacy. Nothing further needed at this time.

## 2021-01-26 LAB — CUP PACEART REMOTE DEVICE CHECK
Battery Remaining Longevity: 34 mo
Battery Voltage: 2.94 V
Brady Statistic AP VP Percent: 0.06 %
Brady Statistic AP VS Percent: 39.42 %
Brady Statistic AS VP Percent: 0.03 %
Brady Statistic AS VS Percent: 60.49 %
Brady Statistic RA Percent Paced: 39.31 %
Brady Statistic RV Percent Paced: 0.09 %
Date Time Interrogation Session: 20230104144154
Implantable Lead Implant Date: 20141208
Implantable Lead Implant Date: 20141208
Implantable Lead Location: 753859
Implantable Lead Location: 753860
Implantable Lead Model: 5076
Implantable Lead Model: 5076
Implantable Pulse Generator Implant Date: 20141208
Lead Channel Impedance Value: 342 Ohm
Lead Channel Impedance Value: 361 Ohm
Lead Channel Impedance Value: 361 Ohm
Lead Channel Impedance Value: 399 Ohm
Lead Channel Pacing Threshold Amplitude: 0.625 V
Lead Channel Pacing Threshold Amplitude: 0.75 V
Lead Channel Pacing Threshold Pulse Width: 0.4 ms
Lead Channel Pacing Threshold Pulse Width: 0.4 ms
Lead Channel Sensing Intrinsic Amplitude: 1.75 mV
Lead Channel Sensing Intrinsic Amplitude: 1.75 mV
Lead Channel Sensing Intrinsic Amplitude: 7.875 mV
Lead Channel Sensing Intrinsic Amplitude: 7.875 mV
Lead Channel Setting Pacing Amplitude: 2 V
Lead Channel Setting Pacing Amplitude: 2.5 V
Lead Channel Setting Pacing Pulse Width: 0.4 ms
Lead Channel Setting Sensing Sensitivity: 2 mV

## 2021-02-02 LAB — COLOGUARD: COLOGUARD: NEGATIVE

## 2021-02-04 NOTE — Progress Notes (Signed)
Remote pacemaker transmission.   

## 2021-02-07 NOTE — Progress Notes (Signed)
Subjective:   Nina Carpenter is a 76 y.o. female who presents for Medicare Annual (Subsequent) preventive examination.  I connected with Nina Carpenter today by telephone and verified that I am speaking with the correct person using two identifiers. Location patient: home Location provider: work Persons participating in the virtual visit: patient, Marine scientist.    I discussed the limitations, risks, security and privacy concerns of performing an evaluation and management service by telephone and the availability of in person appointments. I also discussed with the patient that there may be a patient responsible charge related to this service. The patient expressed understanding and verbally consented to this telephonic visit.    Interactive audio and video telecommunications were attempted between this provider and patient, however failed, due to patient having technical difficulties OR patient did not have access to video capability.  We continued and completed visit with audio only.  Some vital signs may be absent or patient reported.   Time Spent with patient on telephone encounter: 25 minutes  Review of Systems     Cardiac Risk Factors include: advanced age (>30mn, >>30women);diabetes mellitus;hypertension;dyslipidemia     Objective:    Today's Vitals   02/08/21 0946  Weight: 204 lb (92.5 kg)  Height: 5' 1"  (1.549 m)   Body mass index is 38.55 kg/m.  Advanced Directives 02/08/2021 07/15/2018 04/16/2017 02/22/2013 01/28/2013 01/11/2013 01/03/2013  Does Patient Have a Medical Advance Directive? Yes No No Patient does not have advance directive;Patient would not like information Patient does not have advance directive Patient does not have advance directive Patient does not have advance directive  Type of Advance Directive [place] will - - - - - -  Does patient want to make changes to medical advance directive? Yes (MAU/Ambulatory/Procedural Areas -  Information given) - - - - - -  Would patient like information on creating a medical advance directive? - No - Patient declined Yes (MAU/Ambulatory/Procedural Areas - Information given) - - - -  Pre-existing out of facility DNR order (yellow form or pink MOST form) - - - No No No No    Current Medications (verified) Outpatient Encounter Medications as of 02/08/2021  Medication Sig   Accu-Chek FastClix Lancets MISC USE TO CHECK BLOOD SUGAR ONCE EVERY 2 WEEKS   apixaban (ELIQUIS) 5 MG TABS tablet TAKE 1 TABLET BY MOUTH TWICE A DAY   Blood Glucose Calibration (ACCU-CHEK GUIDE CONTROL) LIQD 1 each by In Vitro route as directed.   Blood Glucose Monitoring Suppl (ACCU-CHEK GUIDE ME) w/Device KIT 1 each by Does not apply route every 14 (fourteen) days. Use to check FBS and 2 HR PP once every 2 weeks   furosemide (LASIX) 20 MG tablet Take 1 tablet (20 mg) by mouth once every other day   glipiZIDE (GLUCOTROL XL) 10 MG 24 hr tablet Take 1 tablet (10 mg total) by mouth daily with breakfast.   glucose blood (ACCU-CHEK GUIDE) test strip Use as instructed   losartan (COZAAR) 100 MG tablet TAKE 1 TABLET(100 MG) BY MOUTH DAILY   metFORMIN (GLUCOPHAGE-XR) 500 MG 24 hr tablet Take 3 tablets (1,500 mg total) by mouth daily with breakfast.   metoprolol tartrate (LOPRESSOR) 100 MG tablet TAKE 1 TABLET(100 MG) BY MOUTH TWICE DAILY   mometasone (NASONEX) 50 MCG/ACT nasal spray Place 2 sprays into the nose daily as needed.   montelukast (SINGULAIR) 10 MG tablet Take 10 mg by mouth at bedtime as needed.   pioglitazone (ACTOS) 30 MG tablet TAKE  1 TABLET(30 MG) BY MOUTH DAILY   rosuvastatin (CRESTOR) 5 MG tablet Take 1 tablet (5 mg total) by mouth daily.   triamcinolone cream (KENALOG) 0.1 % Apply 1 application topically 2 (two) times daily.   No facility-administered encounter medications on file as of 02/08/2021.    Allergies (verified) Bee venom, Fish allergy, Penicillins, Amiodarone, Iodine, Ace inhibitors,  Januvia [sitagliptin], Lidocaine hcl, and Lipitor [atorvastatin]   History: Past Medical History:  Diagnosis Date   Acid reflux disease    Asthma    a. Remotely.   Chronic combined systolic and diastolic CHF (congestive heart failure) ([place])    a. Felt related to AF. EF 45-50% by echo 12/2012.   CVA (cerebral vascular accident) ([place])    a. Thromboemoblic L MCA CVA 40/3474 with no residual deficit except mild occasional word finding. b. 2-59% RICA/LICA by dopplers 56/3/87.   GERD (gastroesophageal reflux disease)    H/O medication noncompliance    a. Per PCP note 01/06/13: Previously refused anything greater than aspirin, refused treatment for DM, and often stopped hypertensive meds and lipid meds.   Hx of rheumatic fever    a. As a child.   Hyperlipidemia    Hypertension    Kidney stones    a. Remotely.   PAF (paroxysmal atrial fibrillation) ([place])    a. Dx 09/2012, chronicity unclear at that time. Initially refused Xarelto due to risk of bleeding. b. Sustained CVA 12/2012 - agreeable to taking Eliquis. Tachybrady syndrome during that admission s/p Medtronic PPM implantation;  c. Failed flecainide, sotalol, amiodarone (itching), and propafenone.   Tachy-brady syndrome ([place])    a. AF RVR with pauses >5sec requiring Medtronic PPM 12/2012.   Tubular adenoma of colon 05/2008   Type 2 diabetes mellitus ([place])    Past Surgical History:  Procedure Laterality Date   CHOLECYSTECTOMY     DESTRUCTION TRIGEMINAL NERVE VIA NEUROLYTIC AGENT  NOv @013    Gamma Knife    ENDOMETRIAL ABLATION     INSERT / REPLACE / REMOVE PACEMAKER     PACEMAKER INSERTION  12-30-12   MDT dual chamber pacemaker implanted by Dr Caryl Comes for tachy-brady syndrome   PERMANENT PACEMAKER INSERTION N/A 12/30/2012   Procedure: PERMANENT PACEMAKER INSERTION;  Surgeon: Deboraha Sprang, MD;  Location: [facility] CATH [facility];  Service: Cardiovascular;  Laterality: N/A;   TUBAL LIGATION     ectopic preganacy    Family History  Problem Relation  Age of Onset   Stroke Mother    Diabetes Mother    Hypertension Mother    Social History   Socioeconomic History   Marital status: Married    Spouse name: Not on file   Number of children: 2   Years of education: Not on file   Highest education level: Not on file  Occupational History   Occupation: cosmetologist    Employer: SELF EMPLOYED  Tobacco Use   Smoking status: Former    Packs/day: 2.00    Types: Cigarettes    Quit date: 01/24/1968    Years since quitting: 53.0   Smokeless tobacco: Never  Vaping Use   Vaping Use: Never used  Substance and Sexual Activity   Alcohol use: Yes    Alcohol/week: 1.0 standard drink    Types: 1 Glasses of wine per week    Comment: rare use   Drug use: No   Sexual activity: Yes    Birth control/protection: Post-menopausal  Other Topics Concern   Not on file  Social History Narrative  Lives in [place] with her husband.  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   Social Determinants of Health   Financial Resource Strain: Low Risk    Difficulty of Paying Living Expenses: Not hard at all  Food Insecurity: No Food Insecurity   Worried About Charity fundraiser in the Last Year: Never true   [place] in the Last Year: Never true  Transportation Needs: No Transportation Needs   Lack of Transportation (Medical): No   Lack of Transportation (Non-Medical): No  Physical Activity: Inactive   Days of Exercise per Week: 0 days   Minutes of Exercise per Session: 0 min  Stress: No Stress Concern Present   Feeling of Stress : Not at all  Social Connections: Moderately Isolated   Frequency of Communication with Friends and Family: More than three times a week   Frequency of Social Gatherings with Friends and Family: More than three times a week   Attends Religious Services: Never   Marine scientist or Organizations: No   Attends Music therapist: Never   Marital Status: Married    Tobacco Counseling Counseling  given: Not Answered   Clinical Intake:  Pre-visit preparation completed: Yes  Pain : No/denies pain     BMI - recorded: 38.55 Nutritional Status: BMI > 30  Obese Nutritional Risks: None Diabetes: Yes CBG done?: No Did pt. bring in CBG monitor from home?: No  How often do you need to have someone help you when you read instructions, pamphlets, or other written materials from your doctor or pharmacy?: 1 - Never  Diabetes:  Is the patient diabetic?  Yes  If diabetic, was a CBG obtained today?  No  Did the patient bring in their glucometer from home?  No  How often do you monitor your CBG's? 1x daily.   Financial Strains and Diabetes Management:  Are you having any financial strains with the device, your supplies or your medication? No .  Does the patient want to be seen by Chronic Care Management for management of their diabetes?  No  Would the patient like to be referred to a Nutritionist or for Diabetic Management?  No   Diabetic Exams:  Diabetic Eye Exam: Completed 12/2020.   Diabetic Foot Exam: Pt is due , last completed 04/23/17.has been advised about the importance in completing this exam.   Interpreter Needed?: No  Information entered by :: Orrin Brigham LPN   Activities of Daily Living In your present state of health, do you have any difficulty performing the following activities: 02/08/2021  Hearing? N  Vision? Y  Difficulty concentrating or making decisions? N  Walking or climbing stairs? N  Dressing or bathing? N  Doing errands, shopping? N  Preparing Food and eating ? N  Using the Toilet? N  In the past six months, have you accidently leaked urine? N  Managing your Medications? N  Managing your Finances? N  Housekeeping or managing your Housekeeping? N  Some recent data might be hidden    Patient Care Team: [name], MD as PCP - General Debbora Dus, [facility] as Pharmacist (Pharmacist)  Indicate any recent Medical Services you may have  received from other than [facility] providers in the past year (date may be approximate).     Assessment:   This is a routine wellness examination for [place].  Hearing/Vision screen Hearing Screening - Comments:: No issues Vision Screening - Comments:: Last exam 2022, [facility], wears glasses  Dietary issues and exercise activities discussed: Current Exercise Habits: The patient does not participate in regular exercise at present   Goals Addressed             This Visit's Progress    Patient Stated       Would like to eat healthier       Depression Screen PHQ 2/9 Scores 02/08/2021 01/03/2021 01/05/2020 07/15/2018 04/16/2017 03/20/2016 03/17/2015  PHQ - 2 Score 0 0 0 0 0 0 0  PHQ- 9 Score - - - 0 0 - -    Fall Risk Fall Risk  02/08/2021 01/03/2021 01/05/2020 07/15/2018 04/16/2017  Falls in the past year? 0 0 1 0 No  Number falls in past yr: 0 - 0 - -  Injury with Fall? 0 - 0 - -  Risk for fall due to : No Fall Risks - No Fall Risks - -  Follow up Falls prevention discussed - Falls evaluation completed - -    FALL RISK PREVENTION PERTAINING TO THE HOME:  Any stairs in or around the home? Yes  If so, are there any without handrails? No  Home free of loose throw rugs in walkways, pet beds, electrical cords, etc? Yes  Adequate lighting in your home to reduce risk of falls? Yes   ASSISTIVE DEVICES UTILIZED TO PREVENT FALLS:  Life alert? No  Use of a cane, walker or w/c? No  Grab bars in the bathroom? Yes  Shower chair or bench in shower? No  Elevated toilet seat or a handicapped toilet? Yes   TIMED UP AND GO:  Was the test performed? No .    Cognitive Function: Normal cognitive status assessed by d this Nurse Health Advisor. No abnormalities found.   MMSE - Mini Mental State Exam 07/15/2018 04/16/2017  Orientation to time 5 5  Orientation to Place 5 5  Registration 3 3  Attention/ Calculation 0 0  Recall 3 3  Language- name 2 objects 0 0  Language- repeat 1 1   Language- follow 3 step command 0 3  Language- read & follow direction 0 0  Write a sentence 0 0  Copy design 0 0  Total score 17 20        Immunizations Immunization History  Administered Date(s) Administered   Fluad Quad(high Dose 65+) 01/05/2020, 01/03/2021   Influenza Split 11/13/2011   Influenza, High Dose Seasonal PF 11/23/2016   Influenza,inj,Quad PF,6+ Mos 11/18/2012, 10/06/2013, 12/23/2014, 03/20/2016, 01/28/2018   PFIZER(Purple Top)SARS-COV-2 Vaccination 03/07/2019, 04/01/2019   Pneumococcal Conjugate-13 10/06/2013   Pneumococcal Polysaccharide-23 10/11/2010    TDAP status: Due, Education has been provided regarding the importance of this vaccine. Advised may receive this vaccine at local pharmacy or Health Dept. Aware to provide a copy of the vaccination record if obtained from local pharmacy or Health Dept. Verbalized acceptance and understanding.  Flu Vaccine status: Up to date  Pneumococcal vaccine status: Up to date  Covid-19 vaccine status: Declined, Education has been provided regarding the importance of this vaccine but patient still declined. Advised may receive this vaccine at local pharmacy or Health Dept.or vaccine clinic. Aware to provide a copy of the vaccination record if obtained from local pharmacy or Health Dept. Verbalized acceptance and understanding.  Qualifies for Shingles Vaccine? Yes   Zostavax completed No   Shingrix Completed?: No.    Education has been provided regarding the importance of this vaccine. Patient has been advised to call insurance company to determine out of pocket expense if they  have not yet received this vaccine. Advised may also receive vaccine at local pharmacy or Health Dept. Verbalized acceptance and understanding.  Screening Tests Health Maintenance  Topic Date Due   TETANUS/TDAP  Never done   Zoster Vaccines- Shingrix (1 of 2) Never done   DEXA SCAN  Never done   FOOT EXAM  04/24/2018   OPHTHALMOLOGY EXAM   01/24/2019   COVID-19 Vaccine (3 - Pfizer risk series) 04/29/2019   HEMOGLOBIN A1C  07/04/2021   Fecal DNA (Cologuard)  01/26/2024   Pneumonia Vaccine 76+ Years old  Completed   INFLUENZA VACCINE  Completed   Hepatitis C Screening  Completed   HPV VACCINES  Aged Out   COLONOSCOPY (Pts 45-60yr Insurance coverage will need to be confirmed)  Discontinued    Health Maintenance  Health Maintenance Due  Topic Date Due   TETANUS/TDAP  Never done   Zoster Vaccines- Shingrix (1 of 2) Never done   DEXA SCAN  Never done   FOOT EXAM  04/24/2018   OPHTHALMOLOGY EXAM  01/24/2019   COVID-19 Vaccine (3 - Pfizer risk series) 04/29/2019    Colorectal cancer screening: Type of screening: Cologuard. Completed 01/25/21. Repeat every 3 years  Mammogram status: Patient declined today will discuss with PCP when decided  Bone Density status: Patient declined today will discuss with PCP when decided  Lung Cancer Screening: (Low Dose CT Chest recommended if Age 76-80years, 30 pack-year currently smoking OR have quit w/in 15years.) does not qualify.      Additional Screening:  Hepatitis C Screening: does qualify; Completed 03/13/16  Vision Screening: Recommended annual ophthalmology exams for early detection of glaucoma and other disorders of the eye. Is the patient up to date with their annual eye exam?  Yes  Who is the provider or what is the name of the office in which the patient attends annual eye exams? [facility], provider information unavailable   Dental Screening: Recommended annual dental exams for proper oral hygiene  Community Resource Referral / Chronic Care Management: CRR required this visit?  No   CCM required this visit?  No      Plan:     I have personally reviewed and noted the following in the patients chart:   Medical and social history Use of alcohol, tobacco or illicit drugs  Current medications and supplements including opioid prescriptions.  Functional  ability and status Nutritional status Physical activity Advanced directives List of other physicians Hospitalizations, surgeries, and ER visits in previous 12 months Vitals Screenings to include cognitive, depression, and falls Referrals and appointments  In addition, I have reviewed and discussed with patient certain preventive protocols, quality metrics, and best practice recommendations. A written personalized care plan for preventive services as well as general preventive health recommendations were provided to patient.    Due to this being a telephonic visit, the after visit summary with patients personalized plan was offered to patient via mail or my-chart. Patient would like to access on my-chart.   TLoma Messing LPN   01/03/2481  Nurse Health Advisor  Nurse Notes: none

## 2021-02-08 ENCOUNTER — Ambulatory Visit (INDEPENDENT_AMBULATORY_CARE_PROVIDER_SITE_OTHER): Payer: Medicare Other

## 2021-02-08 VITALS — Ht 61.0 in | Wt 204.0 lb

## 2021-02-08 DIAGNOSIS — Z0000 Encounter for general adult medical examination without abnormal findings: Secondary | ICD-10-CM | POA: Diagnosis not present

## 2021-02-08 NOTE — Patient Instructions (Signed)
Nina Carpenter , Thank you for taking time to complete your Medicare Wellness Visit. I appreciate your ongoing commitment to your health goals. Please review the following plan we discussed and let me know if I can assist you in the future.   Screening recommendations/referrals: Colonoscopy: cologuard completed 01/25/21, due 01/26/24 Mammogram: due, last completed 06/10/15, declined today, discuss with PCP if you change your mind. Bone Density: due, declined today, discuss with PCP if you change your mind. Recommended yearly ophthalmology/optometry visit for glaucoma screening and checkup Recommended yearly dental visit for hygiene and checkup  Vaccinations: Influenza vaccine: up to date Pneumococcal vaccine: up to date Tdap vaccine: Due-May obtain vaccine at your local pharmacy. Shingles vaccine: May obtain vaccine at your local pharmacy.   Covid-19:newest booster available at your local pharmacy  Advanced directives: Please bring a copy of Living Will and/or [place] for your chart.   Conditions/risks identified: see problem list  Next appointment: Follow up in one year for your annual wellness visit 02/09/22 @ 9:45am, this will be a telephone visit   Preventive Care 65 Years and Older, Female Preventive care refers to lifestyle choices and visits with your health care provider that can promote health and wellness. What does preventive care include? A yearly physical exam. This is also called an annual well check. Dental exams once or twice a year. Routine eye exams. Ask your health care provider how often you should have your eyes checked. Personal lifestyle choices, including: Daily care of your teeth and gums. Regular physical activity. Eating a healthy diet. Avoiding tobacco and drug use. Limiting alcohol use. Practicing safe sex. Taking low-dose aspirin every day. Taking vitamin and mineral supplements as recommended by your health care provider. What happens  during an annual well check? The services and screenings done by your health care provider during your annual well check will depend on your age, overall health, lifestyle risk factors, and family history of disease. Counseling  Your health care provider may ask you questions about your: Alcohol use. Tobacco use. Drug use. Emotional well-being. Home and relationship well-being. Sexual activity. Eating habits. History of falls. Memory and ability to understand (cognition). Work and work Statistician. Reproductive health. Screening  You may have the following tests or measurements: Height, weight, and BMI. Blood pressure. Lipid and cholesterol levels. These may be checked every 5 years, or more frequently if you are over 81 years old. Skin check. Lung cancer screening. You may have this screening every year starting at age 39 if you have a 30-pack-year history of smoking and currently smoke or have quit within the past 15 years. Fecal occult blood test (FOBT) of the stool. You may have this test every year starting at age 65. Flexible sigmoidoscopy or colonoscopy. You may have a sigmoidoscopy every 5 years or a colonoscopy every 10 years starting at age 106. Hepatitis C blood test. Hepatitis B blood test. Sexually transmitted disease (STD) testing. Diabetes screening. This is done by checking your blood sugar (glucose) after you have not eaten for a while (fasting). You may have this done every 1-3 years. Bone density scan. This is done to screen for osteoporosis. You may have this done starting at age 46. Mammogram. This may be done every 1-2 years. Talk to your health care provider about how often you should have regular mammograms. Talk with your health care provider about your test results, treatment options, and if necessary, the need for more tests. Vaccines  Your health care provider may  recommend certain vaccines, such as: Influenza vaccine. This is recommended every  year. Tetanus, diphtheria, and acellular pertussis (Tdap, Td) vaccine. You may need a Td booster every 10 years. Zoster vaccine. You may need this after age 92. Pneumococcal 13-valent conjugate (PCV13) vaccine. One dose is recommended after age 45. Pneumococcal polysaccharide (PPSV23) vaccine. One dose is recommended after age 73. Talk to your health care provider about which screenings and vaccines you need and how often you need them. This information is not intended to replace advice given to you by your health care provider. Make sure you discuss any questions you have with your health care provider. Document Released: 02/05/2015 Document Revised: 09/29/2015 Document Reviewed: 11/10/2014 Elsevier Interactive Patient Education  2017 [place] Prevention in the Home Falls can cause injuries. They can happen to people of all ages. There are many things you can do to make your home safe and to help prevent falls. What can I do on the outside of my home? Regularly fix the edges of walkways and driveways and fix any cracks. Remove anything that might make you trip as you walk through a door, such as a raised step or threshold. Trim any bushes or trees on the path to your home. Use bright outdoor lighting. Clear any walking paths of anything that might make someone trip, such as rocks or tools. Regularly check to see if handrails are loose or broken. Make sure that both sides of any steps have handrails. Any raised decks and porches should have guardrails on the edges. Have any leaves, snow, or ice cleared regularly. Use sand or salt on walking paths during winter. Clean up any spills in your garage right away. This includes oil or grease spills. What can I do in the bathroom? Use night lights. Install grab bars by the toilet and in the tub and shower. Do not use towel bars as grab bars. Use non-skid mats or decals in the tub or shower. If you need to sit down in the shower, use a  plastic, non-slip stool. Keep the floor dry. Clean up any water that spills on the floor as soon as it happens. Remove soap buildup in the tub or shower regularly. Attach bath mats securely with double-sided non-slip rug tape. Do not have throw rugs and other things on the floor that can make you trip. What can I do in the bedroom? Use night lights. Make sure that you have a light by your bed that is easy to reach. Do not use any sheets or blankets that are too big for your bed. They should not hang down onto the floor. Have a firm chair that has side arms. You can use this for support while you get dressed. Do not have throw rugs and other things on the floor that can make you trip. What can I do in the kitchen? Clean up any spills right away. Avoid walking on wet floors. Keep items that you use a lot in easy-to-reach places. If you need to reach something above you, use a strong step stool that has a grab bar. Keep electrical cords out of the way. Do not use floor polish or wax that makes floors slippery. If you must use wax, use non-skid floor wax. Do not have throw rugs and other things on the floor that can make you trip. What can I do with my stairs? Do not leave any items on the stairs. Make sure that there are handrails on both sides of  the stairs and use them. Fix handrails that are broken or loose. Make sure that handrails are as long as the stairways. Check any carpeting to make sure that it is firmly attached to the stairs. Fix any carpet that is loose or worn. Avoid having throw rugs at the top or bottom of the stairs. If you do have throw rugs, attach them to the floor with carpet tape. Make sure that you have a light switch at the top of the stairs and the bottom of the stairs. If you do not have them, ask someone to add them for you. What else can I do to help prevent falls? Wear shoes that: Do not have high heels. Have rubber bottoms. Are comfortable and fit you  well. Are closed at the toe. Do not wear sandals. If you use a stepladder: Make sure that it is fully opened. Do not climb a closed stepladder. Make sure that both sides of the stepladder are locked into place. Ask someone to hold it for you, if possible. Clearly mark and make sure that you can see: Any grab bars or handrails. First and last steps. Where the edge of each step is. Use tools that help you move around (mobility aids) if they are needed. These include: Canes. Walkers. Scooters. Crutches. Turn on the lights when you go into a dark area. Replace any light bulbs as soon as they burn out. Set up your furniture so you have a clear path. Avoid moving your furniture around. If any of your floors are uneven, fix them. If there are any pets around you, be aware of where they are. Review your medicines with your doctor. Some medicines can make you feel dizzy. This can increase your chance of falling. Ask your doctor what other things that you can do to help prevent falls. This information is not intended to replace advice given to you by your health care provider. Make sure you discuss any questions you have with your health care provider. Document Released: 11/05/2008 Document Revised: 06/17/2015 Document Reviewed: 02/13/2014 Elsevier Interactive Patient Education  2017 [employer].

## 2021-02-18 ENCOUNTER — Other Ambulatory Visit: Payer: Self-pay | Admitting: Family Medicine

## 2021-02-18 ENCOUNTER — Telehealth (INDEPENDENT_AMBULATORY_CARE_PROVIDER_SITE_OTHER): Payer: Medicare Other | Admitting: Family Medicine

## 2021-02-18 ENCOUNTER — Other Ambulatory Visit: Payer: Self-pay

## 2021-02-18 ENCOUNTER — Encounter: Payer: Self-pay | Admitting: Family Medicine

## 2021-02-18 VITALS — Wt 204.0 lb

## 2021-02-18 DIAGNOSIS — Z20822 Contact with and (suspected) exposure to covid-19: Secondary | ICD-10-CM | POA: Diagnosis not present

## 2021-02-18 DIAGNOSIS — J4521 Mild intermittent asthma with (acute) exacerbation: Secondary | ICD-10-CM | POA: Insufficient documentation

## 2021-02-18 MED ORDER — MONTELUKAST SODIUM 10 MG PO TABS: 10.0000 mg | ORAL_TABLET | Freq: Every evening | ORAL | 3 refills | Status: AC | PRN

## 2021-02-18 MED ORDER — PREDNISONE 20 MG PO TABS: ORAL_TABLET | ORAL | 0 refills | Status: DC

## 2021-02-18 MED ORDER — MOMETASONE FUROATE 50 MCG/ACT NA SUSP: 2.0000 | Freq: Every day | NASAL | 1 refills | Status: DC | PRN

## 2021-02-18 MED ORDER — GUAIFENESIN-CODEINE 100-10 MG/5ML PO SYRP
5.0000 mL | ORAL_SOLUTION | Freq: Every evening | ORAL | 0 refills | Status: DC | PRN
Start: 2021-02-18 — End: 2021-04-28

## 2021-02-18 MED ORDER — ALBUTEROL SULFATE HFA 108 (90 BASE) MCG/ACT IN AERS: 2.0000 | INHALATION_SPRAY | Freq: Four times a day (QID) | RESPIRATORY_TRACT | 2 refills | Status: DC | PRN

## 2021-02-18 NOTE — Progress Notes (Signed)
VIRTUAL VISIT Due to national recommendations of social distancing due to [place] 19, a virtual visit is felt to be most appropriate for this patient at this time.   I connected with the patient on 02/18/21 at 11:40 AM EST by virtual telehealth platform and verified that I am speaking with the correct person using two identifiers.   I discussed the limitations, risks, security and privacy concerns of performing an evaluation and management service by  virtual telehealth platform and the availability of in person appointments. I also discussed with the patient that there may be a patient responsible charge related to this service. The patient expressed understanding and agreed to proceed.  Patient location: Home Provider Location: [place] Participants: Eliezer Lofts and Nobie Putnam   Chief Complaint  Patient presents with   Nasal Congestion   Headache   Cough     x 1 week   Sinusitis    And sneezing    History of Present Illness:  76 year old female patient of Dr. Lorelei Pont with history of asthma, allergies, afib and diabetes presents with new onset congestion, headache and cough.  Date of onset: 1/256/2023   She reports she started with head congestion,. Headache, post nasal drip, sneeze. Now it has progressed to chest, productive.  Some ST, no ear pain, no sinus pressure.  No fever, no SOB,  intermittent wheeze.. better with mucinex.  No body aches. Chest soreness with coughing   She is out of Singulair and  nasonex.  COVID 19 screen COVID testing: none COVID vaccine: 04/01/2019 , 03/07/2019 COVID exposure: No recent travel or known exposure to [place].. did visit friend in hospital  The importance of social distancing was discussed today.    Review of Systems  Constitutional:  Negative for chills and fever.  HENT:  Positive for congestion and sore throat. Negative for ear pain.   Eyes:  Negative for pain and redness.  Respiratory:  Positive for cough and  wheezing. Negative for shortness of breath.   Cardiovascular:  Negative for chest pain, palpitations and leg swelling.  Gastrointestinal:  Negative for abdominal pain, blood in stool, constipation, diarrhea, nausea and vomiting.  Genitourinary:  Negative for dysuria.  Musculoskeletal:  Negative for falls and myalgias.  Skin:  Negative for rash.  Neurological:  Negative for dizziness.  Psychiatric/Behavioral:  Negative for depression. The patient is not nervous/anxious.      Past Medical History:  Diagnosis Date   Acid reflux disease    Asthma    a. Remotely.   Chronic combined systolic and diastolic CHF (congestive heart failure) ([place])    a. Felt related to AF. EF 45-50% by echo 12/2012.   CVA (cerebral vascular accident) ([place])    a. Thromboemoblic L MCA CVA 78/9381 with no residual deficit except mild occasional word finding. b. 0-17% RICA/LICA by dopplers 51/0/25.   GERD (gastroesophageal reflux disease)    H/O medication noncompliance    a. Per PCP note 01/06/13: Previously refused anything greater than aspirin, refused treatment for DM, and often stopped hypertensive meds and lipid meds.   Hx of rheumatic fever    a. As a child.   Hyperlipidemia    Hypertension    Kidney stones    a. Remotely.   PAF (paroxysmal atrial fibrillation) ([place])    a. Dx 09/2012, chronicity unclear at that time. Initially refused Xarelto due to risk of bleeding. b. Sustained CVA 12/2012 - agreeable to taking Eliquis. Tachybrady syndrome during that admission s/p Medtronic  PPM implantation;  c. Failed flecainide, sotalol, amiodarone (itching), and propafenone.   Tachy-brady syndrome ([place])    a. AF RVR with pauses >5sec requiring Medtronic PPM 12/2012.   Tubular adenoma of colon 05/2008   Type 2 diabetes mellitus ([place])     reports that she quit smoking about 53 years ago. Her smoking use included cigarettes. She smoked an average of 2 packs per day. She has never used smokeless tobacco. She reports current  alcohol use of about 1.0 standard drink per week. She reports that she does not use drugs.   Current Outpatient Medications:    Accu-Chek FastClix Lancets MISC, USE TO CHECK BLOOD SUGAR ONCE EVERY 2 WEEKS, Disp: 102 each, Rfl: 0   apixaban (ELIQUIS) 5 MG TABS tablet, TAKE 1 TABLET BY MOUTH TWICE A DAY, Disp: 60 tablet, Rfl: 5   Blood Glucose Calibration (ACCU-CHEK GUIDE CONTROL) LIQD, 1 each by In Vitro route as directed., Disp: 1 each, Rfl: 0   Blood Glucose Monitoring Suppl (ACCU-CHEK GUIDE ME) w/Device KIT, 1 each by Does not apply route every 14 (fourteen) days. Use to check FBS and 2 HR PP once every 2 weeks, Disp: 1 kit, Rfl: 0   furosemide (LASIX) 20 MG tablet, Take 1 tablet (20 mg) by mouth once every other day, Disp: 45 tablet, Rfl: 3   glipiZIDE (GLUCOTROL XL) 10 MG 24 hr tablet, Take 1 tablet (10 mg total) by mouth daily with breakfast., Disp: 90 tablet, Rfl: 1   glucose blood (ACCU-CHEK GUIDE) test strip, Use as instructed, Disp: 50 strip, Rfl: 3   hydrochlorothiazide (HYDRODIURIL) 25 MG tablet, Take 25 mg by mouth daily., Disp: , Rfl:    losartan (COZAAR) 100 MG tablet, TAKE 1 TABLET(100 MG) BY MOUTH DAILY, Disp: 90 tablet, Rfl: 3   metFORMIN (GLUCOPHAGE-XR) 500 MG 24 hr tablet, Take 3 tablets (1,500 mg total) by mouth daily with breakfast., Disp: 90 tablet, Rfl: 5   metoprolol tartrate (LOPRESSOR) 100 MG tablet, TAKE 1 TABLET(100 MG) BY MOUTH TWICE DAILY, Disp: 180 tablet, Rfl: 1   mometasone (NASONEX) 50 MCG/ACT nasal spray, Place 2 sprays into the nose daily as needed., Disp: , Rfl:    montelukast (SINGULAIR) 10 MG tablet, Take 10 mg by mouth at bedtime as needed., Disp: , Rfl:    pioglitazone (ACTOS) 30 MG tablet, TAKE 1 TABLET(30 MG) BY MOUTH DAILY, Disp: 90 tablet, Rfl: 1   rosuvastatin (CRESTOR) 5 MG tablet, Take 1 tablet (5 mg total) by mouth daily., Disp: 90 tablet, Rfl: 3   triamcinolone cream (KENALOG) 0.1 %, Apply 1 application topically 2 (two) times daily., Disp: 454 g,  Rfl: 0   Observations/Objective: Weight 204 lb (92.5 kg).  Physical Exam  Physical Exam Constitutional:      General: The patient is not in acute distress. Pulmonary:     Effort: Pulmonary effort is normal. No respiratory distress.  Neurological:     Mental Status: The patient is alert and oriented to person, place, and time.  Psychiatric:        Mood and Affect: Mood normal.        Behavior: Behavior normal.   Assessment and Plan Problem List Items Addressed This Visit     Mild intermittent asthma with exacerbation - Primary    Likely viral trigger. Test at home for COVID.  Restart Singulair and nasonex. Use albuterol as needed and  Cough suppressant at night.  If  Wheeze not improving as expected... fill rx for prednisone but watch  CBGs closely.      Relevant Medications   montelukast (SINGULAIR) 10 MG tablet   predniSONE (DELTASONE) 20 MG tablet   albuterol (VENTOLIN HFA) 108 (90 Base) MCG/ACT inhaler   Meds ordered this encounter  Medications   mometasone (NASONEX) 50 MCG/ACT nasal spray    Sig: Place 2 sprays into the nose daily as needed.    Dispense:  1 each    Refill:  1   montelukast (SINGULAIR) 10 MG tablet    Sig: Take 1 tablet (10 mg total) by mouth at bedtime as needed.    Dispense:  90 tablet    Refill:  3   predniSONE (DELTASONE) 20 MG tablet    Sig: 3 tabs by mouth daily x 3 days, then 2 tabs by mouth daily x 2 days then 1 tab by mouth daily x 2 days    Dispense:  15 tablet    Refill:  0   albuterol (VENTOLIN HFA) 108 (90 Base) MCG/ACT inhaler    Sig: Inhale 2 puffs into the lungs every 6 (six) hours as needed for wheezing or shortness of breath.    Dispense:  8 g    Refill:  2   guaiFENesin-codeine (ROBITUSSIN AC) 100-10 MG/5ML syrup    Sig: Take 5-10 mLs by mouth at bedtime as needed for cough.    Dispense:  150 mL    Refill:  0      I discussed the assessment and treatment plan with the patient. The patient was provided an opportunity to ask  questions and all were answered. The patient agreed with the plan and demonstrated an understanding of the instructions.   The patient was advised to call back or seek an in-person evaluation if the symptoms worsen or if the condition fails to improve as anticipated.     Eliezer Lofts, MD

## 2021-02-18 NOTE — Assessment & Plan Note (Signed)
Likely viral trigger. Test at home for COVID.  Restart Singulair and nasonex. Use albuterol as needed and  Cough suppressant at night.  If  Wheeze not improving as expected... fill rx for prednisone but watch CBGs closely.

## 2021-02-27 ENCOUNTER — Other Ambulatory Visit: Payer: Self-pay | Admitting: Family Medicine

## 2021-02-27 DIAGNOSIS — E1159 Type 2 diabetes mellitus with other circulatory complications: Secondary | ICD-10-CM

## 2021-03-15 ENCOUNTER — Other Ambulatory Visit: Payer: Self-pay | Admitting: Family Medicine

## 2021-03-20 ENCOUNTER — Encounter: Payer: Self-pay | Admitting: Internal Medicine

## 2021-03-29 DIAGNOSIS — J45901 Unspecified asthma with (acute) exacerbation: Secondary | ICD-10-CM | POA: Diagnosis not present

## 2021-03-29 DIAGNOSIS — R062 Wheezing: Secondary | ICD-10-CM | POA: Diagnosis not present

## 2021-03-30 ENCOUNTER — Telehealth: Payer: Self-pay

## 2021-03-30 ENCOUNTER — Other Ambulatory Visit: Payer: Self-pay | Admitting: Family Medicine

## 2021-03-30 NOTE — Progress Notes (Signed)
? ? ?  Chronic Care Management ?Pharmacy Assistant  ? ?Name: Nina Carpenter  MRN: [id number] DOB: 19-Feb-1945 ? ?Reason for Encounter: CCM Counsellor) ?  ?Medications: ?Outpatient Encounter Medications as of 03/30/2021  ?Medication Sig  ? Accu-Chek FastClix Lancets MISC USE TO CHECK BLOOD SUGAR ONCE EVERY 2 WEEKS  ? albuterol (VENTOLIN HFA) 108 (90 Base) MCG/ACT inhaler Inhale 2 puffs into the lungs every 6 (six) hours as needed for wheezing or shortness of breath.  ? apixaban (ELIQUIS) 5 MG TABS tablet TAKE 1 TABLET BY MOUTH TWICE A DAY  ? Blood Glucose Calibration (ACCU-CHEK GUIDE CONTROL) LIQD 1 each by In Vitro route as directed.  ? Blood Glucose Monitoring Suppl (ACCU-CHEK GUIDE ME) w/Device KIT 1 each by Does not apply route every 14 (fourteen) days. Use to check FBS and 2 HR PP once every 2 weeks  ? furosemide (LASIX) 20 MG tablet Take 1 tablet (20 mg) by mouth once every other day  ? glipiZIDE (GLUCOTROL XL) 10 MG 24 hr tablet Take 1 tablet (10 mg total) by mouth daily with breakfast.  ? glucose blood (ACCU-CHEK GUIDE) test strip USE TO CHECK FASTING GLUCOSE AND 2 HOURS AFTER MEALS ONCE EVERY 2 WEEKS AS DIRECTED  ? guaiFENesin-codeine (ROBITUSSIN AC) 100-10 MG/5ML syrup Take 5-10 mLs by mouth at bedtime as needed for cough.  ? hydrochlorothiazide (HYDRODIURIL) 25 MG tablet Take 25 mg by mouth daily.  ? losartan (COZAAR) 100 MG tablet TAKE 1 TABLET(100 MG) BY MOUTH DAILY  ? metFORMIN (GLUCOPHAGE-XR) 500 MG 24 hr tablet Take 3 tablets (1,500 mg total) by mouth daily with breakfast.  ? metoprolol tartrate (LOPRESSOR) 100 MG tablet TAKE 1 TABLET(100 MG) BY MOUTH TWICE DAILY  ? mometasone (NASONEX) 50 MCG/ACT nasal spray SHAKE LIQUID AND USE 2 SPRAYS IN EACH NOSTRIL DAILY AS NEEDED  ? montelukast (SINGULAIR) 10 MG tablet Take 1 tablet (10 mg total) by mouth at bedtime as needed.  ? pioglitazone (ACTOS) 30 MG tablet TAKE 1 TABLET(30 MG) BY MOUTH DAILY  ? predniSONE (DELTASONE) 20 MG tablet 3 tabs by mouth  daily x 3 days, then 2 tabs by mouth daily x 2 days then 1 tab by mouth daily x 2 days  ? rosuvastatin (CRESTOR) 5 MG tablet Take 1 tablet (5 mg total) by mouth daily.  ? triamcinolone cream (KENALOG) 0.1 % Apply 1 application topically 2 (two) times daily.  ? ?No facility-administered encounter medications on file as of 03/30/2021.  ? ?Nina Carpenter was contacted to remind of upcoming telephone visit with Nina Carpenter on 04/04/2021 at 8:45. Patient was reminded to have any blood glucose and blood pressure readings available for review at appointment. If unable to reach, a voicemail was left for patient. ? ?Patient will have blood pressure and blood glucose readings for the appointment.  ? ?Star Rating Drugs: ?Medication:  Last Fill: Day Supply ?Glipizide 10 mg 01/25/2021 90 ?Losartan 100 mg 01/25/2021 90  ?Rosuvastatin 5 mg 01/03/2021 90  ?Metformin 500 mg 03/15/2021 90 ? ?Nina Carpenter, CPP notified ? ?Nina Carpenter, RMA ?Clinical Pharmacy Assistant ?[phone] ? ? ? ? ? ? ?

## 2021-03-31 ENCOUNTER — Telehealth: Payer: Self-pay

## 2021-03-31 NOTE — Progress Notes (Signed)
?  Transition CCM to Self Care ? ?Patient contacted to inform they have achieved their CCM goals and no longer need to be contacted as frequently. Patient advised services will still be available to them if they would like to reach out or have any new health concerns. Verified patient had contact information to pharmacist and health concierge on hand. Patient made aware CCM services would be continued if desired. Patient consented to cancel future CCM appointments. ? ?Charlene Brooke, CPP notified ? ?[name], RMA ?Clinical Pharmacy Assistant ?[phone] ?

## 2021-04-04 ENCOUNTER — Telehealth: Payer: Medicare Other

## 2021-04-26 ENCOUNTER — Telehealth: Payer: Self-pay

## 2021-04-26 NOTE — Telephone Encounter (Signed)
Pt is having dental work on 04-28-21. Needs rx for prophylactic antibiotic due to having a pacemaker. Send to [facility]. Call her at [phone] with any questions. ?

## 2021-04-27 MED ORDER — AZITHROMYCIN 250 MG PO TABS: ORAL_TABLET | ORAL | 0 refills | Status: DC

## 2021-04-27 NOTE — Addendum Note (Signed)
Addended by: [name] on: 04/27/2021 05:15 PM ? ? Modules accepted: Orders ? ?

## 2021-04-27 NOTE — Telephone Encounter (Signed)
Ms. Kruckenberg notified as instructed by telephone.  Patient states understanding.  ?

## 2021-04-27 NOTE — Telephone Encounter (Signed)
I sent in 1 pill of azithromycin. ? ?Can you let her know: ?I am pretty sure that she does not need it before dental work, but Dr. Caryl Comes should be the person to make that long-term decision.  Not enough time now. ?

## 2021-04-28 ENCOUNTER — Encounter: Payer: Self-pay | Admitting: Family Medicine

## 2021-04-28 ENCOUNTER — Ambulatory Visit (INDEPENDENT_AMBULATORY_CARE_PROVIDER_SITE_OTHER): Payer: Medicare Other | Admitting: Family Medicine

## 2021-04-28 VITALS — BP 142/60 | HR 89 | Temp 98.5°F | Ht 61.5 in | Wt 200.4 lb

## 2021-04-28 DIAGNOSIS — Z8669 Personal history of other diseases of the nervous system and sense organs: Secondary | ICD-10-CM | POA: Diagnosis not present

## 2021-04-28 DIAGNOSIS — J320 Chronic maxillary sinusitis: Secondary | ICD-10-CM

## 2021-04-28 MED ORDER — DOXYCYCLINE HYCLATE 100 MG PO TABS: 100.0000 mg | ORAL_TABLET | Freq: Two times a day (BID) | ORAL | 0 refills | Status: DC

## 2021-04-28 NOTE — Progress Notes (Signed)
? ? ?[name], MD, [facility] ?Therapist, music at [facility] ?[place] ?[place], [address] ? ?Phone: [phone]  FAX: [phone] ? ?Nina Carpenter - 76 y.o. female  MRN [id number]  Date of Birth: 01-23-1946 ? ?Date: 04/28/2021  PCP: [name], MD  Referral: [name], MD ? ?Chief Complaint  ?Patient presents with  ? Ear Pain  ?  Left  ? Facial Swelling  ? ? ?This visit occurred during the SARS-CoV-2 public health emergency.  Safety protocols were in place, including screening questions prior to the visit, additional usage of staff PPE, and extensive cleaning of exam room while observing appropriate contact time as indicated for disinfecting solutions.  ? ?Subjective:  ? ?Nina Carpenter is a 76 y.o. very pleasant female patient with Body mass index is 37.25 kg/m?. who presents with the following: ? ?She is a very well known 76 year old patient.  She does have a history of trigeminal neuralgia on the left treated with gamma knife. ? ?Today she presents with primarily some a pain at the ear as well as some subjective facial swelling.  Pain is predominantly at the face and about the ear.  She does not have any upper teeth in the left quadrant.  She also has not been to the dentist in roughly 5 years. ? ?She has no incidence of injury. ? ?L ear pain, facial swelling.   Having some pain in the face and in the L ear.  Was out in the yard and hurting.  ? ?Has not been to the dentist in 4-5 years.  ? ? ? ?Review of Systems is noted in the HPI, as appropriate ? ?Objective:  ? ?BP (!) 142/60   Pulse 89   Temp 98.5 ?F (36.9 ?C) (Oral)   Ht 5' 1.5" (1.562 m)   Wt 200 lb 6 oz (90.9 kg)   SpO2 98%   BMI 37.25 kg/m?  ? ?GEN: No acute distress; alert,appropriate. ?PULM: Breathing comfortably in no respiratory distress ?PSYCH: Normally interactive.  ? ?Throat is clear.  Nontender in the left upper mouth.  TMs clear with some serous fluid bilaterally.  Canals  clear and entire movement of the ear does not cause tenderness.  She does have tenderness on the left maxillary sinus. ? ?Laboratory and Imaging Data: ? ?Assessment and Plan:  ? ?  ICD-10-CM   ?1. Left maxillary sinusitis  J32.0   ?  ?2. History of trigeminal neuralgia  Z86.69   ?  ? ?Consistent with sinusitis, not otitis media, no concern for other more concerning implications.  We will treat with doxycycline given her penicillin allergy and supportive care. ? ?Meds ordered this encounter  ?Medications  ? doxycycline (VIBRA-TABS) 100 MG tablet  ?  Sig: Take 1 tablet (100 mg total) by mouth 2 (two) times daily.  ?  Dispense:  20 tablet  ?  Refill:  0  ? ?Medications Discontinued During This Encounter  ?Medication Reason  ? guaiFENesin-codeine (ROBITUSSIN AC) 100-10 MG/5ML syrup Completed Course  ? predniSONE (DELTASONE) 20 MG tablet Completed Course  ? ?No orders of the defined types were placed in this encounter. ? ? ?Follow-up: No follow-ups on file. ? ?Dragon Medical One speech-to-text software was used for transcription in this dictation.  Possible transcriptional errors can occur using Editor, commissioning.  ? ?Signed, ? ?Dawood Spitler T. Hallie Ertl, MD ? ? ?Outpatient Encounter Medications as of 04/28/2021  ?Medication Sig  ? Accu-Chek FastClix Lancets MISC USE TO CHECK  BLOOD SUGAR ONCE EVERY 2 WEEKS  ? albuterol (VENTOLIN HFA) 108 (90 Base) MCG/ACT inhaler Inhale 2 puffs into the lungs every 6 (six) hours as needed for wheezing or shortness of breath.  ? apixaban (ELIQUIS) 5 MG TABS tablet TAKE 1 TABLET BY MOUTH TWICE A DAY  ? Blood Glucose Calibration (ACCU-CHEK GUIDE CONTROL) LIQD 1 each by In Vitro route as directed.  ? Blood Glucose Monitoring Suppl (ACCU-CHEK GUIDE ME) w/Device KIT 1 each by Does not apply route every 14 (fourteen) days. Use to check FBS and 2 HR PP once every 2 weeks  ? doxycycline (VIBRA-TABS) 100 MG tablet Take 1 tablet (100 mg total) by mouth 2 (two) times daily.  ? furosemide (LASIX) 20 MG tablet  Take 1 tablet (20 mg) by mouth once every other day  ? glipiZIDE (GLUCOTROL XL) 10 MG 24 hr tablet Take 1 tablet (10 mg total) by mouth daily with breakfast.  ? glucose blood (ACCU-CHEK GUIDE) test strip USE TO CHECK FASTING GLUCOSE AND 2 HOURS AFTER MEALS ONCE EVERY 2 WEEKS AS DIRECTED  ? hydrochlorothiazide (HYDRODIURIL) 25 MG tablet Take 25 mg by mouth daily.  ? losartan (COZAAR) 100 MG tablet TAKE 1 TABLET(100 MG) BY MOUTH DAILY  ? metFORMIN (GLUCOPHAGE-XR) 500 MG 24 hr tablet Take 3 tablets (1,500 mg total) by mouth daily with breakfast.  ? metoprolol tartrate (LOPRESSOR) 100 MG tablet TAKE 1 TABLET(100 MG) BY MOUTH TWICE DAILY  ? mometasone (NASONEX) 50 MCG/ACT nasal spray SHAKE LIQUID AND USE 2 SPRAYS IN EACH NOSTRIL DAILY AS NEEDED  ? montelukast (SINGULAIR) 10 MG tablet Take 1 tablet (10 mg total) by mouth at bedtime as needed.  ? pioglitazone (ACTOS) 30 MG tablet TAKE 1 TABLET(30 MG) BY MOUTH DAILY  ? rosuvastatin (CRESTOR) 5 MG tablet Take 1 tablet (5 mg total) by mouth daily.  ? triamcinolone cream (KENALOG) 0.1 % Apply 1 application topically 2 (two) times daily.  ? [DISCONTINUED] guaiFENesin-codeine (ROBITUSSIN AC) 100-10 MG/5ML syrup Take 5-10 mLs by mouth at bedtime as needed for cough.  ? [DISCONTINUED] predniSONE (DELTASONE) 20 MG tablet 3 tabs by mouth daily x 3 days, then 2 tabs by mouth daily x 2 days then 1 tab by mouth daily x 2 days  ? azithromycin (ZITHROMAX) 250 MG tablet 1 tab day of dental work prior to appointment (Patient not taking: Reported on 04/28/2021)  ? ?No facility-administered encounter medications on file as of 04/28/2021.  ?  ?

## 2021-05-02 DIAGNOSIS — H524 Presbyopia: Secondary | ICD-10-CM | POA: Diagnosis not present

## 2021-05-02 DIAGNOSIS — E113293 Type 2 diabetes mellitus with mild nonproliferative diabetic retinopathy without macular edema, bilateral: Secondary | ICD-10-CM | POA: Diagnosis not present

## 2021-05-02 LAB — HM DIABETES EYE EXAM

## 2021-06-07 DIAGNOSIS — M62830 Muscle spasm of back: Secondary | ICD-10-CM | POA: Diagnosis not present

## 2021-06-07 DIAGNOSIS — M9903 Segmental and somatic dysfunction of lumbar region: Secondary | ICD-10-CM | POA: Diagnosis not present

## 2021-06-07 DIAGNOSIS — R293 Abnormal posture: Secondary | ICD-10-CM | POA: Diagnosis not present

## 2021-06-07 DIAGNOSIS — M9902 Segmental and somatic dysfunction of thoracic region: Secondary | ICD-10-CM | POA: Diagnosis not present

## 2021-06-16 ENCOUNTER — Other Ambulatory Visit: Payer: Self-pay | Admitting: Family Medicine

## 2021-06-21 DIAGNOSIS — R293 Abnormal posture: Secondary | ICD-10-CM | POA: Diagnosis not present

## 2021-06-21 DIAGNOSIS — M62830 Muscle spasm of back: Secondary | ICD-10-CM | POA: Diagnosis not present

## 2021-06-21 DIAGNOSIS — M9903 Segmental and somatic dysfunction of lumbar region: Secondary | ICD-10-CM | POA: Diagnosis not present

## 2021-06-21 DIAGNOSIS — M9902 Segmental and somatic dysfunction of thoracic region: Secondary | ICD-10-CM | POA: Diagnosis not present

## 2021-06-24 ENCOUNTER — Other Ambulatory Visit: Payer: Self-pay

## 2021-06-24 DIAGNOSIS — I480 Paroxysmal atrial fibrillation: Secondary | ICD-10-CM

## 2021-06-24 MED ORDER — APIXABAN 5 MG PO TABS: 5.0000 mg | ORAL_TABLET | Freq: Two times a day (BID) | ORAL | 5 refills | Status: DC

## 2021-06-24 NOTE — Telephone Encounter (Signed)
Prescription refill request for Eliquis received. Indication: Afib  Last office visit: 11/23/20 Caryl Comes)  Scr: 1.24 (01/03/21)  Age: 76 Weight: 90.9kg  Appropriate dose and refill sent to requested pharmacy.

## 2021-07-03 NOTE — Progress Notes (Unsigned)
    Nina Mitnick T. Robley Matassa, MD, [place] at [facility] [place], [address]  Phone: [phone]  FAX: [phone]  Nina Carpenter - 76 y.o. female  MRN [id number]  Date of Birth: 1945/08/01  Date: 07/04/2021  PCP: [name], MD  Referral: [name], MD  No chief complaint on file.  Subjective:   Nina Carpenter is a 76 y.o. very pleasant female patient with There is no height or weight on file to calculate BMI. who presents with the following:  She presents for f/u of multiple med problems.  Diabetes Mellitus: Tolerating Medications: yes Compliance with diet: fair, There is no height or weight on file to calculate BMI. Exercise: minimal / intermittent Avg blood sugars at home: not checking Foot problems: none Hypoglycemia: none No nausea, vomitting, blurred vision, polyuria.  Lab Results  Component Value Date   HGBA1C 6.6 (H) 01/03/2021   HGBA1C 6.0 (A) 07/05/2020   HGBA1C 6.1 12/29/2019   Lab Results  Component Value Date   MICROALBUR 4.4 (H) 01/03/2021   LDLCALC 137 (H) 01/03/2021   CREATININE 1.24 (H) 01/03/2021    Wt Readings from Last 3 Encounters:  04/28/21 200 lb 6 oz (90.9 kg)  02/18/21 204 lb (92.5 kg)  02/08/21 204 lb (92.5 kg)    Lipids: Doing well, stable. Tolerating meds fine with no SE. Panel reviewed with patient.  Lipids: Lab Results  Component Value Date   CHOL 224 (H) 01/03/2021   Lab Results  Component Value Date   HDL 48.10 01/03/2021   Lab Results  Component Value Date   LDLCALC 137 (H) 01/03/2021   Lab Results  Component Value Date   TRIG 198.0 (H) 01/03/2021   Lab Results  Component Value Date   CHOLHDL 5 01/03/2021    Lab Results  Component Value Date   ALT 16 01/03/2021   AST 15 01/03/2021   ALKPHOS 66 01/03/2021   BILITOT 0.6 01/03/2021    HTN: Tolerating all medications without side effects Stable and at goal No CP, no sob. No  HA.  BP Readings from Last 3 Encounters:  04/28/21 (!) 142/60  01/03/21 (!) 142/80  11/25/20 559/74    Basic Metabolic Panel:    Component Value Date/Time   NA 140 01/03/2021 1145   NA 140 11/23/2020 1134   NA 139 05/09/2013 2023   K 4.9 01/03/2021 1145   K 3.9 05/09/2013 2023   CL 105 01/03/2021 1145   CL 103 05/09/2013 2023   CO2 25 01/03/2021 1145   CO2 29 05/09/2013 2023   BUN 25 (H) 01/03/2021 1145   BUN 29 (H) 11/23/2020 1134   BUN 14 05/09/2013 2023   CREATININE 1.24 (H) 01/03/2021 1145   CREATININE 0.97 05/09/2013 2023   GLUCOSE 178 (H) 01/03/2021 1145   GLUCOSE 141 (H) 05/09/2013 2023   CALCIUM 9.7 01/03/2021 1145   CALCIUM 9.5 05/09/2013 2023     Review of Systems is noted in the HPI, as appropriate  Objective:   There were no vitals taken for this visit.  GEN: No acute distress; alert,appropriate. PULM: Breathing comfortably in no respiratory distress PSYCH: Normally interactive.   Laboratory and Imaging Data:  Assessment and Plan:   ***

## 2021-07-04 ENCOUNTER — Ambulatory Visit (INDEPENDENT_AMBULATORY_CARE_PROVIDER_SITE_OTHER): Payer: Medicare Other | Admitting: Family Medicine

## 2021-07-04 ENCOUNTER — Encounter: Payer: Self-pay | Admitting: Family Medicine

## 2021-07-04 VITALS — BP 128/72 | HR 91 | Temp 98.3°F | Ht 61.5 in | Wt 198.2 lb

## 2021-07-04 DIAGNOSIS — E1159 Type 2 diabetes mellitus with other circulatory complications: Secondary | ICD-10-CM

## 2021-07-04 DIAGNOSIS — Z8673 Personal history of transient ischemic attack (TIA), and cerebral infarction without residual deficits: Secondary | ICD-10-CM | POA: Diagnosis not present

## 2021-07-04 DIAGNOSIS — E785 Hyperlipidemia, unspecified: Secondary | ICD-10-CM

## 2021-07-04 DIAGNOSIS — I10 Essential (primary) hypertension: Secondary | ICD-10-CM

## 2021-07-04 LAB — POCT GLYCOSYLATED HEMOGLOBIN (HGB A1C): Hemoglobin A1C: 6.1 % — AB (ref 4.0–5.6)

## 2021-07-04 NOTE — Patient Instructions (Signed)
Rosuvastatin - go back to taking it.

## 2021-07-15 ENCOUNTER — Other Ambulatory Visit: Payer: Self-pay | Admitting: Family Medicine

## 2021-07-27 ENCOUNTER — Ambulatory Visit (INDEPENDENT_AMBULATORY_CARE_PROVIDER_SITE_OTHER): Payer: Medicare Other

## 2021-07-27 DIAGNOSIS — I495 Sick sinus syndrome: Secondary | ICD-10-CM | POA: Diagnosis not present

## 2021-07-30 LAB — CUP PACEART REMOTE DEVICE CHECK
Battery Remaining Longevity: 26 mo
Battery Voltage: 2.93 V
Brady Statistic AP VP Percent: 0.07 %
Brady Statistic AP VS Percent: 44.01 %
Brady Statistic AS VP Percent: 0.03 %
Brady Statistic AS VS Percent: 55.89 %
Brady Statistic RA Percent Paced: 44 %
Brady Statistic RV Percent Paced: 0.1 %
Date Time Interrogation Session: 20230706090640
Implantable Lead Implant Date: 20141208
Implantable Lead Implant Date: 20141208
Implantable Lead Location: 753859
Implantable Lead Location: 753860
Implantable Lead Model: 5076
Implantable Lead Model: 5076
Implantable Pulse Generator Implant Date: 20141208
Lead Channel Impedance Value: 323 Ohm
Lead Channel Impedance Value: 342 Ohm
Lead Channel Impedance Value: 361 Ohm
Lead Channel Impedance Value: 399 Ohm
Lead Channel Pacing Threshold Amplitude: 0.75 V
Lead Channel Pacing Threshold Amplitude: 0.75 V
Lead Channel Pacing Threshold Pulse Width: 0.4 ms
Lead Channel Pacing Threshold Pulse Width: 0.4 ms
Lead Channel Sensing Intrinsic Amplitude: 2.125 mV
Lead Channel Sensing Intrinsic Amplitude: 2.125 mV
Lead Channel Sensing Intrinsic Amplitude: 8.875 mV
Lead Channel Sensing Intrinsic Amplitude: 8.875 mV
Lead Channel Setting Pacing Amplitude: 2 V
Lead Channel Setting Pacing Amplitude: 2.5 V
Lead Channel Setting Pacing Pulse Width: 0.4 ms
Lead Channel Setting Sensing Sensitivity: 2 mV

## 2021-08-09 ENCOUNTER — Telehealth: Payer: Self-pay | Admitting: Internal Medicine

## 2021-08-09 NOTE — Telephone Encounter (Signed)
Patient does not require SBE prophylaxis.   I will route this recommendation to the requesting party via Epic fax function and remove from pre-op pool.  Please call with questions.  [place], [place] 08/09/2021, 3:30 PM

## 2021-08-09 NOTE — Telephone Encounter (Signed)
I s/w Erin with Dr. Lorenda Hatchet office. Junie Panning tells me that they do not have anything scheduled for the pt. They did refer th pt out to another provider to assess what teeth are savable at this point. Erin, not sure if the pt was calling to say she was having dental work with the provider they referred her out to.   Junie Panning, did ask for their practice what they would like to know going forward , if the pt will require SBE still, though pt has a heart murmur and guidelines have changed. I told Junie Panning that I will at least find that out. I will update the pre op provider as well the pt is not having a procedure with [facility].

## 2021-08-09 NOTE — Telephone Encounter (Signed)
Call received from [place] with Dr. Gloris Manchester- DDS advising that the patient has previously been on SBE prophylaxis. Per Junie Panning, they had sent a fax to [phone] in May to inquire if the patient still needed to have SBE prior to dental cleanings.  I advised Erin, I had never received the fax/ nor could I see this scanned in her chart. Review of the patient's chart- she does not have a valvular issue.  Per Junie Panning, they will need in writing that the patient does not require SBE any longer. She is going to fax a form to our [facility] fax # for Dr. Caryl Comes to sign.  Awaiting fax.

## 2021-08-09 NOTE — Telephone Encounter (Signed)
   Pre-operative Risk Assessment    Patient Name: Nina Carpenter  DOB: [birth year] MRN: [id number]     Request for Surgical Clearance    Procedure:   DENTAL HYGIENE  Date of Surgery:  Clearance TBD                               {  Surgeon:  NOT INDICATED Surgeon's Group or Practice Name:  [facility] Phone number:  [phone] Fax number:  [phone]  Type of Clearance Requested:   - Medical    Type of Anesthesia:  Not Indicated   Additional requests/questions:   PATIENT INDICATES SHE TAKES PREMED DUE TO HEART MURMUR. PLEASE ADVISE IF PATIENT NEEDS TO CONTINUE TO TAKE PREMED FOR DENTAL PROCEDURES  Signed, Eli Phillips   08/09/2021, 11:34 AM

## 2021-08-09 NOTE — Telephone Encounter (Signed)
I left a detailed message for the dental office. Pt has called in to our office today and with information that she was having some dental work done. Left message for the DDS office to please call back and confirm the dental work to be done as well as any type of anesthesia that will be used if any.

## 2021-08-11 ENCOUNTER — Other Ambulatory Visit: Payer: Self-pay | Admitting: Family Medicine

## 2021-08-11 DIAGNOSIS — E1159 Type 2 diabetes mellitus with other circulatory complications: Secondary | ICD-10-CM

## 2021-08-12 MED ORDER — ACCU-CHEK GUIDE VI STRP: ORAL_STRIP | 3 refills | Status: DC

## 2021-08-12 NOTE — Addendum Note (Signed)
Addended by: Carter Kitten on: 08/12/2021 12:18 PM   Modules accepted: Orders

## 2021-08-17 NOTE — Telephone Encounter (Signed)
This has been addressed by the pre-op pool. See the other phone note also dated 08/09/21.

## 2021-08-18 NOTE — Progress Notes (Signed)
Remote pacemaker transmission.   

## 2021-08-24 ENCOUNTER — Other Ambulatory Visit: Payer: Self-pay | Admitting: Family Medicine

## 2021-08-24 NOTE — Telephone Encounter (Signed)
Last office visit 07/04/21 for DM.   Last refilled 11/25/2020 for 454 g with no refills.  CPE scheduled 01/18/22.

## 2021-09-27 ENCOUNTER — Other Ambulatory Visit: Payer: Self-pay | Admitting: Family Medicine

## 2021-11-03 ENCOUNTER — Telehealth: Payer: Self-pay | Admitting: Internal Medicine

## 2021-11-03 ENCOUNTER — Ambulatory Visit (INDEPENDENT_AMBULATORY_CARE_PROVIDER_SITE_OTHER): Payer: Medicare Other

## 2021-11-03 DIAGNOSIS — I495 Sick sinus syndrome: Secondary | ICD-10-CM | POA: Diagnosis not present

## 2021-11-03 LAB — CUP PACEART REMOTE DEVICE CHECK
Battery Remaining Longevity: 22 mo
Battery Voltage: 2.92 V
Brady Statistic AP VP Percent: 0.11 %
Brady Statistic AP VS Percent: 44.11 %
Brady Statistic AS VP Percent: 0.03 %
Brady Statistic AS VS Percent: 55.75 %
Brady Statistic RA Percent Paced: 44.12 %
Brady Statistic RV Percent Paced: 0.14 %
Date Time Interrogation Session: 20231012143831
Implantable Lead Implant Date: 20141208
Implantable Lead Implant Date: 20141208
Implantable Lead Location: 753859
Implantable Lead Location: 753860
Implantable Lead Model: 5076
Implantable Lead Model: 5076
Implantable Pulse Generator Implant Date: 20141208
Lead Channel Impedance Value: 342 Ohm
Lead Channel Impedance Value: 361 Ohm
Lead Channel Impedance Value: 380 Ohm
Lead Channel Impedance Value: 399 Ohm
Lead Channel Pacing Threshold Amplitude: 0.625 V
Lead Channel Pacing Threshold Amplitude: 0.75 V
Lead Channel Pacing Threshold Pulse Width: 0.4 ms
Lead Channel Pacing Threshold Pulse Width: 0.4 ms
Lead Channel Sensing Intrinsic Amplitude: 2.625 mV
Lead Channel Sensing Intrinsic Amplitude: 2.625 mV
Lead Channel Sensing Intrinsic Amplitude: 7.75 mV
Lead Channel Sensing Intrinsic Amplitude: 7.75 mV
Lead Channel Setting Pacing Amplitude: 2 V
Lead Channel Setting Pacing Amplitude: 2.5 V
Lead Channel Setting Pacing Pulse Width: 0.4 ms
Lead Channel Setting Sensing Sensitivity: 2 mV

## 2021-11-03 NOTE — Telephone Encounter (Signed)
Patient called stating she has been out of town and will send missing transmission manually today.

## 2021-11-14 NOTE — Progress Notes (Signed)
Remote pacemaker transmission.   

## 2021-11-16 ENCOUNTER — Other Ambulatory Visit: Payer: Self-pay | Admitting: Family Medicine

## 2021-11-21 ENCOUNTER — Other Ambulatory Visit: Payer: Self-pay | Admitting: Family Medicine

## 2021-11-30 ENCOUNTER — Telehealth: Payer: Self-pay | Admitting: Family Medicine

## 2021-11-30 NOTE — Telephone Encounter (Signed)
FYI to Dr. Diona Browner.

## 2021-11-30 NOTE — Telephone Encounter (Signed)
Patient called in and stated that this morning her blood sugar was reading 251, after she ate this morning it went down to 155. While we was on the phone she checked it and it was reading 130. She isn't sure if its due to the sinus issues she is having. She isn't experiencing any other symptoms but she did schedule an appointment with Dr. Diona Browner tomorrow. Thank you!

## 2021-12-01 ENCOUNTER — Ambulatory Visit (INDEPENDENT_AMBULATORY_CARE_PROVIDER_SITE_OTHER): Payer: Medicare Other | Admitting: Family Medicine

## 2021-12-01 ENCOUNTER — Encounter: Payer: Self-pay | Admitting: Family Medicine

## 2021-12-01 VITALS — BP 170/78 | HR 79 | Temp 98.5°F | Ht 61.5 in | Wt 204.4 lb

## 2021-12-01 DIAGNOSIS — E1159 Type 2 diabetes mellitus with other circulatory complications: Secondary | ICD-10-CM | POA: Diagnosis not present

## 2021-12-01 LAB — POCT GLYCOSYLATED HEMOGLOBIN (HGB A1C): Hemoglobin A1C: 5.8 % — AB (ref 4.0–5.6)

## 2021-12-01 MED ORDER — ACCU-CHEK GUIDE VI STRP: ORAL_STRIP | 3 refills | Status: DC

## 2021-12-01 NOTE — Assessment & Plan Note (Addendum)
Chronic, recent single elevated blood sugar level. A1c today is excellent at 5.8 so I think this was an isolated event likely due to stress. When she took an old Januvia 100 mg daily she had resulting hypoglycemia. I suggested that she continue working on healthy eating, low carb diet,  regular exercise, stress reduction  and weight management, recommended that she continue her current medication regimen including Glucotrol XL 10 mg daily, metformin XR at 1500 mg daily and Actos 30 mg daily.  We did discuss that there is no benefit in pushing her A1c down to 5.8 with medication.  She has upcoming appointment in approximately 30 days for diabetes follow-up with her PCP.  She will discuss at that time potentially decreasing her blood sugar medication further as long as she does not continue to have elevations.

## 2021-12-01 NOTE — Progress Notes (Addendum)
Nina Carpenter   Patient ID: Nina Carpenter, female    DOB: 27-Oct-1945, 76 y.o.   MRN: [id number]  This visit was conducted in person.  BP (!) 170/78   Pulse 79   Temp 98.5 F (36.9 C) (Oral)   Ht 5' 1.5" (1.562 m)   Wt 204 lb 6 oz (92.7 kg)   SpO2 100%   BMI 37.99 kg/m    CC:  Chief Complaint  Patient presents with   Hyperglycemia    See Phone Note- Took Januvia she had in her cabinet-which she is not currently prescribed   Stress    Subjective:   HPI: Nina Carpenter is a 76 y.o. female patient of Dr. Lillie Fragmin with history of type 2 diabetes, hyperlipidemia, history of stroke, hypertension, obesity, obstructive sleep apnea and atrial fibrillation presenting on 12/01/2021 for Hyperglycemia (See Phone Note- Orpah Greek she had in her cabinet-which she is not currently prescribed) and Stress  Reviewed past office visit notes from PCP regarding diabetes Most recent from July 04, 2021. Lab Results  Component Value Date   HGBA1C 6.1 (A) 07/04/2021   Today she reports increased stress and recent measurements of elevated blood sugar. She is currently prescribed glucotrol XL 10 mg daily, metfomrin XR 1500 mg  daily and actos 30 mg daily  She has noted 2 days ago blood sugar.. FBS 251 ( night before she had chick-fila, grilled chicken and french fries)   She took [place]  100 mg that she was prescribed in past... last year.  Later that day blood sugar was 164... dropped to 84 then 67 at midnight.. had some soda and it increased to 98.  This AM FBS 111   No change in diet,   has  had weight gain. Wt Readings from Last 3 Encounters:  12/01/21 204 lb 6 oz (92.7 kg)  07/04/21 198 lb 4 oz (89.9 kg)  04/28/21 200 lb 6 oz (90.9 kg)     Last was checking blood sugar in 05/2021 90-120   She has increased stress with grandson living with her, husband was not there to help her.     Relevant past medical, surgical, family and social history reviewed and updated as indicated. Interim  medical history since our last visit reviewed. Allergies and medications reviewed and updated. Outpatient Medications Prior to Visit  Medication Sig Dispense Refill   Accu-Chek FastClix Lancets MISC USE TO CHECK BLOOD SUGAR ONCE EVERY 2 WEEKS 102 each 0   albuterol (VENTOLIN HFA) 108 (90 Base) MCG/ACT inhaler Inhale 2 puffs into the lungs every 6 (six) hours as needed for wheezing or shortness of breath. 8 g 2   apixaban (ELIQUIS) 5 MG TABS tablet Take 1 tablet (5 mg total) by mouth 2 (two) times daily. 60 tablet 5   Blood Glucose Calibration (ACCU-CHEK GUIDE CONTROL) LIQD 1 each by In Vitro route as directed. 1 each 0   Blood Glucose Monitoring Suppl (ACCU-CHEK GUIDE ME) w/Device KIT 1 each by Does not apply route every 14 (fourteen) days. Use to check FBS and 2 HR PP once every 2 weeks 1 kit 0   furosemide (LASIX) 20 MG tablet Take 1 tablet (20 mg) by mouth once every other day 45 tablet 3   glipiZIDE (GLUCOTROL XL) 10 MG 24 hr tablet TAKE 1 TABLET(10 MG) BY MOUTH DAILY WITH BREAKFAST 90 tablet 1   glucose blood (ACCU-CHEK GUIDE) test strip USE TO CHECK A FASTING BLOOD SUGAR AND A 2 HOUR POSTPRANDIAL DAILY 70 strip  3   hydrochlorothiazide (HYDRODIURIL) 25 MG tablet Take 25 mg by mouth daily.     losartan (COZAAR) 100 MG tablet TAKE 1 TABLET(100 MG) BY MOUTH DAILY 90 tablet 3   metFORMIN (GLUCOPHAGE-XR) 500 MG 24 hr tablet TAKE 3 TABLETS(1500 MG) BY MOUTH DAILY WITH BREAKFAST 90 tablet 5   metoprolol tartrate (LOPRESSOR) 100 MG tablet TAKE 1 TABLET(100 MG) BY MOUTH TWICE DAILY 180 tablet 0   mometasone (NASONEX) 50 MCG/ACT nasal spray SHAKE LIQUID AND USE 2 SPRAYS IN EACH NOSTRIL DAILY AS NEEDED 51 g 0   montelukast (SINGULAIR) 10 MG tablet Take 1 tablet (10 mg total) by mouth at bedtime as needed. 90 tablet 3   pioglitazone (ACTOS) 30 MG tablet TAKE 1 TABLET(30 MG) BY MOUTH DAILY 90 tablet 1   rosuvastatin (CRESTOR) 5 MG tablet Take 1 tablet (5 mg total) by mouth daily. 90 tablet 3    triamcinolone cream (KENALOG) 0.1 % APPLY EXTERNALLY TO THE AFFECTED AREA TWICE DAILY 454 g 1   No facility-administered medications prior to visit.     Per HPI unless specifically indicated in ROS section below Review of Systems  Constitutional:  Negative for fatigue and fever.  HENT:  Negative for congestion.   Eyes:  Negative for pain.  Respiratory:  Negative for cough and shortness of breath.   Cardiovascular:  Negative for chest pain, palpitations and leg swelling.  Gastrointestinal:  Negative for abdominal pain.  Genitourinary:  Negative for dysuria and vaginal bleeding.  Musculoskeletal:  Negative for back pain.  Neurological:  Negative for syncope, light-headedness and headaches.  Psychiatric/Behavioral:  Negative for dysphoric mood.    Objective:  BP (!) 170/78   Pulse 79   Temp 98.5 F (36.9 C) (Oral)   Ht 5' 1.5" (1.562 m)   Wt 204 lb 6 oz (92.7 kg)   SpO2 100%   BMI 37.99 kg/m   Wt Readings from Last 3 Encounters:  12/01/21 204 lb 6 oz (92.7 kg)  07/04/21 198 lb 4 oz (89.9 kg)  04/28/21 200 lb 6 oz (90.9 kg)      Physical Exam Constitutional:      General: She is not in acute distress.    Appearance: Normal appearance. She is well-developed. She is not ill-appearing or toxic-appearing.  HENT:     Head: Normocephalic.     Right Ear: Hearing, tympanic membrane, ear canal and external ear normal. Tympanic membrane is not erythematous, retracted or bulging.     Left Ear: Hearing, tympanic membrane, ear canal and external ear normal. Tympanic membrane is not erythematous, retracted or bulging.     Nose: No mucosal edema or rhinorrhea.     Right Sinus: No maxillary sinus tenderness or frontal sinus tenderness.     Left Sinus: No maxillary sinus tenderness or frontal sinus tenderness.     Mouth/Throat:     Pharynx: Uvula midline.  Eyes:     General: Lids are normal. Lids are everted, no foreign bodies appreciated.     Conjunctiva/sclera: Conjunctivae normal.      Pupils: Pupils are equal, round, and reactive to light.  Neck:     Thyroid: No thyroid mass or thyromegaly.     Vascular: No carotid bruit.     Trachea: Trachea normal.  Cardiovascular:     Rate and Rhythm: Normal rate and regular rhythm.     Pulses: Normal pulses.     Heart sounds: Normal heart sounds, S1 normal and S2 normal. No murmur heard.  No friction rub. No gallop.  Pulmonary:     Effort: Pulmonary effort is normal. No tachypnea or respiratory distress.     Breath sounds: Normal breath sounds. No decreased breath sounds, wheezing, rhonchi or rales.  Abdominal:     General: Bowel sounds are normal.     Palpations: Abdomen is soft.     Tenderness: There is no abdominal tenderness.  Musculoskeletal:     Cervical back: Normal range of motion and neck supple.  Skin:    General: Skin is warm and dry.     Findings: No rash.  Neurological:     Mental Status: She is alert.  Psychiatric:        Mood and Affect: Mood is not anxious or depressed.        Speech: Speech normal.        Behavior: Behavior normal. Behavior is cooperative.        Thought Content: Thought content normal.        Judgment: Judgment normal.       Results for orders placed or performed in visit on 11/03/21  CUP PACEART REMOTE DEVICE CHECK  Result Value Ref Range   Date Time Interrogation Session [id number]    Pulse Generator Manufacturer MERM    Pulse Gen Model [id number] Advisa DR MRI    Pulse Gen Serial Number [id number]    Clinic Name [place] Pulse Generator Type Implantable Pulse Generator    Implantable Pulse Generator Implant Date [id number]    Implantable Lead Manufacturer MERM    Implantable Lead Model 5076 CapSureFix Novus    Implantable Lead Serial Number [id number]    Implantable Lead Implant Date [id number]    Implantable Lead Location Detail 1 APPENDAGE    Implantable Lead Location [id number]    Implantable Lead Manufacturer MERM    Implantable Lead Model 5076  CapSureFix Novus    Implantable Lead Serial Number [id number]    Implantable Lead Implant Date [id number]    Implantable Lead Location Detail 1 APEX    Implantable Lead Location [id number]    Lead Channel Setting Sensing Sensitivity 2 mV   Lead Channel Setting Pacing Amplitude 2 V   Lead Channel Setting Pacing Pulse Width 0.4 ms   Lead Channel Setting Pacing Amplitude 2.5 V   Lead Channel Impedance Value 380 ohm   Lead Channel Impedance Value 342 ohm   Lead Channel Sensing Intrinsic Amplitude 2.625 mV   Lead Channel Sensing Intrinsic Amplitude 2.625 mV   Lead Channel Pacing Threshold Amplitude 0.75 V   Lead Channel Pacing Threshold Pulse Width 0.4 ms   Lead Channel Impedance Value 399 ohm   Lead Channel Impedance Value 361 ohm   Lead Channel Sensing Intrinsic Amplitude 7.75 mV   Lead Channel Sensing Intrinsic Amplitude 7.75 mV   Lead Channel Pacing Threshold Amplitude 0.625 V   Lead Channel Pacing Threshold Pulse Width 0.4 ms   Battery Status OK    Battery Remaining Longevity 22 mo   Battery Voltage 2.92 V   Brady Statistic RA Percent Paced 44.12 %   Brady Statistic RV Percent Paced 0.14 %   Brady Statistic AP VP Percent 0.11 %   Brady Statistic AS VP Percent 0.03 %   Brady Statistic AP VS Percent 44.11 %   Brady Statistic AS VS Percent 55.75 %     COVID 19 screen:  No recent travel or known exposure to COVID19 The patient denies respiratory symptoms of COVID 19 at this time.  The importance of social distancing was discussed today.   Assessment and Plan    Problem List Items Addressed This Visit     Type 2 diabetes mellitus with vascular disease ([place]) - Primary (Chronic)    Chronic, recent single elevated blood sugar level. A1c today is excellent at 5.8 so I think this was an isolated event likely due to stress. When she took an old Januvia 100 mg daily she had resulting hypoglycemia. I suggested that she continue working on healthy eating, low carb diet,  regular exercise,  stress reduction  and weight management, recommended that she continue her current medication regimen including Glucotrol XL 10 mg daily, metformin XR at 1500 mg daily and Actos 30 mg daily.  We did discuss that there is no benefit in pushing her A1c down to 5.8 with medication.  She has upcoming appointment in approximately 30 days for diabetes follow-up with her PCP.  She will discuss at that time potentially decreasing her blood sugar medication further as long as she does not continue to have elevations.      Relevant Medications   glucose blood (ACCU-CHEK GUIDE) test strip   Other Relevant Orders   POCT glycosylated hemoglobin (Hb A1C) (Completed)   Meds ordered this encounter  Medications   glucose blood (ACCU-CHEK GUIDE) test strip    Sig: USE TO CHECK A FASTING BLOOD SUGAR AND A 2 HOUR POSTPRANDIAL DAILY    Dispense:  200 strip    Refill:  3     Eliezer Lofts, MD

## 2021-12-01 NOTE — Telephone Encounter (Signed)
Noted  

## 2021-12-10 ENCOUNTER — Other Ambulatory Visit: Payer: Self-pay | Admitting: Family Medicine

## 2022-01-03 ENCOUNTER — Other Ambulatory Visit: Payer: Self-pay | Admitting: *Deleted

## 2022-01-03 DIAGNOSIS — I480 Paroxysmal atrial fibrillation: Secondary | ICD-10-CM

## 2022-01-03 MED ORDER — APIXABAN 5 MG PO TABS: 5.0000 mg | ORAL_TABLET | Freq: Two times a day (BID) | ORAL | 3 refills | Status: DC

## 2022-01-03 NOTE — Telephone Encounter (Addendum)
Eliquis '5mg'$  refill request received. Patient is 76 years old, weight-92.7kg, Crea-1.24 on 01/03/2021, Diagnosis-Afib, and last seen by Dr. Caryl Comes on 11/23/2020. Dose is appropriate based on dosing criteria.   Pt needs an appt with Cardiologist and updated labs. Sending a message to schedulers.  Pt has an appt on 02/28/2022, will send in a refill and order labs to be done on the same date. Ordered labs and placed a note on MD appt. Limited supply sent at this time.

## 2022-01-04 ENCOUNTER — Other Ambulatory Visit: Payer: Self-pay | Admitting: Family Medicine

## 2022-01-04 DIAGNOSIS — E1159 Type 2 diabetes mellitus with other circulatory complications: Secondary | ICD-10-CM

## 2022-01-08 ENCOUNTER — Other Ambulatory Visit: Payer: Self-pay | Admitting: Family Medicine

## 2022-01-08 DIAGNOSIS — Z79899 Other long term (current) drug therapy: Secondary | ICD-10-CM

## 2022-01-08 DIAGNOSIS — E1159 Type 2 diabetes mellitus with other circulatory complications: Secondary | ICD-10-CM

## 2022-01-08 DIAGNOSIS — E559 Vitamin D deficiency, unspecified: Secondary | ICD-10-CM

## 2022-01-08 DIAGNOSIS — E785 Hyperlipidemia, unspecified: Secondary | ICD-10-CM

## 2022-01-09 ENCOUNTER — Other Ambulatory Visit: Payer: Self-pay | Admitting: *Deleted

## 2022-01-09 MED ORDER — GLIPIZIDE ER 10 MG PO TB24: ORAL_TABLET | ORAL | 1 refills | Status: DC

## 2022-01-09 MED ORDER — LOSARTAN POTASSIUM 100 MG PO TABS: ORAL_TABLET | ORAL | 3 refills | Status: DC

## 2022-01-11 ENCOUNTER — Other Ambulatory Visit: Payer: Medicare Other

## 2022-01-11 ENCOUNTER — Other Ambulatory Visit (INDEPENDENT_AMBULATORY_CARE_PROVIDER_SITE_OTHER): Payer: Medicare Other

## 2022-01-11 DIAGNOSIS — E1159 Type 2 diabetes mellitus with other circulatory complications: Secondary | ICD-10-CM | POA: Diagnosis not present

## 2022-01-11 DIAGNOSIS — E785 Hyperlipidemia, unspecified: Secondary | ICD-10-CM | POA: Diagnosis not present

## 2022-01-11 DIAGNOSIS — E559 Vitamin D deficiency, unspecified: Secondary | ICD-10-CM | POA: Diagnosis not present

## 2022-01-11 DIAGNOSIS — Z79899 Other long term (current) drug therapy: Secondary | ICD-10-CM

## 2022-01-11 LAB — CBC WITH DIFFERENTIAL/PLATELET
Basophils Absolute: 0.1 10*3/uL (ref 0.0–0.1)
Basophils Relative: 1.2 % (ref 0.0–3.0)
Eosinophils Absolute: 0.2 10*3/uL (ref 0.0–0.7)
Eosinophils Relative: 2.9 % (ref 0.0–5.0)
HCT: 38.1 % (ref 36.0–46.0)
Hemoglobin: 12.7 g/dL (ref 12.0–15.0)
Lymphocytes Relative: 26.4 % (ref 12.0–46.0)
Lymphs Abs: 1.5 10*3/uL (ref 0.7–4.0)
MCHC: 33.2 g/dL (ref 30.0–36.0)
MCV: 92.2 fl (ref 78.0–100.0)
Monocytes Absolute: 0.5 10*3/uL (ref 0.1–1.0)
Monocytes Relative: 9.6 % (ref 3.0–12.0)
Neutro Abs: 3.4 10*3/uL (ref 1.4–7.7)
Neutrophils Relative %: 59.9 % (ref 43.0–77.0)
Platelets: 216 10*3/uL (ref 150.0–400.0)
RBC: 4.13 Mil/uL (ref 3.87–5.11)
RDW: 13.8 % (ref 11.5–15.5)
WBC: 5.6 10*3/uL (ref 4.0–10.5)

## 2022-01-11 LAB — LIPID PANEL
Cholesterol: 202 mg/dL — ABNORMAL HIGH (ref 0–200)
HDL: 42.5 mg/dL (ref >39.00)
LDL Cholesterol: 132 mg/dL — ABNORMAL HIGH (ref 0–99)
NonHDL: 159.09
Total CHOL/HDL Ratio: 5
Triglycerides: 134 mg/dL (ref 0.0–149.0)
VLDL: 26.8 mg/dL (ref 0.0–40.0)

## 2022-01-11 LAB — HEMOGLOBIN A1C: Hgb A1c MFr Bld: 6.2 % (ref 4.6–6.5)

## 2022-01-11 LAB — HEPATIC FUNCTION PANEL
ALT: 12 U/L (ref 0–35)
AST: 14 U/L (ref 0–37)
Albumin: 4.4 g/dL (ref 3.5–5.2)
Alkaline Phosphatase: 82 U/L (ref 39–117)
Bilirubin, Direct: 0.1 mg/dL (ref 0.0–0.3)
Total Bilirubin: 0.7 mg/dL (ref 0.2–1.2)
Total Protein: 6.6 g/dL (ref 6.0–8.3)

## 2022-01-11 LAB — BASIC METABOLIC PANEL
BUN: 20 mg/dL (ref 6–23)
CO2: 27 mEq/L (ref 19–32)
Calcium: 9.6 mg/dL (ref 8.4–10.5)
Chloride: 104 mEq/L (ref 96–112)
Creatinine, Ser: 1.19 mg/dL (ref 0.40–1.20)
GFR: 44.43 mL/min — ABNORMAL LOW (ref >60.00)
Glucose, Bld: 120 mg/dL — ABNORMAL HIGH (ref 70–99)
Potassium: 4.8 mEq/L (ref 3.5–5.1)
Sodium: 139 mEq/L (ref 135–145)

## 2022-01-11 LAB — MICROALBUMIN / CREATININE URINE RATIO
Creatinine,U: 56.8 mg/dL
Microalb Creat Ratio: 2.4 mg/g (ref 0.0–30.0)
Microalb, Ur: 1.3 mg/dL (ref 0.0–1.9)

## 2022-01-11 LAB — TSH: TSH: 2.76 u[IU]/mL (ref 0.35–5.50)

## 2022-01-11 LAB — VITAMIN D 25 HYDROXY (VIT D DEFICIENCY, FRACTURES): VITD: 28.91 ng/mL — ABNORMAL LOW (ref 30.00–100.00)

## 2022-01-18 ENCOUNTER — Encounter: Payer: Self-pay | Admitting: Family Medicine

## 2022-01-18 ENCOUNTER — Ambulatory Visit (INDEPENDENT_AMBULATORY_CARE_PROVIDER_SITE_OTHER): Payer: Medicare Other | Admitting: Family Medicine

## 2022-01-18 VITALS — BP 140/74 | HR 85 | Temp 97.5°F | Ht 61.75 in | Wt 201.2 lb

## 2022-01-18 DIAGNOSIS — I10 Essential (primary) hypertension: Secondary | ICD-10-CM

## 2022-01-18 DIAGNOSIS — I480 Paroxysmal atrial fibrillation: Secondary | ICD-10-CM | POA: Diagnosis not present

## 2022-01-18 DIAGNOSIS — E785 Hyperlipidemia, unspecified: Secondary | ICD-10-CM

## 2022-01-18 DIAGNOSIS — Z8673 Personal history of transient ischemic attack (TIA), and cerebral infarction without residual deficits: Secondary | ICD-10-CM | POA: Diagnosis not present

## 2022-01-18 DIAGNOSIS — E1159 Type 2 diabetes mellitus with other circulatory complications: Secondary | ICD-10-CM | POA: Diagnosis not present

## 2022-01-18 NOTE — Progress Notes (Signed)
Nina Fraizer T. Nina Icenogle, MD, [place] at [facility] [place], [address]  Phone: [phone]  FAX: [phone]  Nina Carpenter - 76 y.o. female  MRN [id number]  Date of Birth: Apr 21, 1945  Date: 01/18/2022  PCP: [name], MD  Referral: [name], MD  Chief Complaint  Patient presents with   Annual Exam    Part 2 ([place] 02/08/2021)   Patient Care Team: [name], MD as PCP - General Debbora Dus, [facility] as Pharmacist (Pharmacist) Subjective:   Nina Carpenter is a 75 y.o. pleasant patient who presents for a chronic illness follow-up:  Tobacco History Reviewed. Non-smoker Alcohol: No concerns, no excessive use Exercise Habits: Some activity, rec at least 30 mins 5 times a week STD concerns: none Drug Use: None Birth control method: n/a Menses regular: n/a Lumps or breast concerns: no Breast Cancer Family History: no  Tdap - due for a tetanus booster Shingrix Foot  Eye exam Covid booster - does not want it  Diabetes Mellitus: Tolerating Medications:  Compliance with diet: fair, Body mass index is 37.11 kg/m. Exercise: minimal / intermittent Avg blood sugars at home: not checking Foot problems: none Hypoglycemia: none No nausea, vomitting, blurred vision, polyuria.  Lab Results  Component Value Date   HGBA1C 6.2 01/11/2022   HGBA1C 5.8 (A) 12/01/2021   HGBA1C 6.1 (A) 07/04/2021   Lab Results  Component Value Date   MICROALBUR 1.3 01/11/2022   LDLCALC 132 (H) 01/11/2022   CREATININE 1.19 01/11/2022    Wt Readings from Last 3 Encounters:  01/18/22 201 lb 4 oz (91.3 kg)  12/01/21 204 lb 6 oz (92.7 kg)  07/04/21 198 lb 4 oz (89.9 kg)    HTN: Tolerating all medications without side effects Stable and at goal No CP, no sob. No HA.  BP Readings from Last 3 Encounters:  01/18/22 (!) 140/74  12/01/21 (!) 170/78  07/04/21 128/72   Lipids: Doing well, stable, but she  stopped her statin again.  Panel reviewed with patient.  Lipids: Lab Results  Component Value Date   CHOL 202 (H) 01/11/2022   Lab Results  Component Value Date   HDL 42.50 01/11/2022   Lab Results  Component Value Date   LDLCALC 132 (H) 01/11/2022   Lab Results  Component Value Date   TRIG 134.0 01/11/2022   Lab Results  Component Value Date   CHOLHDL 5 01/11/2022    Lab Results  Component Value Date   ALT 12 01/11/2022   AST 14 01/11/2022   ALKPHOS 82 01/11/2022   BILITOT 0.7 01/11/2022    Immunization History  Administered Date(s) Administered   Fluad Quad(high Dose 65+) 01/05/2020, 01/03/2021   Influenza Split 11/13/2011   Influenza, High Dose Seasonal PF 11/23/2016   Influenza,inj,Quad PF,6+ Mos 11/18/2012, 10/06/2013, 12/23/2014, 03/20/2016, 01/28/2018   PFIZER(Purple Top)SARS-COV-2 Vaccination 03/07/2019, 04/01/2019   Pneumococcal Conjugate-13 10/06/2013   Pneumococcal Polysaccharide-23 10/11/2010    Patient Active Problem List   Diagnosis Date Noted   History of stroke 01/06/2013    Priority: High   Atrial fibrillation ([place]) 12/29/2012    Priority: High   Type 2 diabetes mellitus with vascular disease ([place]) 05/04/2008    Priority: High   OSA (obstructive sleep apnea) 05/25/2014    Priority: Medium    Cardiac pacemaker in situ 01/06/2013    Priority: Medium    Tachy-brady syndrome ([place]) 12/29/2012    Priority: Medium    Hx of rheumatic  fever 08/07/2011    Priority: Medium    NEURALGIA, TRIGEMINAL 01/26/2010    Priority: Medium    Hyperlipidemia LDL goal <70 05/04/2008    Priority: Medium    Essential hypertension 05/04/2008    Priority: Medium    ASTHMA 05/04/2008    Priority: Medium    GERD 05/04/2008    Past Medical History:  Diagnosis Date   Acid reflux disease    Asthma    a. Remotely.   Chronic combined systolic and diastolic CHF (congestive heart failure) ([place])    a. Felt related to AF. EF 45-50% by echo 12/2012.   CVA  (cerebral vascular accident) ([place])    a. Thromboemoblic L MCA CVA 15/1761 with no residual deficit except mild occasional word finding. b. 6-07% RICA/LICA by dopplers 37/1/06.   GERD (gastroesophageal reflux disease)    H/O medication noncompliance    a. Per PCP note 01/06/13: Previously refused anything greater than aspirin, refused treatment for DM, and often stopped hypertensive meds and lipid meds.   Hx of rheumatic fever    a. As a child.   Hyperlipidemia    Hypertension    Kidney stones    a. Remotely.   PAF (paroxysmal atrial fibrillation) ([place])    a. Dx 09/2012, chronicity unclear at that time. Initially refused Xarelto due to risk of bleeding. b. Sustained CVA 12/2012 - agreeable to taking Eliquis. Tachybrady syndrome during that admission s/p Medtronic PPM implantation;  c. Failed flecainide, sotalol, amiodarone (itching), and propafenone.   Tachy-brady syndrome ([place])    a. AF RVR with pauses >5sec requiring Medtronic PPM 12/2012.   Tubular adenoma of colon 05/2008   Type 2 diabetes mellitus ([place])     Past Surgical History:  Procedure Laterality Date   CHOLECYSTECTOMY     DESTRUCTION TRIGEMINAL NERVE VIA NEUROLYTIC AGENT  NOv _0    Gamma Knife    ENDOMETRIAL ABLATION     INSERT / REPLACE / REMOVE PACEMAKER     PACEMAKER INSERTION  12-30-12   MDT dual chamber pacemaker implanted by Dr Caryl Comes for tachy-brady syndrome   PERMANENT PACEMAKER INSERTION N/A 12/30/2012   Procedure: PERMANENT PACEMAKER INSERTION;  Surgeon: Deboraha Sprang, MD;  Location: [facility] CATH LAB;  Service: Cardiovascular;  Laterality: N/A;   TUBAL LIGATION     ectopic preganacy     Family History  Problem Relation Age of Onset   Stroke Mother    Diabetes Mother    Hypertension Mother     Social History   Social History Narrative   Lives in [place] with her husband.  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    Past Medical History, Surgical History, Social History, Family History, Problem List,  Medications, and Allergies have been reviewed and updated if relevant.  Review of Systems: Pertinent positives are listed above.  Otherwise, a full 14 point review of systems has been done in full and it is negative except where it is noted positive.  Objective:   BP (!) 140/74   Pulse 85   Temp (!) 97.5 F (36.4 C) (Oral)   Ht 5' 1.75" (1.568 m)   Wt 201 lb 4 oz (91.3 kg)   SpO2 97%   BMI 37.11 kg/m     04/16/2017    3:38 PM 07/15/2018   11:07 AM 01/05/2020    9:28 AM 01/03/2021   10:51 AM 02/08/2021    9:51 AM  Fall Risk  Falls in the past year? No 0 1 0 0  Was there an injury with Fall?   0  0  Fall Risk Category Calculator   1  0  Fall Risk Category   Low  Low  Patient Fall Risk Level   Low fall risk  Low fall risk  Patient at Risk for Falls Due to   No Fall Risks  No Fall Risks  Fall risk Follow up   Falls evaluation completed  Falls prevention discussed   Ideal Body Weight: Weight in (lb) to have BMI = 25: 135.3 No results found.    02/08/2021    9:52 AM 01/03/2021   10:51 AM 01/05/2020    9:27 AM 07/15/2018   11:07 AM 04/16/2017    3:38 PM  Depression screen PHQ 2/9  Decreased Interest 0 0 0 0 0  Down, Depressed, Hopeless 0 0 0 0 0  PHQ - 2 Score 0 0 0 0 0  Altered sleeping    0 0  Tired, decreased energy    0 0  Change in appetite    0 0  Feeling bad or failure about yourself     0 0  Trouble concentrating    0 0  Moving slowly or fidgety/restless    0 0  Suicidal thoughts    0 0  PHQ-9 Score    0 0  Difficult doing work/chores    Not difficult at all Not difficult at all   GEN: no acute distress. HEENT: Atraumatic, Normocephalic.  Ears and Nose: No external deformity. CV: RRR, No M/G/R. No JVD. No thrill. No extra heart sounds. PULM: CTA B, no wheezes, crackles, rhonchi. No retractions. No resp. distress. No accessory muscle use. ABD: S, NT, ND, +BS. No rebound. No HSM. EXTR: No c/c/e PSYCH: Normally interactive. Conversant.    All labs reviewed with  patient.  Lipids: Lab Results  Component Value Date   CHOL 202 (H) 01/11/2022   Lab Results  Component Value Date   HDL 42.50 01/11/2022   Lab Results  Component Value Date   LDLCALC 132 (H) 01/11/2022   Lab Results  Component Value Date   TRIG 134.0 01/11/2022   Lab Results  Component Value Date   CHOLHDL 5 01/11/2022   CBC:    Latest Ref Rng & Units 01/11/2022    8:54 AM 01/03/2021   11:45 AM 11/23/2020   11:34 AM  CBC  WBC 4.0 - 10.5 K/uL 5.6  4.8  6.4   Hemoglobin 12.0 - 15.0 g/dL 12.7  12.1  12.5   Hematocrit 36.0 - 46.0 % 38.1  36.9  37.9   Platelets 150.0 - 400.0 K/uL 216.0  243.0  240     Basic Metabolic Panel:    Component Value Date/Time   NA 139 01/11/2022 0854   NA 140 11/23/2020 1134   NA 139 05/09/2013 2023   K 4.8 01/11/2022 0854   K 3.9 05/09/2013 2023   CL 104 01/11/2022 0854   CL 103 05/09/2013 2023   CO2 27 01/11/2022 0854   CO2 29 05/09/2013 2023   BUN 20 01/11/2022 0854   BUN 29 (H) 11/23/2020 1134   BUN 14 05/09/2013 2023   CREATININE 1.19 01/11/2022 0854   CREATININE 0.97 05/09/2013 2023   GLUCOSE 120 (H) 01/11/2022 0854   GLUCOSE 141 (H) 05/09/2013 2023   CALCIUM 9.6 01/11/2022 0854   CALCIUM 9.5 05/09/2013 2023      Latest Ref Rng & Units 01/11/2022    8:54 AM 01/03/2021   11:45 AM  12/29/2019   10:09 AM  Hepatic Function  Total Protein 6.0 - 8.3 g/dL 6.6  6.9  7.0   Albumin 3.5 - 5.2 g/dL 4.4  4.3  4.4   AST 0 - 37 U/L _0 ALT 0 - 35 U/L _1 Alk Phosphatase 39 - 117 U/L 82  66  65   Total Bilirubin 0.2 - 1.2 mg/dL 0.7  0.6  0.6   Bilirubin, Direct 0.0 - 0.3 mg/dL 0.1  0.1  0.1     Lab Results  Component Value Date   HGBA1C 6.2 01/11/2022   Lab Results  Component Value Date   TSH 2.76 01/11/2022    No results found.  Assessment and Plan:     ICD-10-CM   1. Type 2 diabetes mellitus with vascular disease (HCC)  E11.59     2. History of stroke  Z86.73     3. Paroxysmal atrial fibrillation  (HCC)  I48.0     4. Hyperlipidemia LDL goal <70  E78.5     5. Essential hypertension  I10      Type 2 diabetes is stable under current medications.  No changes.  Hyperlipidemia, she declines a statin and she stopped her statin on her own.  History of stroke, compliant with all medications with the exception of statin.  Blood pressure is mildly elevated today, and she is going to check this at home every day over the next 2 weeks.  If it is over 130/80, she will let me know and we will alter her medication regimen.  Disposition: 6 months diabetes follow-up  Future Appointments  Date Time Provider [place]  02/02/2022  2:45 PM [facility] DEVICE REMOTES [facility]  02/09/2022  9:45 AM [facility] NURSE HEALTH ADVISOR [facility]  02/28/2022 11:20 AM Deboraha Sprang, MD [facility] None  02/28/2022 12:00 PM [facility] LAB [facility]  05/04/2022  2:45 PM [facility] DEVICE REMOTES [facility]  07/20/2022 10:20 AM Davante Gerke, Frederico Hamman, MD [facility]  08/03/2022  2:45 PM [facility] DEVICE REMOTES [facility]  11/02/2022  2:45 PM [facility] DEVICE REMOTES [facility]  02/01/2023  2:45 PM [facility] DEVICE REMOTES [facility]    No orders of the defined types were placed in this encounter.  Medications Discontinued During This Encounter  Medication Reason   rosuvastatin (CRESTOR) 5 MG tablet    Orders Placed This Encounter  Procedures   HM DIABETES EYE EXAM    Signed,  Natarsha Hurwitz T. Aryan Sparks, MD   Allergies as of 01/18/2022       Reactions   Bee Venom Anaphylaxis   Fish Allergy Swelling   Shell fish   Penicillins Hives, Itching   Amiodarone Itching   Iodine Itching   itching   Ace Inhibitors Cough   Januvia [sitagliptin] Rash   Lidocaine Hcl Palpitations   ALL CAINES given during dental procedures   Lipitor [atorvastatin] Other (See Comments)   myalgias        Medication List         Accurate as of January 18, 2022  1:42 PM. If you have any questions, ask your nurse or doctor.          STOP taking these medications    rosuvastatin 5 MG tablet Commonly known as: Crestor Stopped by: [name], MD       TAKE these medications    Accu-Chek FastClix Lancets Misc USE TO CHECK BLOOD SUGAR ONCE EVERY 2 WEEKS  Accu-Chek Guide Control Liqd 1 each by In Vitro route as directed.   Accu-Chek Guide Me w/Device Kit 1 each by Does not apply route every 14 (fourteen) days. Use to check FBS and 2 HR PP once every 2 weeks   Accu-Chek Guide test strip Generic drug: glucose blood USE TO CHECK A FASTING BLOOD SUGAR AND A 2 HOUR POSTPRANDIAL DAILY   albuterol 108 (90 Base) MCG/ACT inhaler Commonly known as: VENTOLIN HFA Inhale 2 puffs into the lungs every 6 (six) hours as needed for wheezing or shortness of breath.   apixaban 5 MG Tabs tablet Commonly known as: Eliquis Take 1 tablet (5 mg total) by mouth 2 (two) times daily.   furosemide 20 MG tablet Commonly known as: LASIX Take 1 tablet (20 mg) by mouth once every other day   glipiZIDE 10 MG 24 hr tablet Commonly known as: GLUCOTROL XL TAKE 1 TABLET(10 MG) BY MOUTH DAILY WITH BREAKFAST   hydrochlorothiazide 25 MG tablet Commonly known as: HYDRODIURIL Take 25 mg by mouth daily.   losartan 100 MG tablet Commonly known as: COZAAR TAKE 1 TABLET(100 MG) BY MOUTH DAILY   metFORMIN 500 MG 24 hr tablet Commonly known as: GLUCOPHAGE-XR TAKE 3 TABLETS(1500 MG) BY MOUTH DAILY WITH BREAKFAST   metoprolol tartrate 100 MG tablet Commonly known as: LOPRESSOR TAKE 1 TABLET(100 MG) BY MOUTH TWICE DAILY   mometasone 50 MCG/ACT nasal spray Commonly known as: NASONEX SHAKE LIQUID AND USE 2 SPRAYS IN EACH NOSTRIL DAILY AS NEEDED   montelukast 10 MG tablet Commonly known as: SINGULAIR Take 1 tablet (10 mg total) by mouth at bedtime as needed.   pioglitazone 30 MG tablet Commonly known as: ACTOS TAKE 1  TABLET(30 MG) BY MOUTH DAILY   triamcinolone cream 0.1 % Commonly known as: KENALOG APPLY EXTERNALLY TO THE AFFECTED AREA TWICE DAILY

## 2022-01-18 NOTE — Patient Instructions (Signed)
Talk to pharmacy:   Shingles vaccine (Shingrix)  Tetanus booster

## 2022-02-02 ENCOUNTER — Ambulatory Visit (INDEPENDENT_AMBULATORY_CARE_PROVIDER_SITE_OTHER): Payer: Medicare Other

## 2022-02-02 DIAGNOSIS — I495 Sick sinus syndrome: Secondary | ICD-10-CM

## 2022-02-06 LAB — CUP PACEART REMOTE DEVICE CHECK
Battery Remaining Longevity: 20 mo
Battery Voltage: 2.91 V
Brady Statistic AP VP Percent: 0.03 %
Brady Statistic AP VS Percent: 45.12 %
Brady Statistic AS VP Percent: 0.01 %
Brady Statistic AS VS Percent: 54.85 %
Brady Statistic RA Percent Paced: 44.98 %
Brady Statistic RV Percent Paced: 0.04 %
Date Time Interrogation Session: 20240113123409
Implantable Lead Connection Status: 753985
Implantable Lead Connection Status: 753985
Implantable Lead Implant Date: 20141208
Implantable Lead Implant Date: 20141208
Implantable Lead Location: 753859
Implantable Lead Location: 753860
Implantable Lead Model: 5076
Implantable Lead Model: 5076
Implantable Pulse Generator Implant Date: 20141208
Lead Channel Impedance Value: 323 Ohm
Lead Channel Impedance Value: 361 Ohm
Lead Channel Impedance Value: 380 Ohm
Lead Channel Impedance Value: 418 Ohm
Lead Channel Pacing Threshold Amplitude: 0.625 V
Lead Channel Pacing Threshold Amplitude: 0.875 V
Lead Channel Pacing Threshold Pulse Width: 0.4 ms
Lead Channel Pacing Threshold Pulse Width: 0.4 ms
Lead Channel Sensing Intrinsic Amplitude: 2.25 mV
Lead Channel Sensing Intrinsic Amplitude: 2.25 mV
Lead Channel Sensing Intrinsic Amplitude: 8.625 mV
Lead Channel Sensing Intrinsic Amplitude: 8.625 mV
Lead Channel Setting Pacing Amplitude: 2 V
Lead Channel Setting Pacing Amplitude: 2.5 V
Lead Channel Setting Pacing Pulse Width: 0.4 ms
Lead Channel Setting Sensing Sensitivity: 2 mV
Zone Setting Status: 755011
Zone Setting Status: 755011

## 2022-02-09 ENCOUNTER — Telehealth: Payer: Self-pay | Admitting: Family Medicine

## 2022-02-09 ENCOUNTER — Ambulatory Visit (INDEPENDENT_AMBULATORY_CARE_PROVIDER_SITE_OTHER): Payer: Medicare Other

## 2022-02-09 VITALS — Ht 61.75 in | Wt 201.0 lb

## 2022-02-09 DIAGNOSIS — Z0000 Encounter for general adult medical examination without abnormal findings: Secondary | ICD-10-CM | POA: Diagnosis not present

## 2022-02-09 NOTE — Telephone Encounter (Signed)
Patient called in and stated that she has been having an allergic reaction to one of her medication. She stated she is experiencing itchy, hotness, and scratchy. Sent over access nurse.

## 2022-02-09 NOTE — Patient Instructions (Addendum)
[name] , Thank you for taking time to come for your Medicare Wellness Visit. I appreciate your ongoing commitment to your health goals. Please review the following plan we discussed and let me know if I can assist you in the future.   These are the goals we discussed:     Goals Addressed             This Visit's Progress    Maintain healthy lifestyle       Stay active Healthy diet        This is a list of the screening recommended for you and due dates:  Health Maintenance  Topic Date Due   DTaP/Tdap/Td vaccine (1 - Tdap) Never done   COVID-19 Vaccine (3 - Pfizer risk series) 02/25/2022*   Flu Shot  04/23/2022*   Zoster (Shingles) Vaccine (1 of 2) 05/11/2022*   DEXA scan (bone density measurement)  06/24/2022*   Hemoglobin A1C  07/13/2022   Yearly kidney function blood test for diabetes  01/12/2023   Yearly kidney health urinalysis for diabetes  01/12/2023   Complete foot exam   01/19/2023   Eye exam for diabetics  01/19/2023   Medicare Annual Wellness Visit  02/10/2023   Pneumonia Vaccine  Completed   Hepatitis C Screening: USPSTF Recommendation to screen - Ages 18-79 yo.  Completed   HPV Vaccine  Aged Out   Colon Cancer Screening  Discontinued   Cologuard (Stool DNA test)  Discontinued  *Topic was postponed. The date shown is not the original due date.    Next appointment: Follow up in one year for your annual wellness visit    Preventive Care 65 Years and Older, Female Preventive care refers to lifestyle choices and visits with your health care provider that can promote health and wellness. What does preventive care include? A yearly physical exam. This is also called an annual well check. Dental exams once or twice a year. Routine eye exams. Ask your health care provider how often you should have your eyes checked. Personal lifestyle choices, including: Daily care of your teeth and gums. Regular physical activity. Eating a healthy diet. Avoiding tobacco  and drug use. Limiting alcohol use. Practicing safe sex. Taking low-dose aspirin every day. Taking vitamin and mineral supplements as recommended by your health care provider. What happens during an annual well check? The services and screenings done by your health care provider during your annual well check will depend on your age, overall health, lifestyle risk factors, and family history of disease. Counseling  Your health care provider may ask you questions about your: Alcohol use. Tobacco use. Drug use. Emotional well-being. Home and relationship well-being. Sexual activity. Eating habits. History of falls. Memory and ability to understand (cognition). Work and work Statistician. Reproductive health. Screening  You may have the following tests or measurements: Height, weight, and BMI. Blood pressure. Lipid and cholesterol levels. These may be checked every 5 years, or more frequently if you are over 68 years old. Skin check. Lung cancer screening. You may have this screening every year starting at age 85 if you have a 30-pack-year history of smoking and currently smoke or have quit within the past 15 years. Fecal occult blood test (FOBT) of the stool. You may have this test every year starting at age 61. Flexible sigmoidoscopy or colonoscopy. You may have a sigmoidoscopy every 5 years or a colonoscopy every 10 years starting at age 75. Hepatitis C blood test. Hepatitis B blood test. Sexually transmitted disease (  STD) testing. Diabetes screening. This is done by checking your blood sugar (glucose) after you have not eaten for a while (fasting). You may have this done every 1-3 years. Bone density scan. This is done to screen for osteoporosis. You may have this done starting at age 38. Mammogram. This may be done every 1-2 years. Talk to your health care provider about how often you should have regular mammograms. Talk with your health care provider about your test results,  treatment options, and if necessary, the need for more tests. Vaccines  Your health care provider may recommend certain vaccines, such as: Influenza vaccine. This is recommended every year. Tetanus, diphtheria, and acellular pertussis (Tdap, Td) vaccine. You may need a Td booster every 10 years. Zoster vaccine. You may need this after age 45. Pneumococcal 13-valent conjugate (PCV13) vaccine. One dose is recommended after age 74. Pneumococcal polysaccharide (PPSV23) vaccine. One dose is recommended after age 108. Talk to your health care provider about which screenings and vaccines you need and how often you need them. This information is not intended to replace advice given to you by your health care provider. Make sure you discuss any questions you have with your health care provider. Document Released: 02/05/2015 Document Revised: 09/29/2015 Document Reviewed: 11/10/2014 Elsevier Interactive Patient Education  2017 [employer] Prevention in the Home Falls can cause injuries. They can happen to people of all ages. There are many things you can do to make your home safe and to help prevent falls. What can I do on the outside of my home? Regularly fix the edges of walkways and driveways and fix any cracks. Remove anything that might make you trip as you walk through a door, such as a raised step or threshold. Trim any bushes or trees on the path to your home. Use bright outdoor lighting. Clear any walking paths of anything that might make someone trip, such as rocks or tools. Regularly check to see if handrails are loose or broken. Make sure that both sides of any steps have handrails. Any raised decks and porches should have guardrails on the edges. Have any leaves, snow, or ice cleared regularly. Use sand or salt on walking paths during winter. Clean up any spills in your garage right away. This includes oil or grease spills. What can I do in the bathroom? Use night  lights. Install grab bars by the toilet and in the tub and shower. Do not use towel bars as grab bars. Use non-skid mats or decals in the tub or shower. If you need to sit down in the shower, use a plastic, non-slip stool. Keep the floor dry. Clean up any water that spills on the floor as soon as it happens. Remove soap buildup in the tub or shower regularly. Attach bath mats securely with double-sided non-slip rug tape. Do not have throw rugs and other things on the floor that can make you trip. What can I do in the bedroom? Use night lights. Make sure that you have a light by your bed that is easy to reach. Do not use any sheets or blankets that are too big for your bed. They should not hang down onto the floor. Have a firm chair that has side arms. You can use this for support while you get dressed. Do not have throw rugs and other things on the floor that can make you trip. What can I do in the kitchen? Clean up any spills right away. Avoid walking on  wet floors. Keep items that you use a lot in easy-to-reach places. If you need to reach something above you, use a strong step stool that has a grab bar. Keep electrical cords out of the way. Do not use floor polish or wax that makes floors slippery. If you must use wax, use non-skid floor wax. Do not have throw rugs and other things on the floor that can make you trip. What can I do with my stairs? Do not leave any items on the stairs. Make sure that there are handrails on both sides of the stairs and use them. Fix handrails that are broken or loose. Make sure that handrails are as long as the stairways. Check any carpeting to make sure that it is firmly attached to the stairs. Fix any carpet that is loose or worn. Avoid having throw rugs at the top or bottom of the stairs. If you do have throw rugs, attach them to the floor with carpet tape. Make sure that you have a light switch at the top of the stairs and the bottom of the stairs. If  you do not have them, ask someone to add them for you. What else can I do to help prevent falls? Wear shoes that: Do not have high heels. Have rubber bottoms. Are comfortable and fit you well. Are closed at the toe. Do not wear sandals. If you use a stepladder: Make sure that it is fully opened. Do not climb a closed stepladder. Make sure that both sides of the stepladder are locked into place. Ask someone to hold it for you, if possible. Clearly mark and make sure that you can see: Any grab bars or handrails. First and last steps. Where the edge of each step is. Use tools that help you move around (mobility aids) if they are needed. These include: Canes. Walkers. Scooters. Crutches. Turn on the lights when you go into a dark area. Replace any light bulbs as soon as they burn out. Set up your furniture so you have a clear path. Avoid moving your furniture around. If any of your floors are uneven, fix them. If there are any pets around you, be aware of where they are. Review your medicines with your doctor. Some medicines can make you feel dizzy. This can increase your chance of falling. Ask your doctor what other things that you can do to help prevent falls. This information is not intended to replace advice given to you by your health care provider. Make sure you discuss any questions you have with your health care provider. Document Released: 11/05/2008 Document Revised: 06/17/2015 Document Reviewed: 02/13/2014 Elsevier Interactive Patient Education  2017 [employer].

## 2022-02-09 NOTE — Progress Notes (Signed)
Subjective:   Nina Carpenter is a 77 y.o. female who presents for Medicare Annual (Subsequent) preventive examination.  Review of Systems    No ROS.  Medicare Wellness Virtual Visit.  Visual/audio telehealth visit, UTA vital signs.   See social history for additional risk factors.   Cardiac Risk Factors include: advanced age (>45mn, >>74women);hypertension;diabetes mellitus     Objective:    Today's Vitals   02/09/22 0907  Weight: 201 lb (91.2 kg)  Height: 5' 1.75" (1.568 m)   Body mass index is 37.06 kg/m.     02/09/2022    9:14 AM 02/08/2021    9:49 AM 07/15/2018   11:06 AM 04/16/2017   10:59 AM 02/22/2013    2:11 PM 01/28/2013    2:57 AM 01/11/2013    8:19 PM  Advanced Directives  Does Patient Have a Medical Advance Directive? No Yes No No Patient does not have advance directive;Patient would not like information Patient does not have advance directive Patient does not have advance directive  Type of Advance Directive  [place]       Does patient want to make changes to medical advance directive?  Yes (MAU/Ambulatory/Procedural Areas - Information given)       Would patient like information on creating a medical advance directive? No - Patient declined  No - Patient declined Yes (MAU/Ambulatory/Procedural Areas - Information given)     Pre-existing out of facility DNR order (yellow form or pink MOST form)     No No No    Current Medications (verified) Outpatient Encounter Medications as of 02/09/2022  Medication Sig   Accu-Chek FastClix Lancets MISC USE TO CHECK BLOOD SUGAR ONCE EVERY 2 WEEKS   albuterol (VENTOLIN HFA) 108 (90 Base) MCG/ACT inhaler Inhale 2 puffs into the lungs every 6 (six) hours as needed for wheezing or shortness of breath.   apixaban (ELIQUIS) 5 MG TABS tablet Take 1 tablet (5 mg total) by mouth 2 (two) times daily.   Blood Glucose Calibration (ACCU-CHEK GUIDE CONTROL) LIQD 1 each by In Vitro route as directed.    Blood Glucose Monitoring Suppl (ACCU-CHEK GUIDE ME) w/Device KIT 1 each by Does not apply route every 14 (fourteen) days. Use to check FBS and 2 HR PP once every 2 weeks   furosemide (LASIX) 20 MG tablet Take 1 tablet (20 mg) by mouth once every other day   glipiZIDE (GLUCOTROL XL) 10 MG 24 hr tablet TAKE 1 TABLET(10 MG) BY MOUTH DAILY WITH BREAKFAST   glucose blood (ACCU-CHEK GUIDE) test strip USE TO CHECK A FASTING BLOOD SUGAR AND A 2 HOUR POSTPRANDIAL DAILY   hydrochlorothiazide (HYDRODIURIL) 25 MG tablet Take 25 mg by mouth daily.   losartan (COZAAR) 100 MG tablet TAKE 1 TABLET(100 MG) BY MOUTH DAILY   metFORMIN (GLUCOPHAGE-XR) 500 MG 24 hr tablet TAKE 3 TABLETS(1500 MG) BY MOUTH DAILY WITH BREAKFAST   metoprolol tartrate (LOPRESSOR) 100 MG tablet TAKE 1 TABLET(100 MG) BY MOUTH TWICE DAILY   mometasone (NASONEX) 50 MCG/ACT nasal spray SHAKE LIQUID AND USE 2 SPRAYS IN EACH NOSTRIL DAILY AS NEEDED   montelukast (SINGULAIR) 10 MG tablet Take 1 tablet (10 mg total) by mouth at bedtime as needed.   pioglitazone (ACTOS) 30 MG tablet TAKE 1 TABLET(30 MG) BY MOUTH DAILY   triamcinolone cream (KENALOG) 0.1 % APPLY EXTERNALLY TO THE AFFECTED AREA TWICE DAILY   No facility-administered encounter medications on file as of 02/09/2022.    Allergies (verified) Bee venom,  Fish allergy, Penicillins, Amiodarone, Iodine, Ace inhibitors, Januvia [sitagliptin], Lidocaine hcl, and Lipitor [atorvastatin]   History: Past Medical History:  Diagnosis Date   Acid reflux disease    Asthma    a. Remotely.   Chronic combined systolic and diastolic CHF (congestive heart failure) ([place])    a. Felt related to AF. EF 45-50% by echo 12/2012.   CVA (cerebral vascular accident) ([place])    a. Thromboemoblic L MCA CVA 45/8099 with no residual deficit except mild occasional word finding. b. 8-33% RICA/LICA by dopplers 82/5/05.   GERD (gastroesophageal reflux disease)    H/O medication noncompliance    a. Per PCP note  01/06/13: Previously refused anything greater than aspirin, refused treatment for DM, and often stopped hypertensive meds and lipid meds.   Hx of rheumatic fever    a. As a child.   Hyperlipidemia    Hypertension    Kidney stones    a. Remotely.   PAF (paroxysmal atrial fibrillation) ([place])    a. Dx 09/2012, chronicity unclear at that time. Initially refused Xarelto due to risk of bleeding. b. Sustained CVA 12/2012 - agreeable to taking Eliquis. Tachybrady syndrome during that admission s/p Medtronic PPM implantation;  c. Failed flecainide, sotalol, amiodarone (itching), and propafenone.   Tachy-brady syndrome ([place])    a. AF RVR with pauses >5sec requiring Medtronic PPM 12/2012.   Tubular adenoma of colon 05/2008   Type 2 diabetes mellitus ([place])    Past Surgical History:  Procedure Laterality Date   CHOLECYSTECTOMY     DESTRUCTION TRIGEMINAL NERVE VIA NEUROLYTIC AGENT  NOv '@013'$    Gamma Knife    ENDOMETRIAL ABLATION     INSERT / REPLACE / REMOVE PACEMAKER     PACEMAKER INSERTION  12-30-12   MDT dual chamber pacemaker implanted by Dr Caryl Comes for tachy-brady syndrome   PERMANENT PACEMAKER INSERTION N/A 12/30/2012   Procedure: PERMANENT PACEMAKER INSERTION;  Surgeon: Deboraha Sprang, MD;  Location: [facility] CATH LAB;  Service: Cardiovascular;  Laterality: N/A;   TUBAL LIGATION     ectopic preganacy    Family History  Problem Relation Age of Onset   Stroke Mother    Diabetes Mother    Hypertension Mother    Social History   Socioeconomic History   Marital status: Married    Spouse name: Not on file   Number of children: 2   Years of education: Not on file   Highest education level: Not on file  Occupational History   Occupation: cosmetologist    Employer: SELF EMPLOYED  Tobacco Use   Smoking status: Former    Packs/day: 2.00    Types: Cigarettes    Quit date: 01/24/1968    Years since quitting: 54.0   Smokeless tobacco: Never  Vaping Use   Vaping Use: Never used  Substance and Sexual  Activity   Alcohol use: Yes    Alcohol/week: 1.0 standard drink of alcohol    Types: 1 Glasses of wine per week    Comment: rare use   Drug use: No   Sexual activity: Yes    Birth control/protection: Post-menopausal  Other Topics Concern   Not on file  Social History Narrative   Lives in [place] with her husband.  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   Social Determinants of Health   Financial Resource Strain: Low Risk  (02/09/2022)   Overall Financial Resource Strain (CARDIA)    Difficulty of Paying Living Expenses: Not hard at all  Food Insecurity: No  Food Insecurity (02/09/2022)   Hunger Vital Sign    Worried About Running Out of Food in the Last Year: Never true    Ran Out of Food in the Last Year: Never true  Transportation Needs: No Transportation Needs (02/09/2022)   PRAPARE - Hydrologist (Medical): No    Lack of Transportation (Non-Medical): No  Physical Activity: Sufficiently Active (02/09/2022)   Exercise Vital Sign    Days of Exercise per Week: 5 days    Minutes of Exercise per Session: 30 min  Stress: No Stress Concern Present (02/09/2022)   [place]    Feeling of Stress : Not at all  Social Connections: Moderately Isolated (02/09/2022)   Social Connection and Isolation Panel [NHANES]    Frequency of Communication with Friends and Family: More than three times a week    Frequency of Social Gatherings with Friends and Family: More than three times a week    Attends Religious Services: Never    Marine scientist or Organizations: No    Attends Music therapist: Never    Marital Status: Married    Tobacco Counseling Counseling given: Not Answered   Clinical Intake:  Pre-visit preparation completed: Yes        Diabetes: Yes (Followed by pcp)  How often do you need to have someone help you when you read instructions, pamphlets, or other  written materials from your doctor or pharmacy?: 1 - Never    Interpreter Needed?: No      Activities of Daily Living    02/09/2022    9:10 AM  In your present state of health, do you have any difficulty performing the following activities:  Hearing? 0  Vision? 0  Difficulty concentrating or making decisions? 0  Walking or climbing stairs? 0  Dressing or bathing? 0  Doing errands, shopping? 0  Preparing Food and eating ? N  Using the Toilet? N  In the past six months, have you accidently leaked urine? N  Do you have problems with loss of bowel control? N  Managing your Medications? N  Managing your Finances? N  Housekeeping or managing your Housekeeping? N    Patient Care Team: [name], MD as PCP - General Debbora Dus, [facility] as Pharmacist (Pharmacist)  Indicate any recent Medical Services you may have received from other than [facility] providers in the past year (date may be approximate).     Assessment:   This is a routine wellness examination for Nina Carpenter.  I connected with  Nobie Putnam on 02/09/22 by a audio enabled telemedicine application and verified that I am speaking with the correct person using two identifiers.  Patient Location: Home  Provider Location: Office/Clinic  I discussed the limitations of evaluation and management by telemedicine. The patient expressed understanding and agreed to proceed.   Hearing/Vision screen Hearing Screening - Comments:: Patient is able to hear conversational tones without difficulty.  No issues reported.   Vision Screening - Comments::  Last exam 2023, [employer], wears glasses  Dietary issues and exercise activities discussed: Current Exercise Habits: Home exercise routine, Type of exercise: walking, Time (Minutes): 30, Frequency (Times/Week): 5, Weekly Exercise (Minutes/Week): 150, Intensity: Mild Healthy diet Good water intake   Goals Addressed             This Visit's Progress    Maintain  healthy lifestyle       Stay active Healthy diet  Depression Screen    02/09/2022    9:14 AM 02/08/2021    9:52 AM 01/03/2021   10:51 AM 01/05/2020    9:27 AM 07/15/2018   11:07 AM 04/16/2017    3:38 PM 03/20/2016    2:51 PM  PHQ 2/9 Scores  PHQ - 2 Score 0 0 0 0 0 0 0  PHQ- 9 Score     0 0     Fall Risk    02/09/2022    9:10 AM 02/08/2021    9:51 AM 01/03/2021   10:51 AM 01/05/2020    9:28 AM 07/15/2018   11:07 AM  Fall Risk   Falls in the past year? 0 0 0 1 0  Number falls in past yr: 0 0  0   Injury with Fall? 0 0  0   Risk for fall due to :  No Fall Risks  No Fall Risks   Follow up Falls evaluation completed;Falls prevention discussed Falls prevention discussed  Falls evaluation completed     FALL RISK PREVENTION PERTAINING TO THE HOME: Home free of loose throw rugs in walkways, pet beds, electrical cords, etc? Yes  Adequate lighting in your home to reduce risk of falls? Yes   ASSISTIVE DEVICES UTILIZED TO PREVENT FALLS: Life alert? No  Use of a cane, walker or w/c? No   TIMED UP AND GO: Was the test performed? No .   Cognitive Function:    07/15/2018   11:06 AM 04/16/2017   10:54 AM  MMSE - Mini Mental State Exam  Orientation to time 5 5  Orientation to Place 5 5  Registration 3 3  Attention/ Calculation 0 0  Recall 3 3  Language- name 2 objects 0 0  Language- repeat 1 1  Language- follow 3 step command 0 3  Language- read & follow direction 0 0  Write a sentence 0 0  Copy design 0 0  Total score 17 20        Immunizations Immunization History  Administered Date(s) Administered   Fluad Quad(high Dose 65+) 01/05/2020, 01/03/2021   Influenza Split 11/13/2011   Influenza, High Dose Seasonal PF 11/23/2016   Influenza,inj,Quad PF,6+ Mos 11/18/2012, 10/06/2013, 12/23/2014, 03/20/2016, 01/28/2018   PFIZER(Purple Top)SARS-COV-2 Vaccination 03/07/2019, 04/01/2019   Pneumococcal Conjugate-13 10/06/2013   Pneumococcal Polysaccharide-23 10/11/2010    TDAP status: Due, Education has been provided regarding the importance of this vaccine. Advised may receive this vaccine at local pharmacy or Health Dept. Aware to provide a copy of the vaccination record if obtained from local pharmacy or Health Dept. Verbalized acceptance and understanding.  Shingrix Completed?: No.    Education has been provided regarding the importance of this vaccine. Patient has been advised to call insurance company to determine out of pocket expense if they have not yet received this vaccine. Advised may also receive vaccine at local pharmacy or Health Dept. Verbalized acceptance and understanding.  Screening Tests Health Maintenance  Topic Date Due   DTaP/Tdap/Td (1 - Tdap) Never done   COVID-19 Vaccine (3 - Pfizer risk series) 02/25/2022 (Originally 04/29/2019)   INFLUENZA VACCINE  04/23/2022 (Originally 08/23/2021)   Zoster Vaccines- Shingrix (1 of 2) 05/11/2022 (Originally 07/27/1964)   DEXA SCAN  06/24/2022 (Originally 07/28/2010)   HEMOGLOBIN A1C  07/13/2022   Diabetic kidney evaluation - eGFR measurement  01/12/2023   Diabetic kidney evaluation - Urine ACR  01/12/2023   FOOT EXAM  01/19/2023   OPHTHALMOLOGY EXAM  01/19/2023   Medicare Annual Wellness (AWV)  02/10/2023   Pneumonia Vaccine 19+ Years old  Completed   Hepatitis C Screening  Completed   HPV VACCINES  Aged Out   COLONOSCOPY (Pts 45-53yr Insurance coverage will need to be confirmed)  Discontinued   Fecal DNA (Cologuard)  Discontinued    Health Maintenance Health Maintenance Due  Topic Date Due   DTaP/Tdap/Td (1 - Tdap) Never done   Mammogram- deferred per patient.   Bone density- deferred per patient.   Lung Cancer Screening: (Low Dose CT Chest recommended if Age 227-80years, 30 pack-year currently smoking OR have quit w/in 15years.) does not qualify.   Hepatitis C Screening: Completed 02/2016.  Vision Screening: Recommended annual ophthalmology exams for early detection of glaucoma and  other disorders of the eye.  Dental Screening: Recommended annual dental exams for proper oral hygiene  Community Resource Referral / Chronic Care Management: CRR required this visit?  No   CCM required this visit?  No      Plan:     I have personally reviewed and noted the following in the patient's chart:   Medical and social history Use of alcohol, tobacco or illicit drugs  Current medications and supplements including opioid prescriptions. Patient is not currently taking opioid prescriptions. Functional ability and status Nutritional status Physical activity Advanced directives List of other physicians Hospitalizations, surgeries, and ER visits in previous 12 months Vitals Screenings to include cognitive, depression, and falls Referrals and appointments  In addition, I have reviewed and discussed with patient certain preventive protocols, quality metrics, and best practice recommendations. A written personalized care plan for preventive services as well as general preventive health recommendations were provided to patient.     DLeta Jungling LPN   11/58/6825

## 2022-02-10 ENCOUNTER — Ambulatory Visit (INDEPENDENT_AMBULATORY_CARE_PROVIDER_SITE_OTHER): Payer: Medicare Other | Admitting: Family Medicine

## 2022-02-10 ENCOUNTER — Encounter: Payer: Self-pay | Admitting: Family Medicine

## 2022-02-10 VITALS — BP 142/64 | HR 90 | Temp 98.0°F | Resp 16 | Ht 61.75 in | Wt 201.0 lb

## 2022-02-10 DIAGNOSIS — L239 Allergic contact dermatitis, unspecified cause: Secondary | ICD-10-CM

## 2022-02-10 MED ORDER — TRIAMCINOLONE ACETONIDE 0.5 % EX CREA: 1.0000 | TOPICAL_CREAM | Freq: Two times a day (BID) | CUTANEOUS | 0 refills | Status: DC

## 2022-02-10 NOTE — Telephone Encounter (Signed)
Nina Carpenter - Client TELEPHONE ADVICE RECORD Access[facility]urse Patient Name: Nina Carpenter Gender: Female DOB: 11/27/1945 Age: 77 Y 60 M 13 D Return Phone Number: [phone] (Primary), [phone] (Secondary) [place]  [address] Client [name] - Client Client Site [facility] Provider [name] - MD Contact Type Call Who Is Calling Patient / Member / Family / Caregiver Call Type Triage / Clinical Caller Name [name] Relationship To Patient Self Return Phone Number [phone] (Secondary) Chief Complaint Itching Reason for Call Symptomatic / Request for Nina Carpenter states she is having an allergic reaction. She has itchy and hot skin all over her body Translation No Nurse Assessment Nurse: Eugenio Hoes, RN, Jenny Reichmann Date/Time (Eastern Time): 02/09/2022 4:58:11 PM Confirm and document reason for call. If symptomatic, describe symptoms. ---Caller states that she is having hives and itching. Caller states that she is not sure what may have caused it. Caller states that she is taking metformin, eliquis, losartan, and metoprolol. Does the patient have any new or worsening symptoms? ---Yes Will a triage be completed? ---Yes Related visit to physician within the last 2 weeks? ---No Does the PT have any chronic conditions? (i.e. diabetes, asthma, this includes High risk factors for pregnancy, etc.) ---Yes List chronic conditions. ---DM, HTN, stroke (10 years ago) Is this a behavioral health or substance abuse call? ---No Guidelines Guideline Title Affirmed Question Affirmed Notes Nurse Date/Time (Eastern Time) Hives Hives persist > 1 week Lynett Fish 02/09/2022 5:01:44 PM Disp. Time Eilene Ghazi Time) Disposition Final User 02/09/2022 5:11:22 PM SEE PCP WITHIN 3 DAYS Yes Eugenio Hoes RN, Jenny Reichmann Final Disposition 02/09/2022  5:11:22 PM SEE PCP WITHIN 3 DAYS Yes Eugenio Hoes, RN, Jenny Reichmann PLEASE NOTE: All timestamps contained within this report are represented as [place] Standard Time. CONFIDENTIALTY NOTICE: This fax transmission is intended only for the addressee. It contains information that is legally privileged, confidential or otherwise protected from use or disclosure. If you are not the intended recipient, you are strictly prohibited from reviewing, disclosing, copying using or disseminating any of this information or taking any action in reliance on or regarding this information. If you have received this fax in error, please notify us immediately by telephone so that we can arrange for its return to [name]. Phone: [phone], Toll-Free: [phone], Fax: [phone] Page: 2 of 2 Call Id: [id number] [place] Disagree/Comply Comply Caller Understands Yes PreDisposition Did not know what to do Care Advice Given Per Guideline TAKE A PHOTO OF THE RASH: * If you have a digital or cell phone camera, take a picture of the rash in case it changes or goes away. * Bring it with you to your doctor's appointment. CALL BACK IF: * Severe hives or severe itching persist over 24 hours despite taking an antihistamine (e.g., Benadryl) * You become worse CARE ADVICE given per Hives (Adult) guideline. Comments User: Baird Cancer, RN Date/Time Eilene Ghazi Time): 02/09/2022 5:01:26 PM pacemaker 10 years Referrals REFERRED TO PCP OFFIC

## 2022-02-10 NOTE — Telephone Encounter (Signed)
I spoke with pt's husband; she took allegra last night; have hives on upper body; no swelling in throat and no difficulty in breathing. Pt was able to rest last night and still sleeping this morning.pts husband scheduled appt with Dr Diona Browner 02/10/22 at 11:20 (first available appt that suited pt). UC & ED precautions given to pts husband and he voiced understanding. Sending note to Dr Diona Browner and [place] [facility].

## 2022-02-10 NOTE — Patient Instructions (Addendum)
Continue antihistamine.  Apply topical steroid cream twice daily.  Apply topical daily moisturizing cream.. Cetaphil cream.  Avoid super hot showers.  Call if not improving as expected for possible oral steroid course. 

## 2022-02-10 NOTE — Progress Notes (Signed)
Patient ID: Nina Carpenter, female    DOB: 04/30/1945, 77 y.o.   MRN: [id number]  This visit was conducted in person.  BP (!) 142/64   Pulse 90   Temp 98 F (36.7 C)   Resp 16   Ht 5' 1.75" (1.568 m)   Wt 201 lb (91.2 kg)   SpO2 98%   BMI 37.06 kg/m    CC:  Chief Complaint  Patient presents with   Urticaria    Since Christmas been itching    Subjective:   HPI: Nina Carpenter is a 77 y.o. female  patient of Dr. Lillie Fragmin presenting on 02/10/2022 for Urticaria (Since Christmas been itching)  New onset rash and itching on left arm and upper back. Noted after 01/16/2022  Very itchy.  No fever, no N/V, no viral symptoms.  No new exposure, no new lotion or  detergent, soaps.   Son has new dog in last 6 months.  No new med changes.  Start allergy med.. helped with itching.   Has been applying cortisone.  No oral swelling, no SOB, no wheeze.     Relevant past medical, surgical, family and social history reviewed and updated as indicated. Interim medical history since our last visit reviewed. Allergies and medications reviewed and updated. Outpatient Medications Prior to Visit  Medication Sig Dispense Refill   Accu-Chek FastClix Lancets MISC USE TO CHECK BLOOD SUGAR ONCE EVERY 2 WEEKS 102 each 0   albuterol (VENTOLIN HFA) 108 (90 Base) MCG/ACT inhaler Inhale 2 puffs into the lungs every 6 (six) hours as needed for wheezing or shortness of breath. 8 g 2   apixaban (ELIQUIS) 5 MG TABS tablet Take 1 tablet (5 mg total) by mouth 2 (two) times daily. 60 tablet 3   Blood Glucose Calibration (ACCU-CHEK GUIDE CONTROL) LIQD 1 each by In Vitro route as directed. 1 each 0   Blood Glucose Monitoring Suppl (ACCU-CHEK GUIDE ME) w/Device KIT 1 each by Does not apply route every 14 (fourteen) days. Use to check FBS and 2 HR PP once every 2 weeks 1 kit 0   furosemide (LASIX) 20 MG tablet Take 1 tablet (20 mg) by mouth once every other day 45 tablet 3   glipiZIDE (GLUCOTROL XL) 10 MG  24 hr tablet TAKE 1 TABLET(10 MG) BY MOUTH DAILY WITH BREAKFAST 90 tablet 1   hydrochlorothiazide (HYDRODIURIL) 25 MG tablet Take 25 mg by mouth daily.     losartan (COZAAR) 100 MG tablet TAKE 1 TABLET(100 MG) BY MOUTH DAILY 90 tablet 3   metFORMIN (GLUCOPHAGE-XR) 500 MG 24 hr tablet TAKE 3 TABLETS(1500 MG) BY MOUTH DAILY WITH BREAKFAST 90 tablet 5   mometasone (NASONEX) 50 MCG/ACT nasal spray SHAKE LIQUID AND USE 2 SPRAYS IN EACH NOSTRIL DAILY AS NEEDED 51 g 0   montelukast (SINGULAIR) 10 MG tablet Take 1 tablet (10 mg total) by mouth at bedtime as needed. 90 tablet 3   pioglitazone (ACTOS) 30 MG tablet TAKE 1 TABLET(30 MG) BY MOUTH DAILY 90 tablet 1   glucose blood (ACCU-CHEK GUIDE) test strip USE TO CHECK A FASTING BLOOD SUGAR AND A 2 HOUR POSTPRANDIAL DAILY 200 strip 3   metoprolol tartrate (LOPRESSOR) 100 MG tablet TAKE 1 TABLET(100 MG) BY MOUTH TWICE DAILY 180 tablet 0   triamcinolone cream (KENALOG) 0.1 % APPLY EXTERNALLY TO THE AFFECTED AREA TWICE DAILY 454 g 1   No facility-administered medications prior to visit.     Per HPI unless specifically indicated in ROS  section below Review of Systems  Constitutional:  Negative for fatigue and fever.  HENT:  Negative for congestion.   Eyes:  Negative for pain.  Respiratory:  Negative for cough and shortness of breath.   Cardiovascular:  Negative for chest pain, palpitations and leg swelling.  Gastrointestinal:  Negative for abdominal pain.  Genitourinary:  Negative for dysuria and vaginal bleeding.  Musculoskeletal:  Negative for back pain.  Skin:  Positive for rash.  Neurological:  Negative for syncope, light-headedness and headaches.  Psychiatric/Behavioral:  Negative for dysphoric mood.    Objective:  BP (!) 142/64   Pulse 90   Temp 98 F (36.7 C)   Resp 16   Ht 5' 1.75" (1.568 m)   Wt 201 lb (91.2 kg)   SpO2 98%   BMI 37.06 kg/m   Wt Readings from Last 3 Encounters:  02/28/22 203 lb (92.1 kg)  02/10/22 201 lb (91.2 kg)   02/09/22 201 lb (91.2 kg)      Physical Exam Constitutional:      General: She is not in acute distress.    Appearance: Normal appearance. She is well-developed. She is not ill-appearing or toxic-appearing.  HENT:     Head: Normocephalic.     Right Ear: Hearing, tympanic membrane, ear canal and external ear normal. Tympanic membrane is not erythematous, retracted or bulging.     Left Ear: Hearing, tympanic membrane, ear canal and external ear normal. Tympanic membrane is not erythematous, retracted or bulging.     Nose: No mucosal edema or rhinorrhea.     Right Sinus: No maxillary sinus tenderness or frontal sinus tenderness.     Left Sinus: No maxillary sinus tenderness or frontal sinus tenderness.     Mouth/Throat:     Pharynx: Uvula midline.  Eyes:     General: Lids are normal. Lids are everted, no foreign bodies appreciated.     Conjunctiva/sclera: Conjunctivae normal.     Pupils: Pupils are equal, round, and reactive to light.  Neck:     Thyroid: No thyroid mass or thyromegaly.     Vascular: No carotid bruit.     Trachea: Trachea normal.  Cardiovascular:     Rate and Rhythm: Normal rate and regular rhythm.     Pulses: Normal pulses.     Heart sounds: Normal heart sounds, S1 normal and S2 normal. No murmur heard.    No friction rub. No gallop.  Pulmonary:     Effort: Pulmonary effort is normal. No tachypnea or respiratory distress.     Breath sounds: Normal breath sounds. No decreased breath sounds, wheezing, rhonchi or rales.  Abdominal:     General: Bowel sounds are normal.     Palpations: Abdomen is soft.     Tenderness: There is no abdominal tenderness.  Musculoskeletal:     Cervical back: Normal range of motion and neck supple.  Skin:    General: Skin is warm and dry.     Findings: No rash.     Comments: Erythematous patch on left arm and upper back  Neurological:     Mental Status: She is alert.  Psychiatric:        Mood and Affect: Mood is not anxious or  depressed.        Speech: Speech normal.        Behavior: Behavior normal. Behavior is cooperative.        Thought Content: Thought content normal.        Judgment: Judgment normal.  Results for orders placed or performed in visit on 02/02/22  CUP PACEART REMOTE DEVICE CHECK  Result Value Ref Range   Date Time Interrogation Session [phone]    Pulse Generator Manufacturer MERM    Pulse Gen Model [id number] Advisa DR MRI    Pulse Gen Serial Number [id number]    Clinic Name [facility] Pulse Generator Type Implantable Pulse Generator    Implantable Pulse Generator Implant Date [id number]    Implantable Lead Manufacturer MERM    Implantable Lead Model 5076 CapSureFix Novus    Implantable Lead Serial Number [id number]    Implantable Lead Implant Date [id number]    Implantable Lead Location Detail 1 APPENDAGE    Implantable Lead Location [id number]    Implantable Lead Connection Status [id number]    Implantable Lead Manufacturer MERM    Implantable Lead Model 5076 CapSureFix Novus    Implantable Lead Serial Number [id number]    Implantable Lead Implant Date [id number]    Implantable Lead Location Detail 1 APEX    Implantable Lead Location [id number]    Implantable Lead Connection Status [id number]    Lead Channel Setting Sensing Sensitivity 2 mV   Lead Channel Setting Pacing Amplitude 2 V   Lead Channel Setting Pacing Pulse Width 0.4 ms   Lead Channel Setting Pacing Amplitude 2.5 V   Zone Setting Status [id number]    Zone Setting Status [phone]    Lead Channel Impedance Value 361 ohm   Lead Channel Impedance Value 323 ohm   Lead Channel Sensing Intrinsic Amplitude 2.25 mV   Lead Channel Sensing Intrinsic Amplitude 2.25 mV   Lead Channel Pacing Threshold Amplitude 0.875 V   Lead Channel Pacing Threshold Pulse Width 0.4 ms   Lead Channel Impedance Value 418 ohm   Lead Channel Impedance Value 380 ohm   Lead Channel Sensing Intrinsic Amplitude 8.625 mV   Lead Channel Sensing  Intrinsic Amplitude 8.625 mV   Lead Channel Pacing Threshold Amplitude 0.625 V   Lead Channel Pacing Threshold Pulse Width 0.4 ms   Battery Status OK    Battery Remaining Longevity 20 mo   Battery Voltage 2.91 V   Brady Statistic RA Percent Paced 44.98 %   Brady Statistic RV Percent Paced 0.04 %   Brady Statistic AP VP Percent 0.03 %   Brady Statistic AS VP Percent 0.01 %   Brady Statistic AP VS Percent 45.12 %   Brady Statistic AS VS Percent 54.85 %    Assessment and Plan  Allergic dermatitis Assessment & Plan:  Continue antihistamine.  Apply topical steroid cream twice daily.  Apply topical daily moisturizing cream.. Cetaphil cream.  Avoid super hot showers.  Call if not improving as expected for possible oral steroid course.     No follow-ups on file.   Eliezer Lofts, MD

## 2022-02-10 NOTE — Telephone Encounter (Signed)
Patient seen today in office. 

## 2022-02-14 ENCOUNTER — Other Ambulatory Visit: Payer: Self-pay | Admitting: Family Medicine

## 2022-02-14 NOTE — Telephone Encounter (Signed)
Last office visit 02/10/2022 for Allergic Dermatitis with Dr. Diona Browner.   Last refilled 02/10/22 for 30 g with no refills. Next Appt: 07/20/22 for 6 month follow up with Dr. Lorelei Pont.

## 2022-02-16 ENCOUNTER — Telehealth: Payer: Self-pay | Admitting: Family Medicine

## 2022-02-16 DIAGNOSIS — E1159 Type 2 diabetes mellitus with other circulatory complications: Secondary | ICD-10-CM

## 2022-02-16 MED ORDER — ACCU-CHEK GUIDE VI STRP: ORAL_STRIP | 3 refills | Status: DC

## 2022-02-16 NOTE — Telephone Encounter (Signed)
Prescription Request  02/16/2022  Is this a "Controlled Substance" medicine? No  LOV: 01/18/2022  What is the name of the medication or equipment? glucose blood (ACCU-CHEK GUIDE) test strip   Have you contacted your pharmacy to request a refill? Yes   Which pharmacy would you like this sent to?   [id number] - [place], [place] - [address] [place] [address] Phone: [phone] Fax: [phone]      Patient notified that their request is being sent to the clinical staff for review and that they should receive a response within 2 business days.   Please advise at Mobile [phone] (mobile)

## 2022-02-16 NOTE — Telephone Encounter (Signed)
Spoke with [facility] and advised that she has refills at her pharmacy but the pharmacy is saying that her insurance isn't covering them.  She states she is only checking her BS once daily so I updated her Rx to once a day testing and resent Rx to see if insurance will cover.

## 2022-02-16 NOTE — Telephone Encounter (Signed)
Patient called and stated she wanted a phone call from [facility] so they can talk about medication to get her test strips. Call back number [phone].

## 2022-02-16 NOTE — Telephone Encounter (Signed)
Patient states she went to pharmacy to get test strips and the pharmacist is telling her we need to send this to her insurance.  She states she has [employer].  I ask that she bring me a copy of her insurance card so we can update her coverage and then I would contact her insurance to see what brand of diabetic testing supplies they cover.

## 2022-02-22 NOTE — Progress Notes (Signed)
Remote pacemaker transmission.   

## 2022-02-28 ENCOUNTER — Ambulatory Visit: Payer: Medicare Other | Attending: Internal Medicine | Admitting: Internal Medicine

## 2022-02-28 ENCOUNTER — Telehealth: Payer: Self-pay | Admitting: Internal Medicine

## 2022-02-28 ENCOUNTER — Encounter: Payer: Self-pay | Admitting: Internal Medicine

## 2022-02-28 ENCOUNTER — Other Ambulatory Visit: Payer: Medicare Other

## 2022-02-28 VITALS — BP 165/90 | HR 72 | Ht 62.5 in | Wt 203.0 lb

## 2022-02-28 DIAGNOSIS — I480 Paroxysmal atrial fibrillation: Secondary | ICD-10-CM | POA: Insufficient documentation

## 2022-02-28 DIAGNOSIS — I495 Sick sinus syndrome: Secondary | ICD-10-CM

## 2022-02-28 DIAGNOSIS — I5032 Chronic diastolic (congestive) heart failure: Secondary | ICD-10-CM | POA: Diagnosis not present

## 2022-02-28 DIAGNOSIS — Z950 Presence of cardiac pacemaker: Secondary | ICD-10-CM

## 2022-02-28 LAB — PACEMAKER DEVICE OBSERVATION

## 2022-02-28 MED ORDER — LABETALOL HCL 200 MG PO TABS: 200.0000 mg | ORAL_TABLET | Freq: Two times a day (BID) | ORAL | 3 refills | Status: DC

## 2022-02-28 NOTE — Patient Instructions (Signed)
Medication Instructions:  Your physician has recommended you make the following change in your medication:  STOP - metoprolol tartrate (LOPRESSOR) 100 MG tablet  START - labetalol (NORMODYNE) 200 MG tablet  Take 1 tablet (200 mg total) by mouth 2 (two) times daily.   *If you need a refill on your cardiac medications before your next appointment, please call your pharmacy*   Lab Work: -None ordered If you have labs (blood work) drawn today and your tests are completely normal, you will receive your results only by: [place] (if you have MyChart) OR A paper copy in the mail If you have any lab test that is abnormal or we need to change your treatment, we will call you to review the results.   Testing/Procedures: -None ordered   Follow-Up: At [facility], you and your health needs are our priority.  As part of our continuing mission to provide you with exceptional heart care, we have created designated Provider Care Teams.  These Care Teams include your primary Cardiologist (physician) and Advanced Practice Providers (APPs -  Physician Assistants and Nurse Practitioners) who all work together to provide you with the care you need, when you need it.  We recommend signing up for the patient portal called "MyChart".  Sign up information is provided on this After Visit Summary.  MyChart is used to connect with patients for Virtual Visits (Telemedicine).  Patients are able to view lab/test results, encounter notes, upcoming appointments, etc.  Non-urgent messages can be sent to your provider as well.   To learn more about what you can do with MyChart, go to [email or web address].    Your next appointment:   1 year(s)  Provider:   Virl Axe, MD    Other Instructions -None

## 2022-02-28 NOTE — Telephone Encounter (Signed)
Calling back to give her bp reading since she left our office  152/74 134/69

## 2022-02-28 NOTE — Progress Notes (Signed)
Patient Care Team: [name], MD as PCP - General Debbora Dus, [facility] as Pharmacist (Pharmacist)   HPI  Nina Carpenter is a 77 y.o. female Seen in follow-up for atrial fibrillation and tachybradysyndrome for which she underwent pacing 2014 Medtronic . PVI at [facility] 2015 There is been a marked diminution in atrial fibrillation symptoms and by device interrogation burden as well. She does have some nonsustained atrial tachycardia/slow flutter   On Apixoban  without bleeding    The patient denies chest pain, nocturnal dyspnea, orthopnea.  There have been no palpitations, lightheadedness or syncope.  Complains of chronic dyspnea on exertion without any changes.  Also intermittent edema which she treats by raising her feet  Blood pressures at home typically in the 140s range or so.    DATE TEST EF   2/14 Echo   45-50 %   8/21 Echo  55-60%     Date Cr K Hgb A1c  3/19 0.83 4.6 14.6   7/20 0.99 4.7 14.3 8.0  7/21    6.4  12/21 1.32 4.0 12.4 6.0 (6/22)   12/23 1.19 4.8 16.1     Thromboembolic risk factors ( age -38, HTN-1, TIA/CVA-2, DM-1, Gender-1) for a CHADSVASc Score >=7   Past Medical History:  Diagnosis Date   Acid reflux disease    Asthma    a. Remotely.   Chronic combined systolic and diastolic CHF (congestive heart failure) ([place])    a. Felt related to AF. EF 45-50% by echo 12/2012.   CVA (cerebral vascular accident) ([place])    a. Thromboemoblic L MCA CVA 09/6043 with no residual deficit except mild occasional word finding. b. 4-09% RICA/LICA by dopplers 81/1/91.   GERD (gastroesophageal reflux disease)    H/O medication noncompliance    a. Per PCP note 01/06/13: Previously refused anything greater than aspirin, refused treatment for DM, and often stopped hypertensive meds and lipid meds.   Hx of rheumatic fever    a. As a child.   Hyperlipidemia    Hypertension    Kidney stones    a. Remotely.   PAF (paroxysmal atrial fibrillation) ([place])    a. Dx  09/2012, chronicity unclear at that time. Initially refused Xarelto due to risk of bleeding. b. Sustained CVA 12/2012 - agreeable to taking Eliquis. Tachybrady syndrome during that admission s/p Medtronic PPM implantation;  c. Failed flecainide, sotalol, amiodarone (itching), and propafenone.   Tachy-brady syndrome ([place])    a. AF RVR with pauses >5sec requiring Medtronic PPM 12/2012.   Tubular adenoma of colon 05/2008   Type 2 diabetes mellitus ([place])     Past Surgical History:  Procedure Laterality Date   CHOLECYSTECTOMY     DESTRUCTION TRIGEMINAL NERVE VIA NEUROLYTIC AGENT  NOv '@013'$    Gamma Knife    ENDOMETRIAL ABLATION     INSERT / REPLACE / REMOVE PACEMAKER     PACEMAKER INSERTION  12-30-12   MDT dual chamber pacemaker implanted by Dr Caryl Comes for tachy-brady syndrome   PERMANENT PACEMAKER INSERTION N/A 12/30/2012   Procedure: PERMANENT PACEMAKER INSERTION;  Surgeon: Deboraha Sprang, MD;  Location: [facility] CATH LAB;  Service: Cardiovascular;  Laterality: N/A;   TUBAL LIGATION     ectopic preganacy     Current Outpatient Medications  Medication Sig Dispense Refill   Accu-Chek FastClix Lancets MISC USE TO CHECK BLOOD SUGAR ONCE EVERY 2 WEEKS 102 each 0   albuterol (VENTOLIN HFA) 108 (90 Base) MCG/ACT inhaler Inhale 2 puffs into  the lungs every 6 (six) hours as needed for wheezing or shortness of breath. 8 g 2   apixaban (ELIQUIS) 5 MG TABS tablet Take 1 tablet (5 mg total) by mouth 2 (two) times daily. 60 tablet 3   Blood Glucose Calibration (ACCU-CHEK GUIDE CONTROL) LIQD 1 each by In Vitro route as directed. 1 each 0   Blood Glucose Monitoring Suppl (ACCU-CHEK GUIDE ME) w/Device KIT 1 each by Does not apply route every 14 (fourteen) days. Use to check FBS and 2 HR PP once every 2 weeks 1 kit 0   furosemide (LASIX) 20 MG tablet Take 1 tablet (20 mg) by mouth once every other day 45 tablet 3   glipiZIDE (GLUCOTROL XL) 10 MG 24 hr tablet TAKE 1 TABLET(10 MG) BY MOUTH DAILY WITH BREAKFAST 90 tablet 1    glucose blood (ACCU-CHEK GUIDE) test strip USE TO CHECK A FASTING BLOOD SUGAR ONCE DAILY 100 strip 3   hydrochlorothiazide (HYDRODIURIL) 25 MG tablet Take 25 mg by mouth daily.     losartan (COZAAR) 100 MG tablet TAKE 1 TABLET(100 MG) BY MOUTH DAILY 90 tablet 3   metFORMIN (GLUCOPHAGE-XR) 500 MG 24 hr tablet TAKE 3 TABLETS(1500 MG) BY MOUTH DAILY WITH BREAKFAST 90 tablet 5   metoprolol tartrate (LOPRESSOR) 100 MG tablet TAKE 1 TABLET(100 MG) BY MOUTH TWICE DAILY 180 tablet 1   mometasone (NASONEX) 50 MCG/ACT nasal spray SHAKE LIQUID AND USE 2 SPRAYS IN EACH NOSTRIL DAILY AS NEEDED 51 g 0   montelukast (SINGULAIR) 10 MG tablet Take 1 tablet (10 mg total) by mouth at bedtime as needed. 90 tablet 3   pioglitazone (ACTOS) 30 MG tablet TAKE 1 TABLET(30 MG) BY MOUTH DAILY 90 tablet 1   triamcinolone cream (KENALOG) 0.5 % APPLY TOPICALLY TO THE AFFECTED AREA TWICE DAILY 30 g 0   No current facility-administered medications for this visit.    Allergies  Allergen Reactions   Bee Venom Anaphylaxis   Fish Allergy Swelling    Shell fish   Penicillins Hives and Itching   Amiodarone Itching   Iodine Itching    itching   Ace Inhibitors Cough   Januvia [Sitagliptin] Rash   Lidocaine Hcl Palpitations    ALL CAINES given during dental procedures   Lipitor [Atorvastatin] Other (See Comments)    myalgias    Review of Systems negative except from HPI and PMH  Physical Exam  Ht 5' 2.5" (1.588 m)   Wt 203 lb (92.1 kg)   BMI 36.54 kg/m  Well developed and well nourished in no acute distress HENT normal Neck supple with JVP-flat Clear Device pocket well healed; without hematoma or erythema.  There is no tethering  Regular rate and rhythm, no  murmur Abd-soft with active BS No Clubbing cyanosis  edema Skin-warm and dry A & Oriented  Grossly normal sensory and motor function  ECG a pacing competing with sinus 16/08/39  Device function is normal. Programming changes none  See Paceart for  details    Assessment and  Plan  Atrial fibrillation-paroxysmal status post PVI [facility] 4/15  Hypertension  Hyperlipidemia  Sinus node dysfunction  Diabetes.  LV dysfunction-recovered  Heart failure- diastolic chronic  Pacemaker Medtronic    Euvolemic  continue HCTZ and qod lasix  BP is elevated so we will try adding an alpha-blocker by discontinuing her metoprolol and using labetalol 200 twice daily.  Another strategy would be to stop the hydrochlorothiazide and use spironolactone  She has been intolerant of statins.  Will  defer to her PCP recommendations to PCSK9 although primary prevention 77 year old lady be justified   No interval afib--some short atrial tach

## 2022-03-22 ENCOUNTER — Other Ambulatory Visit: Payer: Self-pay | Admitting: Family Medicine

## 2022-03-22 NOTE — Telephone Encounter (Signed)
Last office visit 02/10/22 for allergic dermatitis.  Last refilled 02/14/2022 for 30 g with no refills.  Next Appt: 07/20/22 for 6 month follow up.

## 2022-03-23 DIAGNOSIS — L239 Allergic contact dermatitis, unspecified cause: Secondary | ICD-10-CM | POA: Insufficient documentation

## 2022-03-23 NOTE — Assessment & Plan Note (Signed)
Continue antihistamine.  Apply topical steroid cream twice daily.  Apply topical daily moisturizing cream.. Cetaphil cream.  Avoid super hot showers.  Call if not improving as expected for possible oral steroid course.

## 2022-03-24 ENCOUNTER — Ambulatory Visit (INDEPENDENT_AMBULATORY_CARE_PROVIDER_SITE_OTHER): Payer: Medicare Other | Admitting: Family Medicine

## 2022-03-24 ENCOUNTER — Encounter: Payer: Self-pay | Admitting: *Deleted

## 2022-03-24 ENCOUNTER — Encounter: Payer: Self-pay | Admitting: Family Medicine

## 2022-03-24 VITALS — BP 158/80 | HR 97 | Temp 98.6°F | Ht 62.5 in | Wt 200.0 lb

## 2022-03-24 DIAGNOSIS — L239 Allergic contact dermatitis, unspecified cause: Secondary | ICD-10-CM | POA: Diagnosis not present

## 2022-03-24 DIAGNOSIS — R609 Edema, unspecified: Secondary | ICD-10-CM

## 2022-03-24 LAB — BASIC METABOLIC PANEL
BUN: 21 mg/dL (ref 6–23)
CO2: 27 mEq/L (ref 19–32)
Calcium: 9.9 mg/dL (ref 8.4–10.5)
Chloride: 104 mEq/L (ref 96–112)
Creatinine, Ser: 1.19 mg/dL (ref 0.40–1.20)
GFR: 44.37 mL/min — ABNORMAL LOW (ref >60.00)
Glucose, Bld: 109 mg/dL — ABNORMAL HIGH (ref 70–99)
Potassium: 4.4 mEq/L (ref 3.5–5.1)
Sodium: 140 mEq/L (ref 135–145)

## 2022-03-24 MED ORDER — TRIAMCINOLONE ACETONIDE 0.1 % EX CREA: 1.0000 | TOPICAL_CREAM | Freq: Two times a day (BID) | CUTANEOUS | 0 refills | Status: DC

## 2022-03-24 MED ORDER — PREDNISONE 20 MG PO TABS: 20.0000 mg | ORAL_TABLET | Freq: Every day | ORAL | 0 refills | Status: DC

## 2022-03-24 NOTE — Assessment & Plan Note (Addendum)
Chronic, with acute flare.  She has restarted her furosemide at 20 mg every other day.  I will check labs for possible secondary causes with renal function, liver function and thyroid. She will elevate feet above heart is much as able, return to regular exercise.    If labs unremarkable, will increase furosemide dosing to 20 mg daily for the next 3 to 4 days.  Follow-up with PCP if not improving

## 2022-03-24 NOTE — Assessment & Plan Note (Signed)
Current skin rash appears most consistent with allergic dermatitis with possible compression reaction in bilateral ankles in setting of peripheral edema.  She has upcoming appointment with dermatologist. She has had slight improvement with topical steroids but we will move forward given progression of rash with prednisone taper.  I will also make a referral for her to an allergist given she cannot see the dermatologist until April.

## 2022-03-24 NOTE — Patient Instructions (Signed)
Please stop at the lab to have labs drawn... to check potassium and renal function with recent every other day lasix.  Elevate feet above heart as much as able.  Stop the increased salt.  Complete prednisone taper.  Call if redness is spreading on leg or if new fever.

## 2022-03-24 NOTE — Progress Notes (Signed)
Patient ID: Nina Carpenter, female    DOB: 05/15/1945, 77 y.o.   MRN: [id number]  This visit was conducted in person.  BP (!) 158/80   Pulse 97   Temp 98.6 F (37 C) (Temporal)   Ht 5' 2.5" (1.588 m)   Wt 200 lb (90.7 kg)   SpO2 91%   BMI 36.00 kg/m    CC:  Chief Complaint  Patient presents with   Rash    All over back and legs and feet x few days     Subjective:   HPI: Nina Carpenter is a 77 y.o. female  atrial fibrillation s/p ablation  on eliquis longterm, has pacemake presenting on 03/24/2022 for Rash (All over back and legs and feet x few days )  She reports  worsening of rash on back and leg, feet ongoing.  Now in last week.. new swelling  in left leg.  Noted she has chronic swelling in left leg... was wearing boots that were tight.. resulted in worsening of rash in left ankle.    Has furosemide to use for peripheral swelling off and on.  She has had more pretzels with alst lately.  She has started back furosemide in last week.. every other day.     I have seen her for similar issue in the past.. reviewed OV note  from 02/10/2022.  Treated with topical triamcinolone 0.5%, improves slightly but never gone. No new exposure, no new lotion or  detergent, soaps.   Son has new dog in last 6 months.  No new med changes.   Has Dermatology appt in April.   No oral swelling, no SOB, no wheeze.     BP elevated today but has not yet taken BP meds... losartan 100 mg daily    Blood sugars 106-, never > 130.  Relevant past medical, surgical, family and social history reviewed and updated as indicated. Interim medical history since our last visit reviewed. Allergies and medications reviewed and updated. Outpatient Medications Prior to Visit  Medication Sig Dispense Refill   Accu-Chek FastClix Lancets MISC USE TO CHECK BLOOD SUGAR ONCE EVERY 2 WEEKS 102 each 0   albuterol (VENTOLIN HFA) 108 (90 Base) MCG/ACT inhaler Inhale 2 puffs into the lungs every 6 (six) hours as  needed for wheezing or shortness of breath. 8 g 2   apixaban (ELIQUIS) 5 MG TABS tablet Take 1 tablet (5 mg total) by mouth 2 (two) times daily. 60 tablet 3   Blood Glucose Calibration (ACCU-CHEK GUIDE CONTROL) LIQD 1 each by In Vitro route as directed. 1 each 0   Blood Glucose Monitoring Suppl (ACCU-CHEK GUIDE ME) w/Device KIT 1 each by Does not apply route every 14 (fourteen) days. Use to check FBS and 2 HR PP once every 2 weeks 1 kit 0   furosemide (LASIX) 20 MG tablet Take 1 tablet (20 mg) by mouth once every other day 45 tablet 3   glipiZIDE (GLUCOTROL XL) 10 MG 24 hr tablet TAKE 1 TABLET(10 MG) BY MOUTH DAILY WITH BREAKFAST 90 tablet 1   glucose blood (ACCU-CHEK GUIDE) test strip USE TO CHECK A FASTING BLOOD SUGAR ONCE DAILY 100 strip 3   hydrochlorothiazide (HYDRODIURIL) 25 MG tablet Take 25 mg by mouth daily.     labetalol (NORMODYNE) 200 MG tablet Take 1 tablet (200 mg total) by mouth 2 (two) times daily. 180 tablet 3   losartan (COZAAR) 100 MG tablet TAKE 1 TABLET(100 MG) BY MOUTH DAILY 90 tablet 3  metFORMIN (GLUCOPHAGE-XR) 500 MG 24 hr tablet TAKE 3 TABLETS(1500 MG) BY MOUTH DAILY WITH BREAKFAST 90 tablet 5   mometasone (NASONEX) 50 MCG/ACT nasal spray SHAKE LIQUID AND USE 2 SPRAYS IN EACH NOSTRIL DAILY AS NEEDED 51 g 0   montelukast (SINGULAIR) 10 MG tablet Take 1 tablet (10 mg total) by mouth at bedtime as needed. 90 tablet 3   pioglitazone (ACTOS) 30 MG tablet TAKE 1 TABLET(30 MG) BY MOUTH DAILY 90 tablet 1   triamcinolone cream (KENALOG) 0.5 % APPLY TOPICALLY TO THE AFFECTED AREA TWICE DAILY 30 g 0   No facility-administered medications prior to visit.     Per HPI unless specifically indicated in ROS section below Review of Systems  Constitutional:  Negative for fatigue and fever.  HENT:  Negative for congestion.   Eyes:  Negative for pain.  Respiratory:  Negative for cough and shortness of breath.   Cardiovascular:  Positive for leg swelling. Negative for chest pain and  palpitations.  Gastrointestinal:  Negative for abdominal pain.  Genitourinary:  Negative for dysuria and vaginal bleeding.  Musculoskeletal:  Negative for back pain.  Neurological:  Negative for syncope, light-headedness and headaches.  Psychiatric/Behavioral:  Negative for dysphoric mood.    Objective:  BP (!) 158/80   Pulse 97   Temp 98.6 F (37 C) (Temporal)   Ht 5' 2.5" (1.588 m)   Wt 200 lb (90.7 kg)   SpO2 91%   BMI 36.00 kg/m   Wt Readings from Last 3 Encounters:  03/24/22 200 lb (90.7 kg)  02/28/22 203 lb (92.1 kg)  02/10/22 201 lb (91.2 kg)      Physical Exam Constitutional:      General: She is not in acute distress.    Appearance: Normal appearance. She is well-developed. She is not ill-appearing or toxic-appearing.  HENT:     Head: Normocephalic.     Right Ear: Hearing, tympanic membrane, ear canal and external ear normal. Tympanic membrane is not erythematous, retracted or bulging.     Left Ear: Hearing, tympanic membrane, ear canal and external ear normal. Tympanic membrane is not erythematous, retracted or bulging.     Nose: No mucosal edema or rhinorrhea.     Right Sinus: No maxillary sinus tenderness or frontal sinus tenderness.     Left Sinus: No maxillary sinus tenderness or frontal sinus tenderness.     Mouth/Throat:     Pharynx: Uvula midline.  Eyes:     General: Lids are normal. Lids are everted, no foreign bodies appreciated.     Conjunctiva/sclera: Conjunctivae normal.     Pupils: Pupils are equal, round, and reactive to light.  Neck:     Thyroid: No thyroid mass or thyromegaly.     Vascular: No carotid bruit.     Trachea: Trachea normal.  Cardiovascular:     Rate and Rhythm: Normal rate and regular rhythm.     Pulses: Normal pulses.     Heart sounds: Normal heart sounds, S1 normal and S2 normal. No murmur heard.    No friction rub. No gallop.  Pulmonary:     Effort: Pulmonary effort is normal. No tachypnea or respiratory distress.     Breath  sounds: Normal breath sounds. No decreased breath sounds, wheezing, rhonchi or rales.  Abdominal:     General: Bowel sounds are normal.     Palpations: Abdomen is soft.     Tenderness: There is no abdominal tenderness.  Musculoskeletal:     Cervical back: Normal range of  motion and neck supple.     Right lower leg: 1+ Edema present.     Left lower leg: 2+ Edema present.  Skin:    General: Skin is warm and dry.     Findings: No rash.  Neurological:     Mental Status: She is alert.  Psychiatric:        Mood and Affect: Mood is not anxious or depressed.        Speech: Speech normal.        Behavior: Behavior normal. Behavior is cooperative.        Thought Content: Thought content normal.        Judgment: Judgment normal.            Results for orders placed or performed in visit on [id number]  Basic Metabolic Panel  Result Value Ref Range   Sodium 140 135 - 145 mEq/L   Potassium 4.4 3.5 - 5.1 mEq/L   Chloride 104 96 - 112 mEq/L   CO2 27 19 - 32 mEq/L   Glucose, Bld 109 (H) 70 - 99 mg/dL   BUN 21 6 - 23 mg/dL   Creatinine, Ser 1.19 0.40 - 1.20 mg/dL   GFR 44.37 (L) >60.00 mL/min   Calcium 9.9 8.4 - 10.5 mg/dL    Assessment and Plan  Peripheral edema Assessment & Plan: Chronic, with acute flare.  She has restarted her furosemide at 20 mg every other day.  I will check labs for possible secondary causes with renal function, liver function and thyroid. She will elevate feet above heart is much as able, return to regular exercise.    If labs unremarkable, will increase furosemide dosing to 20 mg daily for the next 3 to 4 days.  Follow-up with PCP if not improving  Orders: -     Basic metabolic panel  Allergic dermatitis Assessment & Plan: Current skin rash appears most consistent with allergic dermatitis with possible compression reaction in bilateral ankles in setting of peripheral edema.  She has upcoming appointment with dermatologist. She has had slight  improvement with topical steroids but we will move forward given progression of rash with prednisone taper.  I will also make a referral for her to an allergist given she cannot see the dermatologist until April.  Orders: -     Ambulatory referral to Allergy  Other orders -     predniSONE; Take 1 tablet (20 mg total) by mouth daily with breakfast.  Dispense: 15 tablet; Refill: 0 -     Triamcinolone Acetonide; Apply 1 Application topically 2 (two) times daily.  Dispense: 454 g; Refill: 0    No follow-ups on file.   Eliezer Lofts, MD

## 2022-03-29 ENCOUNTER — Encounter: Payer: Self-pay | Admitting: Internal Medicine

## 2022-04-10 ENCOUNTER — Telehealth: Payer: Self-pay | Admitting: Family Medicine

## 2022-04-10 MED ORDER — SEMAGLUTIDE (1 MG/DOSE) 4 MG/3ML ~~LOC~~ SOPN: 1.0000 mg | PEN_INJECTOR | SUBCUTANEOUS | 5 refills | Status: DC

## 2022-04-10 MED ORDER — OZEMPIC (0.25 OR 0.5 MG/DOSE) 2 MG/3ML ~~LOC~~ SOPN: PEN_INJECTOR | SUBCUTANEOUS | 1 refills | Status: DC

## 2022-04-10 NOTE — Telephone Encounter (Signed)
Patient was seen by [facility] on 03/24/2022. She was prescribed prednisone,which was helping her with her itching and rash. She called in today stating that now that she is done with the medication,the rash and itching has returned. After doing some research,they think that  glipiZIDE (GLUCOTROL XL) 10 MG 24 hr tablet is the medication that may be causing this. She would like to know if another medication can be prescribed to replace it?

## 2022-04-10 NOTE — Telephone Encounter (Signed)
Nina Carpenter notified as instructed by telephone.  She states she had the same reaction to Januvia too. She is agreeable to trying the Ozempic.  [facility] on [address].

## 2022-04-10 NOTE — Telephone Encounter (Signed)
Please call  Would be an unusual reaction for that medication, but we could always transition her to [place] or another injectable instead if she is open to it.  OK with her?

## 2022-04-10 NOTE — Telephone Encounter (Signed)
   Medication Management during today's office visit: Meds ordered this encounter  Medications   Semaglutide,0.25 or 0.5MG /DOS, (OZEMPIC, 0.25 OR 0.5 MG/DOSE,) 2 MG/3ML SOPN    Sig: 0.25 mg injected Subcutaneously once weekly for 4 weeks, then increase to 0.5 mg once weekly    Dispense:  3 mL    Refill:  1   Semaglutide, 1 MG/DOSE, 4 MG/3ML SOPN    Sig: Inject 1 mg into the skin once a week.    Dispense:  3 mL    Refill:  5    Refill 2 and beyond

## 2022-04-12 ENCOUNTER — Other Ambulatory Visit: Payer: Self-pay | Admitting: Family Medicine

## 2022-04-13 ENCOUNTER — Telehealth: Payer: Self-pay | Admitting: Family Medicine

## 2022-04-13 ENCOUNTER — Ambulatory Visit (INDEPENDENT_AMBULATORY_CARE_PROVIDER_SITE_OTHER): Payer: Medicare Other | Admitting: Family Medicine

## 2022-04-13 ENCOUNTER — Telehealth: Payer: Self-pay | Admitting: Internal Medicine

## 2022-04-13 ENCOUNTER — Encounter: Payer: Self-pay | Admitting: Family Medicine

## 2022-04-13 VITALS — BP 150/60 | HR 92 | Temp 99.3°F | Ht 62.5 in | Wt 201.0 lb

## 2022-04-13 DIAGNOSIS — E1159 Type 2 diabetes mellitus with other circulatory complications: Secondary | ICD-10-CM | POA: Diagnosis not present

## 2022-04-13 DIAGNOSIS — R21 Rash and other nonspecific skin eruption: Secondary | ICD-10-CM | POA: Diagnosis not present

## 2022-04-13 DIAGNOSIS — R600 Localized edema: Secondary | ICD-10-CM

## 2022-04-13 DIAGNOSIS — L03116 Cellulitis of left lower limb: Secondary | ICD-10-CM | POA: Diagnosis not present

## 2022-04-13 MED ORDER — TRIAMCINOLONE ACETONIDE 0.5 % EX OINT: 1.0000 | TOPICAL_OINTMENT | Freq: Two times a day (BID) | CUTANEOUS | 2 refills | Status: DC

## 2022-04-13 MED ORDER — OZEMPIC (0.25 OR 0.5 MG/DOSE) 2 MG/3ML ~~LOC~~ SOPN: PEN_INJECTOR | SUBCUTANEOUS | 1 refills | Status: DC

## 2022-04-13 MED ORDER — DOXYCYCLINE HYCLATE 100 MG PO TABS: 100.0000 mg | ORAL_TABLET | Freq: Two times a day (BID) | ORAL | 0 refills | Status: DC

## 2022-04-13 MED ORDER — SEMAGLUTIDE (1 MG/DOSE) 4 MG/3ML ~~LOC~~ SOPN: 1.0000 mg | PEN_INJECTOR | SUBCUTANEOUS | 5 refills | Status: DC

## 2022-04-13 NOTE — Progress Notes (Signed)
Nina Fredin T. Hong Timm, MD, [place] at [facility] [place], [address]  Phone: [phone]  FAX: [phone]  Nina Carpenter - 77 y.o. female  MRN [id number]  Date of Birth: 04/07/1945  Date: 04/13/2022  PCP: [name], MD  Referral: [name], MD  Chief Complaint  Patient presents with   Rash    This started after you finished the prednisone prescribed by Dr. Diona Browner   Foot Swelling   Subjective:   AVAIYA Carpenter is a 77 y.o. very pleasant female patient with Body mass index is 36.18 kg/m. who presents with the following:  Swollen, red hot leg.  She has been having intermittent edema bilateral lower extremities.  She has also been having a rash it has been relatively diffuse.  She is using some triamcinolone 0.5% cream.  She did try the large jar of 0.1% cream without much significant relief of symptoms.  My partner Dr. Diona Browner did give her some oral prednisone which provided some immediate relief of the rash, but she stopped it when her blood sugar was greater than 400.  She thinks the rash is from glipizide and actos.  She wants to stop both of them. While her blood sugars have recently been under good control, historically they have been under poor control.  She is willing to stay on her metformin.  Review of Systems is noted in the HPI, as appropriate  Objective:   BP (!) 150/60   Pulse 92   Temp 99.3 F (37.4 C) (Temporal)   Ht 5' 2.5" (1.588 m)   Wt 201 lb (91.2 kg)   SpO2 97%   BMI 36.18 kg/m   GEN: No acute distress; alert,appropriate. PULM: Breathing comfortably in no respiratory distress PSYCH: Normally interactive.       The reddened part of the rash on the left lower leg is mildly warm and tender to palpation.  Laboratory and Imaging Data:  Assessment and Plan:     ICD-10-CM   1. Rash  R21     2. Bilateral lower extremity edema  R60.0     3. Left leg  cellulitis  L03.116     4. Type 2 diabetes mellitus with vascular disease ([place])  E11.59      Unknown rash.  Responsive to corticosteroid.  I am thankful that she has a dermatology appointment in 2 weeks, and we need their direction.  Lower extremity edema with preserved ejection fraction.  She has had some dependent edema off and on now for years.  I would have her use her Lasix every day 20 mg for the next week and then resume every other day.  Diabetes.  She is concerned that her rash is from both glipizide and Actos.  I am doubtful that this is the case, but given her strong preferences we are going to stop this and if her rash completely goes away then it may be from 1 of these 2 medications.  I am going to have her start Ozempic.  Titrate up the dose.  Close follow-up in 3 months.  Medication Management during today's office visit: Meds ordered this encounter  Medications   triamcinolone ointment (KENALOG) 0.5 %    Sig: Apply 1 Application topically 2 (two) times daily.    Dispense:  60 g    Refill:  2   Semaglutide,0.25 or 0.5MG /DOS, (OZEMPIC, 0.25 OR 0.5 MG/DOSE,) 2 MG/3ML SOPN    Sig: 0.25 mg injected  Subcutaneously once weekly for 4 weeks, then increase to 0.5 mg once weekly    Dispense:  3 mL    Refill:  1   Semaglutide, 1 MG/DOSE, 4 MG/3ML SOPN    Sig: Inject 1 mg into the skin once a week.    Dispense:  3 mL    Refill:  5    Refill 2 and beyond   doxycycline (VIBRA-TABS) 100 MG tablet    Sig: Take 1 tablet (100 mg total) by mouth 2 (two) times daily.    Dispense:  20 tablet    Refill:  0   Medications Discontinued During This Encounter  Medication Reason   predniSONE (DELTASONE) 20 MG tablet Completed Course   Semaglutide, 1 MG/DOSE, 4 MG/3ML SOPN    Semaglutide,0.25 or 0.5MG /DOS, (OZEMPIC, 0.25 OR 0.5 MG/DOSE,) 2 MG/3ML SOPN    triamcinolone cream (KENALOG) 0.1 %    pioglitazone (ACTOS) 30 MG tablet    glipiZIDE (GLUCOTROL XL) 10 MG 24 hr tablet     Orders  placed today for conditions managed today: No orders of the defined types were placed in this encounter.   Disposition: No follow-ups on file.  [employer] One speech-to-text software was used for transcription in this dictation.  Possible transcriptional errors can occur using Editor, commissioning.   Signed,  Maud Deed. Antino Mayabb, MD   Outpatient Encounter Medications as of 04/13/2022  Medication Sig   Accu-Chek FastClix Lancets MISC USE TO CHECK BLOOD SUGAR ONCE EVERY 2 WEEKS   albuterol (VENTOLIN HFA) 108 (90 Base) MCG/ACT inhaler Inhale 2 puffs into the lungs every 6 (six) hours as needed for wheezing or shortness of breath.   apixaban (ELIQUIS) 5 MG TABS tablet Take 1 tablet (5 mg total) by mouth 2 (two) times daily.   Blood Glucose Calibration (ACCU-CHEK GUIDE CONTROL) LIQD 1 each by In Vitro route as directed.   Blood Glucose Monitoring Suppl (ACCU-CHEK GUIDE ME) w/Device KIT 1 each by Does not apply route every 14 (fourteen) days. Use to check FBS and 2 HR PP once every 2 weeks   doxycycline (VIBRA-TABS) 100 MG tablet Take 1 tablet (100 mg total) by mouth 2 (two) times daily.   furosemide (LASIX) 20 MG tablet Take 1 tablet (20 mg) by mouth once every other day   glucose blood (ACCU-CHEK GUIDE) test strip USE TO CHECK A FASTING BLOOD SUGAR ONCE DAILY   hydrochlorothiazide (HYDRODIURIL) 25 MG tablet Take 25 mg by mouth daily.   labetalol (NORMODYNE) 200 MG tablet Take 1 tablet (200 mg total) by mouth 2 (two) times daily.   losartan (COZAAR) 100 MG tablet TAKE 1 TABLET(100 MG) BY MOUTH DAILY   metFORMIN (GLUCOPHAGE-XR) 500 MG 24 hr tablet TAKE 3 TABLETS(1500 MG) BY MOUTH DAILY WITH BREAKFAST   mometasone (NASONEX) 50 MCG/ACT nasal spray SHAKE LIQUID AND USE 2 SPRAYS IN EACH NOSTRIL DAILY AS NEEDED   montelukast (SINGULAIR) 10 MG tablet Take 1 tablet (10 mg total) by mouth at bedtime as needed.   triamcinolone ointment (KENALOG) 0.5 % Apply 1 Application topically 2 (two) times daily.    [DISCONTINUED] glipiZIDE (GLUCOTROL XL) 10 MG 24 hr tablet TAKE 1 TABLET(10 MG) BY MOUTH DAILY WITH BREAKFAST   [DISCONTINUED] pioglitazone (ACTOS) 30 MG tablet TAKE 1 TABLET(30 MG) BY MOUTH DAILY   [DISCONTINUED] Semaglutide,0.25 or 0.5MG /DOS, (OZEMPIC, 0.25 OR 0.5 MG/DOSE,) 2 MG/3ML SOPN 0.25 mg injected Subcutaneously once weekly for 4 weeks, then increase to 0.5 mg once weekly   [DISCONTINUED] triamcinolone cream (KENALOG)  0.1 % Apply 1 Application topically 2 (two) times daily.   Semaglutide, 1 MG/DOSE, 4 MG/3ML SOPN Inject 1 mg into the skin once a week.   Semaglutide,0.25 or 0.5MG /DOS, (OZEMPIC, 0.25 OR 0.5 MG/DOSE,) 2 MG/3ML SOPN 0.25 mg injected Subcutaneously once weekly for 4 weeks, then increase to 0.5 mg once weekly   [DISCONTINUED] predniSONE (DELTASONE) 20 MG tablet Take 1 tablet (20 mg total) by mouth daily with breakfast.   [DISCONTINUED] Semaglutide, 1 MG/DOSE, 4 MG/3ML SOPN Inject 1 mg into the skin once a week. (Patient not taking: Reported on 04/13/2022)   No facility-administered encounter medications on file as of 04/13/2022.

## 2022-04-13 NOTE — Telephone Encounter (Signed)
Patient called back wanted to know if she can get something to substitute until next week. She states she needs call back as soon as possible

## 2022-04-13 NOTE — Telephone Encounter (Signed)
No.  She is on Metformin.  Since she does not want to be on Actos, Glipizide, or Januvia, there are few other options.  Blood sugar should be at a tolerable level until she gets on Ozempic.

## 2022-04-13 NOTE — Telephone Encounter (Signed)
Pt c/o swelling: STAT is pt has developed SOB within 24 hours  If swelling, where is the swelling located? Left leg - warm to the touch  How much weight have you gained and in what time span? NA  Have you gained 3 pounds in a day or 5 pounds in a week? No  Do you have a log of your daily weights (if so, list)? No  Are you currently taking a fluid pill? Yes  Are you currently SOB? No  Have you traveled recently? No   Pt states that she thinks she is having an reaction from one of her medications (   pioglitazone (ACTOS) 30 MG tablet)   the her PCP has prescribed but has yet to receive a callback from that office. She would like a callback from nurse regarding this matter. Please advise

## 2022-04-13 NOTE — Telephone Encounter (Signed)
I think it is extremely unlikely to be from her diabetes medication.  She needs to be seen in the office ASAP.

## 2022-04-13 NOTE — Telephone Encounter (Signed)
Rosie notified as instructed by telephone.  Appointment scheduled with Dr. Lorelei Pont today at 2:40 pm.

## 2022-04-13 NOTE — Telephone Encounter (Signed)
Patient was prescribedSemaglutide, 1 MG/DOSE, 4 MG/3ML SOPN  Semaglutide,0.25 or 0.5MG /DOS, (OZEMPIC, 0.25 OR 0.5 MG/DOSE,) 2 MG/3ML SOPN  today,she called in stating that the pharmacy needs a prior authorization for this medication.

## 2022-04-13 NOTE — Telephone Encounter (Addendum)
Left message for Nina Carpenter to just take the Metformin until we can get the PA done for her Ozempic.  Her blood sugars should be a tolerable range until then per Dr. Lorelei Pont.

## 2022-04-14 ENCOUNTER — Other Ambulatory Visit (HOSPITAL_COMMUNITY): Payer: Self-pay

## 2022-04-14 ENCOUNTER — Encounter: Payer: Self-pay | Admitting: Family Medicine

## 2022-04-14 NOTE — Telephone Encounter (Signed)
error 

## 2022-04-14 NOTE — Telephone Encounter (Signed)
Patient asking for a return call re: ozempic, to see if it is good for her or would she be able to switch to something else

## 2022-04-14 NOTE — Telephone Encounter (Signed)
Pharmacy Patient Advocate Encounter   Received notification that prior authorization for Ozempic 4mg /71ml is required/requested.  Per Test Claim: Prior authorization required   PA submitted on 04/14/22 to (ins) [employer] [place] [employer] via CoverMyMeds Key # [id number] Status is pending

## 2022-04-14 NOTE — Telephone Encounter (Signed)
Shaneek from [place] called in and stated that patient has been approved for Ozempic. She stated effective today for 1 year. She can be reached at [phone] opt 5 and Case #: [id number]

## 2022-04-14 NOTE — Telephone Encounter (Signed)
Rosie notified by telephone that the PA for her Ozempic has been approved.

## 2022-04-16 NOTE — Telephone Encounter (Signed)
No.  She has few options left with her concerns about other medications.  Ozempic is approved, and she should use Ozempic.

## 2022-04-17 NOTE — Telephone Encounter (Signed)
Left message for Nina Carpenter to return call to office.

## 2022-04-17 NOTE — Telephone Encounter (Signed)
Nina Carpenter notified as instructed by telephone.  Patient states she did start the [place].  She ask if she was suppose to continue the Actos.  I advised the only diabetes medication she should currently be taking is 3 tablets on Metformin 500 mg 24 hr tablet every morning and the Ozempic injection once weekly.   Patient states understanding.

## 2022-04-25 NOTE — Telephone Encounter (Signed)
Please call  Since we stopped 2 of her diabetes medications, it does not surprise me that her blood sugar is higher.  As she increases the Ozempic dose every month, her blood sugars should continue to get better.

## 2022-04-25 NOTE — Telephone Encounter (Signed)
Patient called states that she has been having high readings. Ozempic 0.25 started 2 weeks ago. With Metformin 1500mg  daily. She denies any changes in diet or new medications.   Readings 193 fasting  She states that they have been going up after the change 2 weeks ago. Have been in 150's on average.

## 2022-04-26 NOTE — Telephone Encounter (Signed)
Left message for Alison Murray to return my call.

## 2022-04-26 NOTE — Telephone Encounter (Signed)
Nina Carpenter notified as instructed by telephone.  Patient states she takes her Metformin in the evening and ask if that is how she should be taking it.  I advised she should switch to taking her 3 tablets of Metformin in the morning with breakfast.  I also advised patient to not stress unless her FBS gets up to 400 mg/dl per Dr. Lorelei Pont.  Reassured that as we continue to increase her Ozempic monthly, her blood sugar reading will continue to improve.

## 2022-04-27 DIAGNOSIS — D2261 Melanocytic nevi of right upper limb, including shoulder: Secondary | ICD-10-CM | POA: Diagnosis not present

## 2022-04-27 DIAGNOSIS — L821 Other seborrheic keratosis: Secondary | ICD-10-CM | POA: Diagnosis not present

## 2022-04-27 DIAGNOSIS — I872 Venous insufficiency (chronic) (peripheral): Secondary | ICD-10-CM | POA: Diagnosis not present

## 2022-04-27 DIAGNOSIS — D2271 Melanocytic nevi of right lower limb, including hip: Secondary | ICD-10-CM | POA: Diagnosis not present

## 2022-04-27 DIAGNOSIS — D225 Melanocytic nevi of trunk: Secondary | ICD-10-CM | POA: Diagnosis not present

## 2022-04-27 DIAGNOSIS — D2262 Melanocytic nevi of left upper limb, including shoulder: Secondary | ICD-10-CM | POA: Diagnosis not present

## 2022-04-27 DIAGNOSIS — D2272 Melanocytic nevi of left lower limb, including hip: Secondary | ICD-10-CM | POA: Diagnosis not present

## 2022-04-27 DIAGNOSIS — R21 Rash and other nonspecific skin eruption: Secondary | ICD-10-CM | POA: Diagnosis not present

## 2022-04-27 DIAGNOSIS — L578 Other skin changes due to chronic exposure to nonionizing radiation: Secondary | ICD-10-CM | POA: Diagnosis not present

## 2022-05-04 ENCOUNTER — Ambulatory Visit (INDEPENDENT_AMBULATORY_CARE_PROVIDER_SITE_OTHER): Payer: Medicare Other

## 2022-05-04 DIAGNOSIS — I495 Sick sinus syndrome: Secondary | ICD-10-CM | POA: Diagnosis not present

## 2022-05-04 LAB — CUP PACEART REMOTE DEVICE CHECK
Battery Remaining Longevity: 19 mo
Battery Voltage: 2.91 V
Brady Statistic AP VP Percent: 0.12 %
Brady Statistic AP VS Percent: 40.94 %
Brady Statistic AS VP Percent: 0.03 %
Brady Statistic AS VS Percent: 58.92 %
Brady Statistic RA Percent Paced: 40.82 %
Brady Statistic RV Percent Paced: 0.14 %
Date Time Interrogation Session: 20240411144856
Implantable Lead Connection Status: 753985
Implantable Lead Connection Status: 753985
Implantable Lead Implant Date: 20141208
Implantable Lead Implant Date: 20141208
Implantable Lead Location: 753859
Implantable Lead Location: 753860
Implantable Lead Model: 5076
Implantable Lead Model: 5076
Implantable Pulse Generator Implant Date: 20141208
Lead Channel Impedance Value: 342 Ohm
Lead Channel Impedance Value: 380 Ohm
Lead Channel Impedance Value: 380 Ohm
Lead Channel Impedance Value: 418 Ohm
Lead Channel Pacing Threshold Amplitude: 0.625 V
Lead Channel Pacing Threshold Amplitude: 0.75 V
Lead Channel Pacing Threshold Pulse Width: 0.4 ms
Lead Channel Pacing Threshold Pulse Width: 0.4 ms
Lead Channel Sensing Intrinsic Amplitude: 2.5 mV
Lead Channel Sensing Intrinsic Amplitude: 2.5 mV
Lead Channel Sensing Intrinsic Amplitude: 6.5 mV
Lead Channel Sensing Intrinsic Amplitude: 6.5 mV
Lead Channel Setting Pacing Amplitude: 2 V
Lead Channel Setting Pacing Amplitude: 2.5 V
Lead Channel Setting Pacing Pulse Width: 0.4 ms
Lead Channel Setting Sensing Sensitivity: 2 mV
Zone Setting Status: 755011
Zone Setting Status: 755011

## 2022-05-15 ENCOUNTER — Telehealth: Payer: Self-pay | Admitting: Family Medicine

## 2022-05-15 DIAGNOSIS — E1159 Type 2 diabetes mellitus with other circulatory complications: Secondary | ICD-10-CM

## 2022-05-15 MED ORDER — ACCU-CHEK FASTCLIX LANCETS MISC: 0 refills | Status: AC

## 2022-05-15 MED ORDER — ACCU-CHEK GUIDE VI STRP: ORAL_STRIP | 3 refills | Status: AC

## 2022-05-15 MED ORDER — ACCU-CHEK FASTCLIX LANCET KIT: PACK | 0 refills | Status: AC

## 2022-05-15 MED ORDER — METFORMIN HCL ER 500 MG PO TB24: 2000.0000 mg | ORAL_TABLET | Freq: Every day | ORAL | 1 refills | Status: DC

## 2022-05-15 NOTE — Telephone Encounter (Signed)
Rosie notified as instructed by telephone.  She states even on the 0.25 mg she was throwing up acid.  Please advise.

## 2022-05-15 NOTE — Telephone Encounter (Signed)
I discussed all with her.  She has had some nausea.  She had this from the onset of using Ozempic.  This is challenging and that she thinks the Januvia, Actos, and glipizide all could have started a rash for her.  I am doubtful.  Increase metformin to 4 tablets a day.  She will have good follow-up.  Lupita Leash,  She has an appointment in May and June, can we consolidate this to only 1 appointment in June. When you call her, can you also check on what exact Accu-Chek supplies she needs.

## 2022-05-15 NOTE — Telephone Encounter (Signed)
Spoke with Minus Liberty and cancelled the May appointment nad kept appointment as scheduled for 07/20/2022 at 10:20 am.  Also sent in Rx for new dose of Metformin, Fastclix lancet kit and lancets as well as test strips to [facility].

## 2022-05-15 NOTE — Telephone Encounter (Signed)
Patient called in stating that the 0.5 mg dosage of the ozempic is making her really sick. It is causing her nausea and diarrhea. She would like to know what Dr Patsy Lager would like for her to do,continue,or discontinue,or will he lower the dosage? Please advise.

## 2022-05-15 NOTE — Telephone Encounter (Signed)
Please call  She can cut the dose back to 0.25 mg for Ozempic.

## 2022-05-19 ENCOUNTER — Other Ambulatory Visit: Payer: Self-pay | Admitting: Internal Medicine

## 2022-05-19 DIAGNOSIS — I480 Paroxysmal atrial fibrillation: Secondary | ICD-10-CM

## 2022-05-19 NOTE — Telephone Encounter (Signed)
Prescription refill request for Eliquis received. Indication: afib  Last office visit:Klein, 02/28/2022 Scr: 1.19, 03/24/2022 Age: 77 yo  Weight: 91.2 kg   Refill sent.

## 2022-05-25 DIAGNOSIS — J3089 Other allergic rhinitis: Secondary | ICD-10-CM | POA: Diagnosis not present

## 2022-05-25 DIAGNOSIS — R052 Subacute cough: Secondary | ICD-10-CM | POA: Diagnosis not present

## 2022-05-25 DIAGNOSIS — L501 Idiopathic urticaria: Secondary | ICD-10-CM | POA: Diagnosis not present

## 2022-05-25 DIAGNOSIS — Z91030 Bee allergy status: Secondary | ICD-10-CM | POA: Diagnosis not present

## 2022-06-08 ENCOUNTER — Telehealth: Payer: Self-pay | Admitting: Family Medicine

## 2022-06-08 MED ORDER — GLIPIZIDE ER 10 MG PO TB24: 10.0000 mg | ORAL_TABLET | Freq: Every day | ORAL | 1 refills | Status: DC

## 2022-06-08 NOTE — Telephone Encounter (Signed)
Patient called to let Dr Patsy Lager know that after receiving her first shingles shot,she broke out and thought it was the  glipizide medication.However she really thinks now that it was the shingle vaccine.Her daughter received the shingles vaccine as well recently and has broken out the same way. She stated that she will not be getting the second dose,and would to like to know what Dr Patsy Lager thinks?

## 2022-06-08 NOTE — Telephone Encounter (Signed)
Please call  She has started and stopped so many diabetic medications on her own that I am not 100% clear what she is taking.  Right now, I believe that she is taking Metformin 2,000 mg and Ozempic.  If she is still taking Ozempic, then she does not need to add back the Glipizide.

## 2022-06-08 NOTE — Telephone Encounter (Signed)
That is fine.  She does not have to get the second dose if she does not want to.

## 2022-06-08 NOTE — Telephone Encounter (Signed)
Mrs. Nina Carpenter notified as instructed by telephone.  Patient states understanding.  Rx for Glipizide XL 10 mg sent to [facility] on [address]

## 2022-06-08 NOTE — Telephone Encounter (Signed)
Call  Continue Metformin 2,000 mg a day Start Glipizide 10 mg once a day for now

## 2022-06-08 NOTE — Telephone Encounter (Signed)
She told me the only thing she is currently taking is the Metformin.

## 2022-06-08 NOTE — Telephone Encounter (Signed)
Mrs. Hinson notified as instructed by telephone.  She would like the restart the glipizide 10 mg daily since she now believes the rash was from the [place] vaccine.  She is currently taking Metformin XR 500 mg 4 tablets daily.  She is asking if she restarts the Glipizide will she still take 2000 mg of Metformin or should she drop it down to 1500 mg.  Please advise.

## 2022-06-09 NOTE — Progress Notes (Signed)
Remote pacemaker transmission.   

## 2022-06-14 ENCOUNTER — Ambulatory Visit: Payer: Medicare Other | Admitting: Family Medicine

## 2022-06-21 ENCOUNTER — Other Ambulatory Visit: Payer: Self-pay | Admitting: Family Medicine

## 2022-07-03 ENCOUNTER — Other Ambulatory Visit (HOSPITAL_COMMUNITY): Payer: Self-pay

## 2022-07-13 DIAGNOSIS — L281 Prurigo nodularis: Secondary | ICD-10-CM | POA: Diagnosis not present

## 2022-07-18 NOTE — Progress Notes (Signed)
Nina Carpenter. Nina Matlack, MD, CAQ Sports Medicine [facility] [address] [place], [address]  Phone: [phone]  FAX: [phone]  Nina Carpenter - 77 y.o. female  MRN [id number]  Date of Birth: [birth year]  Date: 07/20/2022  PCP: Nina Beat, MD  Referral: Nina Beat, MD  Chief Complaint  Patient presents with   Diabetes   Subjective:   Nina Carpenter is a 77 y.o. very pleasant female patient with Body mass index is 34.39 kg/m. who presents with the following:  She is here for 76-month medical follow-up.  She has multiple ongoing chronic medical conditions including a history of prior stroke, A-fib, diabetes, hypertension, hyperlipidemia.  Diabetes Mellitus: Tolerating Medications: Metformin and Glipizide -she has called and message in multiple times over the last few months and has stopped various diabetes medicines, worried about side effects.  Right now she has been able to resume both her metformin and glipizide, and she is not having any kind of side effects at all. Compliance with diet: fair, Body mass index is 34.39 kg/m. Exercise: minimal / intermittent Avg blood sugars at home: not checking Foot problems: none Hypoglycemia: none No nausea, vomitting, blurred vision, polyuria.  Lab Results  Component Value Date   HGBA1C 6.7 (A) 07/20/2022   HGBA1C 6.2 01/11/2022   HGBA1C 5.8 (A) 12/01/2021   Lab Results  Component Value Date   MICROALBUR 1.3 01/11/2022   LDLCALC 132 (H) 01/11/2022   CREATININE 1.19 03/24/2022    Wt Readings from Last 3 Encounters:  07/20/22 186 lb 8 oz (84.6 kg)  04/13/22 201 lb (91.2 kg)  03/24/22 200 lb (90.7 kg)    - has lost 15 pounds  HTN: Tolerating all medications without side effects - did not take any pills this morning, but normally never forgets and always takes it.  Stable and at goal No CP, no sob. No HA.  BP Readings from Last 3 Encounters:  07/20/22 (!) 160/70   04/13/22 (!) 150/60  03/24/22 (!) 158/80    Basic Metabolic Panel:    Component Value Date/Time   NA 140 03/24/2022 0918   NA 140 11/23/2020 1134   NA 139 05/09/2013 2023   K 4.4 03/24/2022 0918   K 3.9 05/09/2013 2023   CL 104 03/24/2022 0918   CL 103 05/09/2013 2023   CO2 27 03/24/2022 0918   CO2 29 05/09/2013 2023   BUN 21 03/24/2022 0918   BUN 29 (H) 11/23/2020 1134   BUN 14 05/09/2013 2023   CREATININE 1.19 03/24/2022 0918   CREATININE 0.97 05/09/2013 2023   GLUCOSE 109 (H) 03/24/2022 0918   GLUCOSE 141 (H) 05/09/2013 2023   CALCIUM 9.9 03/24/2022 0918   CALCIUM 9.5 05/09/2013 2023    Lipids: Doing well, stable. Tolerating meds fine with no SE. Panel reviewed with patient.  Lipids: Lab Results  Component Value Date   CHOL 202 (H) 01/11/2022   Lab Results  Component Value Date   HDL 42.50 01/11/2022   Lab Results  Component Value Date   LDLCALC 132 (H) 01/11/2022   Lab Results  Component Value Date   TRIG 134.0 01/11/2022   Lab Results  Component Value Date   CHOLHDL 5 01/11/2022    Lab Results  Component Value Date   ALT 12 01/11/2022   AST 14 01/11/2022   ALKPHOS 82 01/11/2022   BILITOT 0.7 01/11/2022     Review of Systems is noted in the HPI, as appropriate  Objective:   BP (!) 160/70   Pulse 86   Temp 98.4 F (36.9 C) (Temporal)   Ht 5' 1.75" (1.568 m)   Wt 186 lb 8 oz (84.6 kg)   SpO2 99%   BMI 34.39 kg/m   GEN: No acute distress; alert,appropriate. PULM: Breathing comfortably in no respiratory distress PSYCH: Normally interactive.  CV: RRR, no m/g/r   Laboratory and Imaging Data: Results for orders placed or performed in visit on 07/20/22  POCT glycosylated hemoglobin (Hb A1C)  Result Value Ref Range   Hemoglobin A1C 6.7 (A) 4.0 - 5.6 %   HbA1c POC (<> result, manual entry)     HbA1c, POC (prediabetic range)     HbA1c, POC (controlled diabetic range)       Assessment and Plan:     ICD-10-CM   1. Type 2 diabetes  mellitus with vascular disease (HCC)  E11.59 POCT glycosylated hemoglobin (Hb A1C)    2. History of stroke  Z86.73     3. Essential hypertension  I10     4. Hyperlipidemia LDL goal <70  E78.5      Diabetes is stable at 6.7 despite multiple different ongoing medication changes and concerns about possible side effects.  She is now stable on metformin and glipizide.  Blood pressure is elevated.  She is not taking her medications this morning.  I suspect that her lack of medication is driving the elevated blood pressures.  We can adjust in the future if continue to be elevated, but she has been under fairly good control in recent years.  Lipids.  She is statin intolerant, and cholesterol is reasonable even without medication.  Ideally, she would be able to go on a statin, but she has been resistant and intolerant.  Medication Management during today's office visit: No orders of the defined types were placed in this encounter.  Medications Discontinued During This Encounter  Medication Reason   albuterol (VENTOLIN HFA) 108 (90 Base) MCG/ACT inhaler Completed Course   labetalol (NORMODYNE) 200 MG tablet Completed Course    Orders placed today for conditions managed today: Orders Placed This Encounter  Procedures   POCT glycosylated hemoglobin (Hb A1C)    Disposition: Return in about 6 months (around 01/19/2023) for Medicare Wellness Exam.  Dragon [employer] One speech-to-text software was used for transcription in this dictation.  Possible transcriptional errors can occur using Animal nutritionist.   Signed,  Elpidio Galea. Mildreth Reek, MD   Outpatient Encounter Medications as of 07/20/2022  Medication Sig   Accu-Chek FastClix Lancets MISC USE TO CHECK BLOOD SUGAR ONCE DAILY   augmented betamethasone dipropionate (DIPROLENE-AF) 0.05 % ointment Apply topically daily as needed.   Blood Glucose Calibration (ACCU-CHEK GUIDE CONTROL) LIQD 1 each by In Vitro route as directed.   Blood Glucose  Monitoring Suppl (ACCU-CHEK GUIDE ME) w/Device KIT 1 each by Does not apply route every 14 (fourteen) days. Use to check FBS and 2 HR PP once every 2 weeks   clobetasol (TEMOVATE) 0.05 % external solution Apply topically daily.   doxycycline (VIBRAMYCIN) 100 MG capsule Take 100 mg by mouth 2 (two) times daily.   ELIQUIS 5 MG TABS tablet TAKE 1 TABLET(5 MG) BY MOUTH TWICE DAILY   EPINEPHrine 0.3 mg/0.3 mL IJ SOAJ injection Inject 0.3 mg into the muscle as needed.   fexofenadine (ALLEGRA) 180 MG tablet Take 180 mg by mouth daily.   furosemide (LASIX) 20 MG tablet Take 1 tablet (20 mg) by mouth once every other day  glipiZIDE (GLIPIZIDE XL) 10 MG 24 hr tablet Take 1 tablet (10 mg total) by mouth daily with breakfast.   glucose blood (ACCU-CHEK GUIDE) test strip USE TO CHECK BLOOD SUGAR ONCE DAILY   hydrochlorothiazide (HYDRODIURIL) 25 MG tablet Take 25 mg by mouth daily.   Lancets Misc. (ACCU-CHEK FASTCLIX LANCET) KIT Use to check blood sugar once daily   losartan (COZAAR) 100 MG tablet TAKE 1 TABLET(100 MG) BY MOUTH DAILY   metFORMIN (GLUCOPHAGE-XR) 500 MG 24 hr tablet Take 4 tablets (2,000 mg total) by mouth daily with breakfast.   metoprolol tartrate (LOPRESSOR) 100 MG tablet Take 100 mg by mouth 2 (two) times daily.   mometasone (NASONEX) 50 MCG/ACT nasal spray SHAKE LIQUID AND USE 2 SPRAYS IN EACH NOSTRIL DAILY AS NEEDED   montelukast (SINGULAIR) 10 MG tablet Take 1 tablet (10 mg total) by mouth at bedtime as needed.   triamcinolone ointment (KENALOG) 0.5 % Apply 1 Application topically 2 (two) times daily.   [DISCONTINUED] albuterol (VENTOLIN HFA) 108 (90 Base) MCG/ACT inhaler Inhale 2 puffs into the lungs every 6 (six) hours as needed for wheezing or shortness of breath.   [DISCONTINUED] labetalol (NORMODYNE) 200 MG tablet Take 1 tablet (200 mg total) by mouth 2 (two) times daily.   No facility-administered encounter medications on file as of 07/20/2022.

## 2022-07-20 ENCOUNTER — Ambulatory Visit (INDEPENDENT_AMBULATORY_CARE_PROVIDER_SITE_OTHER): Payer: Medicare Other | Admitting: Family Medicine

## 2022-07-20 ENCOUNTER — Encounter: Payer: Self-pay | Admitting: Family Medicine

## 2022-07-20 VITALS — BP 160/70 | HR 86 | Temp 98.4°F | Ht 61.75 in | Wt 186.5 lb

## 2022-07-20 DIAGNOSIS — Z7984 Long term (current) use of oral hypoglycemic drugs: Secondary | ICD-10-CM | POA: Diagnosis not present

## 2022-07-20 DIAGNOSIS — Z8673 Personal history of transient ischemic attack (TIA), and cerebral infarction without residual deficits: Secondary | ICD-10-CM

## 2022-07-20 DIAGNOSIS — E1159 Type 2 diabetes mellitus with other circulatory complications: Secondary | ICD-10-CM

## 2022-07-20 DIAGNOSIS — I10 Essential (primary) hypertension: Secondary | ICD-10-CM | POA: Diagnosis not present

## 2022-07-20 DIAGNOSIS — E785 Hyperlipidemia, unspecified: Secondary | ICD-10-CM

## 2022-07-20 LAB — POCT GLYCOSYLATED HEMOGLOBIN (HGB A1C): Hemoglobin A1C: 6.7 % — AB (ref 4.0–5.6)

## 2022-08-02 DIAGNOSIS — R21 Rash and other nonspecific skin eruption: Secondary | ICD-10-CM | POA: Diagnosis not present

## 2022-08-02 DIAGNOSIS — I872 Venous insufficiency (chronic) (peripheral): Secondary | ICD-10-CM | POA: Diagnosis not present

## 2022-08-03 ENCOUNTER — Ambulatory Visit (INDEPENDENT_AMBULATORY_CARE_PROVIDER_SITE_OTHER): Payer: Medicare Other

## 2022-08-03 DIAGNOSIS — I495 Sick sinus syndrome: Secondary | ICD-10-CM

## 2022-08-04 ENCOUNTER — Telehealth: Payer: Self-pay | Admitting: Internal Medicine

## 2022-08-04 NOTE — Telephone Encounter (Signed)
Remote transmission was received. Patient appreciative of call back to advise.

## 2022-08-04 NOTE — Telephone Encounter (Signed)
Patient stated she will be sending remote transmission today as she was at doctor's office yesterday and unable to transmit.  Patient wants call back to confirm transmission received.

## 2022-08-07 LAB — CUP PACEART REMOTE DEVICE CHECK
Battery Remaining Longevity: 14 mo
Battery Voltage: 2.89 V
Brady Statistic AP VP Percent: 0.07 %
Brady Statistic AP VS Percent: 37.42 %
Brady Statistic AS VP Percent: 0.03 %
Brady Statistic AS VS Percent: 62.48 %
Brady Statistic RA Percent Paced: 37.43 %
Brady Statistic RV Percent Paced: 0.1 %
Date Time Interrogation Session: 20240712151344
Implantable Lead Connection Status: 753985
Implantable Lead Connection Status: 753985
Implantable Lead Implant Date: 20141208
Implantable Lead Implant Date: 20141208
Implantable Lead Location: 753859
Implantable Lead Location: 753860
Implantable Lead Model: 5076
Implantable Lead Model: 5076
Implantable Pulse Generator Implant Date: 20141208
Lead Channel Impedance Value: 342 Ohm
Lead Channel Impedance Value: 361 Ohm
Lead Channel Impedance Value: 380 Ohm
Lead Channel Impedance Value: 399 Ohm
Lead Channel Pacing Threshold Amplitude: 0.625 V
Lead Channel Pacing Threshold Amplitude: 0.625 V
Lead Channel Pacing Threshold Pulse Width: 0.4 ms
Lead Channel Pacing Threshold Pulse Width: 0.4 ms
Lead Channel Sensing Intrinsic Amplitude: 1.875 mV
Lead Channel Sensing Intrinsic Amplitude: 1.875 mV
Lead Channel Sensing Intrinsic Amplitude: 7.25 mV
Lead Channel Sensing Intrinsic Amplitude: 7.25 mV
Lead Channel Setting Pacing Amplitude: 2 V
Lead Channel Setting Pacing Amplitude: 2.5 V
Lead Channel Setting Pacing Pulse Width: 0.4 ms
Lead Channel Setting Sensing Sensitivity: 2 mV
Zone Setting Status: 755011
Zone Setting Status: 755011

## 2022-08-17 ENCOUNTER — Other Ambulatory Visit: Payer: Self-pay | Admitting: Family Medicine

## 2022-08-17 NOTE — Telephone Encounter (Signed)
Last office visit 07/20/2022 for DM.  Last refilled 04/13/2022 for 60 g with 2 refills.  Next Appt: 01/29/23 for 6 month follow up.

## 2022-08-22 NOTE — Progress Notes (Signed)
Remote pacemaker transmission.   

## 2022-08-30 DIAGNOSIS — L281 Prurigo nodularis: Secondary | ICD-10-CM | POA: Diagnosis not present

## 2022-08-30 DIAGNOSIS — D225 Melanocytic nevi of trunk: Secondary | ICD-10-CM | POA: Diagnosis not present

## 2022-09-21 ENCOUNTER — Other Ambulatory Visit: Payer: Self-pay | Admitting: Family Medicine

## 2022-09-21 ENCOUNTER — Telehealth: Payer: Self-pay | Admitting: Family Medicine

## 2022-09-21 NOTE — Telephone Encounter (Signed)
Prescription Request  09/21/2022  LOV: 07/20/2022  What is the name of the medication or equipment?  metoprolol tartrate (LOPRESSOR) 100 MG tablet   Have you contacted your pharmacy to request a refill? Yes   Which pharmacy would you like this sent to?   [id number] - [place], [place] - [address] AT [facility] [address] [place] [address] Phone: [phone] Fax: [phone]     Patient notified that their request is being sent to the clinical staff for review and that they should receive a response within 2 business days.   Please advise at [facility] [phone]

## 2022-09-21 NOTE — Telephone Encounter (Signed)
Refill sent as requested. 

## 2022-09-24 ENCOUNTER — Other Ambulatory Visit: Payer: Self-pay | Admitting: Family Medicine

## 2022-09-26 NOTE — Telephone Encounter (Signed)
Last office visit 07/20/2022 for DM.  Last refilled 08/17/2022 for 60 g with no refills.  Next appt: 01/29/2023 for 6 month follow up.

## 2022-09-29 ENCOUNTER — Other Ambulatory Visit: Payer: Self-pay | Admitting: Internal Medicine

## 2022-09-29 DIAGNOSIS — I480 Paroxysmal atrial fibrillation: Secondary | ICD-10-CM

## 2022-09-29 NOTE — Telephone Encounter (Signed)
Prescription refill request for Eliquis received. Indication: Afib  Last office visit: 02/28/22 Nina Carpenter)  Scr: 1.19 (03/24/22)  Age: 77 Weight: 84.6kg  Appropriate dose. Refill sent.

## 2022-11-02 ENCOUNTER — Ambulatory Visit: Payer: Medicare Other | Attending: Internal Medicine

## 2022-11-02 DIAGNOSIS — I495 Sick sinus syndrome: Secondary | ICD-10-CM

## 2022-11-03 LAB — CUP PACEART REMOTE DEVICE CHECK
Battery Remaining Longevity: 9 mo
Battery Voltage: 2.88 V
Brady Statistic AP VP Percent: 0.07 %
Brady Statistic AP VS Percent: 40.38 %
Brady Statistic AS VP Percent: 0.04 %
Brady Statistic AS VS Percent: 59.51 %
Brady Statistic RA Percent Paced: 40.39 %
Brady Statistic RV Percent Paced: 0.1 %
Date Time Interrogation Session: 20241010143446
Implantable Lead Connection Status: 753985
Implantable Lead Connection Status: 753985
Implantable Lead Implant Date: 20141208
Implantable Lead Implant Date: 20141208
Implantable Lead Location: 753859
Implantable Lead Location: 753860
Implantable Lead Model: 5076
Implantable Lead Model: 5076
Implantable Pulse Generator Implant Date: 20141208
Lead Channel Impedance Value: 342 Ohm
Lead Channel Impedance Value: 361 Ohm
Lead Channel Impedance Value: 380 Ohm
Lead Channel Impedance Value: 399 Ohm
Lead Channel Pacing Threshold Amplitude: 0.625 V
Lead Channel Pacing Threshold Amplitude: 0.625 V
Lead Channel Pacing Threshold Pulse Width: 0.4 ms
Lead Channel Pacing Threshold Pulse Width: 0.4 ms
Lead Channel Sensing Intrinsic Amplitude: 2.375 mV
Lead Channel Sensing Intrinsic Amplitude: 2.375 mV
Lead Channel Sensing Intrinsic Amplitude: 6.375 mV
Lead Channel Sensing Intrinsic Amplitude: 6.375 mV
Lead Channel Setting Pacing Amplitude: 2 V
Lead Channel Setting Pacing Amplitude: 2.5 V
Lead Channel Setting Pacing Pulse Width: 0.4 ms
Lead Channel Setting Sensing Sensitivity: 2 mV
Zone Setting Status: 755011
Zone Setting Status: 755011

## 2022-11-16 NOTE — Progress Notes (Signed)
Remote pacemaker transmission.   

## 2022-12-02 ENCOUNTER — Other Ambulatory Visit: Payer: Self-pay | Admitting: Family Medicine

## 2022-12-02 DIAGNOSIS — E1159 Type 2 diabetes mellitus with other circulatory complications: Secondary | ICD-10-CM

## 2022-12-06 ENCOUNTER — Other Ambulatory Visit: Payer: Self-pay | Admitting: Family Medicine

## 2023-01-29 ENCOUNTER — Encounter: Payer: Medicare Other | Admitting: Family Medicine

## 2023-02-01 ENCOUNTER — Ambulatory Visit (INDEPENDENT_AMBULATORY_CARE_PROVIDER_SITE_OTHER): Payer: Medicare Other

## 2023-02-01 DIAGNOSIS — I495 Sick sinus syndrome: Secondary | ICD-10-CM

## 2023-02-05 LAB — CUP PACEART REMOTE DEVICE CHECK
Battery Remaining Longevity: 7 mo
Battery Voltage: 2.86 V
Brady Statistic AP VP Percent: 0.06 %
Brady Statistic AP VS Percent: 31.35 %
Brady Statistic AS VP Percent: 0.04 %
Brady Statistic AS VS Percent: 68.55 %
Brady Statistic RA Percent Paced: 31.37 %
Brady Statistic RV Percent Paced: 0.1 %
Date Time Interrogation Session: 20250113135912
Implantable Lead Connection Status: 753985
Implantable Lead Connection Status: 753985
Implantable Lead Implant Date: 20141208
Implantable Lead Implant Date: 20141208
Implantable Lead Location: 753859
Implantable Lead Location: 753860
Implantable Lead Model: 5076
Implantable Lead Model: 5076
Implantable Pulse Generator Implant Date: 20141208
Lead Channel Impedance Value: 361 Ohm
Lead Channel Impedance Value: 361 Ohm
Lead Channel Impedance Value: 399 Ohm
Lead Channel Impedance Value: 418 Ohm
Lead Channel Pacing Threshold Amplitude: 0.625 V
Lead Channel Pacing Threshold Amplitude: 0.625 V
Lead Channel Pacing Threshold Pulse Width: 0.4 ms
Lead Channel Pacing Threshold Pulse Width: 0.4 ms
Lead Channel Sensing Intrinsic Amplitude: 1.75 mV
Lead Channel Sensing Intrinsic Amplitude: 1.75 mV
Lead Channel Sensing Intrinsic Amplitude: 9.125 mV
Lead Channel Sensing Intrinsic Amplitude: 9.125 mV
Lead Channel Setting Pacing Amplitude: 2 V
Lead Channel Setting Pacing Amplitude: 2.5 V
Lead Channel Setting Pacing Pulse Width: 0.4 ms
Lead Channel Setting Sensing Sensitivity: 2 mV
Zone Setting Status: 755011
Zone Setting Status: 755011

## 2023-02-06 ENCOUNTER — Telehealth: Payer: Self-pay | Admitting: Family Medicine

## 2023-02-06 NOTE — Telephone Encounter (Signed)
 Copied from CRM [id number]. Topic: Medicare AWV >> Feb 06, 2023 10:45 AM Nathanel DEL wrote: Reason for CRM: LVM 02/06/23 to r/s AWV put on wrong sched. New AWV appt 02/27/2023. Please confirm appt [name]  [name]; Care Guide Ambulatory Clinical Support [facility] Direct Dial: [phone]

## 2023-02-08 DIAGNOSIS — H25013 Cortical age-related cataract, bilateral: Secondary | ICD-10-CM | POA: Diagnosis not present

## 2023-02-08 DIAGNOSIS — E113293 Type 2 diabetes mellitus with mild nonproliferative diabetic retinopathy without macular edema, bilateral: Secondary | ICD-10-CM | POA: Diagnosis not present

## 2023-02-08 DIAGNOSIS — H524 Presbyopia: Secondary | ICD-10-CM | POA: Diagnosis not present

## 2023-02-08 DIAGNOSIS — H2513 Age-related nuclear cataract, bilateral: Secondary | ICD-10-CM | POA: Diagnosis not present

## 2023-02-13 ENCOUNTER — Encounter: Payer: Self-pay | Admitting: Family Medicine

## 2023-02-13 NOTE — Progress Notes (Deleted)
Nina Jacquet T. Nolen Lindamood, MD, CAQ Sports Medicine [facility] at [facility] [address] [place], [address]  Phone: [phone]  FAX: [phone]  Nina Carpenter - 78 y.o. female  MRN [id number]  Date of Birth: 01-09-1946  Date: 02/14/2023  PCP: [name], MD  Referral: [name], MD  No chief complaint on file.  Patient Care Team: [name], MD as PCP - General Pernell Dupre, Isabella Bowens, [facility] (Inactive) as Pharmacist (Pharmacist) Subjective:   Nina Carpenter is a 78 y.o. pleasant patient who presents for a medicare wellness examination:  Health Maintenance Summary Reviewed and updated, unless pt declines services.  Tobacco History Reviewed. Non-smoker Alcohol: No concerns, no excessive use Exercise Habits: Some activity, rec at least 30 mins 5 times a week STD concerns: none Drug Use: None Birth control method: n/a Menses regular: n/a Lumps or breast concerns: no Breast Cancer Family History: no  She is a very well-known patient, having been known for many years.  She also on Eliquis for paroxysmal A-fib.  She also has a history of prior stroke.  She does have diabetes that has been fairly well-controlled of recent years.  Diabetes Mellitus: Tolerating Medications: yes Metformin 2000 mg a day.  Glipizide 10 mg a day. Compliance with diet: fair, There is no height or weight on file to calculate BMI. Exercise: minimal / intermittent Avg blood sugars at home: not checking Foot problems: none Hypoglycemia: none No nausea, vomitting, blurred vision, polyuria.  Lab Results  Component Value Date   HGBA1C 6.7 (A) 07/20/2022   HGBA1C 6.2 01/11/2022   HGBA1C 5.8 (A) 12/01/2021   Lab Results  Component Value Date   MICROALBUR 1.3 01/11/2022   LDLCALC 132 (H) 01/11/2022   CREATININE 1.19 03/24/2022    Wt Readings from Last 3 Encounters:  07/20/22 186 lb 8 oz (84.6 kg)  04/13/22 201 lb (91.2 kg)  03/24/22 200 lb (90.7 kg)     HTN: Tolerating all medications without side effects  Hydrochlorothiazide 25 mg, losartan 100 mg, metoprolol 100 mg p.o. twice daily.  Intermittent control with high blood pressure, up and down.  Stable and at goal No CP, no sob. No HA.  BP Readings from Last 3 Encounters:  07/20/22 (!) 160/70  04/13/22 (!) 150/60  03/24/22 (!) 158/80    Basic Metabolic Panel:    Component Value Date/Time   NA 140 03/24/2022 0918   NA 140 11/23/2020 1134   NA 139 05/09/2013 2023   K 4.4 03/24/2022 0918   K 3.9 05/09/2013 2023   CL 104 03/24/2022 0918   CL 103 05/09/2013 2023   CO2 27 03/24/2022 0918   CO2 29 05/09/2013 2023   BUN 21 03/24/2022 0918   BUN 29 (H) 11/23/2020 1134   BUN 14 05/09/2013 2023   CREATININE 1.19 03/24/2022 0918   CREATININE 0.97 05/09/2013 2023   GLUCOSE 109 (H) 03/24/2022 0918   GLUCOSE 141 (H) 05/09/2013 2023   CALCIUM 9.9 03/24/2022 0918   CALCIUM 9.5 05/09/2013 2023    Lipids: Doing well, stable. Tolerating meds fine with no SE. Panel reviewed with patient.  Lipids: Lab Results  Component Value Date   CHOL 202 (H) 01/11/2022   Lab Results  Component Value Date   HDL 42.50 01/11/2022   Lab Results  Component Value Date   LDLCALC 132 (H) 01/11/2022   Lab Results  Component Value Date   TRIG 134.0 01/11/2022   Lab Results  Component Value Date  CHOLHDL 5 01/11/2022    Lab Results  Component Value Date   ALT 12 01/11/2022   AST 14 01/11/2022   ALKPHOS 82 01/11/2022   BILITOT 0.7 01/11/2022     Health Maintenance  Topic Date Due   DEXA SCAN  Never done   COVID-19 Vaccine (3 - Pfizer risk series) 04/29/2019   Zoster Vaccines- Shingrix (2 of 2) 04/13/2022   INFLUENZA VACCINE  08/24/2022   Diabetic kidney evaluation - Urine ACR  01/12/2023   Medicare Annual Wellness (AWV)  02/10/2023   FOOT EXAM  01/19/2023   HEMOGLOBIN A1C  01/19/2023   OPHTHALMOLOGY EXAM  01/19/2023   Diabetic kidney evaluation - eGFR measurement  03/24/2023    DTaP/Tdap/Td (2 - Td or Tdap) 02/17/2032   Pneumonia Vaccine 58+ Years old  Completed   Hepatitis C Screening  Completed   HPV VACCINES  Aged Out   Colonoscopy  Discontinued   Fecal DNA (Cologuard)  Discontinued   Immunization History  Administered Date(s) Administered   Fluad Quad(high Dose 65+) 01/05/2020, 01/03/2021   Influenza Split 11/13/2011   Influenza, High Dose Seasonal PF 11/23/2016   Influenza,inj,Quad PF,6+ Mos 11/18/2012, 10/06/2013, 12/23/2014, 03/20/2016, 01/28/2018   PFIZER(Purple Top)SARS-COV-2 Vaccination 03/07/2019, 04/01/2019   Pneumococcal Conjugate-13 10/06/2013   Pneumococcal Polysaccharide-23 10/11/2010   Tdap 02/16/2022   Zoster Recombinant(Shingrix) 02/16/2022    Patient Active Problem List   Diagnosis Date Noted   History of stroke 01/06/2013    Priority: High   Atrial fibrillation (HCC) 12/29/2012    Priority: High   Type 2 diabetes mellitus with vascular disease (HCC) 05/04/2008    Priority: High   OSA (obstructive sleep apnea) 05/25/2014    Priority: Medium    Cardiac pacemaker in situ 01/06/2013    Priority: Medium    Tachy-brady syndrome (HCC) 12/29/2012    Priority: Medium    Hx of rheumatic fever 08/07/2011    Priority: Medium    NEURALGIA, TRIGEMINAL 01/26/2010    Priority: Medium    Hyperlipidemia LDL goal <70 05/04/2008    Priority: Medium    Essential hypertension 05/04/2008    Priority: Medium    Asthma 05/04/2008    Priority: Medium    Allergic dermatitis 03/23/2022   Peripheral edema 10/14/2012   GERD 05/04/2008    Past Medical History:  Diagnosis Date   Acid reflux disease    Asthma    a. Remotely.   Chronic combined systolic and diastolic CHF (congestive heart failure) (HCC)    a. Felt related to AF. EF 45-50% by echo 12/2012.   CVA (cerebral vascular accident) (HCC)    a. Thromboemoblic L MCA CVA 12/2012 with no residual deficit except mild occasional word finding. b. 1-39% RICA/LICA by dopplers 12/29/12.   GERD  (gastroesophageal reflux disease)    H/O medication noncompliance    a. Per PCP note 01/06/13: Previously refused anything greater than aspirin, refused treatment for DM, and often stopped hypertensive meds and lipid meds.   Hx of rheumatic fever    a. As a child.   Hyperlipidemia    Hypertension    Kidney stones    a. Remotely.   PAF (paroxysmal atrial fibrillation) (HCC)    a. Dx 09/2012, chronicity unclear at that time. Initially refused Xarelto due to risk of bleeding. b. Sustained CVA 12/2012 - agreeable to taking Eliquis. Tachybrady syndrome during that admission s/p Medtronic PPM implantation;  c. Failed flecainide, sotalol, amiodarone (itching), and propafenone.   Tachy-brady syndrome (HCC)    a.  AF RVR with pauses >5sec requiring Medtronic PPM 12/2012.   Tubular adenoma of colon 05/2008   Type 2 diabetes mellitus (HCC)     Past Surgical History:  Procedure Laterality Date   CHOLECYSTECTOMY     DESTRUCTION TRIGEMINAL NERVE VIA NEUROLYTIC AGENT  NOv @013    Gamma Knife    ENDOMETRIAL ABLATION     INSERT / REPLACE / REMOVE PACEMAKER     PACEMAKER INSERTION  12-30-12   MDT dual chamber pacemaker implanted by Dr [name] for tachy-brady syndrome   PERMANENT PACEMAKER INSERTION N/A 12/30/2012   Procedure: PERMANENT PACEMAKER INSERTION;  Surgeon: Duke Salvia, MD;  Location: [facility] CATH LAB;  Service: Cardiovascular;  Laterality: N/A;   TUBAL LIGATION     ectopic preganacy     Family History  Problem Relation Age of Onset   Stroke Mother    Diabetes Mother    Hypertension Mother     Social History   Social History Narrative   Lives in [place] with her husband.  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    Past Medical History, Surgical History, Social History, Family History, Problem List, Medications, and Allergies have been reviewed and updated if relevant.  Review of Systems: Pertinent positives are listed above.  Otherwise, a full 14 point review of systems has been  done in full and it is negative except where it is noted positive.  Objective:   There were no vitals taken for this visit.    01/05/2020    9:28 AM 01/03/2021   10:51 AM 02/08/2021    9:51 AM 02/09/2022    9:10 AM 02/10/2022   11:54 AM  Fall Risk  Falls in the past year? 1 0 0 0 0  Was there an injury with Fall? 0  0 0 0  Fall Risk Category Calculator 1  0 0 0  Fall Risk Category (Retired) Low  Low    (RETIRED) Patient Fall Risk Level Low fall risk  Low fall risk    Patient at Risk for Falls Due to No Fall Risks  No Fall Risks  No Fall Risks  Fall risk Follow up Falls evaluation completed  Falls prevention discussed Falls evaluation completed;Falls prevention discussed Falls evaluation completed   Ideal Body Weight:   No results found.    02/09/2022    9:14 AM 02/08/2021    9:52 AM 01/03/2021   10:51 AM 01/05/2020    9:27 AM 07/15/2018   11:07 AM  Depression screen PHQ 2/9  Decreased Interest 0 0 0 0 0  Down, Depressed, Hopeless 0 0 0 0 0  PHQ - 2 Score 0 0 0 0 0  Altered sleeping     0  Tired, decreased energy     0  Change in appetite     0  Feeling bad or failure about yourself      0  Trouble concentrating     0  Moving slowly or fidgety/restless     0  Suicidal thoughts     0  PHQ-9 Score     0  Difficult doing work/chores     Not difficult at all     GEN: well developed, well nourished, no acute distress Eyes: conjunctiva and lids normal, PERRLA, EOMI ENT: TM clear, nares clear, oral exam WNL Neck: supple, no lymphadenopathy, no thyromegaly, no JVD Pulm: clear to auscultation and percussion, respiratory effort normal CV: regular rate and rhythm, S1-S2, no murmur, rub or gallop, no bruits  Chest: no scars, masses, no lumps BREAST: no lumps, no axillary LAD, no nipple discharge GI: soft, non-tender; no hepatosplenomegaly, masses; active bowel sounds all quadrants GU: Normal external female genitalia. Cervix appears intact without lesions or irritation. Vaginal  canal normal without ulceration or lesion. Cervix NT to exam. Ovaries neither enlarged nor tender. (Chaperoned examination by female staff) Lymph: no cervical, axillary or inguinal adenopathy MSK: gait normal, muscle tone and strength WNL, no joint swelling, effusions, discoloration, crepitus  SKIN: clear, good turgor, color WNL, no rashes, lesions, or ulcerations Neuro: normal mental status, normal strength, sensation, and motion Psych: alert; oriented to person, place and time, normally interactive and not anxious or depressed in appearance.  All labs reviewed with patient. Lab Review:     Latest Ref Rng & Units 01/11/2022    8:54 AM 01/03/2021   11:45 AM 11/23/2020   11:34 AM  CBC EXTENDED  WBC 4.0 - 10.5 K/uL 5.6  4.8  6.4   RBC 3.87 - 5.11 Mil/uL 4.13  4.06  4.15   Hemoglobin 12.0 - 15.0 g/dL 28.4  13.2  44.0   HCT 36.0 - 46.0 % 38.1  36.9  37.9   Platelets 150.0 - 400.0 K/uL 216.0  243.0  240   NEUT# 1.4 - 7.7 K/uL 3.4  3.0    Lymph# 0.7 - 4.0 K/uL 1.5  1.2         Latest Ref Rng & Units 03/24/2022    9:18 AM 01/11/2022    8:54 AM 01/03/2021   11:45 AM  BMP  Glucose 70 - 99 mg/dL 102  725  366   BUN 6 - 23 mg/dL 21  20  25    Creatinine 0.40 - 1.20 mg/dL 4.40  3.47  4.25   Sodium 135 - 145 mEq/L 140  139  140   Potassium 3.5 - 5.1 mEq/L 4.4  4.8  4.9   Chloride 96 - 112 mEq/L 104  104  105   CO2 19 - 32 mEq/L 27  27  25    Calcium 8.4 - 10.5 mg/dL 9.9  9.6  9.7        Latest Ref Rng & Units 01/11/2022    8:54 AM 01/03/2021   11:45 AM 12/29/2019   10:09 AM  Hepatic Function  Total Protein 6.0 - 8.3 g/dL 6.6  6.9  7.0   Albumin 3.5 - 5.2 g/dL 4.4  4.3  4.4   AST 0 - 37 U/L 14  15  15    ALT 0 - 35 U/L 12  16  14    Alk Phosphatase 39 - 117 U/L 82  66  65   Total Bilirubin 0.2 - 1.2 mg/dL 0.7  0.6  0.6   Bilirubin, Direct 0.0 - 0.3 mg/dL 0.1  0.1  0.1     Lab Results  Component Value Date   CHOL 202 (H) 01/11/2022   Lab Results  Component Value Date   HDL  42.50 01/11/2022   Lab Results  Component Value Date   LDLCALC 132 (H) 01/11/2022   Lab Results  Component Value Date   TRIG 134.0 01/11/2022   Lab Results  Component Value Date   CHOLHDL 5 01/11/2022   No results for input(s): "PSA" in the last 72 hours. Lab Results  Component Value Date   HCVAB NEGATIVE 03/13/2016   Lab Results  Component Value Date   VD25OH 28.91 (L) 01/11/2022   VD25OH 26.84 (L) 01/03/2021   VD25OH 28.65 (L) 12/29/2019  Lab Results  Component Value Date   HGBA1C 6.7 (A) 07/20/2022   HGBA1C 6.2 01/11/2022   HGBA1C 5.8 (A) 12/01/2021   Lab Results  Component Value Date   MICROALBUR 1.3 01/11/2022   LDLCALC 132 (H) 01/11/2022   CREATININE 1.19 03/24/2022     Assessment and Plan:     ICD-10-CM   1. Healthcare maintenance  Z00.00       Health Maintenance Exam: The patient's preventative maintenance and recommended screening tests for an annual wellness exam were reviewed in full today. Brought up to date unless services declined.  Counselled on the importance of diet, exercise, and its role in overall health and mortality. The patient's FH and SH was reviewed, including their home life, tobacco status, and drug and alcohol status.  Follow-up in 1 year for physical exam or additional follow-up below.  I have personally reviewed the Medicare Annual Wellness questionnaire and have noted 1. The patient's medical and social history 2. Their use of alcohol, tobacco or illicit drugs 3. Their current medications and supplements 4. The patient's functional ability including ADL's, fall risks, home safety risks and hearing or visual             impairment. 5. Diet and physical activities 6. Evidence for depression or mood disorders 7. Reviewed Updated provider list, see scanned forms and [facility] Snapshot.  8. Reviewed whether or not the patient has HCPOA or living will, and discussed what this means with the patient.  Recommended she bring in a  copy for his chart in [facility].  The patients weight, height, BMI and visual acuity have been recorded in the chart I have made referrals, counseling and provided education to the patient based review of the above and I have provided the pt with a written personalized care plan for preventive services.  I have provided the patient with a copy of your personalized plan for preventive services. Instructed to take the time to review along with their updated medication list.  Disposition: No follow-ups on file.  Future Appointments  Date Time Provider Department Center  02/14/2023 10:20 AM [name], MD [facility]  02/27/2023  1:10 PM [facility] ANNUAL WELLNESS VISIT 2 [facility]  05/03/2023  2:45 PM [facility] DEVICE REMOTES [facility]  08/02/2023  2:45 PM [facility] DEVICE REMOTES [facility]  11/01/2023  2:45 PM [facility] DEVICE REMOTES [facility]  01/31/2024  2:45 PM [facility] DEVICE REMOTES [facility]  05/01/2024  2:45 PM [facility] DEVICE REMOTES [facility]    No orders of the defined types were placed in this encounter.  There are no discontinued medications. No orders of the defined types were placed in this encounter.   Signed,  Elpidio Galea. Ahmya Bernick, MD   Allergies as of 02/14/2023       Reactions   Bee Venom Anaphylaxis   Fish Allergy Swelling   Shell fish   Penicillins Hives, Itching   Amiodarone Itching   Iodine Itching   itching   Ace Inhibitors Cough   Januvia [sitagliptin] Rash   Lidocaine Hcl Palpitations   ALL CAINES given during dental procedures   Lipitor [atorvastatin] Other (See Comments)   myalgias        Medication List        Accurate as of February 13, 2023  5:39 PM. If you have any questions, ask your nurse or doctor.          Accu-Chek [employer] Kit Use to check blood sugar once daily  Accu-Chek FastClix Lancets Misc USE TO CHECK BLOOD SUGAR ONCE DAILY    Accu-Chek Guide Control Liqd 1 each by In Vitro route as directed.   Accu-Chek Guide Me w/Device Kit 1 each by Does not apply route every 14 (fourteen) days. Use to check FBS and 2 HR PP once every 2 weeks   Accu-Chek Guide test strip Generic drug: glucose blood USE TO CHECK BLOOD SUGAR ONCE DAILY   augmented betamethasone dipropionate 0.05 % ointment Commonly known as: DIPROLENE-AF Apply topically daily as needed.   clobetasol 0.05 % external solution Commonly known as: TEMOVATE Apply topically daily.   doxycycline 100 MG capsule Commonly known as: VIBRAMYCIN Take 100 mg by mouth 2 (two) times daily.   Eliquis 5 MG Tabs tablet Generic drug: apixaban TAKE 1 TABLET(5 MG) BY MOUTH TWICE DAILY   EPINEPHrine 0.3 mg/0.3 mL Soaj injection Commonly known as: EPI-PEN Inject 0.3 mg into the muscle as needed.   fexofenadine 180 MG tablet Commonly known as: ALLEGRA Take 180 mg by mouth daily.   furosemide 20 MG tablet Commonly known as: LASIX Take 1 tablet (20 mg) by mouth once every other day   glipiZIDE 10 MG 24 hr tablet Commonly known as: glipiZIDE XL Take 1 tablet (10 mg total) by mouth daily with breakfast.   hydrochlorothiazide 25 MG tablet Commonly known as: HYDRODIURIL Take 25 mg by mouth daily.   losartan 100 MG tablet Commonly known as: COZAAR TAKE 1 TABLET(100 MG) BY MOUTH DAILY   metFORMIN 500 MG 24 hr tablet Commonly known as: GLUCOPHAGE-XR TAKE 4 TABLETS(2000 MG) BY MOUTH DAILY WITH BREAKFAST   metoprolol tartrate 100 MG tablet Commonly known as: LOPRESSOR TAKE 1 TABLET(100 MG) BY MOUTH TWICE DAILY   mometasone 50 MCG/ACT nasal spray Commonly known as: NASONEX SHAKE LIQUID AND USE 2 SPRAYS IN EACH NOSTRIL DAILY AS NEEDED   montelukast 10 MG tablet Commonly known as: SINGULAIR Take 1 tablet (10 mg total) by mouth at bedtime as needed.   triamcinolone ointment 0.5 % Commonly known as: KENALOG APPLY TOPICALLY TO THE AFFECTED AREA TWICE DAILY

## 2023-02-14 ENCOUNTER — Encounter: Payer: Medicare Other | Admitting: Family Medicine

## 2023-02-14 ENCOUNTER — Telehealth: Payer: Self-pay | Admitting: *Deleted

## 2023-02-14 DIAGNOSIS — E785 Hyperlipidemia, unspecified: Secondary | ICD-10-CM

## 2023-02-14 DIAGNOSIS — I480 Paroxysmal atrial fibrillation: Secondary | ICD-10-CM

## 2023-02-14 DIAGNOSIS — E559 Vitamin D deficiency, unspecified: Secondary | ICD-10-CM

## 2023-02-14 DIAGNOSIS — Z0000 Encounter for general adult medical examination without abnormal findings: Secondary | ICD-10-CM

## 2023-02-14 DIAGNOSIS — I10 Essential (primary) hypertension: Secondary | ICD-10-CM

## 2023-02-14 DIAGNOSIS — Z8673 Personal history of transient ischemic attack (TIA), and cerebral infarction without residual deficits: Secondary | ICD-10-CM

## 2023-02-14 DIAGNOSIS — E1159 Type 2 diabetes mellitus with other circulatory complications: Secondary | ICD-10-CM

## 2023-02-14 DIAGNOSIS — Z79899 Other long term (current) drug therapy: Secondary | ICD-10-CM

## 2023-02-14 NOTE — Telephone Encounter (Signed)
-----   Message from Lovena Neighbours sent at 02/14/2023  2:02 PM EST ----- Regarding: Lab orders for Monday 1.27.25 Please put physical lab orders in future. Thank you, Denny Peon

## 2023-02-15 LAB — HM DIABETES EYE EXAM

## 2023-02-16 ENCOUNTER — Other Ambulatory Visit: Payer: Medicare Other

## 2023-02-19 ENCOUNTER — Other Ambulatory Visit (INDEPENDENT_AMBULATORY_CARE_PROVIDER_SITE_OTHER): Payer: Medicare Other

## 2023-02-19 ENCOUNTER — Encounter: Payer: Self-pay | Admitting: Family Medicine

## 2023-02-19 DIAGNOSIS — Z79899 Other long term (current) drug therapy: Secondary | ICD-10-CM | POA: Diagnosis not present

## 2023-02-19 DIAGNOSIS — E559 Vitamin D deficiency, unspecified: Secondary | ICD-10-CM | POA: Diagnosis not present

## 2023-02-19 DIAGNOSIS — E785 Hyperlipidemia, unspecified: Secondary | ICD-10-CM | POA: Diagnosis not present

## 2023-02-19 DIAGNOSIS — E1159 Type 2 diabetes mellitus with other circulatory complications: Secondary | ICD-10-CM | POA: Diagnosis not present

## 2023-02-19 LAB — MICROALBUMIN / CREATININE URINE RATIO
Creatinine,U: 114.4 mg/dL
Microalb Creat Ratio: 1.9 mg/g (ref 0.0–30.0)
Microalb, Ur: 2.1 mg/dL — ABNORMAL HIGH (ref 0.0–1.9)

## 2023-02-19 LAB — BASIC METABOLIC PANEL
BUN: 21 mg/dL (ref 6–23)
CO2: 28 meq/L (ref 19–32)
Calcium: 9.3 mg/dL (ref 8.4–10.5)
Chloride: 102 meq/L (ref 96–112)
Creatinine, Ser: 1.36 mg/dL — ABNORMAL HIGH (ref 0.40–1.20)
GFR: 37.56 mL/min — ABNORMAL LOW (ref >60.00)
Glucose, Bld: 134 mg/dL — ABNORMAL HIGH (ref 70–99)
Potassium: 4.9 meq/L (ref 3.5–5.1)
Sodium: 138 meq/L (ref 135–145)

## 2023-02-19 LAB — HEPATIC FUNCTION PANEL
ALT: 14 U/L (ref 0–35)
AST: 15 U/L (ref 0–37)
Albumin: 4.3 g/dL (ref 3.5–5.2)
Alkaline Phosphatase: 95 U/L (ref 39–117)
Bilirubin, Direct: 0.1 mg/dL (ref 0.0–0.3)
Total Bilirubin: 0.6 mg/dL (ref 0.2–1.2)
Total Protein: 6.7 g/dL (ref 6.0–8.3)

## 2023-02-19 LAB — VITAMIN D 25 HYDROXY (VIT D DEFICIENCY, FRACTURES): VITD: 21.07 ng/mL — ABNORMAL LOW (ref 30.00–100.00)

## 2023-02-19 LAB — TSH: TSH: 3 u[IU]/mL (ref 0.35–5.50)

## 2023-02-19 LAB — CBC WITH DIFFERENTIAL/PLATELET
Basophils Absolute: 0.1 10*3/uL (ref 0.0–0.1)
Basophils Relative: 1.4 % (ref 0.0–3.0)
Eosinophils Absolute: 0.3 10*3/uL (ref 0.0–0.7)
Eosinophils Relative: 3.5 % (ref 0.0–5.0)
HCT: 43.4 % (ref 36.0–46.0)
Hemoglobin: 14.2 g/dL (ref 12.0–15.0)
Lymphocytes Relative: 29 % (ref 12.0–46.0)
Lymphs Abs: 2.1 10*3/uL (ref 0.7–4.0)
MCHC: 32.7 g/dL (ref 30.0–36.0)
MCV: 91.3 fL (ref 78.0–100.0)
Monocytes Absolute: 0.6 10*3/uL (ref 0.1–1.0)
Monocytes Relative: 8.8 % (ref 3.0–12.0)
Neutro Abs: 4.2 10*3/uL (ref 1.4–7.7)
Neutrophils Relative %: 57.3 % (ref 43.0–77.0)
Platelets: 258 10*3/uL (ref 150.0–400.0)
RBC: 4.76 Mil/uL (ref 3.87–5.11)
RDW: 14.1 % (ref 11.5–15.5)
WBC: 7.2 10*3/uL (ref 4.0–10.5)

## 2023-02-19 LAB — LIPID PANEL
Cholesterol: 245 mg/dL — ABNORMAL HIGH (ref 0–200)
HDL: 46.3 mg/dL (ref >39.00)
LDL Cholesterol: 154 mg/dL — ABNORMAL HIGH (ref 0–99)
NonHDL: 198.22
Total CHOL/HDL Ratio: 5
Triglycerides: 221 mg/dL — ABNORMAL HIGH (ref 0.0–149.0)
VLDL: 44.2 mg/dL — ABNORMAL HIGH (ref 0.0–40.0)

## 2023-02-19 LAB — HEMOGLOBIN A1C: Hgb A1c MFr Bld: 7 % — ABNORMAL HIGH (ref 4.6–6.5)

## 2023-02-20 NOTE — Progress Notes (Unsigned)
Nina Oppedisano T. Allyah Heather, MD, CAQ Sports Medicine [facility] at [facility] [address] [place], [address]  Phone: [phone]  FAX: [phone]  Nina Carpenter - 78 y.o. female  MRN [id number]  Date of Birth: [birth year]  Date: 02/21/2023  PCP: [name], MD  Referral: [name], MD  No chief complaint on file.  Patient Care Team: [name], MD as PCP - General Pernell Dupre, Isabella Bowens, [facility] (Inactive) as Pharmacist (Pharmacist) Subjective:   Nina Carpenter is a 78 y.o. pleasant patient who presents for a medicare wellness examination:  Health Maintenance Summary Reviewed and updated, unless pt declines services.  Tobacco History Reviewed. Non-smoker Alcohol: No concerns, no excessive use Exercise Habits: Some activity, rec at least 30 mins 5 times a week STD concerns: none Drug Use: None Birth control method: n/a Menses regular: n/a Lumps or breast concerns: no Breast Cancer Family History: no  Patient does have a significant history of prior stroke, paroxysmal A-fib, type 2 diabetes, history of rheumatic fever, hypertension, as well as a cardiac pacemaker for tachybradycardia syndrome.  Diabetes Mellitus: Tolerating Medications: yes Compliance with diet: fair, There is no height or weight on file to calculate BMI. Exercise: minimal / intermittent Avg blood sugars at home: not checking Foot problems: none Hypoglycemia: none No nausea, vomitting, blurred vision, polyuria.  Lab Results  Component Value Date   HGBA1C 7.0 (H) 02/19/2023   HGBA1C 6.7 (A) 07/20/2022   HGBA1C 6.2 01/11/2022   Lab Results  Component Value Date   MICROALBUR 2.1 (H) 02/19/2023   LDLCALC 154 (H) 02/19/2023   CREATININE 1.36 (H) 02/19/2023    Wt Readings from Last 3 Encounters:  07/20/22 186 lb 8 oz (84.6 kg)  04/13/22 201 lb (91.2 kg)  03/24/22 200 lb (90.7 kg)     Health Maintenance  Topic Date Due   DEXA SCAN  Never done   COVID-19  Vaccine (3 - Pfizer risk series) 04/29/2019   Zoster Vaccines- Shingrix (2 of 2) 04/13/2022   INFLUENZA VACCINE  08/24/2022   FOOT EXAM  01/19/2023   Medicare Annual Wellness (AWV)  02/10/2023   HEMOGLOBIN A1C  08/19/2023   OPHTHALMOLOGY EXAM  02/15/2024   Diabetic kidney evaluation - eGFR measurement  02/19/2024   Diabetic kidney evaluation - Urine ACR  02/19/2024   DTaP/Tdap/Td (2 - Td or Tdap) 02/17/2032   Pneumonia Vaccine 90+ Years old  Completed   Hepatitis C Screening  Completed   HPV VACCINES  Aged Out   Colonoscopy  Discontinued   Fecal DNA (Cologuard)  Discontinued   Immunization History  Administered Date(s) Administered   Fluad Quad(high Dose 65+) 01/05/2020, 01/03/2021   Influenza Split 11/13/2011   Influenza, High Dose Seasonal PF 11/23/2016   Influenza,inj,Quad PF,6+ Mos 11/18/2012, 10/06/2013, 12/23/2014, 03/20/2016, 01/28/2018   PFIZER(Purple Top)SARS-COV-2 Vaccination 03/07/2019, 04/01/2019   Pneumococcal Conjugate-13 10/06/2013   Pneumococcal Polysaccharide-23 10/11/2010   Tdap 02/16/2022   Zoster Recombinant(Shingrix) 02/16/2022    Patient Active Problem List   Diagnosis Date Noted   History of stroke 01/06/2013    Priority: High   Atrial fibrillation (HCC) 12/29/2012    Priority: High   Type 2 diabetes mellitus with vascular disease (HCC) 05/04/2008    Priority: High   OSA (obstructive sleep apnea) 05/25/2014    Priority: Medium    Cardiac pacemaker in situ 01/06/2013    Priority: Medium    Tachy-brady syndrome (HCC) 12/29/2012    Priority: Medium    Hx of rheumatic  fever 08/07/2011    Priority: Medium    NEURALGIA, TRIGEMINAL 01/26/2010    Priority: Medium    Hyperlipidemia LDL goal <70 05/04/2008    Priority: Medium    Essential hypertension 05/04/2008    Priority: Medium    Asthma 05/04/2008    Priority: Medium    Allergic dermatitis 03/23/2022   Peripheral edema 10/14/2012   GERD 05/04/2008    Past Medical History:  Diagnosis Date    Acid reflux disease    Chronic combined systolic and diastolic CHF (congestive heart failure) (HCC)    a. Felt related to AF. EF 45-50% by echo 12/2012.   CVA (cerebral vascular accident) (HCC)    a. Thromboemoblic L MCA CVA 12/2012 with no residual deficit except mild occasional word finding. b. 1-39% RICA/LICA by dopplers 12/29/12.   GERD (gastroesophageal reflux disease)    Hx of rheumatic fever    a. As a child.   Hyperlipidemia    Hypertension    Kidney stones    a. Remotely.   PAF (paroxysmal atrial fibrillation) (HCC)    a. Dx 09/2012, chronicity unclear at that time. Initially refused Xarelto due to risk of bleeding. b. Sustained CVA 12/2012 - agreeable to taking Eliquis. Tachybrady syndrome during that admission s/p Medtronic PPM implantation;  c. Failed flecainide, sotalol, amiodarone (itching), and propafenone.   Tachy-brady syndrome (HCC)    a. AF RVR with pauses >5sec requiring Medtronic PPM 12/2012.   Tubular adenoma of colon 05/2008   Type 2 diabetes mellitus (HCC)     Past Surgical History:  Procedure Laterality Date   CHOLECYSTECTOMY     DESTRUCTION TRIGEMINAL NERVE VIA NEUROLYTIC AGENT  NOv @013    Gamma Knife    ENDOMETRIAL ABLATION     INSERT / REPLACE / REMOVE PACEMAKER     PACEMAKER INSERTION  12-30-12   MDT dual chamber pacemaker implanted by Dr [name] for tachy-brady syndrome   PERMANENT PACEMAKER INSERTION N/A 12/30/2012   Procedure: PERMANENT PACEMAKER INSERTION;  Surgeon: Duke Salvia, MD;  [facility] LAB;  Service: Cardiovascular;  Laterality: N/A;   TUBAL LIGATION     ectopic preganacy     Family History  Problem Relation Age of Onset   Stroke Mother    Diabetes Mother    Hypertension Mother     Social History   Social History Narrative   Lives in [place] with her husband.  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    Past Medical History, Surgical History, Social History, Family History, Problem List, Medications, and Allergies have  been reviewed and updated if relevant.  Review of Systems: Pertinent positives are listed above.  Otherwise, a full 14 point review of systems has been done in full and it is negative except where it is noted positive.  Objective:   There were no vitals taken for this visit.    01/05/2020    9:28 AM 01/03/2021   10:51 AM 02/08/2021    9:51 AM 02/09/2022    9:10 AM 02/10/2022   11:54 AM  Fall Risk  Falls in the past year? 1 0 0 0 0  Was there an injury with Fall? 0  0 0 0  Fall Risk Category Calculator 1  0 0 0  Fall Risk Category (Retired) Low  Low    (RETIRED) Patient Fall Risk Level Low fall risk  Low fall risk    Patient at Risk for Falls Due to No Fall Risks  No Fall Risks  No Fall Risks  Fall risk Follow up Falls evaluation completed  Falls prevention discussed Falls evaluation completed;Falls prevention discussed Falls evaluation completed   Ideal Body Weight:   No results found.    02/09/2022    9:14 AM 02/08/2021    9:52 AM 01/03/2021   10:51 AM 01/05/2020    9:27 AM 07/15/2018   11:07 AM  Depression screen PHQ 2/9  Decreased Interest 0 0 0 0 0  Down, Depressed, Hopeless 0 0 0 0 0  PHQ - 2 Score 0 0 0 0 0  Altered sleeping     0  Tired, decreased energy     0  Change in appetite     0  Feeling bad or failure about yourself      0  Trouble concentrating     0  Moving slowly or fidgety/restless     0  Suicidal thoughts     0  PHQ-9 Score     0  Difficult doing work/chores     Not difficult at all     GEN: well developed, well nourished, no acute distress Eyes: conjunctiva and lids normal, PERRLA, EOMI ENT: TM clear, nares clear, oral exam WNL Neck: supple, no lymphadenopathy, no thyromegaly, no JVD Pulm: clear to auscultation and percussion, respiratory effort normal CV: regular rate and rhythm, S1-S2, no murmur, rub or gallop, no bruits Chest: no scars, masses, no lumps BREAST: no lumps, no axillary LAD, no nipple discharge GI: soft, non-tender; no  hepatosplenomegaly, masses; active bowel sounds all quadrants GU: Normal external female genitalia. Cervix appears intact without lesions or irritation. Vaginal canal normal without ulceration or lesion. Cervix NT to exam. Ovaries neither enlarged nor tender. (Chaperoned examination by female staff) Lymph: no cervical, axillary or inguinal adenopathy MSK: gait normal, muscle tone and strength WNL, no joint swelling, effusions, discoloration, crepitus  SKIN: clear, good turgor, color WNL, no rashes, lesions, or ulcerations Neuro: normal mental status, normal strength, sensation, and motion Psych: alert; oriented to person, place and time, normally interactive and not anxious or depressed in appearance.  All labs reviewed with patient.  Results for orders placed or performed in visit on 02/19/23  HM DIABETES EYE EXAM   Collection Time: 02/15/23 12:00 AM  Result Value Ref Range   HM Diabetic Eye Exam Retinopathy (A) No Retinopathy    CUP PACEART REMOTE DEVICE CHECK Result Date: 02/05/2023 Scheduled remote reviewed. Normal device function.  Battery estimate: 7 months. 9 NSVT classified episodes, EGMs c/w 1:1 atrial driven tachycardia. Next remote 91 days. KS, CVRS   Assessment and Plan:     ICD-10-CM   1. Healthcare maintenance  Z00.00       Health Maintenance Exam: The patient's preventative maintenance and recommended screening tests for an annual wellness exam were reviewed in full today. Brought up to date unless services declined.  Counselled on the importance of diet, exercise, and its role in overall health and mortality. The patient's FH and SH was reviewed, including their home life, tobacco status, and drug and alcohol status.  Follow-up in 1 year for physical exam or additional follow-up below.  I have personally reviewed the Medicare Annual Wellness questionnaire and have noted 1. The patient's medical and social history 2. Their use of alcohol, tobacco or illicit  drugs 3. Their current medications and supplements 4. The patient's functional ability including ADL's, fall risks, home safety risks and hearing or visual  impairment. 5. Diet and physical activities 6. Evidence for depression or mood disorders 7. Reviewed Updated provider list, see scanned forms and [facility] Snapshot.  8. Reviewed whether or not the patient has HCPOA or living will, and discussed what this means with the patient.  Recommended she bring in a copy for his chart in [facility].  The patients weight, height, BMI and visual acuity have been recorded in the chart I have made referrals, counseling and provided education to the patient based review of the above and I have provided the pt with a written personalized care plan for preventive services.  I have provided the patient with a copy of your personalized plan for preventive services. Instructed to take the time to review along with their updated medication list.  Disposition: No follow-ups on file.  Future Appointments  Date Time Provider Department Center  02/21/2023  2:20 PM [name], MD [facility]  05/03/2023  2:45 PM [facility] DEVICE REMOTES [facility]  08/02/2023  2:45 PM [facility] DEVICE REMOTES [facility]  11/01/2023  2:45 PM [facility] DEVICE REMOTES [facility]  01/31/2024  2:45 PM [facility] DEVICE REMOTES [facility]  05/01/2024  2:45 PM [facility] DEVICE REMOTES [facility]    No orders of the defined types were placed in this encounter.  There are no discontinued medications. No orders of the defined types were placed in this encounter.   Signed,  Elpidio Galea. Tracy Gerken, MD   Allergies as of 02/21/2023       Reactions   Bee Venom Anaphylaxis   Fish Allergy Swelling   Shell fish   Penicillins Hives, Itching   Amiodarone Itching   Iodine Itching   itching   Ace Inhibitors Cough   Januvia [sitagliptin] Rash   Lidocaine Hcl  Palpitations   ALL CAINES given during dental procedures   Lipitor [atorvastatin] Other (See Comments)   myalgias        Medication List        Accurate as of February 20, 2023 12:58 PM. If you have any questions, ask your nurse or doctor.          Accu-Chek [employer] Kit Use to check blood sugar once daily   Accu-Chek FastClix Lancets Misc USE TO CHECK BLOOD SUGAR ONCE DAILY   Accu-Chek Guide Control Liqd 1 each by In Vitro route as directed.   Accu-Chek Guide Me w/Device Kit 1 each by Does not apply route every 14 (fourteen) days. Use to check FBS and 2 HR PP once every 2 weeks   Accu-Chek Guide test strip Generic drug: glucose blood USE TO CHECK BLOOD SUGAR ONCE DAILY   augmented betamethasone dipropionate 0.05 % ointment Commonly known as: DIPROLENE-AF Apply topically daily as needed.   clobetasol 0.05 % external solution Commonly known as: TEMOVATE Apply topically daily.   Eliquis 5 MG Tabs tablet Generic drug: apixaban TAKE 1 TABLET(5 MG) BY MOUTH TWICE DAILY   EPINEPHrine 0.3 mg/0.3 mL Soaj injection Commonly known as: EPI-PEN Inject 0.3 mg into the muscle as needed.   fexofenadine 180 MG tablet Commonly known as: ALLEGRA Take 180 mg by mouth daily.   furosemide 20 MG tablet Commonly known as: LASIX Take 1 tablet (20 mg) by mouth once every other day   glipiZIDE 10 MG 24 hr tablet Commonly known as: glipiZIDE XL Take 1 tablet (10 mg total) by mouth daily with breakfast.   hydrochlorothiazide 25 MG tablet Commonly known as: HYDRODIURIL Take 25 mg by mouth daily.   losartan  100 MG tablet Commonly known as: COZAAR TAKE 1 TABLET(100 MG) BY MOUTH DAILY   metFORMIN 500 MG 24 hr tablet Commonly known as: GLUCOPHAGE-XR TAKE 4 TABLETS(2000 MG) BY MOUTH DAILY WITH BREAKFAST   metoprolol tartrate 100 MG tablet Commonly known as: LOPRESSOR TAKE 1 TABLET(100 MG) BY MOUTH TWICE DAILY   mometasone 50 MCG/ACT nasal spray Commonly known as:  NASONEX SHAKE LIQUID AND USE 2 SPRAYS IN EACH NOSTRIL DAILY AS NEEDED   montelukast 10 MG tablet Commonly known as: SINGULAIR Take 1 tablet (10 mg total) by mouth at bedtime as needed.   triamcinolone ointment 0.5 % Commonly known as: KENALOG APPLY TOPICALLY TO THE AFFECTED AREA TWICE DAILY

## 2023-02-21 ENCOUNTER — Ambulatory Visit: Payer: Medicare Other | Admitting: Family Medicine

## 2023-02-21 ENCOUNTER — Encounter: Payer: Self-pay | Admitting: Family Medicine

## 2023-02-21 VITALS — BP 160/80 | HR 89 | Temp 98.4°F | Ht 61.5 in | Wt 189.4 lb

## 2023-02-21 DIAGNOSIS — E1159 Type 2 diabetes mellitus with other circulatory complications: Secondary | ICD-10-CM | POA: Diagnosis not present

## 2023-02-21 DIAGNOSIS — I10 Essential (primary) hypertension: Secondary | ICD-10-CM | POA: Diagnosis not present

## 2023-02-21 DIAGNOSIS — E559 Vitamin D deficiency, unspecified: Secondary | ICD-10-CM | POA: Diagnosis not present

## 2023-02-21 DIAGNOSIS — E785 Hyperlipidemia, unspecified: Secondary | ICD-10-CM | POA: Diagnosis not present

## 2023-02-21 DIAGNOSIS — Z0000 Encounter for general adult medical examination without abnormal findings: Secondary | ICD-10-CM

## 2023-02-21 DIAGNOSIS — Z8673 Personal history of transient ischemic attack (TIA), and cerebral infarction without residual deficits: Secondary | ICD-10-CM

## 2023-02-21 MED ORDER — HYDROCHLOROTHIAZIDE 25 MG PO TABS: 25.0000 mg | ORAL_TABLET | Freq: Every day | ORAL | 1 refills | Status: DC

## 2023-02-21 MED ORDER — ROSUVASTATIN CALCIUM 5 MG PO TABS: 5.0000 mg | ORAL_TABLET | Freq: Every day | ORAL | 3 refills | Status: DC

## 2023-02-21 NOTE — Patient Instructions (Addendum)
Start Vitamin D 2,000 units each day  Start Rosuvastatin (Crestor) 5 mg each day  Start Hydrochlorothiazide 25 mg each day

## 2023-03-08 ENCOUNTER — Other Ambulatory Visit: Payer: Self-pay | Admitting: Family Medicine

## 2023-03-08 DIAGNOSIS — E1159 Type 2 diabetes mellitus with other circulatory complications: Secondary | ICD-10-CM

## 2023-03-08 MED ORDER — LOSARTAN POTASSIUM 100 MG PO TABS: ORAL_TABLET | ORAL | 3 refills | Status: AC

## 2023-03-08 MED ORDER — GLIPIZIDE ER 10 MG PO TB24: 10.0000 mg | ORAL_TABLET | Freq: Every day | ORAL | 3 refills | Status: AC

## 2023-03-08 MED ORDER — METOPROLOL TARTRATE 100 MG PO TABS: 100.0000 mg | ORAL_TABLET | Freq: Two times a day (BID) | ORAL | 3 refills | Status: AC

## 2023-03-08 MED ORDER — METFORMIN HCL ER 500 MG PO TB24: 2000.0000 mg | ORAL_TABLET | Freq: Every day | ORAL | 3 refills | Status: AC

## 2023-03-08 MED ORDER — TRIAMCINOLONE ACETONIDE 0.5 % EX OINT: TOPICAL_OINTMENT | Freq: Two times a day (BID) | CUTANEOUS | 2 refills | Status: DC

## 2023-03-08 NOTE — Telephone Encounter (Signed)
Copied from CRM [id number]. Topic: Clinical - Medication Refill >> Mar 08, 2023 10:44 AM Corin V wrote: Most Recent Primary Care Visit:  Provider: [name]  Department: [facility]  Visit Type: ANNUAL WELL VISIT, SEQUENTIAL  Date: 02/21/2023  Medication: hydrochlorothiazide (HYDRODIURIL) 25 MG tablet rosuvastatin (CRESTOR) 5 MG tablet losartan (COZAAR) 100 MG tablet metFORMIN (GLUCOPHAGE-XR) 500 MG 24 hr tablet triamcinolone ointment (KENALOG) 0.5 % metoprolol tartrate (LOPRESSOR) 100 MG tablet glipiZIDE (GLIPIZIDE XL) 10 MG 24 hr tablet  Has the patient contacted their pharmacy? Yes (Agent: If no, request that the patient contact the pharmacy for the refill. If patient does not wish to contact the pharmacy document the reason why and proceed with request.) (Agent: If yes, when and what did the pharmacy advise?)  Is this the correct pharmacy for this prescription? Yes If no, delete pharmacy and type the correct one.  This is the patient's preferred pharmacy:  [id number] - [place], [place] - [address] AT [facility] OF [address] [place] [address] Phone: [phone] Fax: [phone]   Has the prescription been filled recently? No  Is the patient out of the medication? No  Has the patient been seen for an appointment in the last year OR does the patient have an upcoming appointment? Yes  Can we respond through MyChart? No  Agent: Please be advised that Rx refills may take up to 3 business days. We ask that you follow-up with your pharmacy.

## 2023-03-08 NOTE — Telephone Encounter (Signed)
Last office visit [facility] 02/21/2023.  Last refilled 10/23/2022 for 60 g with 2 refills.  Next Appt: 05/23/2023 for HTN/DM.

## 2023-03-13 ENCOUNTER — Other Ambulatory Visit: Payer: Self-pay | Admitting: Internal Medicine

## 2023-03-13 ENCOUNTER — Other Ambulatory Visit: Payer: Self-pay | Admitting: Family Medicine

## 2023-03-14 NOTE — Addendum Note (Signed)
 Addended by: Elease Etienne A on: 03/14/2023 07:54 AM   Modules accepted: Orders

## 2023-03-14 NOTE — Progress Notes (Signed)
 Remote pacemaker transmission.

## 2023-03-21 ENCOUNTER — Encounter: Payer: Self-pay | Admitting: Internal Medicine

## 2023-04-03 ENCOUNTER — Other Ambulatory Visit (HOSPITAL_COMMUNITY): Payer: Self-pay

## 2023-04-23 ENCOUNTER — Other Ambulatory Visit: Payer: Self-pay | Admitting: Family Medicine

## 2023-04-23 ENCOUNTER — Other Ambulatory Visit: Payer: Self-pay | Admitting: Internal Medicine

## 2023-04-23 DIAGNOSIS — I480 Paroxysmal atrial fibrillation: Secondary | ICD-10-CM

## 2023-04-24 NOTE — Telephone Encounter (Signed)
 Prescription refill request for Eliquis received. Indication:afib Last office visit:needs appt Scr:1.36  1/25 Age: 78 Weight:85.9  kg  Prescription refilled

## 2023-05-03 ENCOUNTER — Ambulatory Visit: Attending: Internal Medicine | Admitting: Internal Medicine

## 2023-05-03 ENCOUNTER — Encounter: Payer: Self-pay | Admitting: Internal Medicine

## 2023-05-03 VITALS — BP 178/99 | HR 71 | Ht 61.5 in | Wt 195.4 lb

## 2023-05-03 DIAGNOSIS — I495 Sick sinus syndrome: Secondary | ICD-10-CM | POA: Diagnosis not present

## 2023-05-03 DIAGNOSIS — Z950 Presence of cardiac pacemaker: Secondary | ICD-10-CM

## 2023-05-03 DIAGNOSIS — I480 Paroxysmal atrial fibrillation: Secondary | ICD-10-CM | POA: Diagnosis not present

## 2023-05-03 LAB — PACEMAKER DEVICE OBSERVATION

## 2023-05-03 MED ORDER — AMLODIPINE BESYLATE 2.5 MG PO TABS: 2.5000 mg | ORAL_TABLET | Freq: Every day | ORAL | 1 refills | Status: DC

## 2023-05-03 NOTE — Progress Notes (Signed)
 Patient Care Team: [name], MD as PCP - General Pernell Dupre, Isabella Bowens, [facility] (Inactive) as Pharmacist (Pharmacist)   HPI  Nina Carpenter is a 78 y.o. female Seen in follow-up for atrial fibrillation and tachybradysyndrome for which she underwent pacing 2014 Medtronic . PVI at [facility] 2015 There is been a marked diminution in atrial fibrillation symptoms and by device interrogation burden as well. She does have some nonsustained atrial tachycardia/slow flutter   On Apixoban  no bleeding   The patient denies chest pain, shortness of breath, nocturnal dyspnea, orthopnea or peripheral edema.  There have been no palpitations, lightheadedness or syncope.    Blood pressures at home typically in the 140s range or so.    DATE TEST EF   2/14 Echo   45-50 %   8/21 Echo  55-60%     Date Cr K Hgb A1c  3/19 0.83 4.6 14.6   7/20 0.99 4.7 14.3 8.0  7/21    6.4  12/21 1.32 4.0 12.4 6.0 (6/22)   12/23 1.19 4.8 12.7   1/25 1.36 4.9 14.2           Thromboembolic risk factors ( age -24, HTN-1, TIA/CVA-2, DM-1, Gender-1) for a CHADSVASc Score >=7   Past Medical History:  Diagnosis Date   Acid reflux disease    Chronic combined systolic and diastolic CHF (congestive heart failure) (HCC)    a. Felt related to AF. EF 45-50% by echo 12/2012.   CVA (cerebral vascular accident) (HCC)    a. Thromboemoblic L MCA CVA 12/2012 with no residual deficit except mild occasional word finding. b. 1-39% RICA/LICA by dopplers 12/29/12.   GERD (gastroesophageal reflux disease)    Hx of rheumatic fever    a. As a child.   Hyperlipidemia    Hypertension    Kidney stones    a. Remotely.   PAF (paroxysmal atrial fibrillation) (HCC)    a. Dx 09/2012, chronicity unclear at that time. Initially refused Xarelto due to risk of bleeding. b. Sustained CVA 12/2012 - agreeable to taking Eliquis. Tachybrady syndrome during that admission s/p Medtronic PPM implantation;  c. Failed flecainide, sotalol,  amiodarone (itching), and propafenone.   Tachy-brady syndrome (HCC)    a. AF RVR with pauses >5sec requiring Medtronic PPM 12/2012.   Tubular adenoma of colon 05/2008   Type 2 diabetes mellitus (HCC)     Past Surgical History:  Procedure Laterality Date   CHOLECYSTECTOMY     DESTRUCTION TRIGEMINAL NERVE VIA NEUROLYTIC AGENT  NOv @013    Gamma Knife    ENDOMETRIAL ABLATION     INSERT / REPLACE / REMOVE PACEMAKER     PACEMAKER INSERTION  12-30-12   MDT dual chamber pacemaker implanted by Dr [name] for tachy-brady syndrome   PERMANENT PACEMAKER INSERTION N/A 12/30/2012   Procedure: PERMANENT PACEMAKER INSERTION;  Surgeon: Duke Salvia, MD;  Location[facility] CATH LAB;  Service: Cardiovascular;  Laterality: N/A;   TUBAL LIGATION     ectopic preganacy     Current Outpatient Medications  Medication Sig Dispense Refill   Accu-Chek FastClix Lancets MISC USE TO CHECK BLOOD SUGAR ONCE DAILY 102 each 0   apixaban (ELIQUIS) 5 MG TABS tablet Take 1 tablet (5 mg total) by mouth 2 (two) times daily. NEEDS CARDIOLOGY APPT FOR ELIQUIS REFILLS, CALL OFFICE.  THANK YOU 60 tablet 0   augmented betamethasone dipropionate (DIPROLENE-AF) 0.05 % ointment Apply topically daily as needed.     Blood Glucose Calibration (  ACCU-CHEK GUIDE CONTROL) LIQD 1 each by In Vitro route as directed. 1 each 0   Blood Glucose Monitoring Suppl (ACCU-CHEK GUIDE ME) w/Device KIT 1 each by Does not apply route every 14 (fourteen) days. Use to check FBS and 2 HR PP once every 2 weeks 1 kit 0   EPINEPHrine 0.3 mg/0.3 mL IJ SOAJ injection Inject 0.3 mg into the muscle as needed.     fexofenadine (ALLEGRA) 180 MG tablet Take 180 mg by mouth daily.     furosemide (LASIX) 20 MG tablet TAKE 1 TABLET(20 MG) BY MOUTH 1 TIME EVERY OTHER DAY 45 tablet 3   glipiZIDE (GLIPIZIDE XL) 10 MG 24 hr tablet Take 1 tablet (10 mg total) by mouth daily with breakfast. 90 tablet 3   glucose blood (ACCU-CHEK GUIDE) test strip USE TO CHECK BLOOD SUGAR ONCE  DAILY 100 strip 3   hydrochlorothiazide (HYDRODIURIL) 25 MG tablet Take 1 tablet (25 mg total) by mouth daily. 90 tablet 1   Lancets Misc. (ACCU-CHEK FASTCLIX LANCET) KIT Use to check blood sugar once daily 1 kit 0   losartan (COZAAR) 100 MG tablet TAKE 1 TABLET(100 MG) BY MOUTH DAILY 90 tablet 3   metFORMIN (GLUCOPHAGE-XR) 500 MG 24 hr tablet Take 4 tablets (2,000 mg total) by mouth daily with breakfast. 360 tablet 3   metoprolol tartrate (LOPRESSOR) 100 MG tablet Take 1 tablet (100 mg total) by mouth 2 (two) times daily. 180 tablet 3   mometasone (NASONEX) 50 MCG/ACT nasal spray SHAKE LIQUID AND USE 2 SPRAYS IN EACH NOSTRIL DAILY AS NEEDED 51 g 0   montelukast (SINGULAIR) 10 MG tablet Take 1 tablet (10 mg total) by mouth at bedtime as needed. 90 tablet 3   rosuvastatin (CRESTOR) 5 MG tablet Take 1 tablet (5 mg total) by mouth daily. 90 tablet 3   triamcinolone ointment (KENALOG) 0.5 % Apply topically 2 (two) times daily. 60 g 2   No current facility-administered medications for this visit.    Allergies  Allergen Reactions   Bee Venom Anaphylaxis   Fish Allergy Swelling    Shell fish   Penicillins Hives and Itching   Amiodarone Itching   Iodine Itching    itching   Ace Inhibitors Cough   Januvia [Sitagliptin] Rash   Lidocaine Hcl Palpitations    ALL CAINES given during dental procedures   Lipitor [Atorvastatin] Other (See Comments)    myalgias    Review of Systems negative except from HPI and PMH  Physical Exam  BP (!) 178/99   Pulse 71   Ht 5' 1.5" (1.562 m)   Wt 195 lb 6.4 oz (88.6 kg)   SpO2 96%   BMI 36.32 kg/m  Well developed and well nourished in no acute distress HENT normal Neck supple with JVP-flat Clear Device pocket well healed; without hematoma or erythema.  There is no tethering  Regular rate and rhythm, no  gallop No  murmur Abd-soft with active BS No Clubbing cyanosis  edema Skin-warm and dry A & Oriented  Grossly normal sensory and motor  function  ECG A pacing @ 71 18/07/41  Device function is normal. Battery approaching Elective Replacement Indicator    Programming changes    See Paceart for details    Assessment and  Plan  Atrial fibrillation-paroxysmal status post PVI [facility] 4/15  Hypertension  Hyperlipidemia  Sinus node dysfunction  LV dysfunction-recovered  Heart failure- diastolic chronic  Pacemaker Medtronic    No interval atrial fibrillation.  Of any note.  Blood pressure remains quite elevated.  Even at Home.  This is metoprolol and losartan and HydroDIURIL, we will add low-dose amlodipine  Euvolemic.  Continue Lasix and HydroDIURIL  Pacemaker approaching ERI.  Told her that we will probably need to change out the next 4 or 5 months or so

## 2023-05-03 NOTE — Patient Instructions (Signed)
 Medication Instructions:  Your physician has recommended you make the following change in your medication:   ** Begin Amlodipine 2.5mg  - 1 tablet by mouth daily  *If you need a refill on your cardiac medications before your next appointment, please call your pharmacy*  Lab Work: None ordered.  If you have labs (blood work) drawn today and your tests are completely normal, you will receive your results only by: MyChart Message (if you have MyChart) OR A paper copy in the mail If you have any lab test that is abnormal or we need to change your treatment, we will call you to review the results.  Testing/Procedures: None ordered.   Follow-Up: At [facility], you and your health needs are our priority.  As part of our continuing mission to provide you with exceptional heart care, our providers are all part of one team.  This team includes your primary Cardiologist (physician) and Advanced Practice Providers or APPs (Physician Assistants and Nurse Practitioners) who all work together to provide you with the care you need, when you need it.  Your next appointment:   4 month follow up with Sherie Don, NP

## 2023-05-08 ENCOUNTER — Telehealth: Payer: Self-pay | Admitting: Internal Medicine

## 2023-05-08 NOTE — Telephone Encounter (Signed)
 LM: requested call back :  Appears you received a call because you missed a device transmission today. Request to call back to discuss.   Thanks.

## 2023-05-08 NOTE — Telephone Encounter (Signed)
 Pt states since she was in the office and got her device checked she did not see the need to do a remote check at home. I canceled the April remote check. I let her know her next check is 08/02/2023.

## 2023-05-08 NOTE — Telephone Encounter (Signed)
 Follow Up:     Patient said she was returning a call from [facility].

## 2023-05-14 ENCOUNTER — Other Ambulatory Visit: Payer: Self-pay | Admitting: Family Medicine

## 2023-05-14 NOTE — Telephone Encounter (Signed)
 Last office visit 07/20/2022 for DM. Refill is for Triamcinolone  1% Cream 454 g Triamcinolone  ointment is on current medication list..  Next Appt: 05/23/2023 for HTN/DM.

## 2023-05-22 NOTE — Progress Notes (Unsigned)
     Nina Tamez T. Sheria Rosello, MD, CAQ Sports Medicine [facility] [address] [place], [address]  Phone: [phone]  FAX: [phone]  Nina Carpenter - 78 y.o. female  MRN [id number]  Date of Birth: 10/13/1945  Date: 05/23/2023  PCP: Scherrie Curt, MD  Referral: Scherrie Curt, MD  No chief complaint on file.  Subjective:   Nina Carpenter is a 78 y.o. very pleasant female patient with There is no height or weight on file to calculate BMI. who presents with the following:  She does have a history of high blood pressure, which was poorly controlled the last time she was here in the office.  She is currently on amlodipine  2.5 mg, hydrochlorothiazide  25 mg, losartan  100 mg, and metoprolol  100 mg p.o. twice daily.  At her last office visit, did not restart her hydrochlorothiazide .  She also has high cholesterol, and we restarted her on Crestor  5 mg, after she stopped this on her own.  Review of Systems is noted in the HPI, as appropriate  Objective:   There were no vitals taken for this visit.  GEN: No acute distress; alert,appropriate. PULM: Breathing comfortably in no respiratory distress PSYCH: Normally interactive.   Laboratory and Imaging Data:  Assessment and Plan:   ***

## 2023-05-23 ENCOUNTER — Encounter: Payer: Self-pay | Admitting: Family Medicine

## 2023-05-23 ENCOUNTER — Ambulatory Visit (INDEPENDENT_AMBULATORY_CARE_PROVIDER_SITE_OTHER): Payer: Medicare Other | Admitting: Family Medicine

## 2023-05-23 ENCOUNTER — Encounter: Payer: Self-pay | Admitting: Internal Medicine

## 2023-05-23 VITALS — BP 165/80 | HR 84 | Temp 97.7°F | Ht 61.5 in | Wt 193.1 lb

## 2023-05-23 DIAGNOSIS — I10 Essential (primary) hypertension: Secondary | ICD-10-CM | POA: Diagnosis not present

## 2023-05-23 DIAGNOSIS — E1159 Type 2 diabetes mellitus with other circulatory complications: Secondary | ICD-10-CM | POA: Diagnosis not present

## 2023-05-23 DIAGNOSIS — Z7984 Long term (current) use of oral hypoglycemic drugs: Secondary | ICD-10-CM | POA: Diagnosis not present

## 2023-05-23 DIAGNOSIS — I480 Paroxysmal atrial fibrillation: Secondary | ICD-10-CM

## 2023-05-23 DIAGNOSIS — E785 Hyperlipidemia, unspecified: Secondary | ICD-10-CM | POA: Diagnosis not present

## 2023-05-23 LAB — POCT GLYCOSYLATED HEMOGLOBIN (HGB A1C): Hemoglobin A1C: 6.7 % — AB (ref 4.0–5.6)

## 2023-05-28 ENCOUNTER — Other Ambulatory Visit: Payer: Self-pay | Admitting: Internal Medicine

## 2023-05-28 DIAGNOSIS — I480 Paroxysmal atrial fibrillation: Secondary | ICD-10-CM

## 2023-05-28 NOTE — Telephone Encounter (Signed)
 Prescription refill request for Eliquis  received. Indication: PAF Last office visit: 05/03/23  Doyle Generous MD Scr: 1.36 on 02/19/23 Age: 78 Weight: 88.6kg  Based on above findings Eliquis  5mg  twice daily is the appropriate dose.  Refill approved.

## 2023-06-07 ENCOUNTER — Other Ambulatory Visit: Payer: Self-pay | Admitting: Family Medicine

## 2023-06-07 NOTE — Telephone Encounter (Signed)
 Last office visit 05/23/2023 for DM/HTN.  Last refilled:  We have ointment on medication list which was refilled 03/08/2023 for 60 g with 2 refills.  Refill request is for cream.  Okay to refill?

## 2023-06-08 NOTE — Addendum Note (Signed)
 Addended by: Wyn Heater on: 06/08/2023 09:19 AM   Modules accepted: Orders

## 2023-06-08 NOTE — Telephone Encounter (Signed)
 Received fax from [facility] stating largest package is 15 gm for this high strength.

## 2023-06-09 MED ORDER — TRIAMCINOLONE ACETONIDE 0.5 % EX CREA: TOPICAL_CREAM | Freq: Two times a day (BID) | CUTANEOUS | 1 refills | Status: AC

## 2023-08-02 ENCOUNTER — Ambulatory Visit: Payer: Medicare Other

## 2023-08-02 DIAGNOSIS — I495 Sick sinus syndrome: Secondary | ICD-10-CM

## 2023-08-03 ENCOUNTER — Telehealth: Payer: Self-pay | Admitting: Internal Medicine

## 2023-08-03 LAB — CUP PACEART REMOTE DEVICE CHECK
Battery Remaining Longevity: 1 mo
Battery Voltage: 2.82 V
Brady Statistic AP VP Percent: 0.1 %
Brady Statistic AP VS Percent: 61.57 %
Brady Statistic AS VP Percent: 0.02 %
Brady Statistic AS VS Percent: 38.31 %
Brady Statistic RA Percent Paced: 61.6 %
Brady Statistic RV Percent Paced: 0.12 %
Date Time Interrogation Session: 20250710150137
Implantable Lead Connection Status: 753985
Implantable Lead Connection Status: 753985
Implantable Lead Implant Date: 20141208
Implantable Lead Implant Date: 20141208
Implantable Lead Location: 753859
Implantable Lead Location: 753860
Implantable Lead Model: 5076
Implantable Lead Model: 5076
Implantable Pulse Generator Implant Date: 20141208
Lead Channel Impedance Value: 361 Ohm
Lead Channel Impedance Value: 361 Ohm
Lead Channel Impedance Value: 380 Ohm
Lead Channel Impedance Value: 380 Ohm
Lead Channel Pacing Threshold Amplitude: 0.5 V
Lead Channel Pacing Threshold Amplitude: 0.5 V
Lead Channel Pacing Threshold Pulse Width: 0.4 ms
Lead Channel Pacing Threshold Pulse Width: 0.4 ms
Lead Channel Sensing Intrinsic Amplitude: 2.875 mV
Lead Channel Sensing Intrinsic Amplitude: 2.875 mV
Lead Channel Sensing Intrinsic Amplitude: 5.375 mV
Lead Channel Sensing Intrinsic Amplitude: 5.375 mV
Lead Channel Setting Pacing Amplitude: 2 V
Lead Channel Setting Pacing Amplitude: 2.5 V
Lead Channel Setting Pacing Pulse Width: 0.4 ms
Lead Channel Setting Sensing Sensitivity: 2 mV
Zone Setting Status: 755011
Zone Setting Status: 755011

## 2023-08-03 NOTE — Telephone Encounter (Signed)
 Scheduled transmission received and forwarded to Device Triage by [employer]: PPM scheduled remote reviewed. Normal device function.  Presenting rhythm: AP-VS 70s RRT since 06/28/23.  Routing to clinic per protocol. 8 NSVT events. V- rates 154-167bpm. All appear 1:1 AV conduction.   Outreach made to patient: Nina Carpenter has been notified that her PPM reached [facility] on 06/28/23.  She was last seen by Dr. Fernande 04/2023- generator change not explained in detail at that visit.  The patient was scheduled to see Suzann Riddle, NP on 09/17/23 in the [place] office.  Advised the patient we would like to see her sooner to discuss generator changeout. The patient voices understanding and is agreeable.  She has been scheduled to see Suzann, NP on 08/13/23 at 9:30 am in [place]. The patient has confirmed this appointment and was appreciative of the call back.

## 2023-08-04 ENCOUNTER — Ambulatory Visit: Payer: Self-pay | Admitting: Cardiology

## 2023-08-10 NOTE — Progress Notes (Signed)
 Electrophysiology Clinic Note    Date:  08/13/2023  Patient ID:  Nina Carpenter, Nina Carpenter Jun 23, 1945, MRN [id number] PCP:  Watt Mirza, MD  Cardiologist:  None  Electrophysiologist:  Dr. Fernande > [name], MD (has not met) Electrophysiology APP:  Toshua Honsinger, NP     Discussed the use of AI scribe software for clinical note transcription with the patient, who gave verbal consent to proceed.   Patient Profile    Chief Complaint: PPM at [facility]  History of Present Illness: Nina Carpenter is a 78 y.o. female with PMH notable for parox Afib, Atach, aflutter, tachy-brady s/p PPM, CVA, T2DM, HTN, HLD; seen today for [name], MD for acute visit due to PPM at [facility].    She is s/p AF ablation in 2015 at [facility].   She last saw Dr. Fernande 04/2023 where her BP was quite elevated. Amlodipine  added to her metop and losartan .  Device clinic alerted to PPM at [facility] on 6/5  On follow-up today, she is overall doing very well. She has not had any AF episodes since her ablation. She denies palpitations, chest pain, chest pressure, SOB or DOE. She has chronic lower extremity swelling that skin irritation that started after the shingles shot about a year ago. She has worked with dermatology and PCP for this.  She continues to take eliquis  BID, no bleeding concerns.     Arrhythmia/Device History MDT dual chamber PPM, imp 12/2012; dx SND    ROS:  Please see the history of present illness. All other systems are reviewed and otherwise negative.    Physical Exam    VS:  BP (!) 142/84 (BP Location: Left Arm, Patient Position: Sitting, Cuff Size: Normal)   Pulse 65   Ht 5' 2.5 (1.588 m)   Wt 201 lb 9.6 oz (91.4 kg)   SpO2 98%   BMI 36.29 kg/m  BMI: Body mass index is 36.29 kg/m.      Wt Readings from Last 3 Encounters:  08/13/23 201 lb 9.6 oz (91.4 kg)  05/23/23 193 lb 2 oz (87.6 kg)  05/03/23 195 lb 6.4 oz (88.6 kg)     GEN- The patient is well appearing, alert and  oriented x 3 today.   Lungs- Clear to ausculation bilaterally, normal work of breathing.  Heart- Regular rate and rhythm, no murmurs, rubs or gallops Extremities- 1-2+ L>R peripheral edema, warm, dry Skin-  device pocket well-healed, no tethering   Device interrogation done today and reviewed by myself:  Battery RRT on 6/5 Lead thresholds, impedence, sensing stable  Intrinsic rhythm ~upper 50s Low VP Very brief SVT episodes, less than 3 seconds in duration No changes made today   Studies Reviewed   Previous EP, cardiology notes.    EKG is ordered. Personal review of EKG from today shows:    EKG Interpretation Date/Time:  Monday August 13 2023 09:39:19 EDT Ventricular Rate:  65 PR Interval:    QRS Duration:  80 QT Interval:  432 QTC Calculation: 449 R Axis:   18  Text Interpretation: Atrial-paced rhythm Confirmed by Arohi Salvatierra [phone]) on 08/13/2023 9:42:04 AM    TTE, 09/15/2019 1. Left ventricular ejection fraction, by estimation, is 55 to 60%. The left ventricle has normal function. The left ventricle has no regional wall motion abnormalities. Left ventricular diastolic parameters are indeterminate.   2. Right ventricular systolic function is normal. The right ventricular size is normal. There is normal pulmonary artery systolic pressure. The estimated  right ventricular systolic pressure is 32.7 mmHg.   3. The mitral valve is normal in structure. Trivial mitral valve regurgitation. No evidence of mitral stenosis.   4. The aortic valve is normal in structure. Aortic valve regurgitation is not visualized. No aortic stenosis is present.   5. The inferior vena cava is normal in size with greater than 50% respiratory variability, suggesting right atrial pressure of 3 mmHg.   Comparison(s): Previous Echo showed LV EF 45-50%, no RWMA.    Assessment and Plan     #) SND s/p PPM Device at [facility]. We discussed generator change procedure including risks and benefits. Patient verbalized  understanding and wished to proceed.  She requested to have procedure performed by Dr. Cindie since he is following her device, is aware this will be done in [place] d/t schedule limitations Pre-procedure labs today  #) parox AFib #) SVT S/p AF ablation 2015 No AFib episodes on device, very low SVT burden with very brief, asymptomatic episodes  #) Hypercoag d/t  afib #) H/o CVA CHA2DS2-VASc Score = at least 7 [CHF History: 0, HTN History: 1, Diabetes History: 1, Stroke History: 2, Vascular Disease History: 0, Age Score: 2, Gender Score: 1].  Therefore, the patient's annual risk of stroke is 11.2 %.    Stroke ppx - 5mg  eliquis  BID, appropriately dosed No bleeding concerns She will hold eliquis  3 days prior to PPM gen change procedure        Current medicines are reviewed at length with the patient today.   The patient does not have concerns regarding her medicines.  The following changes were made today:  none  Labs/ tests ordered today include:  Orders Placed This Encounter  Procedures   EKG 12-Lead     Disposition: Follow up with EP Team or EP APP as usual post procedure   Signed, Chantal Needle, NP  08/13/23  10:28 AM  Electrophysiology [facility]

## 2023-08-13 ENCOUNTER — Ambulatory Visit: Attending: Cardiology | Admitting: Cardiology

## 2023-08-13 VITALS — BP 142/84 | HR 65 | Ht 62.5 in | Wt 201.6 lb

## 2023-08-13 DIAGNOSIS — Z950 Presence of cardiac pacemaker: Secondary | ICD-10-CM | POA: Diagnosis not present

## 2023-08-13 DIAGNOSIS — I495 Sick sinus syndrome: Secondary | ICD-10-CM | POA: Insufficient documentation

## 2023-08-13 DIAGNOSIS — D6869 Other thrombophilia: Secondary | ICD-10-CM | POA: Insufficient documentation

## 2023-08-13 DIAGNOSIS — I480 Paroxysmal atrial fibrillation: Secondary | ICD-10-CM | POA: Insufficient documentation

## 2023-08-13 LAB — CUP PACEART INCLINIC DEVICE CHECK
Date Time Interrogation Session: 20250721115449
Implantable Lead Connection Status: 753985
Implantable Lead Connection Status: 753985
Implantable Lead Implant Date: 20141208
Implantable Lead Implant Date: 20141208
Implantable Lead Location: 753859
Implantable Lead Location: 753860
Implantable Lead Model: 5076
Implantable Lead Model: 5076
Implantable Pulse Generator Implant Date: 20141208

## 2023-08-13 NOTE — Patient Instructions (Signed)
 Medication Instructions:  The current medical regimen is effective;  continue present plan and medications as directed. Please refer to the Current Medication list given to you today.   *If you need a refill on your cardiac medications before your next appointment, please call your pharmacy*  Lab Work: Your provider would like for you to have following labs drawn today CBC, BMET.   If you have labs (blood work) drawn today and your tests are completely normal, you will receive your results only by: MyChart Message (if you have MyChart) OR A paper copy in the mail If you have any lab test that is abnormal or we need to change your treatment, we will call you to review the results.  Testing/Procedures: PPM GEN CHANGE - follow letter given to you today.  Follow-Up: At [facility], you and your health needs are our priority.  As part of our continuing mission to provide you with exceptional heart care, our providers are all part of one team.  This team includes your primary Cardiologist (physician) and Advanced Practice Providers or APPs (Physician Assistants and Nurse Practitioners) who all work together to provide you with the care you need, when you need it.  Your next appointment:   They will call you for follow up appointments  We recommend signing up for the patient portal called MyChart.  Sign up information is provided on this After Visit Summary.  MyChart is used to connect with patients for Virtual Visits (Telemedicine).  Patients are able to view lab/test results, encounter notes, upcoming appointments, etc.  Non-urgent messages can be sent to your provider as well.   To learn more about what you can do with MyChart, go to [email or web address].   Other Instructions Follow instruction letter given to you today.

## 2023-08-14 ENCOUNTER — Ambulatory Visit: Payer: Self-pay | Admitting: Cardiology

## 2023-08-14 LAB — CBC
Hematocrit: 44 % (ref 34.0–46.6)
Hemoglobin: 14.7 g/dL (ref 11.1–15.9)
MCH: 30.6 pg (ref 26.6–33.0)
MCHC: 33.4 g/dL (ref 31.5–35.7)
MCV: 92 fL (ref 79–97)
Platelets: 256 x10E3/uL (ref 150–450)
RBC: 4.8 x10E6/uL (ref 3.77–5.28)
RDW: 12.8 % (ref 11.7–15.4)
WBC: 7.1 x10E3/uL (ref 3.4–10.8)

## 2023-08-14 LAB — BASIC METABOLIC PANEL WITH GFR
BUN/Creatinine Ratio: 16 (ref 12–28)
BUN: 20 mg/dL (ref 8–27)
CO2: 20 mmol/L (ref 20–29)
Calcium: 9.5 mg/dL (ref 8.7–10.3)
Chloride: 100 mmol/L (ref 96–106)
Creatinine, Ser: 1.24 mg/dL — ABNORMAL HIGH (ref 0.57–1.00)
Glucose: 144 mg/dL — ABNORMAL HIGH (ref 70–99)
Potassium: 4.9 mmol/L (ref 3.5–5.2)
Sodium: 139 mmol/L (ref 134–144)
eGFR: 45 mL/min/1.73 — ABNORMAL LOW (ref >59)

## 2023-09-03 ENCOUNTER — Telehealth (HOSPITAL_COMMUNITY): Payer: Self-pay

## 2023-09-03 NOTE — Telephone Encounter (Signed)
 Spoke with patient to discuss upcoming procedure.   Confirmed patient is scheduled for a PPM generator change on Friday, August 15 with Dr. Ole Holts. Instructed patient to arrive at the Main Entrance A at [facility]: [address] [place], [place] [address] and check in at Admitting at 12:30 PM.   Labs completed  Any recent signs of acute illness or been started on antibiotics? No Any new medications started? No Any medications to hold? Hold Eliquis for 3 days prior to your procedure. Your last dose will be today, August 11. Hold Lasix, hydrochlorothiazide, Glipizide, and Metformin the day of your procedure. Medication instructions:  On the morning of your procedure you may take all other morning medications not discussed with a sip of water  No eating or drinking after midnight prior to procedure.   The night before your procedure and the morning of your procedure, wash thoroughly with the CHG surgical soap from the neck down, paying special attention to the area where your procedure will be performed.  Advised of plan to go home the same day and will only stay overnight if medically necessary. You MUST have a responsible adult to drive you home and MUST be with you the first 24 hours after you arrive home.  Patient verbalized understanding to all instructions provided and agreed to proceed with procedure.

## 2023-09-07 ENCOUNTER — Other Ambulatory Visit: Payer: Self-pay

## 2023-09-07 ENCOUNTER — Ambulatory Visit (HOSPITAL_COMMUNITY)
Admission: RE | Admit: 2023-09-07 | Discharge: 2023-09-07 | Disposition: A | Discharge status: Home / Self Care | Attending: Cardiology | Admitting: Cardiology

## 2023-09-07 ENCOUNTER — Encounter (HOSPITAL_COMMUNITY)
Admission: RE | Disposition: A | Discharge status: Home / Self Care | Payer: Self-pay | Source: Home / Self Care | Attending: Cardiology

## 2023-09-07 DIAGNOSIS — I495 Sick sinus syndrome: Secondary | ICD-10-CM

## 2023-09-07 DIAGNOSIS — Z45010 Encounter for checking and testing of cardiac pacemaker pulse generator [battery]: Secondary | ICD-10-CM | POA: Diagnosis not present

## 2023-09-07 HISTORY — PX: PPM GENERATOR CHANGEOUT: EP1233

## 2023-09-07 LAB — GLUCOSE, CAPILLARY
Glucose-Capillary: 173 mg/dL — ABNORMAL HIGH (ref 70–99)
Glucose-Capillary: 171 mg/dL — ABNORMAL HIGH (ref 70–99)

## 2023-09-07 SURGERY — PPM GENERATOR CHANGEOUT

## 2023-09-07 MED ORDER — VANCOMYCIN HCL 1000 MG IV SOLR: INTRAVENOUS | Status: DC | PRN

## 2023-09-07 MED ORDER — SODIUM CHLORIDE 0.9 % IV SOLN
INTRAVENOUS | Status: DC | PRN
  Administered 2023-09-07: 80 mg

## 2023-09-07 MED ORDER — SODIUM CHLORIDE 0.9 % IV SOLN: 80.0000 mg | INTRAVENOUS | Status: DC

## 2023-09-07 MED ORDER — ONDANSETRON HCL 4 MG/2ML IJ SOLN: 4.0000 mg | Freq: Four times a day (QID) | INTRAMUSCULAR | Status: DC | PRN

## 2023-09-07 MED ORDER — SODIUM CHLORIDE 0.9 % IV SOLN
INTRAVENOUS | Status: AC
  Filled 2023-09-07: qty 2

## 2023-09-07 MED ORDER — SODIUM CHLORIDE 0.9% FLUSH: 3.0000 mL | INTRAVENOUS | Status: DC | PRN

## 2023-09-07 MED ORDER — MIDAZOLAM HCL 5 MG/5ML IJ SOLN
INTRAMUSCULAR | Status: AC
  Filled 2023-09-07: qty 5

## 2023-09-07 MED ORDER — MIDAZOLAM HCL 5 MG/5ML IJ SOLN
INTRAMUSCULAR | Status: DC | PRN
Start: 2023-09-07 — End: 2023-09-07
  Administered 2023-09-07: 1 mg via INTRAVENOUS

## 2023-09-07 MED ORDER — SODIUM CHLORIDE 0.9% FLUSH: 3.0000 mL | Freq: Two times a day (BID) | INTRAVENOUS | Status: DC

## 2023-09-07 MED ORDER — SODIUM CHLORIDE 0.9 % IV SOLN: INTRAVENOUS | Status: DC

## 2023-09-07 MED ORDER — VANCOMYCIN HCL IN DEXTROSE 1-5 GM/200ML-% IV SOLN
INTRAVENOUS | Status: AC
  Administered 2023-09-07: 1000 mg
  Filled 2023-09-07: qty 200

## 2023-09-07 MED ORDER — FENTANYL CITRATE (PF) 100 MCG/2ML IJ SOLN
INTRAMUSCULAR | Status: AC
  Filled 2023-09-07: qty 2

## 2023-09-07 MED ORDER — FENTANYL CITRATE (PF) 100 MCG/2ML IJ SOLN
INTRAMUSCULAR | Status: DC | PRN
  Administered 2023-09-07: 25 ug via INTRAVENOUS

## 2023-09-07 MED ORDER — SODIUM CHLORIDE 0.9 % IV SOLN: 250.0000 mL | INTRAVENOUS | Status: DC

## 2023-09-07 MED ORDER — VANCOMYCIN HCL IN DEXTROSE 1-5 GM/200ML-% IV SOLN: 1000.0000 mg | Freq: Once | INTRAVENOUS | Status: DC

## 2023-09-07 MED ORDER — LIDOCAINE HCL (PF) 1 % IJ SOLN
INTRAMUSCULAR | Status: DC | PRN
  Administered 2023-09-07: 50 mL

## 2023-09-07 MED ORDER — LIDOCAINE HCL 1 % IJ SOLN
INTRAMUSCULAR | Status: AC
  Filled 2023-09-07: qty 60

## 2023-09-07 MED ORDER — ACETAMINOPHEN 325 MG PO TABS: 325.0000 mg | ORAL_TABLET | ORAL | Status: DC | PRN

## 2023-09-07 MED ORDER — CHLORHEXIDINE GLUCONATE 4 % EX SOLN: 4.0000 | Freq: Once | CUTANEOUS | Status: DC

## 2023-09-07 SURGICAL SUPPLY — 5 items
CABLE SURGICAL S-101-97-12 (CABLE) ×2 IMPLANT
IPG PACE AZUR XT DR MRI W1DR01 (Pacemaker) IMPLANT
PAD DEFIB RADIO PHYSIO CONN (PAD) ×2 IMPLANT
POUCH AIGIS-R ANTIBACT PPM MED (Mesh General) IMPLANT
TRAY PACEMAKER INSERTION (PACKS) ×2 IMPLANT

## 2023-09-07 NOTE — Discharge Instructions (Signed)

## 2023-09-07 NOTE — H&P (Signed)
 Cardiology Admission History and Physical   Patient ID: Nina Carpenter MRN: [id number]; DOB: 11-23-1945   Admission date: 09/07/2023     History of Present Illness: Nina Carpenter is a 78 y.o. female with SND and a PPM who presents for generator replacement due to her device being at [facility]. No significant changes since her appointment with Suzann.   Past Medical History:  Diagnosis Date   Acid reflux disease    Chronic combined systolic and diastolic CHF (congestive heart failure) (HCC)    a. Felt related to AF. EF 45-50% by echo 12/2012.   CVA (cerebral vascular accident) (HCC)    a. Thromboemoblic L MCA CVA 12/2012 with no residual deficit except mild occasional word finding. b. 1-39% RICA/LICA by dopplers 12/29/12.   GERD (gastroesophageal reflux disease)    Hx of rheumatic fever    a. As a child.   Hyperlipidemia    Hypertension    Kidney stones    a. Remotely.   PAF (paroxysmal atrial fibrillation) (HCC)    a. Dx 09/2012, chronicity unclear at that time. Initially refused Xarelto due to risk of bleeding. b. Sustained CVA 12/2012 - agreeable to taking Eliquis. Tachybrady syndrome during that admission s/p Medtronic PPM implantation;  c. Failed flecainide, sotalol, amiodarone (itching), and propafenone.   Tachy-brady syndrome (HCC)    a. AF RVR with pauses >5sec requiring Medtronic PPM 12/2012.   Tubular adenoma of colon 05/2008   Type 2 diabetes mellitus (HCC)    Past Surgical History:  Procedure Laterality Date   CHOLECYSTECTOMY     DESTRUCTION TRIGEMINAL NERVE VIA NEUROLYTIC AGENT  NOv @013    Gamma Knife    ENDOMETRIAL ABLATION     INSERT / REPLACE / REMOVE PACEMAKER     PACEMAKER INSERTION  12-30-12   MDT dual chamber pacemaker implanted by Dr Fernande for tachy-brady syndrome   PERMANENT PACEMAKER INSERTION N/A 12/30/2012   Procedure: PERMANENT PACEMAKER INSERTION;  Surgeon: Elspeth JAYSON Fernande, MD;  Location: [facility] [place] CATH LAB;  Service: Cardiovascular;  Laterality: N/A;    TUBAL LIGATION     ectopic preganacy      Medications Prior to Admission: Prior to Admission medications   Medication Sig Start Date End Date Taking? Authorizing Provider  amLODipine (NORVASC) 2.5 MG tablet Take 1 tablet (2.5 mg total) by mouth daily. 05/03/23  Yes Fernande Elspeth JAYSON, MD  apixaban (ELIQUIS) 5 MG TABS tablet Take 1 tablet (5 mg total) by mouth 2 (two) times daily. 05/28/23  Yes Fernande Elspeth JAYSON, MD  fexofenadine (ALLEGRA) 180 MG tablet Take 180 mg by mouth daily as needed for allergies.   Yes [provider]  furosemide (LASIX) 20 MG tablet TAKE 1 TABLET(20 MG) BY MOUTH 1 TIME EVERY OTHER DAY Patient taking differently: Take 20 mg by mouth daily as needed for fluid. 03/13/23  Yes [name], Jacques, MD  glipiZIDE (GLIPIZIDE XL) 10 MG 24 hr tablet Take 1 tablet (10 mg total) by mouth daily with breakfast. 03/08/23  Yes [name], Jacques, MD  losartan (COZAAR) 100 MG tablet TAKE 1 TABLET(100 MG) BY MOUTH DAILY 03/08/23  Yes [name], Jacques, MD  metFORMIN (GLUCOPHAGE-XR) 500 MG 24 hr tablet Take 4 tablets (2,000 mg total) by mouth daily with breakfast. 03/08/23  Yes [name], Jacques, MD  metoprolol tartrate (LOPRESSOR) 100 MG tablet Take 1 tablet (100 mg total) by mouth 2 (two) times daily. 03/08/23  Yes [name], Jacques, MD  triamcinolone cream (KENALOG) 0.5 % Apply topically 2 (two) times daily. Patient taking  differently: Apply 1 Application topically 2 (two) times daily as needed (irritation). 06/09/23  Yes [name], Jacques, MD  Accu-Chek FastClix Lancets MISC USE TO CHECK BLOOD SUGAR ONCE DAILY 05/15/22   [name], Jacques, MD  Blood Glucose Calibration (ACCU-CHEK GUIDE CONTROL) LIQD 1 each by In Vitro route as directed. 04/21/19   [name], Jacques, MD  Blood Glucose Monitoring Suppl (ACCU-CHEK GUIDE ME) w/Device KIT 1 each by Does not apply route every 14 (fourteen) days. Use to check FBS and 2 HR PP once every 2 weeks 04/21/19   [name], Jacques, MD  glucose blood (ACCU-CHEK GUIDE)  test strip USE TO CHECK BLOOD SUGAR ONCE DAILY 05/15/22   [name], Jacques, MD  hydrochlorothiazide (HYDRODIURIL) 25 MG tablet Take 1 tablet (25 mg total) by mouth daily. Patient taking differently: Take 25 mg by mouth daily as needed (Fluid). 02/21/23   [name], Jacques, MD  Lancets Misc. (ACCU-CHEK FASTCLIX LANCET) KIT Use to check blood sugar once daily 05/15/22   [name], Jacques, MD  mometasone (NASONEX) 50 MCG/ACT nasal spray SHAKE LIQUID AND USE 2 SPRAYS IN EACH NOSTRIL DAILY AS NEEDED 02/20/21   Bedsole, Amy E, MD  montelukast (SINGULAIR) 10 MG tablet Take 1 tablet (10 mg total) by mouth at bedtime as needed. 02/18/21   Bedsole, Amy E, MD  rosuvastatin (CRESTOR) 5 MG tablet Take 1 tablet (5 mg total) by mouth daily. 02/21/23   Watt Jacques, MD     Allergies:    Allergies  Allergen Reactions   Bee Venom Anaphylaxis   Fish Allergy Swelling    Shell fish   Penicillins Hives and Itching   Amiodarone Itching   Iodine Itching    itching   Ace Inhibitors Cough   Januvia [Sitagliptin] Rash   Lidocaine Hcl Palpitations    ALL CAINES given during dental procedures   Lipitor [Atorvastatin] Other (See Comments)    myalgias    Social History:   Social History   Socioeconomic History   Marital status: Married    Spouse name: Not on file   Number of children: 2   Years of education: Not on file   Highest education level: Not on file  Occupational History   Occupation: Designer, fashion/clothing: SELF EMPLOYED  Tobacco Use   Smoking status: Former    Current packs/day: 0.00    Types: Cigarettes    Quit date: 01/24/1968    Years since quitting: 55.6   Smokeless tobacco: Never  Vaping Use   Vaping status: Never Used  Substance and Sexual Activity   Alcohol use: Yes    Alcohol/week: 1.0 standard drink of alcohol    Types: 1 Glasses of wine per week    Comment: rare use   Drug use: No   Sexual activity: Yes    Birth control/protection: Post-menopausal  Other Topics Concern    Not on file  Social History Narrative   Lives in [place] with her husband.  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   Social Drivers of Corporate investment banker Strain: Low Risk  (02/09/2022)   Overall Financial Resource Strain (CARDIA)    Difficulty of Paying Living Expenses: Not hard at all  Food Insecurity: No Food Insecurity (02/09/2022)   Hunger Vital Sign    Worried About Running Out of Food in the Last Year: Never true    Ran Out of Food in the Last Year: Never true  Transportation Needs: No Transportation Needs (02/09/2022)   PRAPARE - Transportation  Lack of Transportation (Medical): No    Lack of Transportation (Non-Medical): No  Physical Activity: Sufficiently Active (02/09/2022)   Exercise Vital Sign    Days of Exercise per Week: 5 days    Minutes of Exercise per Session: 30 min  Stress: No Stress Concern Present (02/09/2022)   [employer] of Occupational Health - Occupational Stress Questionnaire    Feeling of Stress : Not at all  Social Connections: Moderately Isolated (02/09/2022)   Social Connection and Isolation Panel    Frequency of Communication with Friends and Family: More than three times a week    Frequency of Social Gatherings with Friends and Family: More than three times a week    Attends Religious Services: Never    Database administrator or Organizations: No    Attends Banker Meetings: Never    Marital Status: Married  Catering manager Violence: Not At Risk (02/09/2022)   Humiliation, Afraid, Rape, and Kick questionnaire    Fear of Current or Ex-Partner: No    Emotionally Abused: No    Physically Abused: No    Sexually Abused: No     Family History:  The patient's family history includes Diabetes in her mother; Hypertension in her mother; Stroke in her mother.    ROS:  Please see the history of present illness.  All other ROS reviewed and negative.     Physical Exam/Data: Vitals:   09/07/23 1304 09/07/23 1310  BP:   (!) 176/64  Pulse: 79   Resp: 20   Temp: 97.8 F (36.6 C)   TempSrc: Oral   SpO2: 96%   Weight: 91.2 kg   Height: 5' 2.5 (1.588 m)    GEN: No acute distress.   Cardiac: PPM site well healed. Psych: Normal affect     Assessment and Plan:  #SND #PPM at [facility] for generator replacement.  Risks, benefits, and alternatives to pulse generator replacement were discussed in detail today.  The patient understands that risks include but are not limited to bleeding, infection, pneumothorax, perforation, tamponade, vascular damage, renal failure, MI, stroke, death, inappropriate shocks, damage to his existing leads, and lead dislodgement and wishes to proceed.  We will therefore schedule the procedure at the next available time.    Signed, [name], MD  09/07/2023 1:54 PM

## 2023-09-08 ENCOUNTER — Encounter (HOSPITAL_COMMUNITY): Payer: Self-pay | Admitting: Cardiology

## 2023-09-11 ENCOUNTER — Other Ambulatory Visit: Payer: Self-pay | Admitting: Family Medicine

## 2023-09-17 ENCOUNTER — Encounter: Admitting: Cardiology

## 2023-09-25 ENCOUNTER — Ambulatory Visit: Attending: Cardiology

## 2023-09-25 DIAGNOSIS — I495 Sick sinus syndrome: Secondary | ICD-10-CM | POA: Insufficient documentation

## 2023-09-25 NOTE — Patient Instructions (Signed)

## 2023-09-25 NOTE — Progress Notes (Signed)
 Normal dual chamber pacemaker wound check. Presenting rhythm: AS/AP/VS. Wound well healed. Routine testing performed. Thresholds, sensing, and impedance consistent with pre gen change measurements. One brief episode of V>A lasting 11 beats.  Pt enrolled in remote follow-up.

## 2023-09-26 ENCOUNTER — Ambulatory Visit

## 2023-10-15 ENCOUNTER — Other Ambulatory Visit: Payer: Self-pay | Admitting: Cardiology

## 2023-11-01 ENCOUNTER — Ambulatory Visit: Payer: Medicare Other

## 2023-11-01 DIAGNOSIS — I480 Paroxysmal atrial fibrillation: Secondary | ICD-10-CM | POA: Diagnosis not present

## 2023-11-02 LAB — CUP PACEART REMOTE DEVICE CHECK
Battery Remaining Longevity: 166 mo
Battery Voltage: 3.22 V
Brady Statistic AP VP Percent: 0.05 %
Brady Statistic AP VS Percent: 72.84 %
Brady Statistic AS VP Percent: 0.02 %
Brady Statistic AS VS Percent: 27.09 %
Brady Statistic RA Percent Paced: 72.79 %
Brady Statistic RV Percent Paced: 0.07 %
Date Time Interrogation Session: 20251008204850
Implantable Lead Connection Status: 753985
Implantable Lead Connection Status: 753985
Implantable Lead Implant Date: 20141208
Implantable Lead Implant Date: 20141208
Implantable Lead Location: 753859
Implantable Lead Location: 753860
Implantable Lead Model: 5076
Implantable Lead Model: 5076
Implantable Pulse Generator Implant Date: 20250815
Lead Channel Impedance Value: 342 Ohm
Lead Channel Impedance Value: 342 Ohm
Lead Channel Impedance Value: 399 Ohm
Lead Channel Impedance Value: 399 Ohm
Lead Channel Pacing Threshold Amplitude: 0.5 V
Lead Channel Pacing Threshold Amplitude: 0.625 V
Lead Channel Pacing Threshold Pulse Width: 0.4 ms
Lead Channel Pacing Threshold Pulse Width: 0.4 ms
Lead Channel Sensing Intrinsic Amplitude: 2.75 mV
Lead Channel Sensing Intrinsic Amplitude: 2.75 mV
Lead Channel Sensing Intrinsic Amplitude: 6 mV
Lead Channel Sensing Intrinsic Amplitude: 6 mV
Lead Channel Setting Pacing Amplitude: 1.5 V
Lead Channel Setting Pacing Amplitude: 2 V
Lead Channel Setting Pacing Pulse Width: 0.4 ms
Lead Channel Setting Sensing Sensitivity: 1.2 mV
Zone Setting Status: 755011

## 2023-11-02 NOTE — Progress Notes (Signed)
 Remote PPM Transmission

## 2023-11-05 ENCOUNTER — Ambulatory Visit: Payer: Self-pay | Admitting: Cardiology

## 2023-11-06 ENCOUNTER — Telehealth: Payer: Self-pay | Admitting: *Deleted

## 2023-11-06 DIAGNOSIS — E1159 Type 2 diabetes mellitus with other circulatory complications: Secondary | ICD-10-CM

## 2023-11-06 DIAGNOSIS — E559 Vitamin D deficiency, unspecified: Secondary | ICD-10-CM

## 2023-11-06 DIAGNOSIS — E785 Hyperlipidemia, unspecified: Secondary | ICD-10-CM

## 2023-11-06 DIAGNOSIS — Z79899 Other long term (current) drug therapy: Secondary | ICD-10-CM

## 2023-11-06 NOTE — Telephone Encounter (Signed)
-----   Message from Veva JINNY Ferrari sent at 11/06/2023  2:47 PM EDT ----- Regarding: Lab orders for Mon, 10.27.25 Patient is scheduled for CPX labs, please order future labs, Thanks , Veva

## 2023-11-07 NOTE — Progress Notes (Signed)
 Remote PPM Transmission

## 2023-11-19 ENCOUNTER — Other Ambulatory Visit

## 2023-11-19 DIAGNOSIS — E785 Hyperlipidemia, unspecified: Secondary | ICD-10-CM | POA: Diagnosis not present

## 2023-11-19 DIAGNOSIS — E1159 Type 2 diabetes mellitus with other circulatory complications: Secondary | ICD-10-CM

## 2023-11-19 DIAGNOSIS — Z79899 Other long term (current) drug therapy: Secondary | ICD-10-CM

## 2023-11-19 DIAGNOSIS — E559 Vitamin D deficiency, unspecified: Secondary | ICD-10-CM

## 2023-11-19 LAB — CBC WITH DIFFERENTIAL/PLATELET
Basophils Absolute: 0.1 K/uL (ref 0.0–0.1)
Basophils Relative: 1.2 % (ref 0.0–3.0)
Eosinophils Absolute: 0.1 K/uL (ref 0.0–0.7)
Eosinophils Relative: 1.9 % (ref 0.0–5.0)
HCT: 43.9 % (ref 36.0–46.0)
Hemoglobin: 14.4 g/dL (ref 12.0–15.0)
Lymphocytes Relative: 37.2 % (ref 12.0–46.0)
Lymphs Abs: 2.2 K/uL (ref 0.7–4.0)
MCHC: 32.7 g/dL (ref 30.0–36.0)
MCV: 91.1 fl (ref 78.0–100.0)
Monocytes Absolute: 0.5 K/uL (ref 0.1–1.0)
Monocytes Relative: 9 % (ref 3.0–12.0)
Neutro Abs: 3 K/uL (ref 1.4–7.7)
Neutrophils Relative %: 50.7 % (ref 43.0–77.0)
Platelets: 254 K/uL (ref 150.0–400.0)
RBC: 4.81 Mil/uL (ref 3.87–5.11)
RDW: 13 % (ref 11.5–15.5)
WBC: 5.9 K/uL (ref 4.0–10.5)

## 2023-11-19 LAB — MICROALBUMIN / CREATININE URINE RATIO
Creatinine,U: 84.4 mg/dL
Microalb Creat Ratio: 24 mg/g (ref 0.0–30.0)
Microalb, Ur: 2 mg/dL — ABNORMAL HIGH (ref 0.0–1.9)

## 2023-11-19 LAB — LIPID PANEL
Cholesterol: 246 mg/dL — ABNORMAL HIGH (ref 0–200)
HDL: 44.4 mg/dL (ref >39.00)
LDL Cholesterol: 153 mg/dL — ABNORMAL HIGH (ref 0–99)
NonHDL: 201.98
Total CHOL/HDL Ratio: 6
Triglycerides: 247 mg/dL — ABNORMAL HIGH (ref 0.0–149.0)
VLDL: 49.4 mg/dL — ABNORMAL HIGH (ref 0.0–40.0)

## 2023-11-19 LAB — BASIC METABOLIC PANEL WITH GFR
BUN: 23 mg/dL (ref 6–23)
CO2: 29 meq/L (ref 19–32)
Calcium: 9.7 mg/dL (ref 8.4–10.5)
Chloride: 100 meq/L (ref 96–112)
Creatinine, Ser: 1.2 mg/dL (ref 0.40–1.20)
GFR: 43.42 mL/min — ABNORMAL LOW (ref >60.00)
Glucose, Bld: 140 mg/dL — ABNORMAL HIGH (ref 70–99)
Potassium: 5.2 meq/L — ABNORMAL HIGH (ref 3.5–5.1)
Sodium: 137 meq/L (ref 135–145)

## 2023-11-19 LAB — VITAMIN D 25 HYDROXY (VIT D DEFICIENCY, FRACTURES): VITD: 21.91 ng/mL — ABNORMAL LOW (ref 30.00–100.00)

## 2023-11-19 LAB — HEPATIC FUNCTION PANEL
ALT: 14 U/L (ref 0–35)
AST: 13 U/L (ref 0–37)
Albumin: 4.4 g/dL (ref 3.5–5.2)
Alkaline Phosphatase: 94 U/L (ref 39–117)
Bilirubin, Direct: 0.1 mg/dL (ref 0.0–0.3)
Total Bilirubin: 0.5 mg/dL (ref 0.2–1.2)
Total Protein: 6.8 g/dL (ref 6.0–8.3)

## 2023-11-19 LAB — HEMOGLOBIN A1C: Hgb A1c MFr Bld: 7.1 % — ABNORMAL HIGH (ref 4.6–6.5)

## 2023-11-19 LAB — TSH: TSH: 3.09 u[IU]/mL (ref 0.35–5.50)

## 2023-11-25 NOTE — Progress Notes (Unsigned)
 Daryan Cagley T. Lititia Sen, MD, CAQ Sports Medicine [facility] [address] [place], [address]  Phone: [phone]  FAX: [phone]  Nina Carpenter - 78 y.o. female  MRN [id number]  Date of Birth: 07-14-1945  Date: 11/26/2023  PCP: Watt Mirza, MD  Referral: Watt Mirza, MD  No chief complaint on file.  Subjective:   Nina Carpenter is a 78 y.o. very pleasant female patient with There is no height or weight on file to calculate BMI. who presents with the following:  Discussed the use of AI scribe software for clinical note transcription with the patient, who gave verbal consent to proceed.  Resident is here to follow-up on multiple medical problems.  History of stroke.  Diabetes Mellitus: Tolerating Medications: yes Compliance with diet: fair, There is no height or weight on file to calculate BMI. Exercise: minimal / intermittent Avg blood sugars at home: not checking Foot problems: none Hypoglycemia: none No nausea, vomitting, blurred vision, polyuria.  Lab Results  Component Value Date   HGBA1C 7.1 (H) 11/19/2023   HGBA1C 6.7 (A) 05/23/2023   HGBA1C 7.0 (H) 02/19/2023   Lab Results  Component Value Date   MICROALBUR 2.0 (H) 11/19/2023   LDLCALC 153 (H) 11/19/2023   CREATININE 1.20 11/19/2023    Wt Readings from Last 3 Encounters:  09/07/23 201 lb (91.2 kg)  08/13/23 201 lb 9.6 oz (91.4 kg)  05/23/23 193 lb 2 oz (87.6 kg)    HTN: Tolerating all medications without side effects Stable and at goal No CP, no sob. No HA.  BP Readings from Last 3 Encounters:  09/07/23 (!) 156/61  08/13/23 (!) 142/84  05/23/23 (!) 165/80    Basic Metabolic Panel:    Component Value Date/Time   NA 137 11/19/2023 0817   NA 139 08/13/2023 1043   NA 139 05/09/2013 2023   K 5.2 (H) 11/19/2023 0817   K 3.9 05/09/2013 2023   CL 100 11/19/2023 0817   CL 103 05/09/2013 2023   CO2 29 11/19/2023 0817   CO2 29 05/09/2013 2023    BUN 23 11/19/2023 0817   BUN 20 08/13/2023 1043   BUN 14 05/09/2013 2023   CREATININE 1.20 11/19/2023 0817   CREATININE 0.97 05/09/2013 2023   GLUCOSE 140 (H) 11/19/2023 0817   GLUCOSE 141 (H) 05/09/2013 2023   CALCIUM  9.7 11/19/2023 0817   CALCIUM  9.5 05/09/2013 2023    Lipids: Doing well, stable. Tolerating meds fine with no SE. Panel reviewed with patient.  Lipids: Lab Results  Component Value Date   CHOL 246 (H) 11/19/2023   Lab Results  Component Value Date   HDL 44.40 11/19/2023   Lab Results  Component Value Date   LDLCALC 153 (H) 11/19/2023   Lab Results  Component Value Date   TRIG 247.0 (H) 11/19/2023   Lab Results  Component Value Date   CHOLHDL 6 11/19/2023    Lab Results  Component Value Date   ALT 14 11/19/2023   AST 13 11/19/2023   ALKPHOS 94 11/19/2023   BILITOT 0.5 11/19/2023    History of Present Illness     Review of Systems is noted in the HPI, as appropriate  Objective:   There were no vitals taken for this visit.  GEN: No acute distress; alert,appropriate. PULM: Breathing comfortably in no respiratory distress PSYCH: Normally interactive.   Laboratory and Imaging Data:  Assessment and Plan:     ICD-10-CM   1. Type  2 diabetes mellitus with vascular disease (HCC)  E11.59     2. Paroxysmal atrial fibrillation (HCC)  I48.0     3. History of stroke  Z86.73     4. NEURALGIA, TRIGEMINAL  G50.0     5. Hyperlipidemia LDL goal <70  E78.5     6. Essential hypertension  I10     7. Cardiac pacemaker in situ  Z95.0      Assessment & Plan   Medication Management during today's office visit: No orders of the defined types were placed in this encounter.  There are no discontinued medications.  Orders placed today for conditions managed today: No orders of the defined types were placed in this encounter.   Disposition: No follow-ups on file.  [employer] One speech-to-text software was used for transcription in this  dictation.  Possible transcriptional errors can occur using Animal nutritionist.   Signed,  Jacques DASEN. Chong January, MD   Outpatient Encounter Medications as of 11/26/2023  Medication Sig   Accu-Chek FastClix Lancets MISC USE TO CHECK BLOOD SUGAR ONCE DAILY   amLODipine  (NORVASC ) 2.5 MG tablet TAKE 1 TABLET(2.5 MG) BY MOUTH DAILY   apixaban  (ELIQUIS ) 5 MG TABS tablet Take 1 tablet (5 mg total) by mouth 2 (two) times daily.   Blood Glucose Calibration (ACCU-CHEK GUIDE CONTROL) LIQD 1 each by In Vitro route as directed.   Blood Glucose Monitoring Suppl (ACCU-CHEK GUIDE ME) w/Device KIT 1 each by Does not apply route every 14 (fourteen) days. Use to check FBS and 2 HR PP once every 2 weeks   fexofenadine (ALLEGRA) 180 MG tablet Take 180 mg by mouth daily as needed for allergies.   furosemide  (LASIX ) 20 MG tablet TAKE 1 TABLET(20 MG) BY MOUTH 1 TIME EVERY OTHER DAY (Patient taking differently: Take 20 mg by mouth daily as needed for fluid.)   glipiZIDE  (GLIPIZIDE  XL) 10 MG 24 hr tablet Take 1 tablet (10 mg total) by mouth daily with breakfast.   glucose blood (ACCU-CHEK GUIDE) test strip USE TO CHECK BLOOD SUGAR ONCE DAILY   hydrochlorothiazide  (HYDRODIURIL ) 25 MG tablet Take 1 tablet (25 mg total) by mouth daily. (Patient taking differently: Take 25 mg by mouth daily as needed (Fluid).)   Lancets Misc. (ACCU-CHEK FASTCLIX LANCET) KIT Use to check blood sugar once daily   losartan  (COZAAR ) 100 MG tablet TAKE 1 TABLET(100 MG) BY MOUTH DAILY   metFORMIN  (GLUCOPHAGE -XR) 500 MG 24 hr tablet Take 4 tablets (2,000 mg total) by mouth daily with breakfast.   metoprolol  tartrate (LOPRESSOR ) 100 MG tablet Take 1 tablet (100 mg total) by mouth 2 (two) times daily.   mometasone  (NASONEX ) 50 MCG/ACT nasal spray SHAKE LIQUID AND USE 2 SPRAYS IN EACH NOSTRIL DAILY AS NEEDED   montelukast  (SINGULAIR ) 10 MG tablet Take 1 tablet (10 mg total) by mouth at bedtime as needed.   rosuvastatin  (CRESTOR ) 5 MG tablet Take 1 tablet  (5 mg total) by mouth daily.   triamcinolone  cream (KENALOG ) 0.5 % Apply topically 2 (two) times daily. (Patient taking differently: Apply 1 Application topically 2 (two) times daily as needed (irritation).)   No facility-administered encounter medications on file as of 11/26/2023.

## 2023-11-26 ENCOUNTER — Ambulatory Visit: Admitting: Family Medicine

## 2023-11-26 ENCOUNTER — Encounter: Payer: Self-pay | Admitting: Family Medicine

## 2023-11-26 VITALS — BP 144/88 | HR 87 | Temp 97.7°F | Ht 62.5 in | Wt 201.0 lb

## 2023-11-26 DIAGNOSIS — E785 Hyperlipidemia, unspecified: Secondary | ICD-10-CM | POA: Diagnosis not present

## 2023-11-26 DIAGNOSIS — Z8673 Personal history of transient ischemic attack (TIA), and cerebral infarction without residual deficits: Secondary | ICD-10-CM

## 2023-11-26 DIAGNOSIS — G500 Trigeminal neuralgia: Secondary | ICD-10-CM | POA: Diagnosis not present

## 2023-11-26 DIAGNOSIS — Z7984 Long term (current) use of oral hypoglycemic drugs: Secondary | ICD-10-CM

## 2023-11-26 DIAGNOSIS — E1159 Type 2 diabetes mellitus with other circulatory complications: Secondary | ICD-10-CM | POA: Diagnosis not present

## 2023-11-26 DIAGNOSIS — Z950 Presence of cardiac pacemaker: Secondary | ICD-10-CM | POA: Diagnosis not present

## 2023-11-26 DIAGNOSIS — I480 Paroxysmal atrial fibrillation: Secondary | ICD-10-CM

## 2023-11-26 DIAGNOSIS — I10 Essential (primary) hypertension: Secondary | ICD-10-CM | POA: Diagnosis not present

## 2023-11-26 MED ORDER — ROSUVASTATIN CALCIUM 5 MG PO TABS: 5.0000 mg | ORAL_TABLET | Freq: Every day | ORAL | 3 refills | Status: AC

## 2023-11-26 MED ORDER — AMLODIPINE BESYLATE 5 MG PO TABS: 5.0000 mg | ORAL_TABLET | Freq: Every day | ORAL | 3 refills | Status: AC

## 2023-11-26 NOTE — Patient Instructions (Signed)
Increase Amlodipine to 5 mg a day

## 2023-11-27 ENCOUNTER — Telehealth: Payer: Self-pay | Admitting: Family Medicine

## 2023-11-27 ENCOUNTER — Telehealth: Payer: Self-pay | Admitting: Cardiology

## 2023-11-27 DIAGNOSIS — I480 Paroxysmal atrial fibrillation: Secondary | ICD-10-CM

## 2023-11-27 MED ORDER — APIXABAN 5 MG PO TABS: 5.0000 mg | ORAL_TABLET | Freq: Two times a day (BID) | ORAL | 5 refills | Status: DC

## 2023-11-27 NOTE — Telephone Encounter (Signed)
 Copied from CRM #[id number]. Topic: Clinical - Prescription Issue >> Nov 27, 2023 12:43 PM Pinkey ORN wrote: Reason for CRM: apixaban  (ELIQUIS ) 5 MG TABS tablet >> Nov 27, 2023 12:44 PM Pinkey ORN wrote: Patient called, states she doesn't have anymore refills on her apixaban  (ELIQUIS ) 5 MG TABS tablet. Please follow up and update the patient.

## 2023-11-27 NOTE — Telephone Encounter (Signed)
 Prescription refill request for Eliquis  received. Indication:  A-Fib Last office visit:  08/13/2023 Scr:  1.20   last labs on 11/19/2023 Age:  78 yrs. Weight: 91.2 kg  Pt meets protocol and pt's medication was sent to pt's pharmacy for 6 mos.

## 2023-11-27 NOTE — Telephone Encounter (Signed)
*  STAT* If patient is at the pharmacy, call can be transferred to refill team.   1. Which medications need to be refilled? (please list name of each medication and dose if known)   apixaban  (ELIQUIS ) 5 MG TABS tablet    2. Which pharmacy/location (including street and city if local pharmacy) is medication to be sent to? [id number] - [place], [place] - [address] AT [facility] [address] Phone: [phone]  Fax: [phone]      3. Do they need a 30 day or 90 day supply? 30

## 2023-11-27 NOTE — Telephone Encounter (Signed)
 This medication is filled by the pt's cardiologist. Advised her that she would need to contact their office for this refill. Pt verbalized understanding.

## 2023-12-10 NOTE — Progress Notes (Unsigned)
  Electrophysiology Office Follow up Visit Note:    Date:  12/12/2023   ID:  Nina Carpenter, DOB 06/07/1945, MRN [id number]  PCP:  Watt Mirza, MD  [facility] Cardiologist:  None  [facility] Electrophysiologist:  [name], MD    Interval History:     Nina Carpenter is a 78 y.o. female who presents for a follow up visit.   She had a generator change September 07, 2023. She is with her husband today in clinic.  No complaints.        Past medical, surgical, social and family history were reviewed.  ROS:   Please see the history of present illness.    All other systems reviewed and are negative.  EKGs/Labs/Other Studies Reviewed:    The following studies were reviewed today:  December 12, 2023 in-clinic device interrogation personally reviewed Battery and lead parameter stable        Physical Exam:    VS:  BP (!) 160/76   Pulse 73   Ht 5' 2.5 (1.588 m)   Wt 200 lb (90.7 kg)   SpO2 98%   BMI 36.00 kg/m     Wt Readings from Last 3 Encounters:  12/12/23 200 lb (90.7 kg)  11/26/23 201 lb (91.2 kg)  09/07/23 201 lb (91.2 kg)     GEN: no distress CARD: RRR, No MRG.  Generator pocket well-healed RESP: No IWOB. CTAB.      ASSESSMENT:    1. Paroxysmal atrial fibrillation (HCC)   2. Sinus node dysfunction (HCC)   3. Cardiac pacemaker in situ    PLAN:    In order of problems listed above:  #Sinus node dysfunction #Permanent pacemaker in situ Doing well after recent generator replacement.  Incision has healed well  #Atrial fibrillation Continue Eliquis  for stroke prophylaxis  #Hypertension Above goal today.  She takes her pressures at home and they are well-controlled.  She tells me that they are frequently elevated in the physicians offices.  Recommend checking blood pressures 1-2 times per week at home and recording the values.  Recommend bringing these recordings to the primary care physician.  I discussed my upcoming  departure from [facility] during today's clinic appointment.  She will continue to follow-up with one of our partners moving forward.  Follow-up 1 year with EP APP    Signed, Ole Holts, MD, [facility], [facility] 12/12/2023 10:32 AM    Electrophysiology [facility]

## 2023-12-12 ENCOUNTER — Ambulatory Visit: Payer: Self-pay | Admitting: Cardiology

## 2023-12-12 ENCOUNTER — Encounter: Payer: Self-pay | Admitting: Cardiology

## 2023-12-12 ENCOUNTER — Ambulatory Visit: Attending: Cardiology | Admitting: Cardiology

## 2023-12-12 VITALS — BP 160/76 | HR 73 | Ht 62.5 in | Wt 200.0 lb

## 2023-12-12 DIAGNOSIS — I495 Sick sinus syndrome: Secondary | ICD-10-CM | POA: Diagnosis present

## 2023-12-12 DIAGNOSIS — Z950 Presence of cardiac pacemaker: Secondary | ICD-10-CM | POA: Insufficient documentation

## 2023-12-12 DIAGNOSIS — I480 Paroxysmal atrial fibrillation: Secondary | ICD-10-CM | POA: Diagnosis present

## 2023-12-12 LAB — CUP PACEART INCLINIC DEVICE CHECK
Date Time Interrogation Session: 20251119104145
Implantable Lead Connection Status: 753985
Implantable Lead Connection Status: 753985
Implantable Lead Implant Date: 20141208
Implantable Lead Implant Date: 20141208
Implantable Lead Location: 753859
Implantable Lead Location: 753860
Implantable Lead Model: 5076
Implantable Lead Model: 5076
Implantable Pulse Generator Implant Date: 20250815
Lead Channel Impedance Value: 418 Ohm
Lead Channel Impedance Value: 456 Ohm
Lead Channel Pacing Threshold Amplitude: 0.5 V
Lead Channel Pacing Threshold Amplitude: 0.5 V
Lead Channel Pacing Threshold Pulse Width: 0.4 ms
Lead Channel Pacing Threshold Pulse Width: 0.4 ms
Lead Channel Sensing Intrinsic Amplitude: 1.3 mV
Lead Channel Sensing Intrinsic Amplitude: 9 mV

## 2023-12-12 NOTE — Patient Instructions (Signed)

## 2024-01-03 ENCOUNTER — Other Ambulatory Visit: Payer: Self-pay

## 2024-01-03 DIAGNOSIS — I480 Paroxysmal atrial fibrillation: Secondary | ICD-10-CM

## 2024-01-03 MED ORDER — APIXABAN 5 MG PO TABS: 5.0000 mg | ORAL_TABLET | Freq: Two times a day (BID) | ORAL | 5 refills | Status: AC

## 2024-01-14 ENCOUNTER — Telehealth: Payer: Self-pay | Admitting: *Deleted

## 2024-01-14 MED ORDER — OSELTAMIVIR PHOSPHATE 75 MG PO CAPS: 75.0000 mg | ORAL_CAPSULE | Freq: Every day | ORAL | 0 refills | Status: AC

## 2024-01-14 NOTE — Telephone Encounter (Signed)
 Copied from CRM #[id number]. Topic: Clinical - Medication Question >> Jan 14, 2024 12:50 PM China J wrote: Reason for CRM: The patient would like to know if Dr.[name] could prescribed tamiflu . Her husband tested positive for influenza and she wants to take safe measures since Christmas is coming up.  Please call the patient at [phone].

## 2024-01-14 NOTE — Telephone Encounter (Signed)
 Rosie notified by telephone that an Rx for Tamiflu  75 mg prevention has been sent to her pharmacy.

## 2024-01-14 NOTE — Telephone Encounter (Signed)
 Ok to send

## 2024-01-14 NOTE — Telephone Encounter (Signed)
 Okay to send in Tamiflu  Prevention?

## 2024-01-31 ENCOUNTER — Ambulatory Visit: Payer: Medicare Other

## 2024-01-31 DIAGNOSIS — I480 Paroxysmal atrial fibrillation: Secondary | ICD-10-CM | POA: Diagnosis not present

## 2024-02-01 LAB — CUP PACEART REMOTE DEVICE CHECK
Battery Remaining Longevity: 162 mo
Battery Voltage: 3.2 V
Brady Statistic AP VP Percent: 0.03 %
Brady Statistic AP VS Percent: 74.5 %
Brady Statistic AS VP Percent: 0.02 %
Brady Statistic AS VS Percent: 25.45 %
Brady Statistic RA Percent Paced: 74.39 %
Brady Statistic RV Percent Paced: 0.05 %
Date Time Interrogation Session: 20260107184455
Implantable Lead Connection Status: 753985
Implantable Lead Connection Status: 753985
Implantable Lead Implant Date: 20141208
Implantable Lead Implant Date: 20141208
Implantable Lead Location: 753859
Implantable Lead Location: 753860
Implantable Lead Model: 5076
Implantable Lead Model: 5076
Implantable Pulse Generator Implant Date: 20250815
Lead Channel Impedance Value: 361 Ohm
Lead Channel Impedance Value: 361 Ohm
Lead Channel Impedance Value: 399 Ohm
Lead Channel Impedance Value: 399 Ohm
Lead Channel Pacing Threshold Amplitude: 0.5 V
Lead Channel Pacing Threshold Amplitude: 0.625 V
Lead Channel Pacing Threshold Pulse Width: 0.4 ms
Lead Channel Pacing Threshold Pulse Width: 0.4 ms
Lead Channel Sensing Intrinsic Amplitude: 1.5 mV
Lead Channel Sensing Intrinsic Amplitude: 1.5 mV
Lead Channel Sensing Intrinsic Amplitude: 6 mV
Lead Channel Sensing Intrinsic Amplitude: 6 mV
Lead Channel Setting Pacing Amplitude: 1.5 V
Lead Channel Setting Pacing Amplitude: 2 V
Lead Channel Setting Pacing Pulse Width: 0.4 ms
Lead Channel Setting Sensing Sensitivity: 1.2 mV
Zone Setting Status: 755011

## 2024-02-02 ENCOUNTER — Ambulatory Visit: Payer: Self-pay | Admitting: Cardiology

## 2024-02-05 NOTE — Progress Notes (Signed)
 Remote PPM Transmission

## 2024-05-28 ENCOUNTER — Encounter: Admitting: Family Medicine

## 2024-06-20 ENCOUNTER — Ambulatory Visit
